# Patient Record
Sex: Female | Born: 1935 | Race: White | Hispanic: No | Smoking: Never smoker
Health system: Southern US, Community
[De-identification: ages and names within clinical notes are randomized; demographics above are authoritative.]

## PROBLEM LIST (undated history)

## (undated) DIAGNOSIS — J309 Allergic rhinitis, unspecified: Secondary | ICD-10-CM

## (undated) DIAGNOSIS — R300 Dysuria: Secondary | ICD-10-CM

## (undated) DIAGNOSIS — F329 Major depressive disorder, single episode, unspecified: Secondary | ICD-10-CM

## (undated) DIAGNOSIS — N3281 Overactive bladder: Secondary | ICD-10-CM

## (undated) DIAGNOSIS — K1379 Other lesions of oral mucosa: Secondary | ICD-10-CM

## (undated) DIAGNOSIS — F319 Bipolar disorder, unspecified: Secondary | ICD-10-CM

## (undated) DIAGNOSIS — E612 Magnesium deficiency: Secondary | ICD-10-CM

## (undated) DIAGNOSIS — L719 Rosacea, unspecified: Secondary | ICD-10-CM

## (undated) DIAGNOSIS — J189 Pneumonia, unspecified organism: Secondary | ICD-10-CM

## (undated) DIAGNOSIS — E1142 Type 2 diabetes mellitus with diabetic polyneuropathy: Secondary | ICD-10-CM

## (undated) DIAGNOSIS — E46 Unspecified protein-calorie malnutrition: Secondary | ICD-10-CM

## (undated) DIAGNOSIS — N3946 Mixed incontinence: Secondary | ICD-10-CM

## (undated) DIAGNOSIS — B379 Candidiasis, unspecified: Secondary | ICD-10-CM

## (undated) DIAGNOSIS — M1460 Charcot's joint, unspecified site: Secondary | ICD-10-CM

## (undated) DIAGNOSIS — I10 Essential (primary) hypertension: Secondary | ICD-10-CM

## (undated) DIAGNOSIS — A419 Sepsis, unspecified organism: Secondary | ICD-10-CM

## (undated) DIAGNOSIS — M6281 Muscle weakness (generalized): Secondary | ICD-10-CM

## (undated) DIAGNOSIS — K5901 Slow transit constipation: Secondary | ICD-10-CM

## (undated) DIAGNOSIS — G629 Polyneuropathy, unspecified: Secondary | ICD-10-CM

## (undated) DIAGNOSIS — E785 Hyperlipidemia, unspecified: Secondary | ICD-10-CM

## (undated) DIAGNOSIS — N390 Urinary tract infection, site not specified: Secondary | ICD-10-CM

## (undated) DIAGNOSIS — E54 Ascorbic acid deficiency: Secondary | ICD-10-CM

## (undated) DIAGNOSIS — N952 Postmenopausal atrophic vaginitis: Secondary | ICD-10-CM

## (undated) DIAGNOSIS — M792 Neuralgia and neuritis, unspecified: Secondary | ICD-10-CM

## (undated) DIAGNOSIS — K5909 Other constipation: Secondary | ICD-10-CM

## (undated) DIAGNOSIS — E569 Vitamin deficiency, unspecified: Secondary | ICD-10-CM

## (undated) DIAGNOSIS — Z794 Long term (current) use of insulin: Secondary | ICD-10-CM

## (undated) DIAGNOSIS — G20 Parkinson's disease: Secondary | ICD-10-CM

## (undated) DIAGNOSIS — G9340 Encephalopathy, unspecified: Secondary | ICD-10-CM

## (undated) DIAGNOSIS — Z9181 History of falling: Secondary | ICD-10-CM

## (undated) DIAGNOSIS — G20A1 Parkinson's disease without dyskinesia, without mention of fluctuations: Secondary | ICD-10-CM

## (undated) DIAGNOSIS — K219 Gastro-esophageal reflux disease without esophagitis: Secondary | ICD-10-CM

## (undated) DIAGNOSIS — E669 Obesity, unspecified: Secondary | ICD-10-CM

## (undated) HISTORY — DX: Charcot's joint, unspecified site: M14.60

## (undated) HISTORY — DX: Major depressive disorder, single episode, unspecified: F32.9

## (undated) HISTORY — DX: Hyperlipidemia, unspecified: E78.5

## (undated) HISTORY — DX: Morbid (severe) obesity due to excess calories: E66.01

## (undated) HISTORY — DX: Postmenopausal atrophic vaginitis: N95.2

## (undated) HISTORY — DX: Sepsis, unspecified organism: A41.9

## (undated) HISTORY — DX: Ascorbic acid deficiency: E54

## (undated) HISTORY — DX: Other constipation: K59.09

## (undated) HISTORY — DX: Gastro-esophageal reflux disease without esophagitis: K21.9

## (undated) HISTORY — DX: Neuralgia and neuritis, unspecified: M79.2

## (undated) HISTORY — DX: Muscle weakness (generalized): M62.81

## (undated) HISTORY — DX: Urinary tract infection, site not specified: N39.0

## (undated) HISTORY — DX: Type 2 diabetes mellitus with diabetic polyneuropathy: E11.42

## (undated) HISTORY — DX: Encephalopathy, unspecified: G93.40

## (undated) HISTORY — DX: Mixed incontinence: N39.46

## (undated) HISTORY — DX: Candidiasis, unspecified: B37.9

## (undated) HISTORY — DX: Essential (primary) hypertension: I10

## (undated) HISTORY — DX: History of falling: Z91.81

## (undated) HISTORY — DX: Parkinson's disease: G20

## (undated) HISTORY — DX: Pneumonia, unspecified organism: J18.9

## (undated) HISTORY — DX: Magnesium deficiency: E61.2

## (undated) HISTORY — DX: Parkinson's disease without dyskinesia, without mention of fluctuations: G20.A1

## (undated) HISTORY — DX: Polyneuropathy, unspecified: G62.9

## (undated) HISTORY — DX: Unspecified protein-calorie malnutrition: E46

## (undated) HISTORY — DX: Allergic rhinitis, unspecified: J30.9

## (undated) HISTORY — DX: Vitamin deficiency, unspecified: E56.9

## (undated) HISTORY — PX: CERVICAL CONIZATION W/BX: SHX1330

## (undated) HISTORY — DX: Obesity, unspecified: E66.9

## (undated) HISTORY — PX: CHOLECYSTECTOMY: SHX55

## (undated) HISTORY — DX: Bipolar disorder, unspecified: F31.9

## (undated) HISTORY — DX: Dysuria: R30.0

## (undated) HISTORY — PX: REVISION TOTAL KNEE ARTHROPLASTY: SHX767

## (undated) HISTORY — DX: Slow transit constipation: K59.01

## (undated) HISTORY — PX: ABDOMINAL HYSTERECTOMY: SHX81

## (undated) HISTORY — DX: Overactive bladder: N32.81

## (undated) HISTORY — DX: Other lesions of oral mucosa: K13.79

## (undated) HISTORY — DX: Long term (current) use of insulin: Z79.4

## (undated) HISTORY — DX: Rosacea, unspecified: L71.9

---

## 2005-06-10 ENCOUNTER — Ambulatory Visit: Payer: Self-pay

## 2005-09-05 ENCOUNTER — Ambulatory Visit: Payer: Self-pay | Admitting: Internal Medicine

## 2006-01-06 ENCOUNTER — Other Ambulatory Visit: Payer: Self-pay

## 2006-01-09 ENCOUNTER — Ambulatory Visit: Payer: Self-pay | Admitting: Unknown Physician Specialty

## 2006-01-31 ENCOUNTER — Inpatient Hospital Stay: Payer: Self-pay | Admitting: General Practice

## 2006-02-04 ENCOUNTER — Encounter: Payer: Self-pay | Admitting: Internal Medicine

## 2006-02-24 ENCOUNTER — Encounter: Payer: Self-pay | Admitting: Internal Medicine

## 2006-08-24 ENCOUNTER — Encounter: Payer: Self-pay | Admitting: Unknown Physician Specialty

## 2006-08-26 ENCOUNTER — Encounter: Payer: Self-pay | Admitting: Unknown Physician Specialty

## 2006-09-12 ENCOUNTER — Ambulatory Visit: Payer: Self-pay | Admitting: Internal Medicine

## 2006-09-26 ENCOUNTER — Encounter: Payer: Self-pay | Admitting: Unknown Physician Specialty

## 2006-10-27 ENCOUNTER — Encounter: Payer: Self-pay | Admitting: Unknown Physician Specialty

## 2006-11-25 ENCOUNTER — Encounter: Payer: Self-pay | Admitting: Unknown Physician Specialty

## 2007-08-27 ENCOUNTER — Ambulatory Visit: Payer: Self-pay | Admitting: Otolaryngology

## 2008-01-22 ENCOUNTER — Ambulatory Visit: Payer: Self-pay | Admitting: Internal Medicine

## 2008-06-12 ENCOUNTER — Other Ambulatory Visit: Payer: Self-pay

## 2008-06-12 ENCOUNTER — Emergency Department: Payer: Self-pay | Admitting: Emergency Medicine

## 2008-07-03 ENCOUNTER — Ambulatory Visit: Payer: Self-pay | Admitting: Internal Medicine

## 2010-03-09 ENCOUNTER — Ambulatory Visit: Payer: Self-pay | Admitting: Internal Medicine

## 2010-05-03 ENCOUNTER — Ambulatory Visit: Payer: Self-pay | Admitting: Unknown Physician Specialty

## 2010-07-12 ENCOUNTER — Ambulatory Visit: Payer: Self-pay | Admitting: Unknown Physician Specialty

## 2011-05-18 ENCOUNTER — Ambulatory Visit: Payer: Self-pay | Admitting: Internal Medicine

## 2011-07-06 ENCOUNTER — Ambulatory Visit: Payer: Self-pay | Admitting: Ophthalmology

## 2012-01-30 LAB — URINALYSIS, COMPLETE
Glucose,UR: NEGATIVE mg/dL (ref 0–75)
Ketone: NEGATIVE
Nitrite: NEGATIVE
Squamous Epithelial: 5
WBC UR: 149 /HPF (ref 0–5)

## 2012-01-30 LAB — CBC
HCT: 41.1 % (ref 35.0–47.0)
Platelet: 293 10*3/uL (ref 150–440)
RBC: 4.51 10*6/uL (ref 3.80–5.20)
WBC: 15.9 10*3/uL — ABNORMAL HIGH (ref 3.6–11.0)

## 2012-01-30 LAB — COMPREHENSIVE METABOLIC PANEL
Alkaline Phosphatase: 93 U/L (ref 50–136)
Anion Gap: 10 (ref 7–16)
BUN: 15 mg/dL (ref 7–18)
Calcium, Total: 9.2 mg/dL (ref 8.5–10.1)
Creatinine: 0.95 mg/dL (ref 0.60–1.30)
EGFR (African American): >60
EGFR (Non-African Amer.): 59 — ABNORMAL LOW
Glucose: 93 mg/dL (ref 65–99)
Osmolality: 282 (ref 275–301)
Potassium: 3.6 mmol/L (ref 3.5–5.1)
SGPT (ALT): 23 U/L
Sodium: 141 mmol/L (ref 136–145)
Total Protein: 7.6 g/dL (ref 6.4–8.2)

## 2012-01-31 ENCOUNTER — Inpatient Hospital Stay: Payer: Self-pay | Admitting: Internal Medicine

## 2012-02-01 LAB — BASIC METABOLIC PANEL
Anion Gap: 7 (ref 7–16)
Chloride: 107 mmol/L (ref 98–107)
Creatinine: 0.78 mg/dL (ref 0.60–1.30)
EGFR (African American): >60
EGFR (Non-African Amer.): >60
Osmolality: 283 (ref 275–301)

## 2012-02-01 LAB — CBC WITH DIFFERENTIAL/PLATELET
Basophil #: 0.1 10*3/uL (ref 0.0–0.1)
Basophil %: 0.4 %
Eosinophil %: 2.5 %
HGB: 12.7 g/dL (ref 12.0–16.0)
Lymphocyte #: 2.5 10*3/uL (ref 1.0–3.6)
Lymphocyte %: 19.2 %
MCH: 30.4 pg (ref 26.0–34.0)
MCHC: 33.4 g/dL (ref 32.0–36.0)
MCV: 91 fL (ref 80–100)
Monocyte #: 1.2 x10 3/mm — ABNORMAL HIGH (ref 0.2–0.9)
Neutrophil #: 9.1 10*3/uL — ABNORMAL HIGH (ref 1.4–6.5)
RBC: 4.16 10*6/uL (ref 3.80–5.20)
RDW: 13.2 % (ref 11.5–14.5)

## 2012-02-02 LAB — CBC WITH DIFFERENTIAL/PLATELET
Basophil #: 0.1 10*3/uL (ref 0.0–0.1)
Basophil %: 0.7 %
HGB: 12.4 g/dL (ref 12.0–16.0)
Lymphocyte #: 2.2 10*3/uL (ref 1.0–3.6)
Lymphocyte %: 23.6 %
MCV: 91 fL (ref 80–100)
Monocyte %: 9.7 %
Neutrophil %: 61.3 %
Platelet: 251 10*3/uL (ref 150–440)
RBC: 4.02 10*6/uL (ref 3.80–5.20)
RDW: 13.1 % (ref 11.5–14.5)
WBC: 9.2 10*3/uL (ref 3.6–11.0)

## 2012-02-05 LAB — CULTURE, BLOOD (SINGLE)

## 2012-04-11 ENCOUNTER — Ambulatory Visit: Payer: Self-pay | Admitting: Ophthalmology

## 2012-06-04 DIAGNOSIS — R6889 Other general symptoms and signs: Secondary | ICD-10-CM | POA: Diagnosis not present

## 2012-06-05 ENCOUNTER — Inpatient Hospital Stay: Payer: Self-pay | Admitting: Internal Medicine

## 2012-06-05 DIAGNOSIS — G9340 Encephalopathy, unspecified: Secondary | ICD-10-CM | POA: Diagnosis not present

## 2012-06-05 DIAGNOSIS — G219 Secondary parkinsonism, unspecified: Secondary | ICD-10-CM | POA: Diagnosis not present

## 2012-06-05 LAB — COMPREHENSIVE METABOLIC PANEL
Anion Gap: 8 (ref 7–16)
BUN: 14 mg/dL (ref 7–18)
Calcium, Total: 9 mg/dL (ref 8.5–10.1)
Chloride: 102 mmol/L (ref 98–107)
Co2: 26 mmol/L (ref 21–32)
EGFR (African American): >60
EGFR (Non-African Amer.): >60
Osmolality: 284 (ref 275–301)
Potassium: 3.9 mmol/L (ref 3.5–5.1)
SGPT (ALT): 25 U/L (ref 12–78)
Sodium: 136 mmol/L (ref 136–145)

## 2012-06-05 LAB — URINALYSIS, COMPLETE
Bilirubin,UR: NEGATIVE
Nitrite: POSITIVE
Ph: 5 (ref 4.5–8.0)
Protein: 30
RBC,UR: 11 /HPF (ref 0–5)
Specific Gravity: 1.022 (ref 1.003–1.030)
WBC UR: 222 /HPF (ref 0–5)

## 2012-06-05 LAB — CBC
HCT: 42.5 % (ref 35.0–47.0)
MCV: 90 fL (ref 80–100)
Platelet: 214 10*3/uL (ref 150–440)
RBC: 4.73 10*6/uL (ref 3.80–5.20)
RDW: 13.4 % (ref 11.5–14.5)
WBC: 11.4 10*3/uL — ABNORMAL HIGH (ref 3.6–11.0)

## 2012-06-05 LAB — CK TOTAL AND CKMB (NOT AT ARMC): CK-MB: <0.5 ng/mL — ABNORMAL LOW (ref 0.5–3.6)

## 2012-06-05 LAB — DRUG SCREEN, URINE
Benzodiazepine, Ur Scrn: NEGATIVE (ref ?–200)
Cannabinoid 50 Ng, Ur ~~LOC~~: NEGATIVE (ref ?–50)
Cocaine Metabolite,Ur ~~LOC~~: NEGATIVE (ref ?–300)
Methadone, Ur Screen: NEGATIVE (ref ?–300)
Phencyclidine (PCP) Ur S: NEGATIVE (ref ?–25)

## 2012-06-05 LAB — TROPONIN I: Troponin-I: <0.02 ng/mL

## 2012-06-05 LAB — PROTIME-INR
INR: 1
Prothrombin Time: 13.4 secs (ref 11.5–14.7)

## 2012-06-05 LAB — AMMONIA: Ammonia, Plasma: <25 mcmol/L (ref 11–32)

## 2012-06-05 LAB — SEDIMENTATION RATE: Erythrocyte Sed Rate: 17 mm/hr (ref 0–30)

## 2012-06-05 LAB — ETHANOL: Ethanol %: <0.003 % (ref 0.000–0.080)

## 2012-06-05 LAB — TSH: Thyroid Stimulating Horm: 0.732 u[IU]/mL

## 2012-06-05 LAB — FOLATE: Folic Acid: 16.8 ng/mL (ref 3.1–100.0)

## 2012-06-06 DIAGNOSIS — G9340 Encephalopathy, unspecified: Secondary | ICD-10-CM | POA: Diagnosis not present

## 2012-06-06 DIAGNOSIS — G219 Secondary parkinsonism, unspecified: Secondary | ICD-10-CM | POA: Diagnosis not present

## 2012-06-06 LAB — CBC WITH DIFFERENTIAL/PLATELET
Basophil #: 0.1 10*3/uL (ref 0.0–0.1)
Basophil %: 0.7 %
Eosinophil #: 0.2 10*3/uL (ref 0.0–0.7)
Eosinophil %: 1.9 %
HCT: 42.1 % (ref 35.0–47.0)
HGB: 14.6 g/dL (ref 12.0–16.0)
Lymphocyte #: 2.1 10*3/uL (ref 1.0–3.6)
Lymphocyte %: 20.3 %
MCH: 31 pg (ref 26.0–34.0)
Monocyte #: 1.5 x10 3/mm — ABNORMAL HIGH (ref 0.2–0.9)
Monocyte %: 14.6 %
Neutrophil %: 62.5 %
Platelet: 224 10*3/uL (ref 150–440)
RBC: 4.71 10*6/uL (ref 3.80–5.20)
RDW: 13.6 % (ref 11.5–14.5)
WBC: 10.4 10*3/uL (ref 3.6–11.0)

## 2012-06-06 LAB — BASIC METABOLIC PANEL
Anion Gap: 10 (ref 7–16)
BUN: 16 mg/dL (ref 7–18)
Calcium, Total: 8.8 mg/dL (ref 8.5–10.1)
EGFR (Non-African Amer.): >60
Glucose: 232 mg/dL — ABNORMAL HIGH (ref 65–99)
Osmolality: 284 (ref 275–301)
Potassium: 3.5 mmol/L (ref 3.5–5.1)

## 2012-06-07 DIAGNOSIS — G219 Secondary parkinsonism, unspecified: Secondary | ICD-10-CM | POA: Diagnosis not present

## 2012-06-07 DIAGNOSIS — G9340 Encephalopathy, unspecified: Secondary | ICD-10-CM | POA: Diagnosis not present

## 2012-06-07 LAB — URINE CULTURE

## 2012-06-10 LAB — CULTURE, BLOOD (SINGLE)

## 2012-08-28 ENCOUNTER — Ambulatory Visit: Payer: Self-pay | Admitting: Ophthalmology

## 2012-12-27 ENCOUNTER — Ambulatory Visit: Payer: Self-pay | Admitting: General Practice

## 2013-03-22 ENCOUNTER — Inpatient Hospital Stay: Payer: Self-pay | Admitting: Internal Medicine

## 2013-03-22 DIAGNOSIS — R6889 Other general symptoms and signs: Secondary | ICD-10-CM | POA: Diagnosis not present

## 2013-03-22 LAB — COMPREHENSIVE METABOLIC PANEL
Anion Gap: 12 (ref 7–16)
Bilirubin,Total: 0.9 mg/dL (ref 0.2–1.0)
Calcium, Total: 9.7 mg/dL (ref 8.5–10.1)
Creatinine: 0.72 mg/dL (ref 0.60–1.30)
EGFR (African American): >60
EGFR (Non-African Amer.): >60
SGOT(AST): 96 U/L — ABNORMAL HIGH (ref 15–37)
SGPT (ALT): 51 U/L (ref 12–78)
Sodium: 143 mmol/L (ref 136–145)
Total Protein: 7.2 g/dL (ref 6.4–8.2)

## 2013-03-22 LAB — TROPONIN I: Troponin-I: <0.02 ng/mL

## 2013-03-22 LAB — DRUG SCREEN, URINE
Amphetamines, Ur Screen: NEGATIVE (ref ?–1000)
Cannabinoid 50 Ng, Ur ~~LOC~~: NEGATIVE (ref ?–50)
Cocaine Metabolite,Ur ~~LOC~~: NEGATIVE (ref ?–300)
MDMA (Ecstasy)Ur Screen: NEGATIVE (ref ?–500)
Methadone, Ur Screen: NEGATIVE (ref ?–300)
Opiate, Ur Screen: NEGATIVE (ref ?–300)
Phencyclidine (PCP) Ur S: NEGATIVE (ref ?–25)

## 2013-03-22 LAB — CBC WITH DIFFERENTIAL/PLATELET
Basophil #: 0.2 10*3/uL — ABNORMAL HIGH (ref 0.0–0.1)
Eosinophil #: 0.1 10*3/uL (ref 0.0–0.7)
Eosinophil %: 0.7 %
HCT: 47.9 % — ABNORMAL HIGH (ref 35.0–47.0)
Lymphocyte #: 1.9 10*3/uL (ref 1.0–3.6)
Lymphocyte %: 11.7 %
MCH: 30.3 pg (ref 26.0–34.0)
MCHC: 33.4 g/dL (ref 32.0–36.0)
MCV: 91 fL (ref 80–100)
Monocyte %: 10.5 %
Neutrophil #: 12.7 10*3/uL — ABNORMAL HIGH (ref 1.4–6.5)
Neutrophil %: 76.2 %
Platelet: 340 10*3/uL (ref 150–440)
RBC: 5.29 10*6/uL — ABNORMAL HIGH (ref 3.80–5.20)

## 2013-03-22 LAB — URINALYSIS, COMPLETE
Ph: 5 (ref 4.5–8.0)
Protein: NEGATIVE
Squamous Epithelial: 2
WBC UR: 8 /HPF (ref 0–5)

## 2013-03-22 LAB — CK TOTAL AND CKMB (NOT AT ARMC): CK-MB: 10 ng/mL — ABNORMAL HIGH (ref 0.5–3.6)

## 2013-03-22 LAB — ETHANOL
Ethanol %: <0.003 % (ref 0.000–0.080)
Ethanol: <3 mg/dL

## 2013-03-22 LAB — APTT: Activated PTT: 28 secs (ref 23.6–35.9)

## 2013-03-22 LAB — AMMONIA: Ammonia, Plasma: 45 mcmol/L — ABNORMAL HIGH (ref 11–32)

## 2013-03-23 LAB — COMPREHENSIVE METABOLIC PANEL
Albumin: 2.8 g/dL — ABNORMAL LOW (ref 3.4–5.0)
Alkaline Phosphatase: 104 U/L (ref 50–136)
Anion Gap: 6 — ABNORMAL LOW (ref 7–16)
BUN: 18 mg/dL (ref 7–18)
Bilirubin,Total: 0.7 mg/dL (ref 0.2–1.0)
Calcium, Total: 8.6 mg/dL (ref 8.5–10.1)
Co2: 29 mmol/L (ref 21–32)
Creatinine: 0.77 mg/dL (ref 0.60–1.30)
Osmolality: 299 (ref 275–301)
Total Protein: 6.6 g/dL (ref 6.4–8.2)

## 2013-03-23 LAB — CK TOTAL AND CKMB (NOT AT ARMC)
CK, Total: 1581 U/L — ABNORMAL HIGH (ref 21–215)
CK-MB: 6 ng/mL — ABNORMAL HIGH (ref 0.5–3.6)
CK-MB: 9.3 ng/mL — ABNORMAL HIGH (ref 0.5–3.6)

## 2013-03-23 LAB — CBC WITH DIFFERENTIAL/PLATELET
Basophil #: 0.2 10*3/uL — ABNORMAL HIGH (ref 0.0–0.1)
Basophil %: 1.4 %
Eosinophil #: 0.2 10*3/uL (ref 0.0–0.7)
Eosinophil %: 1 %
HCT: 43.7 % (ref 35.0–47.0)
HGB: 14.7 g/dL (ref 12.0–16.0)
Lymphocyte #: 2.2 10*3/uL (ref 1.0–3.6)
Lymphocyte %: 13.5 %
MCH: 30.4 pg (ref 26.0–34.0)
MCHC: 33.8 g/dL (ref 32.0–36.0)
MCV: 90 fL (ref 80–100)
Monocyte %: 11.1 %
Neutrophil #: 12 10*3/uL — ABNORMAL HIGH (ref 1.4–6.5)
Neutrophil %: 73 %
Platelet: 289 10*3/uL (ref 150–440)
RBC: 4.85 10*6/uL (ref 3.80–5.20)

## 2013-03-23 LAB — TROPONIN I
Troponin-I: <0.02 ng/mL
Troponin-I: <0.02 ng/mL

## 2013-03-23 LAB — TSH: Thyroid Stimulating Horm: 0.864 u[IU]/mL

## 2013-03-23 LAB — MAGNESIUM: Magnesium: 1.8 mg/dL

## 2013-03-23 LAB — HEMOGLOBIN A1C: Hemoglobin A1C: 9 % — ABNORMAL HIGH (ref 4.2–6.3)

## 2013-03-24 LAB — COMPREHENSIVE METABOLIC PANEL
Albumin: 2.6 g/dL — ABNORMAL LOW (ref 3.4–5.0)
Anion Gap: 8 (ref 7–16)
BUN: 18 mg/dL (ref 7–18)
Bilirubin,Total: 0.5 mg/dL (ref 0.2–1.0)
Calcium, Total: 9 mg/dL (ref 8.5–10.1)
Chloride: 103 mmol/L (ref 98–107)
Creatinine: 0.83 mg/dL (ref 0.60–1.30)
EGFR (African American): >60
Glucose: 207 mg/dL — ABNORMAL HIGH (ref 65–99)
Osmolality: 287 (ref 275–301)
Potassium: 3.2 mmol/L — ABNORMAL LOW (ref 3.5–5.1)
SGOT(AST): 41 U/L — ABNORMAL HIGH (ref 15–37)
Total Protein: 6.1 g/dL — ABNORMAL LOW (ref 6.4–8.2)

## 2013-03-24 LAB — CBC WITH DIFFERENTIAL/PLATELET
Basophil #: 0.1 10*3/uL (ref 0.0–0.1)
Basophil %: 0.9 %
Eosinophil %: 4.4 %
HCT: 40.4 % (ref 35.0–47.0)
HGB: 13.7 g/dL (ref 12.0–16.0)
Lymphocyte #: 2.1 10*3/uL (ref 1.0–3.6)
MCHC: 33.9 g/dL (ref 32.0–36.0)
Monocyte #: 1.5 x10 3/mm — ABNORMAL HIGH (ref 0.2–0.9)
Neutrophil #: 9.5 10*3/uL — ABNORMAL HIGH (ref 1.4–6.5)
Platelet: 266 10*3/uL (ref 150–440)
RBC: 4.52 10*6/uL (ref 3.80–5.20)

## 2013-03-24 LAB — CK: CK, Total: 713 U/L — ABNORMAL HIGH (ref 21–215)

## 2013-03-25 LAB — BASIC METABOLIC PANEL
Anion Gap: 7 (ref 7–16)
BUN: 13 mg/dL (ref 7–18)
EGFR (African American): >60
Osmolality: 285 (ref 275–301)
Potassium: 3.3 mmol/L — ABNORMAL LOW (ref 3.5–5.1)
Sodium: 141 mmol/L (ref 136–145)

## 2013-03-25 LAB — URINE CULTURE

## 2013-03-26 LAB — CBC WITH DIFFERENTIAL/PLATELET
Basophil %: 0.3 %
HGB: 13.9 g/dL (ref 12.0–16.0)
Lymphocyte #: 2.6 10*3/uL (ref 1.0–3.6)
Lymphocyte %: 21.8 %
MCH: 31 pg (ref 26.0–34.0)
MCHC: 35 g/dL (ref 32.0–36.0)
Monocyte #: 1.1 x10 3/mm — ABNORMAL HIGH (ref 0.2–0.9)
Monocyte %: 9.7 %
Neutrophil %: 62.6 %
Platelet: 283 10*3/uL (ref 150–440)
RDW: 13 % (ref 11.5–14.5)

## 2013-03-26 LAB — URINE CULTURE

## 2013-03-27 DIAGNOSIS — R6889 Other general symptoms and signs: Secondary | ICD-10-CM | POA: Diagnosis not present

## 2013-03-27 LAB — BASIC METABOLIC PANEL
Anion Gap: 6 — ABNORMAL LOW (ref 7–16)
BUN: 17 mg/dL (ref 7–18)
Calcium, Total: 9.3 mg/dL (ref 8.5–10.1)
Co2: 29 mmol/L (ref 21–32)
Creatinine: 0.71 mg/dL (ref 0.60–1.30)
EGFR (Non-African Amer.): >60
Osmolality: 285 (ref 275–301)
Potassium: 3.8 mmol/L (ref 3.5–5.1)

## 2013-03-27 LAB — CULTURE, BLOOD (SINGLE)

## 2013-03-28 DIAGNOSIS — Z0000 Encounter for general adult medical examination without abnormal findings: Secondary | ICD-10-CM | POA: Diagnosis not present

## 2013-03-28 LAB — CULTURE, BLOOD (SINGLE)

## 2013-03-31 LAB — CULTURE, BLOOD (SINGLE)

## 2013-04-26 ENCOUNTER — Encounter (HOSPITAL_BASED_OUTPATIENT_CLINIC_OR_DEPARTMENT_OTHER): Payer: Medicare Other | Attending: General Surgery

## 2013-04-26 DIAGNOSIS — L8994 Pressure ulcer of unspecified site, stage 4: Secondary | ICD-10-CM | POA: Insufficient documentation

## 2013-04-26 DIAGNOSIS — I10 Essential (primary) hypertension: Secondary | ICD-10-CM | POA: Insufficient documentation

## 2013-04-26 DIAGNOSIS — E119 Type 2 diabetes mellitus without complications: Secondary | ICD-10-CM | POA: Insufficient documentation

## 2013-04-26 DIAGNOSIS — Z794 Long term (current) use of insulin: Secondary | ICD-10-CM | POA: Insufficient documentation

## 2013-04-26 DIAGNOSIS — L89109 Pressure ulcer of unspecified part of back, unspecified stage: Secondary | ICD-10-CM | POA: Insufficient documentation

## 2013-04-26 LAB — GLUCOSE, CAPILLARY

## 2013-04-27 NOTE — Progress Notes (Signed)
[facility]  NAME:  Maureen Taylor, Maureen Taylor               ACCOUNT NO.:  [id number]  MEDICAL RECORD NO.:  [id number]      DATE OF BIRTH:  1935-01-23  PHYSICIAN:  Ardath Sax, M.D.           VISIT DATE:                                  OFFICE VISIT   [name] is coming to the [facility] for the first time because of a unfortunate pressure ulcer on her sacrum.  This lady is 78 years of age and weighs about 300 pounds.  Apparently, she fell in the shower and hit her head and ended up laying in the shower for 4 days before she was discovered.  She was later taken to the emergency room and then admitted to the hospital for another 4-5 days.  During that time, she was worked up and apparently they did not find any neurologic problems.  However, it is obvious she has a very large stage IV pressure sore on her sacrum. I debrided it today and I felt like it needed some Santyl, so we put in some Santyl and I am ordering her to have a wound VAC as I think this is the only hope to make this situation better in terms of the size.  I can put my whole fist into this pressure ulcer and it goes right down to the presacral fascia.  She had normal vital signs; however, she is afebrile.  Her blood pressure is 137/74, pulse 84.  She says she weighs 240 pounds and is 5 foot and 4 inches.  Her blood sugar today was 148.  The Medicines that she takes are glipizide, Cymbalta, Claritin, losartan, metoprolol, vitamins, NovoLog insulin, Protonix, Sinemet, vitamins, and Ambien for sleep.  So, her diagnoses are 1. Severe pressure sore, stage IV of the sacrum. 2. Type 2 diabetes. 3. Hypertension. 4. Morbid obesity.     Ardath Sax, M.D.     PP/MEDQ  D:  04/26/2013  T:  04/27/2013  Job:  [id number]

## 2013-04-27 NOTE — Progress Notes (Signed)
[facility]  NAME:  Maureen, Taylor               ACCOUNT NO.:  [id number]  MEDICAL RECORD NO.:  [id number]      DATE OF BIRTH:  10-May-1935  PHYSICIAN:  Ardath Sax, M.D.           VISIT DATE:                                  OFFICE VISIT   This is a 78 year old lady who  Dictation ended at this point.     Ardath Sax, M.D.     PP/MEDQ  D:  04/26/2013  T:  04/27/2013  Job:  [id number]

## 2013-05-27 DIAGNOSIS — R488 Other symbolic dysfunctions: Secondary | ICD-10-CM | POA: Diagnosis not present

## 2013-05-27 DIAGNOSIS — H905 Unspecified sensorineural hearing loss: Secondary | ICD-10-CM | POA: Diagnosis not present

## 2013-05-27 DIAGNOSIS — I10 Essential (primary) hypertension: Secondary | ICD-10-CM | POA: Diagnosis not present

## 2013-05-27 DIAGNOSIS — M6282 Rhabdomyolysis: Secondary | ICD-10-CM | POA: Diagnosis not present

## 2013-05-27 DIAGNOSIS — L89109 Pressure ulcer of unspecified part of back, unspecified stage: Secondary | ICD-10-CM | POA: Diagnosis not present

## 2013-05-27 DIAGNOSIS — R1310 Dysphagia, unspecified: Secondary | ICD-10-CM | POA: Diagnosis not present

## 2013-05-27 DIAGNOSIS — L8994 Pressure ulcer of unspecified site, stage 4: Secondary | ICD-10-CM | POA: Diagnosis not present

## 2013-05-27 DIAGNOSIS — E119 Type 2 diabetes mellitus without complications: Secondary | ICD-10-CM | POA: Diagnosis not present

## 2013-05-27 DIAGNOSIS — M6281 Muscle weakness (generalized): Secondary | ICD-10-CM | POA: Diagnosis not present

## 2013-05-27 DIAGNOSIS — M259 Joint disorder, unspecified: Secondary | ICD-10-CM | POA: Diagnosis not present

## 2013-05-27 DIAGNOSIS — K219 Gastro-esophageal reflux disease without esophagitis: Secondary | ICD-10-CM | POA: Diagnosis not present

## 2013-05-27 DIAGNOSIS — R262 Difficulty in walking, not elsewhere classified: Secondary | ICD-10-CM | POA: Diagnosis not present

## 2013-05-27 DIAGNOSIS — N390 Urinary tract infection, site not specified: Secondary | ICD-10-CM | POA: Diagnosis not present

## 2013-05-27 DIAGNOSIS — H6120 Impacted cerumen, unspecified ear: Secondary | ICD-10-CM | POA: Diagnosis not present

## 2013-05-29 DIAGNOSIS — E119 Type 2 diabetes mellitus without complications: Secondary | ICD-10-CM | POA: Diagnosis not present

## 2013-05-29 DIAGNOSIS — L89109 Pressure ulcer of unspecified part of back, unspecified stage: Secondary | ICD-10-CM | POA: Diagnosis not present

## 2013-05-29 DIAGNOSIS — L8994 Pressure ulcer of unspecified site, stage 4: Secondary | ICD-10-CM | POA: Diagnosis not present

## 2013-06-05 DIAGNOSIS — E119 Type 2 diabetes mellitus without complications: Secondary | ICD-10-CM | POA: Diagnosis not present

## 2013-06-05 DIAGNOSIS — L89109 Pressure ulcer of unspecified part of back, unspecified stage: Secondary | ICD-10-CM | POA: Diagnosis not present

## 2013-06-05 DIAGNOSIS — L8994 Pressure ulcer of unspecified site, stage 4: Secondary | ICD-10-CM | POA: Diagnosis not present

## 2013-06-06 DIAGNOSIS — E119 Type 2 diabetes mellitus without complications: Secondary | ICD-10-CM | POA: Diagnosis not present

## 2013-06-06 DIAGNOSIS — K219 Gastro-esophageal reflux disease without esophagitis: Secondary | ICD-10-CM | POA: Diagnosis not present

## 2013-06-06 DIAGNOSIS — M259 Joint disorder, unspecified: Secondary | ICD-10-CM | POA: Diagnosis not present

## 2013-06-06 DIAGNOSIS — I10 Essential (primary) hypertension: Secondary | ICD-10-CM | POA: Diagnosis not present

## 2013-06-12 DIAGNOSIS — E119 Type 2 diabetes mellitus without complications: Secondary | ICD-10-CM | POA: Diagnosis not present

## 2013-06-12 DIAGNOSIS — L8994 Pressure ulcer of unspecified site, stage 4: Secondary | ICD-10-CM | POA: Diagnosis not present

## 2013-06-12 DIAGNOSIS — L89109 Pressure ulcer of unspecified part of back, unspecified stage: Secondary | ICD-10-CM | POA: Diagnosis not present

## 2013-06-24 DIAGNOSIS — E119 Type 2 diabetes mellitus without complications: Secondary | ICD-10-CM | POA: Diagnosis not present

## 2013-06-24 DIAGNOSIS — I10 Essential (primary) hypertension: Secondary | ICD-10-CM | POA: Diagnosis not present

## 2013-06-24 DIAGNOSIS — M259 Joint disorder, unspecified: Secondary | ICD-10-CM | POA: Diagnosis not present

## 2013-06-24 DIAGNOSIS — K219 Gastro-esophageal reflux disease without esophagitis: Secondary | ICD-10-CM | POA: Diagnosis not present

## 2013-06-26 DIAGNOSIS — L89109 Pressure ulcer of unspecified part of back, unspecified stage: Secondary | ICD-10-CM | POA: Diagnosis not present

## 2013-06-26 DIAGNOSIS — L8994 Pressure ulcer of unspecified site, stage 4: Secondary | ICD-10-CM | POA: Diagnosis not present

## 2013-06-26 DIAGNOSIS — E119 Type 2 diabetes mellitus without complications: Secondary | ICD-10-CM | POA: Diagnosis not present

## 2013-07-03 DIAGNOSIS — L8994 Pressure ulcer of unspecified site, stage 4: Secondary | ICD-10-CM | POA: Diagnosis not present

## 2013-07-03 DIAGNOSIS — E119 Type 2 diabetes mellitus without complications: Secondary | ICD-10-CM | POA: Diagnosis not present

## 2013-07-03 DIAGNOSIS — L89109 Pressure ulcer of unspecified part of back, unspecified stage: Secondary | ICD-10-CM | POA: Diagnosis not present

## 2013-07-15 DIAGNOSIS — E119 Type 2 diabetes mellitus without complications: Secondary | ICD-10-CM | POA: Diagnosis not present

## 2013-07-15 DIAGNOSIS — L89109 Pressure ulcer of unspecified part of back, unspecified stage: Secondary | ICD-10-CM | POA: Diagnosis not present

## 2013-07-15 DIAGNOSIS — L8994 Pressure ulcer of unspecified site, stage 4: Secondary | ICD-10-CM | POA: Diagnosis not present

## 2013-07-20 DIAGNOSIS — H905 Unspecified sensorineural hearing loss: Secondary | ICD-10-CM | POA: Diagnosis not present

## 2013-07-20 DIAGNOSIS — H6120 Impacted cerumen, unspecified ear: Secondary | ICD-10-CM | POA: Diagnosis not present

## 2013-07-22 DIAGNOSIS — L89109 Pressure ulcer of unspecified part of back, unspecified stage: Secondary | ICD-10-CM | POA: Diagnosis not present

## 2013-07-22 DIAGNOSIS — L8994 Pressure ulcer of unspecified site, stage 4: Secondary | ICD-10-CM | POA: Diagnosis not present

## 2013-07-22 DIAGNOSIS — E119 Type 2 diabetes mellitus without complications: Secondary | ICD-10-CM | POA: Diagnosis not present

## 2013-07-30 DIAGNOSIS — M259 Joint disorder, unspecified: Secondary | ICD-10-CM | POA: Diagnosis not present

## 2013-07-30 DIAGNOSIS — E119 Type 2 diabetes mellitus without complications: Secondary | ICD-10-CM | POA: Diagnosis not present

## 2013-07-30 DIAGNOSIS — K219 Gastro-esophageal reflux disease without esophagitis: Secondary | ICD-10-CM | POA: Diagnosis not present

## 2013-07-30 DIAGNOSIS — I10 Essential (primary) hypertension: Secondary | ICD-10-CM | POA: Diagnosis not present

## 2013-08-05 DIAGNOSIS — L89109 Pressure ulcer of unspecified part of back, unspecified stage: Secondary | ICD-10-CM | POA: Diagnosis not present

## 2013-08-05 DIAGNOSIS — E119 Type 2 diabetes mellitus without complications: Secondary | ICD-10-CM | POA: Diagnosis not present

## 2013-08-05 DIAGNOSIS — L8994 Pressure ulcer of unspecified site, stage 4: Secondary | ICD-10-CM | POA: Diagnosis not present

## 2013-09-10 DIAGNOSIS — J00 Acute nasopharyngitis [common cold]: Secondary | ICD-10-CM | POA: Diagnosis not present

## 2013-09-10 DIAGNOSIS — J309 Allergic rhinitis, unspecified: Secondary | ICD-10-CM | POA: Diagnosis not present

## 2013-09-10 DIAGNOSIS — J0100 Acute maxillary sinusitis, unspecified: Secondary | ICD-10-CM | POA: Diagnosis not present

## 2013-09-12 DIAGNOSIS — J019 Acute sinusitis, unspecified: Secondary | ICD-10-CM | POA: Diagnosis not present

## 2013-09-12 DIAGNOSIS — J3489 Other specified disorders of nose and nasal sinuses: Secondary | ICD-10-CM | POA: Diagnosis not present

## 2013-09-12 DIAGNOSIS — J329 Chronic sinusitis, unspecified: Secondary | ICD-10-CM | POA: Diagnosis not present

## 2013-09-13 DIAGNOSIS — N390 Urinary tract infection, site not specified: Secondary | ICD-10-CM | POA: Diagnosis not present

## 2013-09-13 DIAGNOSIS — E119 Type 2 diabetes mellitus without complications: Secondary | ICD-10-CM | POA: Diagnosis not present

## 2013-09-23 DIAGNOSIS — R32 Unspecified urinary incontinence: Secondary | ICD-10-CM | POA: Diagnosis not present

## 2013-10-01 DIAGNOSIS — F341 Dysthymic disorder: Secondary | ICD-10-CM | POA: Diagnosis not present

## 2013-10-07 DIAGNOSIS — A419 Sepsis, unspecified organism: Secondary | ICD-10-CM | POA: Diagnosis not present

## 2013-10-07 DIAGNOSIS — I10 Essential (primary) hypertension: Secondary | ICD-10-CM | POA: Diagnosis not present

## 2013-10-07 DIAGNOSIS — E119 Type 2 diabetes mellitus without complications: Secondary | ICD-10-CM | POA: Diagnosis not present

## 2013-10-07 DIAGNOSIS — E785 Hyperlipidemia, unspecified: Secondary | ICD-10-CM | POA: Diagnosis not present

## 2013-10-08 DIAGNOSIS — F341 Dysthymic disorder: Secondary | ICD-10-CM | POA: Diagnosis not present

## 2013-10-12 DIAGNOSIS — R32 Unspecified urinary incontinence: Secondary | ICD-10-CM | POA: Diagnosis not present

## 2013-10-15 DIAGNOSIS — F341 Dysthymic disorder: Secondary | ICD-10-CM | POA: Diagnosis not present

## 2013-10-22 DIAGNOSIS — F341 Dysthymic disorder: Secondary | ICD-10-CM | POA: Diagnosis not present

## 2013-10-22 DIAGNOSIS — N390 Urinary tract infection, site not specified: Secondary | ICD-10-CM | POA: Diagnosis not present

## 2013-10-29 DIAGNOSIS — F341 Dysthymic disorder: Secondary | ICD-10-CM | POA: Diagnosis not present

## 2014-01-20 DIAGNOSIS — N318 Other neuromuscular dysfunction of bladder: Secondary | ICD-10-CM | POA: Diagnosis not present

## 2014-01-20 DIAGNOSIS — N393 Stress incontinence (female) (male): Secondary | ICD-10-CM | POA: Diagnosis not present

## 2014-01-21 DIAGNOSIS — I498 Other specified cardiac arrhythmias: Secondary | ICD-10-CM | POA: Diagnosis not present

## 2014-01-21 DIAGNOSIS — I999 Unspecified disorder of circulatory system: Secondary | ICD-10-CM | POA: Diagnosis not present

## 2014-01-21 DIAGNOSIS — R7989 Other specified abnormal findings of blood chemistry: Secondary | ICD-10-CM | POA: Diagnosis not present

## 2014-01-21 DIAGNOSIS — R509 Fever, unspecified: Secondary | ICD-10-CM | POA: Diagnosis not present

## 2014-03-21 DIAGNOSIS — N318 Other neuromuscular dysfunction of bladder: Secondary | ICD-10-CM | POA: Diagnosis not present

## 2014-03-21 DIAGNOSIS — E119 Type 2 diabetes mellitus without complications: Secondary | ICD-10-CM | POA: Diagnosis not present

## 2014-03-21 DIAGNOSIS — J309 Allergic rhinitis, unspecified: Secondary | ICD-10-CM | POA: Diagnosis not present

## 2014-03-21 DIAGNOSIS — F329 Major depressive disorder, single episode, unspecified: Secondary | ICD-10-CM | POA: Diagnosis not present

## 2014-03-21 DIAGNOSIS — F3289 Other specified depressive episodes: Secondary | ICD-10-CM | POA: Diagnosis not present

## 2014-03-21 DIAGNOSIS — G20 Parkinson's disease: Secondary | ICD-10-CM | POA: Diagnosis not present

## 2014-03-21 DIAGNOSIS — K219 Gastro-esophageal reflux disease without esophagitis: Secondary | ICD-10-CM | POA: Diagnosis not present

## 2014-03-21 DIAGNOSIS — I10 Essential (primary) hypertension: Secondary | ICD-10-CM | POA: Diagnosis not present

## 2014-03-27 ENCOUNTER — Other Ambulatory Visit: Payer: Self-pay

## 2014-03-27 LAB — URINALYSIS, COMPLETE
BLOOD: NEGATIVE
Bilirubin,UR: NEGATIVE
NITRITE: NEGATIVE
Ph: 5 (ref 4.5–8.0)
RBC,UR: 14 /HPF (ref 0–5)
Specific Gravity: 1.011 (ref 1.003–1.030)
Squamous Epithelial: 2
Transitional Epi: 2

## 2014-03-30 LAB — URINE CULTURE

## 2014-04-02 DIAGNOSIS — R4182 Altered mental status, unspecified: Secondary | ICD-10-CM | POA: Diagnosis not present

## 2014-05-27 ENCOUNTER — Other Ambulatory Visit: Payer: Self-pay | Admitting: Family Medicine

## 2014-05-27 LAB — URINALYSIS, COMPLETE
BILIRUBIN, UR: NEGATIVE
Blood: NEGATIVE
Ketone: NEGATIVE
Nitrite: NEGATIVE
Ph: 6 (ref 4.5–8.0)
Protein: 30
RBC,UR: 6 /HPF (ref 0–5)
Specific Gravity: 1.012 (ref 1.003–1.030)
Squamous Epithelial: 2

## 2014-05-27 LAB — CBC WITH DIFFERENTIAL/PLATELET
BASOS ABS: 0.1 10*3/uL (ref 0.0–0.1)
Basophil %: 0.7 %
EOS PCT: 1.2 %
Eosinophil #: 0.2 10*3/uL (ref 0.0–0.7)
HCT: 47.3 % — AB (ref 35.0–47.0)
HGB: 15.1 g/dL (ref 12.0–16.0)
LYMPHS PCT: 10.1 %
Lymphocyte #: 1.5 10*3/uL (ref 1.0–3.6)
MCH: 30.2 pg (ref 26.0–34.0)
MCHC: 31.8 g/dL — AB (ref 32.0–36.0)
MCV: 95 fL (ref 80–100)
MONOS PCT: 5.9 %
Monocyte #: 0.9 x10 3/mm (ref 0.2–0.9)
NEUTROS ABS: 12.6 10*3/uL — AB (ref 1.4–6.5)
Neutrophil %: 82.1 %
Platelet: 256 10*3/uL (ref 150–440)
RBC: 4.98 10*6/uL (ref 3.80–5.20)
RDW: 13.5 % (ref 11.5–14.5)
WBC: 15.3 10*3/uL — AB (ref 3.6–11.0)

## 2014-05-27 LAB — TSH: Thyroid Stimulating Horm: 0.905 u[IU]/mL

## 2014-05-27 LAB — COMPREHENSIVE METABOLIC PANEL
ALK PHOS: 120 U/L — AB
Albumin: 2.9 g/dL — ABNORMAL LOW (ref 3.4–5.0)
Anion Gap: 9 (ref 7–16)
BUN: 8 mg/dL (ref 7–18)
Bilirubin,Total: 0.7 mg/dL (ref 0.2–1.0)
CALCIUM: 9.1 mg/dL (ref 8.5–10.1)
Chloride: 101 mmol/L (ref 98–107)
Co2: 26 mmol/L (ref 21–32)
Creatinine: 0.91 mg/dL (ref 0.60–1.30)
EGFR (African American): >60
EGFR (Non-African Amer.): >60
GLUCOSE: 276 mg/dL — AB (ref 65–99)
Osmolality: 280 (ref 275–301)
POTASSIUM: 3.9 mmol/L (ref 3.5–5.1)
SGOT(AST): 13 U/L — ABNORMAL LOW (ref 15–37)
SGPT (ALT): 8 U/L — ABNORMAL LOW
SODIUM: 136 mmol/L (ref 136–145)
Total Protein: 6.6 g/dL (ref 6.4–8.2)

## 2014-05-29 LAB — URINE CULTURE

## 2014-05-30 ENCOUNTER — Inpatient Hospital Stay: Payer: Self-pay | Admitting: Internal Medicine

## 2014-05-30 DIAGNOSIS — R5381 Other malaise: Secondary | ICD-10-CM | POA: Diagnosis not present

## 2014-05-30 DIAGNOSIS — F919 Conduct disorder, unspecified: Secondary | ICD-10-CM | POA: Diagnosis not present

## 2014-05-30 LAB — COMPREHENSIVE METABOLIC PANEL
ALK PHOS: 119 U/L — AB
Albumin: 2.7 g/dL — ABNORMAL LOW (ref 3.4–5.0)
Anion Gap: 8 (ref 7–16)
BILIRUBIN TOTAL: 0.7 mg/dL (ref 0.2–1.0)
BUN: 11 mg/dL (ref 7–18)
CALCIUM: 9.1 mg/dL (ref 8.5–10.1)
CHLORIDE: 102 mmol/L (ref 98–107)
Co2: 25 mmol/L (ref 21–32)
Creatinine: 1.02 mg/dL (ref 0.60–1.30)
EGFR (African American): >60
GFR CALC NON AF AMER: 53 — AB
Glucose: 309 mg/dL — ABNORMAL HIGH (ref 65–99)
Osmolality: 281 (ref 275–301)
Potassium: 4.8 mmol/L (ref 3.5–5.1)
SGOT(AST): 36 U/L (ref 15–37)
SGPT (ALT): 16 U/L
Sodium: 135 mmol/L — ABNORMAL LOW (ref 136–145)
TOTAL PROTEIN: 7.4 g/dL (ref 6.4–8.2)

## 2014-05-30 LAB — CBC WITH DIFFERENTIAL/PLATELET
BASOS ABS: 0.2 10*3/uL — AB (ref 0.0–0.1)
Basophil %: 0.7 %
EOS ABS: 0 10*3/uL (ref 0.0–0.7)
EOS PCT: 0.1 %
HCT: 42.5 % (ref 35.0–47.0)
HGB: 14 g/dL (ref 12.0–16.0)
Lymphocyte #: 1.9 10*3/uL (ref 1.0–3.6)
Lymphocyte %: 7.5 %
MCH: 30.1 pg (ref 26.0–34.0)
MCHC: 33.1 g/dL (ref 32.0–36.0)
MCV: 91 fL (ref 80–100)
MONOS PCT: 6.8 %
Monocyte #: 1.7 x10 3/mm — ABNORMAL HIGH (ref 0.2–0.9)
NEUTROS PCT: 84.9 %
Neutrophil #: 21.7 10*3/uL — ABNORMAL HIGH (ref 1.4–6.5)
Platelet: 268 10*3/uL (ref 150–440)
RBC: 4.66 10*6/uL (ref 3.80–5.20)
RDW: 13.7 % (ref 11.5–14.5)
WBC: 25.6 10*3/uL — ABNORMAL HIGH (ref 3.6–11.0)

## 2014-05-30 LAB — URINALYSIS, COMPLETE
BILIRUBIN, UR: NEGATIVE
Glucose,UR: >=500 mg/dL (ref 0–75)
Ketone: NEGATIVE
NITRITE: POSITIVE
Ph: 5 (ref 4.5–8.0)
Protein: 30
SPECIFIC GRAVITY: 1.014 (ref 1.003–1.030)
Squamous Epithelial: 3

## 2014-05-30 LAB — CK TOTAL AND CKMB (NOT AT ARMC)
CK, Total: 193 U/L — ABNORMAL HIGH
CK-MB: 0.6 ng/mL (ref 0.5–3.6)

## 2014-05-30 LAB — TROPONIN I: Troponin-I: <0.02 ng/mL

## 2014-05-31 LAB — BASIC METABOLIC PANEL
Anion Gap: 8 (ref 7–16)
BUN: 12 mg/dL (ref 7–18)
CO2: 27 mmol/L (ref 21–32)
CREATININE: 1.09 mg/dL (ref 0.60–1.30)
Calcium, Total: 8.4 mg/dL — ABNORMAL LOW (ref 8.5–10.1)
Chloride: 107 mmol/L (ref 98–107)
EGFR (African American): 57 — ABNORMAL LOW
EGFR (Non-African Amer.): 49 — ABNORMAL LOW
Glucose: 234 mg/dL — ABNORMAL HIGH (ref 65–99)
OSMOLALITY: 290 (ref 275–301)
Potassium: 3.7 mmol/L (ref 3.5–5.1)
Sodium: 142 mmol/L (ref 136–145)

## 2014-05-31 LAB — CBC WITH DIFFERENTIAL/PLATELET
BASOS PCT: 0.7 %
Basophil #: 0.1 10*3/uL (ref 0.0–0.1)
EOS ABS: 0.3 10*3/uL (ref 0.0–0.7)
EOS PCT: 1.6 %
HCT: 39.1 % (ref 35.0–47.0)
HGB: 13 g/dL (ref 12.0–16.0)
LYMPHS PCT: 13.6 %
Lymphocyte #: 2.1 10*3/uL (ref 1.0–3.6)
MCH: 30.6 pg (ref 26.0–34.0)
MCHC: 33.4 g/dL (ref 32.0–36.0)
MCV: 92 fL (ref 80–100)
Monocyte #: 1.5 x10 3/mm — ABNORMAL HIGH (ref 0.2–0.9)
Monocyte %: 9.9 %
NEUTROS PCT: 74.2 %
Neutrophil #: 11.4 10*3/uL — ABNORMAL HIGH (ref 1.4–6.5)
Platelet: 228 10*3/uL (ref 150–440)
RBC: 4.26 10*6/uL (ref 3.80–5.20)
RDW: 13.5 % (ref 11.5–14.5)
WBC: 15.4 10*3/uL — AB (ref 3.6–11.0)

## 2014-06-01 DIAGNOSIS — R5381 Other malaise: Secondary | ICD-10-CM | POA: Diagnosis not present

## 2014-06-01 DIAGNOSIS — N390 Urinary tract infection, site not specified: Secondary | ICD-10-CM | POA: Diagnosis not present

## 2014-06-01 DIAGNOSIS — R5383 Other fatigue: Secondary | ICD-10-CM | POA: Diagnosis not present

## 2014-06-01 LAB — CBC WITH DIFFERENTIAL/PLATELET
BASOS PCT: 0.8 %
Basophil #: 0.1 10*3/uL (ref 0.0–0.1)
EOS ABS: 0.6 10*3/uL (ref 0.0–0.7)
EOS PCT: 4.6 %
HCT: 38.8 % (ref 35.0–47.0)
HGB: 12.8 g/dL (ref 12.0–16.0)
LYMPHS ABS: 2.2 10*3/uL (ref 1.0–3.6)
Lymphocyte %: 16.5 %
MCH: 30.4 pg (ref 26.0–34.0)
MCHC: 33 g/dL (ref 32.0–36.0)
MCV: 92 fL (ref 80–100)
MONOS PCT: 10 %
Monocyte #: 1.3 x10 3/mm — ABNORMAL HIGH (ref 0.2–0.9)
NEUTROS PCT: 68.1 %
Neutrophil #: 9.2 10*3/uL — ABNORMAL HIGH (ref 1.4–6.5)
PLATELETS: 235 10*3/uL (ref 150–440)
RBC: 4.22 10*6/uL (ref 3.80–5.20)
RDW: 13.7 % (ref 11.5–14.5)
WBC: 13.5 10*3/uL — AB (ref 3.6–11.0)

## 2014-06-02 LAB — URINE CULTURE

## 2014-06-04 LAB — CULTURE, BLOOD (SINGLE)

## 2014-09-23 ENCOUNTER — Emergency Department: Payer: Self-pay | Admitting: Emergency Medicine

## 2014-10-01 ENCOUNTER — Ambulatory Visit: Payer: Self-pay

## 2014-10-01 LAB — URINALYSIS, COMPLETE
BLOOD: NEGATIVE
Bilirubin,UR: NEGATIVE
Glucose,UR: NEGATIVE mg/dL (ref 0–75)
Ketone: NEGATIVE
NITRITE: NEGATIVE
Ph: 5 (ref 4.5–8.0)
Protein: 100
RBC,UR: 21 /HPF (ref 0–5)
Specific Gravity: 1.019 (ref 1.003–1.030)

## 2014-10-03 LAB — URINE CULTURE

## 2014-10-29 ENCOUNTER — Ambulatory Visit: Payer: Self-pay

## 2014-10-30 ENCOUNTER — Ambulatory Visit: Payer: Self-pay

## 2014-10-30 LAB — URINALYSIS, COMPLETE
BILIRUBIN, UR: NEGATIVE
Blood: NEGATIVE
Glucose,UR: NEGATIVE mg/dL (ref 0–75)
Ketone: NEGATIVE
Nitrite: POSITIVE
PH: 5 (ref 4.5–8.0)
Protein: NEGATIVE
Specific Gravity: 1.017 (ref 1.003–1.030)

## 2014-11-03 LAB — URINE CULTURE

## 2015-01-02 DIAGNOSIS — S30810A Abrasion of lower back and pelvis, initial encounter: Secondary | ICD-10-CM | POA: Diagnosis not present

## 2015-01-02 DIAGNOSIS — W19XXXA Unspecified fall, initial encounter: Secondary | ICD-10-CM | POA: Diagnosis not present

## 2015-01-05 ENCOUNTER — Encounter: Payer: Self-pay | Admitting: Adult Health

## 2015-01-05 ENCOUNTER — Non-Acute Institutional Stay (SKILLED_NURSING_FACILITY): Payer: Medicare Other | Admitting: Adult Health

## 2015-01-05 DIAGNOSIS — K219 Gastro-esophageal reflux disease without esophagitis: Secondary | ICD-10-CM | POA: Diagnosis not present

## 2015-01-05 DIAGNOSIS — G20 Parkinson's disease: Secondary | ICD-10-CM | POA: Diagnosis not present

## 2015-01-05 DIAGNOSIS — J309 Allergic rhinitis, unspecified: Secondary | ICD-10-CM

## 2015-01-05 DIAGNOSIS — F3130 Bipolar disorder, current episode depressed, mild or moderate severity, unspecified: Secondary | ICD-10-CM | POA: Diagnosis not present

## 2015-01-05 DIAGNOSIS — B370 Candidal stomatitis: Secondary | ICD-10-CM | POA: Diagnosis not present

## 2015-01-05 DIAGNOSIS — I10 Essential (primary) hypertension: Secondary | ICD-10-CM | POA: Diagnosis not present

## 2015-01-05 DIAGNOSIS — G20A1 Parkinson's disease without dyskinesia, without mention of fluctuations: Secondary | ICD-10-CM

## 2015-01-05 DIAGNOSIS — E1140 Type 2 diabetes mellitus with diabetic neuropathy, unspecified: Secondary | ICD-10-CM | POA: Diagnosis not present

## 2015-01-05 DIAGNOSIS — G629 Polyneuropathy, unspecified: Secondary | ICD-10-CM | POA: Diagnosis not present

## 2015-01-05 DIAGNOSIS — B379 Candidiasis, unspecified: Secondary | ICD-10-CM | POA: Insufficient documentation

## 2015-01-05 DIAGNOSIS — F319 Bipolar disorder, unspecified: Secondary | ICD-10-CM | POA: Insufficient documentation

## 2015-01-05 NOTE — Progress Notes (Signed)
Patient ID: Maureen Taylor, female   DOB: [birth year], 80 y.o.   MRN: [id number]   01/05/2015  Facility:  Nursing Home Location:  [facility] Room Number: 901-1 LEVEL OF CARE:  SNF (31)   Chief Complaint  Patient presents with  . Hospitalization Follow-up    Bipolar depression, GERD, allergic rhinitis, hypertension, oral thrush, Parkinson's disease, neuropathy and diabetes mellitus    HISTORY OF PRESENT ILLNESS:  This is a 80 year old female who has been [facility]. She has PMH of hypertension, depression, GERD, allergic rhinitis, diabetes mellitus, Parkinson's disease and neuropathy.  She has been admitted for long-term care.  PAST MEDICAL HISTORY:   hypertension, depression, GERD, allergic rhinitis, diabetes mellitus, Parkinson's disease and neuropathy  CURRENT MEDICATIONS: Reviewed per MAR/see medication list  Allergies  Allergen Reactions  . Codeine   . Indocin [Indomethacin]   . Morphine And Related     REVIEW OF SYSTEMS:  GENERAL: no change in appetite, no fatigue, no weight changes, no fever, chills or weakness RESPIRATORY: no cough, SOB, DOE, wheezing, hemoptysis CARDIAC: no chest pain, edema or palpitations GI: no abdominal pain, diarrhea, constipation, heart burn, nausea or vomiting  PHYSICAL EXAMINATION  GENERAL: no acute distress, obese EYES: conjunctivae normal, sclerae normal, normal eye lids NECK: supple, trachea midline, no neck masses, no thyroid tenderness, no thyromegaly LYMPHATICS: no LAN in the neck, no supraclavicular LAN RESPIRATORY: breathing is even & unlabored, BS CTAB CARDIAC: RRR, no murmur,no extra heart sounds, no edema GI: abdomen soft, normal BS, no masses, no tenderness, no hepatomegaly, no splenomegaly, + abdominal hernia EXTREMITIES:  Able to move 4 extremities PSYCHIATRIC: the patient is alert & oriented to person, affect & behavior  appropriate  LABS/RADIOLOGY: 10/29/14  WBC 9.9 hemoglobin 13.8 hematocrit 42.0 MCV 92.9 10/28/14  sodium 138 potassium 4.2 glucose 259 BUN 18 creatinine 0.9 calcium 9.3 total protein 6.3 albumin 3.6 total bilirubin 0.4 ALP 101 AST 14 ALT 14 GFR >60 WBC 10.9 hemoglobin 13.6 hematocrit 41.4 MCV 93.9 10/24/14  magnesium 2.0  ASSESSMENT/PLAN:  Bipolar depression - mood is stable; continue Lamictal 100 mg by mouth twice a day GERD - continue Protonix 40 mg by mouth daily and fluticasone 50 g/ACT 2 sprays in each nostril by mouth daily Allergic rhinitis - continue Claritin 10 mg by mouth daily Hypertension - well-controlled; continue losartan 50 mg by mouth daily and metoprolol 25 mg by mouth twice a day Oral thrush - continue Magic mouthwash 5 mL before meals and at bedtime 2 weeks Parkinson's disease - continue Sinemet 25/100 mg by mouth twice a day Neuropathy - continue gabapentin. 200 mg by mouth twice a day and Cymbalta 60 mg by mouth daily Diabetes mellitus, type II - continue Lantus 26 units subcutaneous daily at bedtime and Glimepiride 4 mg by mouth daily    Goals of care:  long-term care   Labs/test ordered:   CBC, CMP and hgA1c  Spent 50 minutes in patient care.   [facility] JerseyMEDINA-VARGAS,Ronon Ferger, NP [facility] [phone]

## 2015-01-06 ENCOUNTER — Non-Acute Institutional Stay (SKILLED_NURSING_FACILITY): Payer: Medicare Other | Admitting: Internal Medicine

## 2015-01-06 DIAGNOSIS — G629 Polyneuropathy, unspecified: Secondary | ICD-10-CM

## 2015-01-06 DIAGNOSIS — E1140 Type 2 diabetes mellitus with diabetic neuropathy, unspecified: Secondary | ICD-10-CM | POA: Diagnosis not present

## 2015-01-06 DIAGNOSIS — B370 Candidal stomatitis: Secondary | ICD-10-CM | POA: Diagnosis not present

## 2015-01-06 DIAGNOSIS — F3130 Bipolar disorder, current episode depressed, mild or moderate severity, unspecified: Secondary | ICD-10-CM

## 2015-01-06 DIAGNOSIS — M1460 Charcot's joint, unspecified site: Secondary | ICD-10-CM | POA: Diagnosis not present

## 2015-01-06 DIAGNOSIS — M25551 Pain in right hip: Secondary | ICD-10-CM

## 2015-01-06 DIAGNOSIS — N3281 Overactive bladder: Secondary | ICD-10-CM | POA: Diagnosis not present

## 2015-01-06 DIAGNOSIS — I10 Essential (primary) hypertension: Secondary | ICD-10-CM

## 2015-01-06 DIAGNOSIS — J309 Allergic rhinitis, unspecified: Secondary | ICD-10-CM | POA: Diagnosis not present

## 2015-01-06 DIAGNOSIS — G20 Parkinson's disease: Secondary | ICD-10-CM | POA: Diagnosis not present

## 2015-01-06 DIAGNOSIS — E119 Type 2 diabetes mellitus without complications: Secondary | ICD-10-CM | POA: Diagnosis not present

## 2015-01-06 DIAGNOSIS — L719 Rosacea, unspecified: Secondary | ICD-10-CM | POA: Diagnosis not present

## 2015-01-06 DIAGNOSIS — K219 Gastro-esophageal reflux disease without esophagitis: Secondary | ICD-10-CM

## 2015-01-06 DIAGNOSIS — F329 Major depressive disorder, single episode, unspecified: Secondary | ICD-10-CM | POA: Diagnosis not present

## 2015-01-06 DIAGNOSIS — G20A1 Parkinson's disease without dyskinesia, without mention of fluctuations: Secondary | ICD-10-CM

## 2015-01-06 DIAGNOSIS — F319 Bipolar disorder, unspecified: Secondary | ICD-10-CM

## 2015-01-06 DIAGNOSIS — Z794 Long term (current) use of insulin: Secondary | ICD-10-CM | POA: Diagnosis not present

## 2015-01-06 NOTE — Progress Notes (Signed)
Patient ID: BARBERA PERRITT, female   DOB: 03/17/1935, 80 y.o.   MRN: [id number]     [facility]   PCP: No PCP Per Patient  Code Status: full code  Allergies  Allergen Reactions  . Codeine   . Indocin [Indomethacin]   . Morphine And Related     Chief Complaint  Patient presents with  . New Admit To SNF     HPI:  80 year old patient is here for long term care from [facility] nursing facility. She has PMH of hypertension, depression, GERD, allergic rhinitis, diabetes mellitus, Parkinson's disease and neuropathy. She is seen in her room. She is in no distress. Complaints of ongoing neuropathic pain in her feet and right hip pain post fall in august on 2015. Her last bowel movement was 2 days back. She needs assistance with bathing. She mobilizes with a rollator walker and wheelchair. She is continent with her bowel and bladder.   Review of Systems:  Constitutional: Negative for fever, chills, malaise/fatigue and diaphoresis.  HENT: Negative for headache, congestion, nasal discharge Eyes: Negative for eye pain, blurred vision, double vision and discharge.  Respiratory: Negative for cough, shortness of breath and wheezing.   Cardiovascular: Negative for chest pain, palpitations, leg swelling.  Gastrointestinal: Negative for heartburn, nausea, vomiting, abdominal pain. Has bowel movement every other day Genitourinary: Negative for dysuria Musculoskeletal: Negative for back pain, recent falls Skin: Negative for itching, rash. gets frequent abdominal fold yeast infection Neurological: Negative for dizziness, focal weakness Psychiatric/Behavioral: Negative for depression  Past medical history reviewed. See hpi  Family history non contributory  Social history residing in another SNF prior to this  Medications: Patient's Medications  New Prescriptions   No medications on file  Previous Medications   ACIDOPHILUS (RISAQUAD) CAPS CAPSULE    Take 1  capsule by mouth daily.   ALUM & MAG HYDROXIDE-SIMETH (MAGIC MOUTHWASH) SOLN    Take 5 mLs by mouth 4 (four) times daily -  before meals and at bedtime.   CARBIDOPA-LEVODOPA (SINEMET IR) 25-100 MG PER TABLET    Take 1 tablet by mouth 2 (two) times daily.   CRANBERRY 200 MG CAPS    Take 2 capsules by mouth daily.   DULOXETINE (CYMBALTA) 60 MG CAPSULE    Take 60 mg by mouth daily.   FLUTICASONE (FLONASE) 50 MCG/ACT NASAL SPRAY    Place 2 sprays into both nostrils daily.   GABAPENTIN (NEURONTIN) 100 MG CAPSULE    Take 100 mg by mouth 2 (two) times daily. Take 2 capsules   GLIMEPIRIDE (AMARYL) 4 MG TABLET    Take 4 mg by mouth daily with breakfast.   INSULIN GLARGINE (LANTUS) 100 UNIT/ML INJECTION    Inject 26 Units into the skin daily.   INSULIN GLARGINE (LANTUS) 100 UNIT/ML INJECTION    Inject 26 Units into the skin at bedtime.   LAMOTRIGINE (LAMICTAL) 100 MG TABLET    Take 100 mg by mouth.   LORATADINE (CLARITIN) 10 MG TABLET    Take 10 mg by mouth daily.   LOSARTAN (COZAAR) 50 MG TABLET    Take 50 mg by mouth daily.   MAGNESIUM OXIDE (MAG-OX) 400 MG TABLET    Take 400 mg by mouth daily.   METOPROLOL TARTRATE (LOPRESSOR) 25 MG TABLET    Take 25 mg by mouth.   MULTIPLE VITAMINS-MINERALS (THEREMS M PO)    Take 1 tablet by mouth daily.   PANTOPRAZOLE (PROTONIX) 40 MG TABLET  Take 40 mg by mouth daily.  Modified Medications   No medications on file  Discontinued Medications   No medications on file     Physical Exam: Filed Vitals:   01/06/15 1703  BP: 150/84  Pulse: 86  Temp: 98 F (36.7 C)  Resp: 18  SpO2: 96%    General- elderly female, obese,  in no acute distress Head- normocephalic, atraumatic Throat- moist mucus membrane Eyes- PERRLA, EOMI, no pallor, no icterus, no discharge, normal conjunctiva, normal sclera Neck- no cervical lymphadenopathy, no supraclavicular lymphadenopathy, no thyromegaly, no jugular vein distension Cardiovascular- normal s1,s2, no murmurs, palpable  dorsalis pedis, trace leg edema Respiratory- bilateral clear to auscultation, no wheeze, no rhonchi, no crackles, no use of accessory muscles Abdomen- bowel sounds present, soft, non tender Musculoskeletal- able to move all 4 extremities, limited normal range of motion  Neurological- no focal deficit Skin- warm and dry Psychiatry- alert and oriented, normal mood and affect    Labs reviewed: 10/29/14  WBC 9.9 hemoglobin 13.8 hematocrit 42.0 MCV 92.9 10/28/14  sodium 138 potassium 4.2 glucose 259 BUN 18 creatinine 0.9 calcium 9.3 total protein 6.3 albumin 3.6 total bilirubin 0.4 ALP 101 AST 14 ALT 14 GFR >60 WBC 10.9 hemoglobin 13.6 hematocrit 41.4 MCV 93.9 10/24/14  magnesium 2.0   Assessment/Plan  Dm type 2 Monitor cbg, continue Lantus 26 units daily at bedtime and Glimepiride 4 mg daily. Unclear if pt was getting lantus bid or daily at the other facility. Check a1c and adjust dosing further if needed. Continue aspirin 81 mg daily. Not on any statin. Check lipid panel and tsh next lab  Neuropathy Continue gabapentin 200 mg bid and cymbalta 60 mg daily  Bipolar disorder continue Lamictal 100 mg bid, duloxetine 60 mg daily  Parkinson's disease continue Sinemet 25/100 mg bid  GERD continue Protonix 40 mg daily   Allergic rhinitis Continue fluticasone and claritin 10 mg daily  Hypertension Mildly elevated bp reading today, continue losartan 50 mg daily and metoprolol 25 mg bid, monitor bp q shift for 1 week and adjust further if needed  Oral thrush continue Magic mouthwash 5 mL before meals and at bedtime   Right hip pain Reviewed xray showing right prosthetic hip without any osseous pathology.  start tylenol 650 mg q8h prn for pain and reassess  Goals of care: long term care   Labs/tests ordered: cbc, cmp, a1c, tsh, lipid panel  Family/ staff Communication: reviewed care plan with patient and nursing supervisor    Oneal GroutMAHIMA Aljean Horiuchi, MD  [facility] [phone] (Monday-Friday 8 am - 5 pm) [phone] (afterhours)

## 2015-01-07 DIAGNOSIS — E119 Type 2 diabetes mellitus without complications: Secondary | ICD-10-CM | POA: Diagnosis not present

## 2015-01-08 DIAGNOSIS — G20 Parkinson's disease: Secondary | ICD-10-CM | POA: Diagnosis not present

## 2015-01-08 DIAGNOSIS — I10 Essential (primary) hypertension: Secondary | ICD-10-CM | POA: Diagnosis not present

## 2015-01-08 DIAGNOSIS — R278 Other lack of coordination: Secondary | ICD-10-CM | POA: Diagnosis not present

## 2015-01-08 DIAGNOSIS — R1312 Dysphagia, oropharyngeal phase: Secondary | ICD-10-CM | POA: Diagnosis not present

## 2015-01-08 DIAGNOSIS — F329 Major depressive disorder, single episode, unspecified: Secondary | ICD-10-CM | POA: Diagnosis not present

## 2015-01-08 DIAGNOSIS — R41841 Cognitive communication deficit: Secondary | ICD-10-CM | POA: Diagnosis not present

## 2015-01-08 DIAGNOSIS — M1460 Charcot's joint, unspecified site: Secondary | ICD-10-CM | POA: Diagnosis not present

## 2015-01-08 DIAGNOSIS — K219 Gastro-esophageal reflux disease without esophagitis: Secondary | ICD-10-CM | POA: Diagnosis not present

## 2015-01-08 DIAGNOSIS — N3281 Overactive bladder: Secondary | ICD-10-CM | POA: Diagnosis not present

## 2015-01-08 DIAGNOSIS — L719 Rosacea, unspecified: Secondary | ICD-10-CM | POA: Diagnosis not present

## 2015-01-08 DIAGNOSIS — E119 Type 2 diabetes mellitus without complications: Secondary | ICD-10-CM | POA: Diagnosis not present

## 2015-01-08 DIAGNOSIS — D72829 Elevated white blood cell count, unspecified: Secondary | ICD-10-CM | POA: Diagnosis not present

## 2015-01-08 DIAGNOSIS — N390 Urinary tract infection, site not specified: Secondary | ICD-10-CM | POA: Diagnosis not present

## 2015-01-08 DIAGNOSIS — R2681 Unsteadiness on feet: Secondary | ICD-10-CM | POA: Diagnosis not present

## 2015-01-08 DIAGNOSIS — Z794 Long term (current) use of insulin: Secondary | ICD-10-CM | POA: Diagnosis not present

## 2015-01-08 DIAGNOSIS — G629 Polyneuropathy, unspecified: Secondary | ICD-10-CM | POA: Diagnosis not present

## 2015-01-08 DIAGNOSIS — M6281 Muscle weakness (generalized): Secondary | ICD-10-CM | POA: Diagnosis not present

## 2015-01-08 DIAGNOSIS — Z9181 History of falling: Secondary | ICD-10-CM | POA: Diagnosis not present

## 2015-01-09 DIAGNOSIS — G20 Parkinson's disease: Secondary | ICD-10-CM | POA: Diagnosis not present

## 2015-01-09 DIAGNOSIS — M6281 Muscle weakness (generalized): Secondary | ICD-10-CM | POA: Diagnosis not present

## 2015-01-09 DIAGNOSIS — R278 Other lack of coordination: Secondary | ICD-10-CM | POA: Diagnosis not present

## 2015-01-09 DIAGNOSIS — E119 Type 2 diabetes mellitus without complications: Secondary | ICD-10-CM | POA: Diagnosis not present

## 2015-01-09 DIAGNOSIS — R41841 Cognitive communication deficit: Secondary | ICD-10-CM | POA: Diagnosis not present

## 2015-01-09 DIAGNOSIS — R2681 Unsteadiness on feet: Secondary | ICD-10-CM | POA: Diagnosis not present

## 2015-01-12 DIAGNOSIS — R41841 Cognitive communication deficit: Secondary | ICD-10-CM | POA: Diagnosis not present

## 2015-01-12 DIAGNOSIS — G20 Parkinson's disease: Secondary | ICD-10-CM | POA: Diagnosis not present

## 2015-01-12 DIAGNOSIS — R2681 Unsteadiness on feet: Secondary | ICD-10-CM | POA: Diagnosis not present

## 2015-01-12 DIAGNOSIS — M6281 Muscle weakness (generalized): Secondary | ICD-10-CM | POA: Diagnosis not present

## 2015-01-12 DIAGNOSIS — R278 Other lack of coordination: Secondary | ICD-10-CM | POA: Diagnosis not present

## 2015-01-12 DIAGNOSIS — E119 Type 2 diabetes mellitus without complications: Secondary | ICD-10-CM | POA: Diagnosis not present

## 2015-01-13 DIAGNOSIS — R2681 Unsteadiness on feet: Secondary | ICD-10-CM | POA: Diagnosis not present

## 2015-01-13 DIAGNOSIS — G20 Parkinson's disease: Secondary | ICD-10-CM | POA: Diagnosis not present

## 2015-01-13 DIAGNOSIS — M6281 Muscle weakness (generalized): Secondary | ICD-10-CM | POA: Diagnosis not present

## 2015-01-13 DIAGNOSIS — R41841 Cognitive communication deficit: Secondary | ICD-10-CM | POA: Diagnosis not present

## 2015-01-13 DIAGNOSIS — R278 Other lack of coordination: Secondary | ICD-10-CM | POA: Diagnosis not present

## 2015-01-13 DIAGNOSIS — E119 Type 2 diabetes mellitus without complications: Secondary | ICD-10-CM | POA: Diagnosis not present

## 2015-01-13 NOTE — Consult Note (Signed)
Psychological Assessment  Maureen CaiJessie Hester76of Evaluation: 9-12-13Administered:   Geraldo PitterBender Gestalt   [employer] for Referral: Maureen Taylor was referred for a psychological assessment by her psychiatrist, Caryn SectionAarti Kapur, MD.  She was assessed on the medical floor where she was admitted for the treatment of confusion and lethargy. Psychiatry was consulted for issues of depression and anxiety. Please see the history and physical and psychiatric consultation for further background information. A neuro-psychological screening was requested to aid in a differential diagnosis of vascular dementia, pseudodementia or depression.  Maureen Taylor was pleasant and cooperative with the testing process. She was assessed in her hospital room, sitting upright in a chair. She attempted all tasks requested of her. She appeared to try her best. The present evaluation is considered a valid indication of current functioning. of TestingMaking Test: Maureen Taylor completed Part A in 69 seconds. Expected time to completion is 27-39 seconds. Her performance reflects moderate to severe impairment. She was unable to complete Part B. After 187 seconds she reported she was confused and did not know how to proceed. Expected time to completion is 66-85 seconds. Gestalt: Maureen Taylor completed the drawings in 15 minutes. She obtained 6 errors: perseveration, collision, impotence, closure difficulty, angulation difficulty and cohesion.  An error rate of 3 is expected of an adult aged 78 to 6374 or an older adult not suspected of dementia. Maureen Taylor?s 6 errors suggest some evidence of brain impairment. Impression:to moderate evidence of brain impairment   Electronic Signatures: Maureen Taylor, Maureen Taylor (PsyD, HSP-P)  (Signed on 12-Sep-13 11:29)  Authored  Last Updated: 12-Sep-13 11:29 by Maureen Taylor, Maureen Taylor (PsyD, HSP-P)

## 2015-01-13 NOTE — Discharge Summary (Signed)
PATIENT NAME:  Maureen Taylor, Maureen Taylor MR#:  [id number] DATE OF BIRTH:  1935-01-15  DATE OF ADMISSION:  06/05/2012 DATE OF DISCHARGE:  06/08/2012  PRIMARY DIAGNOSES: 1. Urinary tract infection.  2. Metabolic encephalopathy secondary to #1.  3. New onset Parkinson's disease.  4. 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  5. Major depression.  6. Diabetes mellitus, uncontrolled.  7. Hypertension.  8. Gastroesophageal reflux disease.  9. Obstructive sleep apnea, intolerant of CPAP.  10. Hyperlipidemia.  11. Obesity.  12. Osteoarthritis.  13. Status post cholecystectomy.  14. Status post hysterectomy.  15. Status post  tonsillectomy.  16. Status post cervical fusion for degenerative disk disease.  17. Status post bilateral carpal tunnel release.  18. Status post left rotator cuff repair.  19. Status post bilateral knee replacements.   HISTORY AND PHYSICAL: Please see dictated admission History and Physical.   HOSPITAL COURSE: The patient was admitted with confusion and found to have evidence of urinary tract infection. Urine cultures revealed Escherichia coli sensitive to cephalosporins so she was changed over to Ceftin to complete a course. Her mental status initially improved; however, the amount of confusion appeared to be out of proportion to what we would expect with urinary tract infection. She was seen by neurology and was noted to have had increased falls recently, difficulty with ambulation, and some tremor in her right hand. It was felt that she may have early onset of Parkinson's disease. She was placed on Sinemet initially t.i.d. However, she developed some hypotension with this and  the dose was reduced. With this her mobility improved significantly and her tremor also improved.   The patient has an extensive history of depression, and was seen by Behavioral Medicine who thought this was playing a role as well. She had neuropsychiatric testing which  revealed some evidence of mild to moderate dementia. However, again this is interspersed in the patient's major depression, meaning that pseudodementia may also be playing a role. The recommendation was for her to follow up with psychiatry. She has an appointment set up with Dr. Ulis Rias as an outpatient.   She complained of significant pain in her back after her falls. Films revealed no evidence of fracture though she does have some arthritis. She underwent MRI of the brain, which revealed small vessel disease without evidence of stroke. Pain medications were adjusted. She seemed to tolerate these well, again with improvement in ambulation with this as well as with Parkinson's medications.   Due to her mental status changes, her back pain, and her falls, it was felt that she would benefit from being in a more controlled environment. She was willing to pursue this and so she will be sent to a skilled nursing facility for short-term rehabilitation with her physical activity to be up with a walker with assistance as tolerated. Her condition at discharge is stable. Her diet should be  no added  salt, no concentrated sweets. She needs sugars checked three  times a day before meals. Physical therapy and occupational therapy should evaluate and treat the patient. She will need a MET-B and urinalysis in one week with results to the nursing home physician.   She was placed on Lantus as she had been on Victoza as an outpatient but elected to stop this on her own when she had read about side effects. Her sugars are improved, but will require continued adjustment of the medication for optimal control, and this is deferred to the nursing home physician as well.  DISCHARGE MEDICATIONS:  1. Claritin 10 mg p.o. daily.  2. Tramadol/acetaminophen 1 p.o. every eight hours p.r.n. severe pain.  3. Metformin 1000 mg p.o. b.i.d.  4. Neurontin 400 mg p.o. t.i.d. for peripheral neuropathy.  5. Sliding scale insulin  subcutaneous.  6. Sertraline 50 mg p.o. daily.  7. Simvastatin 40 mg p.o. at bedtime. 8. Omeprazole 20 mg p.o. daily for gastroesophageal reflux disease.  9. Glimepiride 4 mg p.o. b.i.d. with meals.  10. Lopressor 25 mg p.o. b.i.d.  11. Oxybutynin ER 5 mg p.o. daily for bladder spasm.  12. Ceftin 500 mg p.o. b.i.d. times five days.  13. Cymbalta 60 mg p.o. daily.  14. Sinemet 25-100 mg, one p.o. b.i.d. with meals.  15. Ambien 5 mg p.o. at bedtime p.r.n. sleep.  16. Lantus 16 units subcutaneous at bedtime.   The patient was taken off triamterene HCTZ and hydroxyzine. She will be taken off lisinopril secondary to hypotension at this time, but consideration may be made for adding this back in if her blood pressure climbs. The use of Cymbalta and sertraline together was discussed with psychiatry and they feel that this is necessary. Ultimately we would rather have her on Abilify rather than the sertraline. However, there was some concern that that this may make any parkinsonian symptoms worse. This was discussed with the patient to the best of our ability.   CODE STATUS: Patient is FULL CODE.   TIME SPENT ON DISCHARGE:  50 minutes.     ____________________________ Adin Hector, MD [id number] D: 06/08/2012 12:44:13 ET T: 06/08/2012 13:00:31 ET JOB#: [id number]  cc: Tama High III, MD, <Dictator> Ramonita Lab MD ELECTRONICALLY SIGNED 06/19/2012 12:35

## 2015-01-13 NOTE — Consult Note (Signed)
PATIENT NAME:  Maureen Taylor, Maureen Taylor MR#:  [id number] DATE OF BIRTH:  April 02, 1935  DATE OF CONSULTATION:  06/06/2012  REFERRING PHYSICIAN:  Lynnea FerrierBert J. Klein, III, MD  CONSULTING PHYSICIAN:  Doralee AlbinoAarti K. Maryruth BunKapur, MD  REASON FOR CONSULTATION: Depression and anxiety.   IDENTIFYING INFORMATION: Maureen Taylor is a 80 year old widowed Caucasian female currently living in the [place] area alone. She had three daughters, but her older daughter died. One daughter lives in [place], and one daughter is in [place]. Her husband died of colon cancer in 2003. 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   HISTORY OF PRESENT ILLNESS: Maureen Taylor is a 80 year old widowed Caucasian female with chronic multiple medical problems including hypertension, hyperlipidemia, osteoarthritis, obstructive sleep apnea and diabetes who was admitted to the Medicine Service with confusion and lethargy. She was found to have a urinary tract infection in the Emergency Room.  CT of the head was negative. The patient had apparently had a fall at home and was brought into the hospital by her daughter. When she was initially presented to the Emergency Room, mental status was altered; today she is alert and oriented to time, place, and situation and able to give a fairly reliable history. Psychiatry was consulted due to concern for depression. The patient does admit that she has been struggling with depression for over 20 years and had one prior inpatient psychiatric hospitalization at the old [facility]. She does have a history of suicidal thoughts in the past but denies any attempts. She does endorse problems with worsening depressive symptoms since her husband died in 2003 of colon cancer. They had been married for over 45 years. In addition, during the same time period two of her sisters died, and she had a daughter who died of complications from diabetes in January 2004. More recently, the patient has had some issues with her grandson's divorce and her  great-grandson not being able to accept it. She does endorse feelings of hopelessness and helplessness, frequent crying spells, difficulty with focus and concentration, and insomnia. She says that she only sleeps 2 to 3 hours per night. She denies any weight loss but does report increased appetite. She denies any current suicidal thoughts at the present time but does admit to depressive mood. There is some psychomotor slowing, and thought processes are slowed at times. She, however,  did well in terms of memory and recall, recalls three out of three initially and three out of three after five minutes. She can name the presidents backwards to [place] and spell world backwards correctly. In addition, she was able to do serial sevens back to 81. When asked about simple proverbs, however, such as what the does the statement mean, "Don't cry over spilled milk," the patient said, "I don't know." The patient says that she has been having problems with short-term memory for the past one year and does not understand why as she said she always had a really good memory in the past. She does report some anxiety but denies any panic attacks. She denies any psychotic symptoms including auditory or visual hallucinations. She denies any paranoid thoughts or delusions. No history of any polysubstance abuse or dependence. The patient [facility] at the time that her husband passed but has not been seeing an outpatient psychiatrist or a therapist since. She has had what she says is about 1/2 dozen falls in the past one year. She was seen by Neurology and noted to  have a tremor in her hand that may be idiopathic versus Parkinsonism. The patient was started on Sinemet per Neurology.   PAST PSYCHIATRIC HISTORY: The patient has had one prior inpatient psychiatric hospitalization at the old [facility]. She does have a history of suicidal thoughts but no attempt. She used to see Dr. Doretha Imus  in the  past and also was followed [facility]. She is not currently seeing a psychiatrist or a therapist.   PAST PSYCHOTROPIC MEDICATIONS: Prozac, Paxil, Zoloft, Effexor, Wellbutrin and Cymbalta. She was started on Cymbalta in January of this year by Dr. Yves Dill  at [facility].   SUBSTANCE ABUSE HISTORY: She denies any history of any heavy alcohol use or illicit drug use. She denies any tobacco use.   FAMILY PSYCHIATRIC HISTORY: The patient denies any history of any mental illness or substance use in the family.   PAST MEDICAL HISTORY:  1. Hypertension.  2. Diabetes. 3. Obstructive sleep apnea.  4. Gastroesophageal reflux disease.  5. Hyperlipidemia.  6. Obesity.  7. History of osteoarthritis.  8. History of pulmonary nodule. 9. History of acute diverticulitis. 10. History of cholecystectomy.  11. History of hysterectomy. 12. Tubal ligation.  13. Tonsillectomy. 14. Breast reduction. 15. Cervical fusion. 16. Bilateral carpal tunnel release.  17. Left rotator cuff repair.  18. Left total knee arthroplasty.  19. Right total knee arthroplasty.  20. She denies any history of any prior TBI or seizures.   OUTPATIENT MEDICATIONS:  1. Zocor 40 mg p.o. daily. 2. Prilosec 20 mg p.o. daily. 3. Gabapentin 400 mg p.o. t.i.d.  4. Claritin 10 mg p.o. daily.  5. Atarax 25 mg p.r.n. for itching.  6. Tramadol 1 tablet every 8 hours p.r.n. for pain. 7. Hydrochlorothiazide with triamterene 25/37.5 mg once a day.  8. Lasix 40 mg once a day p.r.n.  9. Glimepiride 4 mg p.o. Taylor.i.d.   MEDICATIONS IN THE HOSPITAL:  1. Sinemet 25/100 mg, 1 tab p.o. t.i.d.  2. Ceftin 500 mg p.o. every 12 hours. 3. Neurontin 400 mg p.o. t.i.d.  4. Zestril 10 mg p.o. daily. 5. Omeprazole 20 mg p.o. daily. 6. Metoprolol 25 mg p.o. Taylor.i.d.  7. Ditropan 5 mg p.o. daily. 8. Simvastatin 40 mg p.o. at bedtime. 9. Lovenox 40 mg Taylor.i.d.  10. Insulin Lantus 8 units at bedtime.  11. Sliding scale insulin.   12. Metformin 1000 mg p.o. Taylor.i.d.   ALLERGIES: Codeine, Indocin, morphine.   SOCIAL HISTORY: The patient was born and raised in [place], [place] by both her biological parents. She says her parents never divorced. She denies any history of any physical or sexual abuse. 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 She has since retired. She was married to her husband for 45 years, but he died in 02/01/02 due to colon cancer. She is currently widowed and never remarried. She had three daughters but her older daughter died of complications from diabetes. She has one daughter in [place] and one daughter in [place].   LEGAL HISTORY: She denies any history of any arrests or incarcerations.   MENTAL STATUS EXAM: Maureen Taylor is a 80 year old obese Caucasian female who is lying in her hospital bed in a hospital gown. She was fully alert and oriented to time, place, and situation. Thought processes, however, were somewhat slowed. Speech was slowed at times, and there was some psychomotor retardation. Mood was depressed and affect was flat. She  denied any current suicidal or homicidal thoughts. She denied any current auditory or visual hallucinations. She denied any paranoid thoughts or delusions. Attention and concentration were fair to good. Recall was three out of three initially and three out of three after five minutes. She could name the presidents backwards to [place], spell world backwards correctly. She could do serial sevens back to 81. Unfortunately, she could not understand some simple proverbs and said, "I don't know" to questions asked.   SUICIDE RISK ASSESSMENT: At this time the patient is at a low risk of harm to self and others. She does appear to have a number of losses in her family and is currently struggling with her grandson's divorce. In addition, she is widowed and living alone.   REVIEW OF SYSTEMS: CONSTITUTIONAL: She does complain of some  weakness and fatigue but denies any weight changes. She denies any fever, chills, or night sweats. HEAD: She denies any headaches or dizziness. EYES: He denies any diplopia or blurred vision. ENT: She denies any hearing loss, neck pain or throat pain. RESPIRATORY: She denies shortness of breath or cough. CARDIOVASCULAR: She denies any chest pain, orthopnea. No syncopal episodes. GI: She does complain of some nausea yesterday and today, no vomiting. She denies any abdominal pain or change in bowel movements. GENITOURINARY: She does complain of urinary tract infection symptoms and some dysuria prior to admission. ENDOCRINE: She denies any heat or cold intolerance. LYMPHATICS: She denies any anemia or easy bruising. MUSCULOSKELETAL: She denies any muscle or joint pain. NEUROLOGIC: The patient does have an unsteady gait, and she normally walks on her own at home; but when she goes out shopping she takes a walker.  She was noted to have a tremor in her right hand. PSYCHIATRIC: Please see history of present illness.   PHYSICAL EXAMINATION: VITAL SIGNS: Blood pressure is 136/81, heart rate 86, respirations 18, temperature 98.2. Please see initial physical exam as completed by Dr. Marlaine Hind.   LABORATORY, DIAGNOSTIC AND RADIOLOGICAL DATA: BMP within normal limits. Glucose 232.  CBC within normal limits. B12 548. CRP was high at 91. RPR was negative. Urinalysis was nitrite positive, 3+ leukocyte esterase, and 222 WBCs, 3+ bacteria, one epithelial cell. Urine culture showed greater than 100,000 gram-negative rods. Urine tox screen was negative for all substances. Ethanol level was less than 3.0. Ammonia level was less than 25.0. MRI of the head was negative for any acute process but did show mild chronic microangiopathy.   DIAGNOSES:  AXIS I:  1. Major depressive disorder, recurrent, moderate without psychotic features.  2. Rule out pseudodementia. 3. Rule out dementia, vascular type.   AXIS II: Deferred.    AXIS III:  1. Hypertension. 2. Diabetes. 3. Hyperlipidemia. 4. Obesity.  5. Gastroesophageal reflux disease.  6. Osteoarthritis.   AXIS IV: Severe. Lack of primary support, social isolation, noncompliant with outpatient psychiatric treatment.   AXIS V: Global Assessment of Functioning score at present equals 40.   ASSESSMENT AND TREATMENT RECOMMENDATIONS: Maureen Taylor is a 80 year old widowed Caucasian female with a history of recurrent depression who was admitted to the hospital with altered mental status and a urinary tract infection. There has been some concern about cognitive decline within the past one year. The patient does admit to recurrent depressive symptoms and has not been seeing a psychiatrist or therapist. She has been taking Cymbalta 60 mg p.o. daily since January and does believe that it has helped some with her mood, but she is still struggling with feelings  of hopelessness and helplessness, frequent crying spells and insomnia. No current suicidal thoughts or psychotic symptoms.     RECOMMENDATIONS:  1. Major depression, rule out pseudodementia, rule out vascular dementia: We will plan to get neuropsychiatric testing while the patient is here in the hospital to rule out dementia. We will go ahead an continue Cymbalta 60 mg p.o. daily and add low-dose Abilify 2 mg p.o. daily for depression and trazodone 100 mg p.o. nightly for insomnia. B12 and folic acid were within normal limits.  2. Disposition: The patient should have an outpatient psychiatrist as well as an individual therapist as she has had a number of losses in her life and is currently struggling with her grandson's divorce. At this time she is not an imminent danger to herself or others necessitating inpatient psychiatric hospitalization. We will continue to follow the patient on a daily basis.   The case was discussed with Dr. Graciela Husbands.  ____________________________ Doralee Albino. Maryruth Bun, MD akk:cbb D: 06/06/2012 18:16:40  ET T: 06/06/2012 18:47:31 ET JOB#: [id number]  cc: Emylia Latella K. Maryruth Bun, MD, <Dictator> Darliss Ridgel MD ELECTRONICALLY SIGNED 06/07/2012 8:55

## 2015-01-13 NOTE — Consult Note (Signed)
Referring Physician:  Ellin Saba   Primary Care Physician:  Dannial Monarch, [address], [place], [place] [address], [phone]  Reason for Consult:  Admit Date: 05-Jun-2012   Chief Complaint: AMS   Reason for Consult: altered mental status   History of Present Illness:  History of Present Illness:   80 yo RHD F presents from home after her daughter noted that she is not acting right.  The length of this confusion is unknown and per the chart pt had a fall the other day.  Per pt, she takes care of all of her own ADL's by herself but notes that she has been falling over the past year.  She states that she feels dizzy before some episodes but has no recollection of others.  She has multiple bruises to support this but denies urinary incontinence.  Pt overall is not the best historian.  Most history of detail obtained from chart.    ROS:   General weakness  fatigue    HEENT no complaints    Lungs no complaints    Cardiac no complaints    GI no complaints    GU no complaints    Musculoskeletal no complaints    Extremities no complaints    Skin no complaints    Neuro no complaints    Psych no complaints    Review of Systems   difficult to obtain secondary to confusion  Past Medical/Surgical Hx:  diverticulitis:   arthritis:   indigestion:   DM:   HTN:   sleep apnea:   asthma:   total knee replacement both knees '03  and '04:   torn rotator cuff repair:   gall bladder removal:   cervacal spine fusion:   carpal tunnel:   hysterectomy:   vein ligation:   tubla ligation:   tonsillectomy:   right total hip replacement:   Past Medical/ Surgical Hx:   Past Medical History 1. Last admission here was in May of 2013 with acute diverticulitis.   2. History of diabetes mellitus, on Victoza.  3. Systemic hypertension. 4. Gastroesophageal reflux disease. 5. Obstructive sleep apnea syndrome but she is intolerant to CPAP.   6. Hyperlipidemia.  7. Obesity.  8. Anxiety and depression. 9. History of osteoarthritis. 10. History of pulmonary nodule.    Past Surgical History PAST SURGICAL HISTORY:  1. Cholecystectomy.  2. Hysterectomy.  3. Tubal ligation.  4. Tonsillectomy. 5. Breast reduction. 6. Cervical fusion. 7. Bilateral carpal tunnel release. 8. Left rotator cuff repair. 9. Left total knee arthroplasty. 10. Right total knee arthroplasty.   Home Medications: Medication Instructions Last Modified Date/Time  Claritin 10 mg oral tablet 1 tab(s) orally once a day 10-Sep-13 07:42  acetaminophen-tramadol 325 mg-37.5 mg oral tablet 1  orally every 8 hours as needed for pain 10-Sep-13 07:42  Prilosec OTC 20 mg oral enteric coated tablet  10-Sep-13 07:42  Atarax 25 mg oral tablet as needed for itching 10-Sep-13 07:42  Zocor 40 mg oral tablet  10-Sep-13 07:42  metformin 1000 mg oral tablet 1 tab(s) orally 2 times a day (with meals) 10-Sep-13 07:42  hydrochlorothiazide-triamterene 25 mg-37.5 mg oral tablet  10-Sep-13 07:42  Lasix 40 mg oral tablet as needed for swelling or weight gain >2lbs in a day or 5lbs in a week 10-Sep-13 07:42  gabapentin 400 mg oral capsule cap(s) orally 3 times a day 10-Sep-13 07:42   Allergies:  Codeine: Other  Morphine: Other  Indocin: Unknown  Social/Family History:  Employment Status: retired   Lives With: alone   Living Arrangements: house   Social History: no tob, no EtOH, no illicits per chart   Family History: n/c   Vital Signs: **Vital Signs.:   10-Sep-13 06:56   Vital Signs Type Admission   Temperature Temperature (F) 98.2   Celsius 36.7   Temperature Source oral   Pulse Pulse 112   Respirations Respirations 20   Systolic BP Systolic BP 259   Mean BP 122   Pulse Ox % Pulse Ox % 95   Pulse Ox Activity Level  At rest   Oxygen Delivery Room Air/ 21 %    07:43   Nurse Fingerstick (mg/dL) FSBS (fasting range 65-99 mg/dL) 303   Physical Exam:  General:  obese, no acute distress   HEENT: normocephalic, sclera nonicteric, oropharynx clear   Neck: supple, no JVD, no bruits   Chest: CTA B, no wheezes, no rhonchi   Cardiac: RRR, no murmurs, no edema, 2+ pulses   Extremities: no C/C;  FROM   Neurologic Exam:  Mental Status: confused but can answer place and person, not time;  follows simple commands but extremely cognitively slow, no aphasia noted, mild dysarthria;  abulic   Cranial Nerves: PERRLA, EOMI, nl VF, face symmetric with mild hypomimia, tongue midline, shoulder shrug equal   Motor Exam: 5/5 B normal, tone, no tremor, mild bradykinesia   Deep Tendon Reflexes: 2+/4 B, plantars downgoing B, no Hoffman   Sensory Exam: decreased temp on L hemibody   Coordination: FTN and HTS WNL, nl RAM   Lab Results: Thyroid:  10-Sep-13 09:35    Thyroid Stimulating Hormone 0.732 (0.45-4.50 (International Unit)  ----------------------- Pregnant patients have  different reference  ranges for TSH:  - - - - - - - - - -  Pregnant, first trimetser:  0.36 - 2.50 uIU/mL)  Hepatic:  10-Sep-13 01:54    Bilirubin, Total 0.7   Alkaline Phosphatase 121   SGPT (ALT) 25   SGOT (AST) 26   Total Protein, Serum 7.2   Albumin, Serum  3.3  Routine Chem:  10-Sep-13 01:54    Glucose, Serum  311   BUN 14   Creatinine (comp) 0.87   Sodium, Serum 136   Potassium, Serum 3.9   Chloride, Serum 102   CO2, Serum 26   Calcium (Total), Serum 9.0   Osmolality (calc) 284   eGFR (African American) >60   eGFR (Non-African American) >60 (eGFR values <41m/min/1.73 m2 may be an indication of chronic kidney disease (CKD). Calculated eGFR is useful in patients with stable renal function. The eGFR calculation will not be reliable in acutely ill patients when serum creatinine is changing rapidly. It is not useful in  patients on dialysis. The eGFR calculation may not be applicable to patients at the low and high extremes of body sizes, pregnant women, and vegetarians.)    Anion Gap 8   Ethanol, S. < 3   Ethanol % (comp) < 0.003 (Result(s) reported on 05 Jun 2012 at 02:16AM.)    09:35    Ammonia, Plasma < 25 (Result(s) reported on 05 Jun 2012 at 10:21AM.)  Urine Drugs:  [id number]   Tricyclic Antidepressant, Ur Qual (comp) NEGATIVE (Result(s) reported on 05 Jun 2012 at 05:33AM.)   Amphetamines, Urine Qual. NEGATIVE   MDMA, Urine Qual. NEGATIVE   Cocaine Metabolite, Urine Qual. NEGATIVE   Opiate, Urine qual NEGATIVE   Phencyclidine, Urine Qual. NEGATIVE   Cannabinoid, Urine Qual.  NEGATIVE   Barbiturates, Urine Qual. NEGATIVE   Benzodiazepine, Urine Qual. NEGATIVE (----------------- The URINE DRUG SCREEN provides only a preliminary, unconfirmed analytical test result and should not be used for non-medical  purposes.  Clinical consideration and professional judgment should be  applied to any positive drug screen result due to possible interfering substances.  A more specific alternate chemical method must be used in order to obtain a confirmed analytical result.  Gas chromatography/mass spectrometry (GC/MS) is the preferred confirmatory method.)   Methadone, Urine Qual. NEGATIVE  Cardiac:  10-Sep-13 01:54    CK, Total 60   CPK-MB, Serum  < 0.5 (Result(s) reported on 05 Jun 2012 at 02:16AM.)   Troponin I < 0.02 (0.00-0.05 0.05 ng/mL or less: NEGATIVE  Repeat testing in 3-6 hrs  if clinically indicated. >0.05 ng/mL: POTENTIAL  MYOCARDIAL INJURY. Repeat  testing in 3-6 hrs if  clinically indicated. NOTE: An increase or decrease  of 30% or more on serial  testing suggests a  clinically important change)  Routine UA:  10-Sep-13 02:41    Color (UA) Yellow   Clarity (UA) Cloudy   Glucose (UA) >=500   Bilirubin (UA) Negative   Ketones (UA) 1+   Specific Gravity (UA) 1.022   Blood (UA) 1+   pH (UA) 5.0   Protein (UA) 30 mg/dL   Nitrite (UA) Positive   Leukocyte Esterase (UA) 3+ (Result(s) reported on 05 Jun 2012 at 02:59AM.)   RBC  (UA) 11 /HPF   WBC (UA) 222 /HPF   Bacteria (UA) 3+   Epithelial Cells (UA) 1 /HPF   WBC Clump (UA) PRESENT   Mucous (UA) PRESENT (Result(s) reported on 05 Jun 2012 at 02:59AM.)  Routine Coag:  10-Sep-13 01:54    Prothrombin 13.4   INR 1.0 (INR reference interval applies to patients on anticoagulant therapy. A single INR therapeutic range for coumarins is not optimal for all indications; however, the suggested range for most indications is 2.0 - 3.0. Exceptions to the INR Reference Range may include: Prosthetic heart valves, acute myocardial infarction, prevention of myocardial infarction, and combinations of aspirin and anticoagulant. The need for a higher or lower target INR must be assessed individually. Reference: The Pharmacology and Management of the Vitamin K  antagonists: the seventh ACCP Conference on Antithrombotic and Thrombolytic Therapy. XFGHW.2993 Sept:126 (3suppl): [id number]. A HCT value >55% may artifactually increase the PT.  In one study,  the increase was an average of 25%. Reference:  "Effect on Routine and Special Coagulation Testing Values of Citrate Anticoagulant Adjustment in Patients with High HCT Values." American Journal of Clinical Pathology 2006;126:400-405.)  Routine Hem:  10-Sep-13 01:54    WBC (CBC)  11.4   RBC (CBC) 4.73   Hemoglobin (CBC) 14.2   Hematocrit (CBC) 42.5   Platelet Count (CBC) 214 (Result(s) reported on 05 Jun 2012 at 02:11AM.)   MCV 90   MCH 30.1   MCHC 33.5   RDW 13.4    09:35    Erythrocyte Sed Rate 17 (Result(s) reported on 05 Jun 2012 at 10:57AM.)   Radiology Results: CT:    10-Sep-13 01:43, CT Head Without Contrast   CT Head Without Contrast    REASON FOR EXAM:    aloc, trauma  COMMENTS:   May transport without cardiac monitor    PROCEDURE: CT  - CT HEAD WITHOUT CONTRAST  - Jun 05 2012  1:43AM     RESULT: Comparison:  None    Technique: Multiple axial images from the foramen  magnum to the vertex   were obtained  without IV contrast.    Findings:      There is no evidence of mass effect, midline shift, or extra-axial fluid   collections.  There is no evidence of a space-occupying lesion or   intracranial hemorrhage. There is no evidence of a cortical-based area of     acute infarction. There is generalized cerebral atrophy. There is   periventricular white matter low attenuation likely secondary to   microangiopathy.    The ventricles and sulci are appropriate for the patient's age. The basal   cisterns are patent.    Visualized portions of the orbits are unremarkable. There is a small left   maxillary sinus air-fluid level.     The osseous structures are unremarkable.    IMPRESSION:      No acute intracranial process.  Dictation Site: 1          Verified By: Jennette Banker, M.D., MD   Radiology Impression:  Radiology Impression: CT personally reviewed by me and shows a old R frontal hypodensity, mild white matter changes   Impression/Recommendations:  Recommendations:   case d/w referring physician reveiwed and essentially normal except for hyperglycemia and UTI   Altered mental status-  agree that this is out of proportion to UTI;  etiology is either R hemispheric infarct vs. seizure due to pt having focal sensory findings that coorelate with abnormalities seen on CT.  Generalized encephalopathy from other medical problems can cause this same focality as well in pts with an old insult. White matter disease-  appears stable Diabetes-  needs better control UTI-  being treated  EEG today agree with MRI of brain check B12, TSH, folate may give trial of anti-epileptics today will follow with you  Electronic Signatures: Jamison Neighbor (MD)  (Signed 10-Sep-13 12:54)  Authored: REFERRING PHYSICIAN, Primary Care Physician, Consult, History of Present Illness, Review of Systems, PAST MEDICAL/SURGICAL HISTORY, HOME MEDICATIONS, ALLERGIES, Social/Family History, NURSING VITAL SIGNS,  Physical Exam-, LAB RESULTS, RADIOLOGY RESULTS, Recommendations   Last Updated: 10-Sep-13 12:54 by Jamison Neighbor (MD)

## 2015-01-13 NOTE — H&P (Signed)
PATIENT NAME:  Maureen Taylor, Maureen Taylor MR#:  [id number] DATE OF BIRTH:  Apr 21, 1935  DATE OF ADMISSION:  06/05/2012  PRIMARY CARE PHYSICIAN: Daniel NonesBert Klein, MD   CHIEF COMPLAINT: Altered mental status.   HISTORY OF PRESENT ILLNESS: Ms. Maureen Taylor is a 80 year old Caucasian female who lives at home alone but active and takes good care of herself independently. She was noticed today by her daughter that she is not herself. She is confused and lethargic The daughter was concerned but because a few days ago she tripped and fell she wants to make sure it is not related. Evaluation here with CAT scan of the head and CT scan of the cervical spine were unremarkable. However, she was found to have significant urinary tract infection which could be the culprit of her altered mental status. Because of her excessive lethargy and altered mental status, the patient was admitted for further evaluation and treatment.   REVIEW OF SYSTEMS: The patient is confused, sleepy and, therefore, 10 point system review was difficult to ascertain and I found that her answers are not reliable.   PAST MEDICAL HISTORY:  1. Last admission here was in May of 2013 with acute diverticulitis.   2. History of diabetes mellitus, on Victoza. However, she stopped this medicine a few weeks ago because she was concerned about what she read regarding side effects.  3. Systemic hypertension. 4. Gastroesophageal reflux disease. 5. Obstructive sleep apnea syndrome but she is intolerant to CPAP.  6. Hyperlipidemia.  7. Obesity.  8. Anxiety and depression. 9. History of osteoarthritis. 10. History of pulmonary nodule.   PAST SURGICAL HISTORY:  1. Cholecystectomy.  2. Hysterectomy.  3. Tubal ligation.  4. Tonsillectomy. 5. Breast reduction. 6. Cervical fusion. 7. Bilateral carpal tunnel release. 8. Left rotator cuff repair. 9. Left total knee arthroplasty. 10. Right total knee arthroplasty.   SOCIAL HISTORY: She is widowed. Lives at home  independently. She retired from working at [employer].   SOCIAL HABITS: Nonsmoker. No history of alcohol or drug abuse.   FAMILY HISTORY: Positive for hypertension, diabetes, lung cancer, colon cancer, and breast cancer.   ADMISSION MEDICATIONS:  1. Zocor 40 mg a day.  2. Prilosec 20 mg a day.  3. Gabapentin 400 mg 3 times a day. 4. Claritin 10 mg a day. 5. Atarax 25 mg p.r.n. for itching.  6. Tylenol with Tramadol 1 tablet q.8 hours p.r.n. for pain.  7. Hydrochlorothiazide with triamterene 25/37.5 mg once a day.  8. Lasix 40 mg once a day p.r.n. if there is leg swelling.  9. Again, she was on Victoza. She has stopped this medication.  10. She is supposed to be on glimepiride 4 mg twice a day. It is unclear if she is taking it or not.   ALLERGIES: Reported allergic to codeine, morphine, and indomethacin.   PHYSICAL EXAMINATION:   VITAL SIGNS: Blood pressure 179/82, respiratory rate 20, pulse 115, temperature 100, oxygen saturation 95%.   GENERAL APPEARANCE: Elderly female laying in bed in no acute distress.   HEAD: No pallor. No icterus. No cyanosis.   EARS, NOSE, AND THROAT: Hearing was normal. Nasal mucosa, lips, tongue were normal.   EYES: Normal eyelids and conjunctivae. Pupils about 6 mm, equal and reactive to light.   NECK: Supple. Trachea at midline. No thyromegaly. No cervical masses.   HEART: Normal S1, S2. No S3, S4. No murmur. No gallop. No carotid bruits.   RESPIRATORY: Normal breathing pattern without use of accessory muscles. No rales. No  wheezing.   ABDOMEN: Soft without tenderness. No hepatosplenomegaly. No masses. No hernias.   SKIN: No ulcers. No subcutaneous nodules.   MUSCULOSKELETAL: No joint swelling. No clubbing.   NEUROLOGIC: Cranial nerves II through XII are intact. No focal motor deficit.   PSYCHIATRIC: The patient is alert, easily arousable but she appears confused and disoriented to the place and time. She does not provide much of history and  she does not know exactly why she is here stating "maybe because of my blood sugar".   LABORATORY, DIAGNOSTIC, AND RADIOLOGICAL DATA: CAT scan of the head showed no acute intracranial abnormality.   CT scan of the cervical spine showed no acute findings.   EKG showed sinus tachycardia, rate of 106 per minute, incomplete right bundle branch block, otherwise unremarkable EKG.  Blood sugar 311, follow-up was 314, BUN 14, creatinine 0.8, sodium 136, potassium 3.9. Alcohol level less than 3. Normal liver function tests except for a slightly low albumin at 3.3. Troponin less than 0.02. CBC showed white count 11,400, hemoglobin 14, hematocrit 42, platelet count 214. Prothrombin time 13. INR 1. Urinalysis showed cloudy urine, more than 500 of glucose, positive nitrite, +3 leukocyte esterase, 222 white blood cells 11, RBCs, +3 bacteria.   ASSESSMENT:  1. Altered mental status.  2. Significant urinary tract infection.  3. Diabetes mellitus with uncontrolled blood sugar.  4. Systemic hypertension, uncontrolled.  5. History of obstructive sleep apnea, intolerant to CPAP. 6. Dyslipidemia. 7. Gastroesophageal reflux disease.   8. Depression. 9. History of osteoarthritis.   PLAN:  1. Will admit the patient to the medical floor with frequent neurologic examination. 2. Blood cultures x2 and urine culture.  3. Intravenous Rocephin pending results of urine culture.  4. Accu-Cheks and insulin sliding scale to control her blood sugar.  5. Gentle IV hydration. Will see her response.  6. Hold Lasix.   I spoke with the daughter and I answered her questions. She tells me that her mother does not have a LIVING WILL. Her CODE STATUS is FULL CODE.   TIME SPENT EVALUATING THIS PATIENT: More than 55 minutes.   ____________________________ Carney Corners. Rudene Re, MD amd:drc D: 06/05/2012 04:15:10 ET T: 06/05/2012 08:47:03 ET JOB#: [id number]  cc: Carney Corners. Rudene Re, MD, <Dictator> Lynnea Ferrier, MD Karolee Ohs Dala Dock  MD ELECTRONICALLY SIGNED 06/05/2012 22:19

## 2015-01-14 DIAGNOSIS — M6281 Muscle weakness (generalized): Secondary | ICD-10-CM | POA: Diagnosis not present

## 2015-01-14 DIAGNOSIS — E119 Type 2 diabetes mellitus without complications: Secondary | ICD-10-CM | POA: Diagnosis not present

## 2015-01-14 DIAGNOSIS — R41841 Cognitive communication deficit: Secondary | ICD-10-CM | POA: Diagnosis not present

## 2015-01-14 DIAGNOSIS — R2681 Unsteadiness on feet: Secondary | ICD-10-CM | POA: Diagnosis not present

## 2015-01-14 DIAGNOSIS — G20 Parkinson's disease: Secondary | ICD-10-CM | POA: Diagnosis not present

## 2015-01-14 DIAGNOSIS — R278 Other lack of coordination: Secondary | ICD-10-CM | POA: Diagnosis not present

## 2015-01-15 DIAGNOSIS — R41841 Cognitive communication deficit: Secondary | ICD-10-CM | POA: Diagnosis not present

## 2015-01-15 DIAGNOSIS — R278 Other lack of coordination: Secondary | ICD-10-CM | POA: Diagnosis not present

## 2015-01-15 DIAGNOSIS — E119 Type 2 diabetes mellitus without complications: Secondary | ICD-10-CM | POA: Diagnosis not present

## 2015-01-15 DIAGNOSIS — R2681 Unsteadiness on feet: Secondary | ICD-10-CM | POA: Diagnosis not present

## 2015-01-15 DIAGNOSIS — M6281 Muscle weakness (generalized): Secondary | ICD-10-CM | POA: Diagnosis not present

## 2015-01-15 DIAGNOSIS — G20 Parkinson's disease: Secondary | ICD-10-CM | POA: Diagnosis not present

## 2015-01-16 DIAGNOSIS — N952 Postmenopausal atrophic vaginitis: Secondary | ICD-10-CM | POA: Diagnosis not present

## 2015-01-16 DIAGNOSIS — G20 Parkinson's disease: Secondary | ICD-10-CM | POA: Diagnosis not present

## 2015-01-16 DIAGNOSIS — E119 Type 2 diabetes mellitus without complications: Secondary | ICD-10-CM | POA: Diagnosis not present

## 2015-01-16 DIAGNOSIS — R300 Dysuria: Secondary | ICD-10-CM | POA: Diagnosis not present

## 2015-01-16 DIAGNOSIS — M6281 Muscle weakness (generalized): Secondary | ICD-10-CM | POA: Diagnosis not present

## 2015-01-16 DIAGNOSIS — R2681 Unsteadiness on feet: Secondary | ICD-10-CM | POA: Diagnosis not present

## 2015-01-16 DIAGNOSIS — R41841 Cognitive communication deficit: Secondary | ICD-10-CM | POA: Diagnosis not present

## 2015-01-16 DIAGNOSIS — R278 Other lack of coordination: Secondary | ICD-10-CM | POA: Diagnosis not present

## 2015-01-16 NOTE — Consult Note (Signed)
Brief Consult Note: Diagnosis: possible left proximal femur fracture.   Patient was seen by consultant.   Recommend further assessment or treatment.   Orders entered.   Comments: compared current xray to one from last year and there is difference CT scan ordered to assess for possible fracture post proximal femur.  Electronic Signatures: Leitha SchullerMenz, Tytus Strahle J (MD)  (Signed 28-Jun-14 13:19)  Authored: Brief Consult Note   Last Updated: 28-Jun-14 13:19 by Leitha SchullerMenz, Lashan Gluth J (MD)

## 2015-01-16 NOTE — Consult Note (Signed)
PATIENT NAME:  Maureen Taylor, Maureen Taylor MR#:  [id number] DATE OF BIRTH:  27-Aug-1935  DATE OF CONSULTATION:  03/24/2013  REFERRING PHYSICIAN:   CONSULTING PHYSICIAN:  Leitha SchullerMichael J. Aaric Dolph, MD  REASON FOR CONSULT:  Possible left proximal femur fracture.   HISTORY OF PRESENT ILLNESS: The patient is a 80 year old who suffered a fall at home, and apparently had been there for over a day. She was confused and was brought to the Emergency Room where she was admitted with a UTI, confusion, rhabdomyolysis and possible left femur fracture. She complained of pain there.  She does have a history of prior knee replacements and right total hip by Dr. Ernest PineHooten, and she normally has been ambulatory. She does suffer from peripheral neuropathy and by her office chart history, it appears she has been having more trouble getting around. On exam, she has some tenderness about the left proximal thigh. Minimal pain with logrolling. Her x-rays show abnormal appearance to the posterior aspect of the proximal femur, possible fracture, which was not present over a year ago on x-rays in the office. Distally, she has diminished sensation secondary to neuropathy. She is able to flex and extend the toes.   CLINICAL IMPRESSION: Possible left proximal femur fracture.   PLAN: A CT scan was obtained. It does not show fracture. There is some heterotopic ossification, probably benign calcification. It is also present on the other hip posteriorly in the same area, along the linea aspera, and at this time, I think we just need to do a followup x-ray in 3 months. Would not recommend MRI at this time. A physical therapy order has been entered for weight-bearing as tolerated, and we will continue to follow her during her stay to make sure she is not having any other acute process in the left hip.   ____________________________ Leitha SchullerMichael J. Raenah Murley, MD mjm:dmm D: 03/24/2013 10:46:51 ET T: 03/24/2013 13:24:38 ET JOB#: [id number]  cc: Leitha SchullerMichael J. Iori Gigante, MD,  <Dictator> Leitha SchullerMICHAEL J Andalyn Heckstall MD ELECTRONICALLY SIGNED 03/24/2013 20:48

## 2015-01-16 NOTE — H&P (Signed)
PATIENT NAME:  Maureen Taylor, Maureen Taylor MR#:  [id number] DATE OF BIRTH:  05/01/1935  DATE OF ADMISSION:  03/22/2013  PRIMARY CARE PHYSICIAN:  Daniel Nones with [facility].   CHIEF COMPLAINT: Fall, confusion.   HISTORY OF PRESENTING ILLNESS: A 80 year old female patient with history of diabetes, chronic back pain, hypertension and sleep apnea, presented to the Emergency Room brought in by EMS after the patient was found fallen down and confused in her shower. The patient last spoke with her daughter 4 days back. Since then, they have been trying to call her but did not pick up the phone. Today, the cleaning lady went into the home and the patient was found fallen in the shower, confused. The patient normally is mobile but on exam has stage 1 decubitus pressure sore and the patient could have been there for a day or longer. Her CK is elevated at 1900. The patient does not contribute to history but was last in the hospital in September 2013 with confusion and E. coli UTI.    REVIEW OF SYSTEMS: Unobtainable secondary to the patient's confusion.   PAST MEDICAL HISTORY 1.  History of diverticulosis.  2.  Diabetes mellitus.  3.  Hypertension.  4.  GERD.  5.  Obstructive sleep apnea, on CPAP.  6.  Hyperlipidemia.  7.  Obesity.  8.  Anxiety, depression.  9.  Osteoarthritis.  10.  Pulmonary nodule.   PAST SURGICAL HISTORY: Cholecystectomy, hysterectomy, tubal ligation, tonsillectomy, breast reduction, cervical fusion, bilateral carpal tunnel release, left rotator cuff repair, right total knee arthroplasty.  SOCIAL HISTORY: The patient is widowed, lives at home independently. Ambulates with a walker. Retired after working at [employer].   FAMILY HISTORY: Positive for hypertension, diabetes, lung cancer, colon cancer, breast cancer.   ALLERGIES: CODEINE, MORPHINE, INDOMETHACIN.  HOME MEDICATIONS:  Include: 1.  Acetaminophen/tramadol 325/37.5, 1 tablet every 8 hours as needed for pain.  2.  Atarax 25 mg  as needed for itching. 3.  Claritin 10 mg oral once a day.   4.  Gabapentin 400 mg oral 3 times a day.  5.  Hydrochlorothiazide/triamterene 25/37.5 mg oral daily.  6.  Lasix 40 mg oral daily.  7.  Metformin 1000 mg oral 2 times a day.  8.  Prilosec OTC 20 mg oral daily.  9.  Zocor 40 mg oral daily.   PHYSICAL EXAMINATION VITAL SIGNS: Temperature 99.4, pulse 126, blood pressure 197/104, saturating 94% on room air.  GENERAL: Morbidly obese Caucasian female patient lying in bed, confused, drowsy. .  PSYCHIATRIC: Drowsy. Mood and affect and judgment cannot be assessed.  HEENT: Atraumatic, normocephalic. Oral mucosa dry and pink. No oral ulcers or thrush. External ears and nose normal. No pallor. No icterus. Pupils bilaterally equal and react to light. NECK: Supple. No thyromegaly. No palpable lymph nodes. Trachea midline. No carotid bruits or  JVD.  CARDIOVASCULAR: S1, S2, without any murmurs. Peripheral pulses 2+, 1+ edema in the lower extremities.  RESPIRATORY: Normal work of breathing. Clear to auscultation on both sides.  GASTROINTESTINAL: Soft abdomen, nontender. Bowel sounds present. No hepatosplenomegaly palpable.  GENITOURINARY: No CVA tenderness, has suprapubic tenderness. No bladder distention. Has a Foley catheter in place with cloudy urine  SKIN: Has pressure sores on the back stage 1 to 2. No other open ulcers found.  MUSCULOSKELETAL: No joint swelling, redness, effusion of the large joints. Normal muscle tone. Seems to have some pain in the left hip.   NEUROLOGICAL: Could not be assessed but seems to  move all 4 extremities symmetrically. Withdraws to pain. Babinski's downgoing. Extraocular muscles intact.  LYMPHATIC: No cervical lymphadenopathy.   LABORATORY  AND IMAGING STUDIES   1.  Glucose of 252, BUN 23, creatinine 0.72, sodium 143, potassium 4.2, chloride 108. Ammonia 45. Alcohol less than 3. AST, ALT, alkaline phosphatase, bilirubin normal.  2.  CK of 1918, troponin less  than 0.02.  3.  Urine drug screen shows tricyclic antidepressant.  4.  WBC 16.7, hemoglobin 16, platelets of 340.  INR 1.1. Urinalysis shows 1+ bacteria and 8 WBC.  5.  ABG shows pH of 7.38, pCO2 41, pO2 108.  6.  EKG shows sinus tachycardia. No acute ST-T wave changes.  7.  CT scan of the head without contrast shows no evidence of acute intracranial hemorrhage. Mild soft tissue swelling over the right parieto-occipital region without evidence of fracture.  8.  CT of the cervical spine shows no acute fracture or dislocation.  9.  CT of chest, abdomen and pelvis shows mildly increased interstitial markings in bilateral lungs, no focal pneumonia. There was possibility for a left subtrochanteric region fracture although could not be seen well.   ASSESSMENT AND PLAN 1.  Acute encephalopathy secondary to urinary tract infection. We will start the patient on ceftriaxone. The last time the patient was in the hospital she had Escherichia coli in urine. The patient does have elevated white count, tachycardia and sepsis on admission.  2.  Falls, syncope, likely secondary from urinary tract infection and could be hypotension. We will check 2 more sets of cardiac enzymes. EKG does not show any acute ST-T wave changes.  3.  Rhabdomyolysis. CK is elevated at 1918. Will start her on IV fluids. This is due to patient lying on the floor for a prolonged period.  4.  Diabetes mellitus. Continue home medications and sliding scale insulin with diabetic diet.  5.  Possible left hip fracture. We will get complete left hip x-rays. If there is any fracture, orthopedics will be consulted.  6.  Hypertension. Continue home medications.  7.  Deep vein thrombosis prophylaxis with Lovenox.  8.  CODE STATUS: Full code.   I have discussed the plan of care with her daughter at bedside.   TIME SPENT TODAY ON THIS CASE: Was 45 minutes.   ____________________________ Molinda BailiffSrikar R. Jennifr Gaeta, MD srs:cs D: 03/22/2013 19:12:00  ET T: 03/22/2013 20:11:52 ET JOB#: [id number]  cc: Wardell HeathSrikar R. Elpidio AnisSudini, MD, <Dictator> Curtis SitesBert J. Klein III, MD Orie FishermanSRIKAR R Preslea Rhodus MD ELECTRONICALLY SIGNED 03/23/2013 2:46

## 2015-01-16 NOTE — Discharge Summary (Signed)
PATIENT NAME:  Maureen Taylor, Maureen Taylor MR#:  [id number] DATE OF BIRTH:  1935/01/08  DATE OF ADMISSION:  03/22/2013 DATE OF DISCHARGE:    FINAL DIAGNOSES:  1.  Rhabdomyolysis.  2.  Urinary tract infection with sepsis.  3.  Dehydration secondary to #2.  4.  Adult onset diabetes mellitus, uncontrolled.  5.  Hypertension.  6.  Gastroesophageal reflux disease.  7.  Hyperlipidemia.  8.  Morbid obesity.  9.  Diabetic peripheral neuropathy.  10. Anxiety and depression.  11. Osteoarthritis.   HISTORY AND PHYSICAL: Please see dictated admission history and physical.   HOSPITAL COURSE: The patient was admitted after having been down in the shower for unknown length of time. She was noted to have a decubitus on her buttocks from this with an abrasion on the back of her head. She also complained of left hip pain. She underwent films in the Emergency Room, which revealed question of left hip fracture with hydrotropic bone. She subsequently underwent CT scan, which did not reveal obvious fracture. She was seen by orthopedics, who did not feel this was a fracture, but recommend repeating her films in approximately 3 months. She began working with physical therapy and made slow progress, complaining of some pain in her hip, although there was also a portion of her depression, which affected her mobility. Fortunately, this improved throughout her hospitalization. She was found to have evidence of urinary tract infection, E. coli. She was placed on antibiotics and was switched over to amoxicillin based on the sensitivity patterns. One of two blood cultures was positive for two different species of staph, coag negative, and this is felt to be a skin contaminant. She was found to have evidence of sepsis by elevation of white blood cell count, hypotension on arrival and this slowly improved, and her white blood cell count remains mildly elevated at time of discharge.   She was placed back on Cymbalta and sertraline, which  psychiatry placed her on previously and she appeared to put up with this. It was recommended that psychiatry continue to work with the patient. 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 At this point, she will not be placed on aspirin or simvastatin until her hydration status improves, it is recommended that these medicines be restarted in approximately 1 week. Of note, she had been on a dose of 40 mg of simvastatin daily.   It was clear that she would not be able to return to her previous level of living, in fact, probably does not need to be on her own at any point in the future. Family members are anticipating taking her up to [place] where they have more family support; however, it is recommended that she go to rehabilitation first and they were uncomfortable with this plan. At this time, a bed search began, and the patient will be discharged to a nursing facility in stable condition with physical activity to be up with assistance as tolerated. She should follow a no added salt, no concentrated sweets diet. Blood sugar should be checked 4 times a day, with sliding scale as per her orders. She will continue to have dry dressings over the wound on the buttock to be changed as necessary. Will anticipate a followup with orthopedics and psychiatry in the next couple of weeks. Physical therapy and occupational therapy should evaluate and treat the patient. She will need a MET-B, urinalysis, and CBC in 1 week with results to the nursing  home physician.   DISCHARGE MEDICATIONS:  1.  Glyburide 4 mg p.o. b.i.d.  2.  Cymbalta 60 mg p.o. daily. 3.  Oxybutynin ER 5 mg p.o. daily.  4.  Sinemet 25/100, 1 p.o. b.i.d. for Parkinson's disease.  5.  Sertraline 50 mg p.o. at bedtime.  6.  Claritin 10 mg p.o. daily for allergies.  7.  Losartan 50 mg p.o. daily.  8.  Sliding scale insulin.  9.  Ambien 5 mg p.o. at bedtime as needed for sleep.  10. Lopressor 25 mg p.o.  b.i.d.  11. Amoxicillin 875 mg p.o. b.i.d. x 5 days.  12.  Mupirocin 2% cream to be applied topically to scalp lesion every 8 hours as needed.  13. Pantoprazole 40 mg p.o. daily.  14. Neurontin 300 mg p.o. at bedtime for peripheral neuropathy.   CODE STATUS: The patient is a full code.  ____________________________ Adin Hector, MD bjk:aw D: 03/27/2013 07:46:30 ET T: 03/27/2013 08:17:34 ET JOB#: [id number]  cc: Adin Hector, MD, <Dictator> Ramonita Lab MD ELECTRONICALLY SIGNED 03/30/2013 7:46

## 2015-01-17 NOTE — H&P (Signed)
PATIENT NAME:  Maureen Taylor, Maureen Taylor MR#:  [id number] DATE OF BIRTH:  04/12/1935  DATE OF ADMISSION:  05/30/2014  CHIEF COMPLAINT: Dysuria, confusion.  REFERRING PHYSICIAN: Dr. .   HISTORY OF PRESENT ILLNESS: Maureen Taylor is a 80 year old female with a history of hypertension, diabetes mellitus, obstructive sleep apnea, is brought to the Emergency Department for confusion, dysuria, started for the last 2 days. The patient was noted to have hyperglycemia. Concerning this, urinalysis was checked which was normal 2 days back; however, the patient started to experience dysuria since last night, started to notice fever. This afternoon the patient was found to be lethargic. Concerning this, the patient was brought to the Emergency Department.  WORKUP IN THE EMERGENCY DEPARTMENT: UA is strongly positive for urinary tract infection. Lactic acid is 2.4, has elevated white blood cell count of 25,000. The patient came to the Emergency Department on 05/27/2014. Urine cultures were obtained, now growing more than 100,000 colonies of Klebsiella pneumoniae sensitive to quinolones and Bactrim. Unable to obtain any history from the patient as the patient seems to be lethargic. States has mild cough with no productive sputum.   PAST MEDICAL HISTORY:  1. Diabetes mellitus, insulin-dependent.  2. Hypertension.  3. Obesity.  4. Gastroesophageal reflux disease.  5. Obstructive sleep apnea noncompliant with CPAP.  6. Hyperlipidemia.  7. Obesity.  8. Anxiety, depression.  9. Osteoarthritis.  10. Pulmonary nodule.   PAST SURGICAL HISTORY:  1. Cholecystectomy.  2. Hysterectomy.  3. Tubal ligation.  4. Tonsillectomy.  5. Breast reduction.  6. Cervical fusion.  7. Bilateral carpal tunnel release. 8. Left rotator cuff repair. 9. Right total knee arthroplasty.   ALLERGIES:  1. Codeine.  2. Morphine.  3. Indomethacin.   HOME MEDICATIONS:  1. Protonix 40 mg once a day.  2. Oxybutynin 1 tablet once a day.  3. .   4. Mucinex 600 mg 2 times a day.  5. Mintox.  6. Milk of magnesia 30 mL once a day.  7. Metoprolol 25 mg 2 times a day.  8. Losartan 50 mg once a day.  9. Loratadine 10 mg once a day.  10. Lantus 26 units at bedtime.  11. Lamotrigine 1 tablet once a day.  12. Humalog on sliding scale insulin.  13. Glimepiride 4 mg 1 tablet once a day.  14. Gabapentin 300 mg at bedtime.  15. Duloxetine 60 mg once a day.  16. Bisacodyl 10 mg rectal once.  17. Tylenol 650 mg every 4 hours as needed.   SOCIAL HISTORY: Widowed, lives at [facility], ambulated with the help of a walker in the past, retired after working at [employer].   FAMILY HISTORY: Positive for hypertension, diabetes mellitus, lung cancer, colon cancer, and breast cancer.   REVIEW OF SYSTEMS: Could not be obtained as the patient has periods of confusion.   PHYSICAL EXAMINATION: GENERAL: This is a well-built, well-nourished, obese, female lying down in the bed, lethargic.  VITAL SIGNS: Temperature was 97.8, pulse 110, blood pressure 121/55, respiratory rate of 20, oxygen saturation was 94% on room air.  HEENT: Head normocephalic, atraumatic. No scleral icterus. Conjunctivae normal. Pupils equal and react to light. Mucous membranes: Mild dryness. Could not examine the oropharynx. NECK: Thick neck. Supple. No lymphadenopathy. No JVD. No carotid bruits. CHEST: There is no focal tenderness.  LUNGS: Decreased breath sounds in the lower lobes.  CARDIOVASCULAR: S1, S2, regular. No murmurs are heard. Tachycardia.  ABDOMEN: Obese. Bowel sounds are present, soft, nontender, nondistended.  EXTREMITIES: No pedal edema. Pulses 2+.  NEUROLOGIC: The patient is somnolent, lethargic. Oriented to self, place, person, but not to the time. No apparent cranial nerve abnormalities. Could not examine the motor and sensory.   ASSESSMENT AND PLAN: Maureen Taylor is a 80 year old female who comes with sepsis with urinary tract infection. Sepsis secondary to  Klebsiella pneumonia, grew from urine cultures from 05/27/2014. Keep the patient on ciprofloxacin IV. Continue to follow up with CBC, also blood culture.  1. Urinary tract infection. The patient has recurrent urinary tract infections. Current organism is Klebsiella pneumonia. Continue with ciprofloxacin. Once the patient's WBCs start trending down, can be changed to oral regimen. Treat for a total of 2 weeks, treating as a complicated pneumonia considering the patient's frequent urinary tract infections and diabetes mellitus.  2. Severe debility. Continue with physical therapy, occupational therapy.  3. Diabetes mellitus. We will decrease the dose of the Lantus to 15 units considering the patient decreased p.o. intake.  4. Hypertension: Currently well controlled. Continue with the home medications.  5. Obstructive sleep apnea. The patient's daughter states the patient is noncompliant with the CPAP. Will need to be emphasized.  6. Keep the patient on deep vein thrombosis prophylaxis with Lovenox.   TIME SPENT: 50 minutes.     ____________________________ Susa GriffinsPadmaja Torii Royse, MD pv:lt D: 05/30/2014 21:49:14 ET T: 05/30/2014 22:42:04 ET JOB#: [id number]  cc: Susa GriffinsPadmaja Romar Woodrick, MD, <Dictator> Clerance LavPADMAJA Glendale Youngblood MD ELECTRONICALLY SIGNED 05/31/2014 21:00

## 2015-01-17 NOTE — Discharge Summary (Signed)
PATIENT NAME:  Maureen Taylor, Maureen Taylor MR#:  [id number] DATE OF BIRTH:  April 21, 1935  DATE OF ADMISSION:  05/30/2014 DATE OF DISCHARGE:    For a detailed note, please look at the history and physical done initially by Dr. Lunette Stands.   DIAGNOSES AT DISCHARGE AS FOLLOWS: 1. Sepsis secondary to urinary tract infection. 2. Urinary tract infection. 3. Parkinson disease.  4. Diabetes.  5. Diabetic neuropathy.  6. Hypertension.  7. Gastroesophageal reflux disease.   DIET: The patient is being discharged on a low-sodium, low-fat, American Diabetic Association diet.   ACTIVITY: As tolerated.   FOLLOW-UP: With the primary care physician at the skilled nursing facility.   DISCHARGE MEDICATIONS: Losartan 50 mg daily, metoprolol tartrate 25 mg b.i.d., Protonix 40 mg daily, Lantus 26 units at bedtime, oxybutynin 10 mg daily, milk of magnesia daily as needed, Humalog subcutaneous 4 times daily per sliding scale, carbidopa/levodopa 25/100 one tablet b.i.d., Dulcolax suppository 10 mg as needed, Flonase 2 sprays to each nostril daily, Tylenol 650 q.4 h. as needed, saline mist spray 4 times daily. Multivitamin daily. Mucinex 600 mg b.i.d., loratadine 10 mg daily,  Lamictal 50 mg at bedtime, glimiperide 4 mg daily, gabapentin 300 mg at bedtime, Cymbalta 60 mg daily, Ceftin 250 mg b.i.d. x 5 days, and nystatin to be applied to the abdominal folds b.i.d.   PERTINENT STUDIES DONE DURING THE HOSPITAL COURSE ARE AS FOLLOWS: A chest x-ray done on admission showing no evidence of acute cardiopulmonary process.   Blood cultures noted to be negative.   Urine cultures on September 5th to be positive for Klebsiella pneumonia. Urine cultures done on September 5 showing 100,000 colonies of gram-negative rod, but it has not been identified yet.   HOSPITAL COURSE: This is a 80 year old female, who presented to the hospital with fever, tachycardia and leukocytosis, and noted to have a urinary tract infection.   1. Sepsis. The  patient met sepsis criteria as she presented with fever, leukocytosis and tachycardia. The likely source was a urinary tract infection. The patient's urinalysis was positive. Her urine cultures a few days prior to admission did show 100,000 colonies of Klebsiella pneumonia. The patient was treated with IV ceftriaxone after being on IV antibiotics. Her clinical symptoms have improved. She has had no further fevers. Her white cell count has come down. She is afebrile and hemodynamically stable, and, therefore, she is being discharged on oral Ceftin. Her blood cultures have remained negative. Her repeat urine culture is growing 100,000 colonies of gram-negative rod, which has not been identified yet.  2. Urinary tract infection. This was likely the cause of the patient's sepsis. The patient has been treated adequately with IV ceftriaxone and is currently being discharged on oral Ceftin presently. Her urine cultures, as mentioned, were positive for Klebsiella and has been sensitive to ceftriaxone. 3. Leukocytosis. This was secondary to the urinary tract infection. It has improved and normalized now with IV antibiotic therapy.  4. History of Parkinson's disease. The patient was maintained on Sinemet. She will resume that. 5. Diabetes. The patient had no hypoglycemic episode. She will continue her Levemir, glimepiride and sliding scale insulin coverage as stated. 6. GERD. The patient was maintained on Protonix. She will resume that.  7. Hypertension. The patient remained hemodynamically stable. She will continue her metoprolol and losartan.  8. Depression. The patient was maintained on Cymbalta. She will resume that.  9. Urinary incontinence. The patient was maintained on oxybutynin, and she will also resume that upon discharge.   The  patient is being discharged back to her skilled nursing facility at [facility].   TIME SPENT ON DISCHARGE: 40 minutes.    ____________________________ Belia Heman. Verdell Carmine,  MD vjs:JT D: 06/01/2014 12:39:17 ET T: 06/01/2014 13:12:28 ET JOB#: [id number]  cc: Belia Heman. Verdell Carmine, MD, <Dictator>  Henreitta Leber MD ELECTRONICALLY SIGNED 06/04/2014 15:59

## 2015-01-18 NOTE — H&P (Signed)
PATIENT NAME:  Maureen Taylor, HEINEY MR#:  [id number] DATE OF BIRTH:  Nov 15, 1934  DATE OF ADMISSION:  01/31/2012  PRIMARY CARE PHYSICIAN: Daniel Nones, MD, [facility]   CHIEF COMPLAINT: Abdominal pain.  SUBJECTIVE: This is a morbidly obese 80 year old female who presented to the ER with a chief complaint of left lower quadrant pain. The pain started on Friday associated with some diarrhea. On Friday the diarrhea was profuse and the patient states that she had several bowel movements which were described as watery, non-foul smelling without any evidence of melena or hematochezia. The patient denied any nausea or vomiting associated with it. The patient also described her abdominal pain as severe abdominal cramping similar to delivering a baby. The patient's pain is somewhat improved now after receiving pain medicines in the ER. A CT scan confirmed acute sigmoid diverticulitis without any abscess or perforation. The patient was also found to have a urinary tract infection.   The patient states that she was treated for a urinary tract infection by her primary care provider about a month ago, however, she has continued to have some urinary symptoms of urgency, frequency, and dysuria which have been persistent. She denies any chest pain or any shortness of breath.   PAST MEDICAL HISTORY: 1. History of asthma. 2. Osteoarthritis. 3. Depression. 4. Diabetes. 5. Hypertension. 6. Hernia. 7. Sleep apnea, intolerant to CPAP. 8. Pulmonary nodule. 9. Dyslipidemia. 10. Gastroesophageal reflux disease.  PAST SURGICAL HISTORY: 1. Tonsillectomy. 2. Tubal ligation. 3. Breast reduction. 4. Right lower extremity nerve release. 5. Cholecystectomy. 6. Cervical fusion. 7. Bilateral carpal tunnel release. 8. Left rotator cuff repair. 9. Hysterectomy. 10. Left total knee arthroplasty. 11. Right total knee arthroplasty.  SOCIAL HISTORY: She is widowed. She is retired from [employer]. No alcohol. No tobacco use.    FAMILY HISTORY: Positive for hypertension, diabetes, colon cancer, lung cancer, stomach cancer, and breast cancer.   ALLERGIES: Indocin, codeine, and morphine.  HOME MEDICATIONS: 1. Ultracet 1 to 2 tablets as needed. 2. Aspirin 325 p.o. daily. 3. Atarax 25 mg q.12 hours as needed. 4. Dulcolax 10 mg suppository as needed. 5. Caltrate with Vitamin D. 6. Claritin 10 mg p.o. daily. 7. Lasix 40 mg p.o. every now and then as needed. 8. HCTZ/triamterene 25/37.5 one tablet p.o. daily. 9. Metformin 1000 mg p.o. twice a day. 10. Multivitamin one tablet p.o. daily.  11. Sliding scale insulin. 12. Zocor 40 mg p.o. daily.  REVIEW OF SYSTEMS: CONSTITUTIONAL: No fever. No fatigue. Denies any weight loss or weight gain. EYES: Denies any blurry vision, double vision, inflammation, or glaucoma. ENT: Denies any tinnitus, ear pain, hearing loss, or difficulty swallowing. RESPIRATORY: Denies any cough, wheezing, hemoptysis, or dyspnea. CARDIOVASCULAR: Denies any chest pain, orthopnea, edema, or arrhythmia. GI: Denies any nausea or vomiting. Does have diarrhea as described above and left lower quadrant abdominal pain. GU: Has some dysuria. No hematuria. Increased urinary frequency is present. ENDOCRINE: Denies any polyuria, nocturia, thyroid problems, or increased sweating. HEME: Denies any easy bruising or bleeding. NEUROLOGIC: Denies any numbness, weakness, dysarthria, or epilepsy. PSYCHIATRIC: Denies any anxiety, insomnia, bipolar disorder, or nervousness.   PHYSICAL EXAMINATION:  VITAL SIGNS: Blood pressure 133/78, pulse 90, temperature 97.3.   GENERAL: Currently comfortable in no acute cardiopulmonary distress.   HEENT: Pupils equal and reactive. Extraocular movements intact.   NECK: Supple. No JVD.   LUNGS: Clear to auscultation bilaterally. No wheezes, no crackles, no rhonchi.  CARDIOVASCULAR: Regular rate and rhythm. No murmurs, rubs, or gallops.  ABDOMEN: Soft but tender to palpation in the  left lower quadrant without any rebound or guarding.   NEUROLOGIC: Cranial nerves II through XII grossly intact. Motor strength intact in bilateral upper and lower extremities. Deep tendon reflexes 2+ bilaterally.  PSYCHIATRIC: Appropriate mood and affect.   SKIN: Without any skin rashes.  EXTREMITIES: Without cyanosis, clubbing, or edema.  LABORATORY, DIAGNOSTIC, AND RADIOLOGICAL DATA: CT scan of the abdomen shows acute sigmoid diverticulitis without any perforation or abscess.   Urinalysis shows 3+ leukocyte esterase, 149 WBCs per high-power field. WBC 15.9, hemoglobin 13.6, hematocrit 41.1, platelet count 293, serum glucose 93, BUN 15, creatinine 0.95, sodium 141, potassium 3.6, chloride 104, bicarb 27, total bilirubin 0.7, AST 22, ALT 23, total protein 7.6, anion gap 10.    ASSESSMENT AND PLAN:  1. Acute sigmoid diverticulitis. The patient will be kept on a clear liquid diet. Given the fact that she has diarrhea, we will also send off some stool studies. She will be started on ciprofloxacin and Flagyl IV. She had a colonoscopy last year that showed colon polyps and sigmoid diverticulosis. No obvious masses. This is her first episode and I doubt urgent repeat colonoscopy is indicated in the outpatient setting at this time.  2. Diabetes. We will hold metformin and start the patient on sliding scale insulin along with Accu-Cheks q.a.c. and at bedtime.  3. Hypertension. The patient takes Lasix as needed and HCTZ/triamterene at home. She is normotensive and appears to be clinically dehydrated and, therefore, these will be held and the patient will be hydrated with IV fluids.  4. Urinary tract infection. Urine culture will be sent and the patient will be started on IV ciprofloxacin.  5. Lovenox for DVT prophylaxis.  CODE STATUS: She is a FULL CODE.  ____________________________ Richarda Overlie Crystalmarie Yasin, MD na:drc D: 01/31/2012 00:46:52 ET T: 01/31/2012 06:24:28 ET JOB#: [id number]  cc: Richarda Overlie  Lenig, MD,  <Dictator> Lynnea FerrierBert J. Klein III, MD Richarda OverlieNAYANA Jaylene Schrom MD ELECTRONICALLY SIGNED 03/07/2012 0:12

## 2015-01-18 NOTE — Discharge Summary (Signed)
PATIENT NAME:  Maureen Taylor, Maureen Taylor MR#:  [id number] DATE OF BIRTH:  [birth year]  DATE OF ADMISSION:  01/31/2012 DATE OF DISCHARGE:  02/02/2012  FINAL DIAGNOSES:  1. Diverticulitis.  2. Diabetes mellitus, uncontrolled.  3. Depression with anxiety.  4. Hypertension.  5. Gastroesophageal reflux disease.  6. Sleep apnea, intolerant of continuous positive airway pressure.  7. Hyperlipidemia.   HISTORY AND PHYSICAL: Please see the dictated admission history and physical.   HOSPITAL COURSE: Patient was admitted with abdominal pain, was unable to eat because of it. CT was performed, which revealed evidence of diverticulitis. She was placed on Cipro and Flagyl, with gradual improvement. She had leukocytosis on admission, and this resolved over the next several days. She was switched over to oral antibiotics and tolerated these well. Her diet was advanced to low residue, and she was able to tolerate this as well. At this time, she will be discharged to home in stable condition with her physical activity up as tolerated. She should check her sugars daily and record this. Her diet should be 1800 calorie ADA, with low residue diet for one week. She will follow up in our office within the next 1 to 2 weeks.   DISCHARGE MEDICATIONS:  1. Hydroxyzine 25 mg p.o. Taylor.i.d. p.r.n. itching.  2. Simvastatin 40 mg p.o. at bedtime.  3. Omeprazole 20 mg p.o. daily.  4. Cipro 500 mg p.o. Taylor.i.d. x7 days.  5. Flagyl 500 mg p.o. t.i.d. x7 days.  6. Ultracet 1 p.o. q.8 hours p.r.n. severe pain.  7. Loratadine 10 mg p.o. daily. 8. Maxzide 25/37.5 mg 1 p.o. daily.  9. Metformin 1000 mg p.o. Taylor.i.d.  10. Glimepiride 4 mg p.o. Taylor.i.d.  11. Victoza 1.8 mcg injected daily.  12. Gabapentin 400 mg p.o. t.i.d.   ____________________________ Lynnea FerrierBert J. Klein III, MD bjk:cms D: 02/02/2012 08:06:34 ET T: 02/02/2012 15:05:24 ET JOB#: [id number]  cc: Lynnea FerrierBert J. Klein III, MD, <Dictator> Daniel NonesBERT KLEIN MD ELECTRONICALLY SIGNED 02/08/2012 7:27

## 2015-01-19 DIAGNOSIS — R278 Other lack of coordination: Secondary | ICD-10-CM | POA: Diagnosis not present

## 2015-01-19 DIAGNOSIS — R2681 Unsteadiness on feet: Secondary | ICD-10-CM | POA: Diagnosis not present

## 2015-01-19 DIAGNOSIS — E119 Type 2 diabetes mellitus without complications: Secondary | ICD-10-CM | POA: Diagnosis not present

## 2015-01-19 DIAGNOSIS — R41841 Cognitive communication deficit: Secondary | ICD-10-CM | POA: Diagnosis not present

## 2015-01-19 DIAGNOSIS — M6281 Muscle weakness (generalized): Secondary | ICD-10-CM | POA: Diagnosis not present

## 2015-01-19 DIAGNOSIS — G20 Parkinson's disease: Secondary | ICD-10-CM | POA: Diagnosis not present

## 2015-01-20 DIAGNOSIS — M25569 Pain in unspecified knee: Secondary | ICD-10-CM | POA: Diagnosis not present

## 2015-01-21 DIAGNOSIS — R2681 Unsteadiness on feet: Secondary | ICD-10-CM | POA: Diagnosis not present

## 2015-01-21 DIAGNOSIS — M6281 Muscle weakness (generalized): Secondary | ICD-10-CM | POA: Diagnosis not present

## 2015-01-21 DIAGNOSIS — R278 Other lack of coordination: Secondary | ICD-10-CM | POA: Diagnosis not present

## 2015-01-21 DIAGNOSIS — R41841 Cognitive communication deficit: Secondary | ICD-10-CM | POA: Diagnosis not present

## 2015-01-21 DIAGNOSIS — G20 Parkinson's disease: Secondary | ICD-10-CM | POA: Diagnosis not present

## 2015-01-21 DIAGNOSIS — E119 Type 2 diabetes mellitus without complications: Secondary | ICD-10-CM | POA: Diagnosis not present

## 2015-01-22 DIAGNOSIS — E119 Type 2 diabetes mellitus without complications: Secondary | ICD-10-CM | POA: Diagnosis not present

## 2015-01-22 DIAGNOSIS — R278 Other lack of coordination: Secondary | ICD-10-CM | POA: Diagnosis not present

## 2015-01-22 DIAGNOSIS — R41841 Cognitive communication deficit: Secondary | ICD-10-CM | POA: Diagnosis not present

## 2015-01-22 DIAGNOSIS — G20 Parkinson's disease: Secondary | ICD-10-CM | POA: Diagnosis not present

## 2015-01-22 DIAGNOSIS — M6281 Muscle weakness (generalized): Secondary | ICD-10-CM | POA: Diagnosis not present

## 2015-01-22 DIAGNOSIS — R2681 Unsteadiness on feet: Secondary | ICD-10-CM | POA: Diagnosis not present

## 2015-01-23 DIAGNOSIS — R278 Other lack of coordination: Secondary | ICD-10-CM | POA: Diagnosis not present

## 2015-01-23 DIAGNOSIS — G20 Parkinson's disease: Secondary | ICD-10-CM | POA: Diagnosis not present

## 2015-01-23 DIAGNOSIS — R2681 Unsteadiness on feet: Secondary | ICD-10-CM | POA: Diagnosis not present

## 2015-01-23 DIAGNOSIS — R41841 Cognitive communication deficit: Secondary | ICD-10-CM | POA: Diagnosis not present

## 2015-01-23 DIAGNOSIS — M6281 Muscle weakness (generalized): Secondary | ICD-10-CM | POA: Diagnosis not present

## 2015-01-23 DIAGNOSIS — E119 Type 2 diabetes mellitus without complications: Secondary | ICD-10-CM | POA: Diagnosis not present

## 2015-01-26 DIAGNOSIS — K219 Gastro-esophageal reflux disease without esophagitis: Secondary | ICD-10-CM | POA: Diagnosis not present

## 2015-01-26 DIAGNOSIS — I10 Essential (primary) hypertension: Secondary | ICD-10-CM | POA: Diagnosis not present

## 2015-01-26 DIAGNOSIS — R278 Other lack of coordination: Secondary | ICD-10-CM | POA: Diagnosis not present

## 2015-01-26 DIAGNOSIS — G20 Parkinson's disease: Secondary | ICD-10-CM | POA: Diagnosis not present

## 2015-01-26 DIAGNOSIS — R1312 Dysphagia, oropharyngeal phase: Secondary | ICD-10-CM | POA: Diagnosis not present

## 2015-01-26 DIAGNOSIS — R2681 Unsteadiness on feet: Secondary | ICD-10-CM | POA: Diagnosis not present

## 2015-01-26 DIAGNOSIS — R41841 Cognitive communication deficit: Secondary | ICD-10-CM | POA: Diagnosis not present

## 2015-01-27 DIAGNOSIS — G20 Parkinson's disease: Secondary | ICD-10-CM | POA: Diagnosis not present

## 2015-01-27 DIAGNOSIS — R2681 Unsteadiness on feet: Secondary | ICD-10-CM | POA: Diagnosis not present

## 2015-01-27 DIAGNOSIS — K219 Gastro-esophageal reflux disease without esophagitis: Secondary | ICD-10-CM | POA: Diagnosis not present

## 2015-01-27 DIAGNOSIS — I10 Essential (primary) hypertension: Secondary | ICD-10-CM | POA: Diagnosis not present

## 2015-01-27 DIAGNOSIS — R278 Other lack of coordination: Secondary | ICD-10-CM | POA: Diagnosis not present

## 2015-01-27 DIAGNOSIS — R1312 Dysphagia, oropharyngeal phase: Secondary | ICD-10-CM | POA: Diagnosis not present

## 2015-01-28 DIAGNOSIS — R1312 Dysphagia, oropharyngeal phase: Secondary | ICD-10-CM | POA: Diagnosis not present

## 2015-01-28 DIAGNOSIS — R278 Other lack of coordination: Secondary | ICD-10-CM | POA: Diagnosis not present

## 2015-01-28 DIAGNOSIS — K219 Gastro-esophageal reflux disease without esophagitis: Secondary | ICD-10-CM | POA: Diagnosis not present

## 2015-01-28 DIAGNOSIS — I10 Essential (primary) hypertension: Secondary | ICD-10-CM | POA: Diagnosis not present

## 2015-01-28 DIAGNOSIS — G20 Parkinson's disease: Secondary | ICD-10-CM | POA: Diagnosis not present

## 2015-01-28 DIAGNOSIS — R2681 Unsteadiness on feet: Secondary | ICD-10-CM | POA: Diagnosis not present

## 2015-01-29 DIAGNOSIS — I10 Essential (primary) hypertension: Secondary | ICD-10-CM | POA: Diagnosis not present

## 2015-01-29 DIAGNOSIS — K219 Gastro-esophageal reflux disease without esophagitis: Secondary | ICD-10-CM | POA: Diagnosis not present

## 2015-01-29 DIAGNOSIS — R1312 Dysphagia, oropharyngeal phase: Secondary | ICD-10-CM | POA: Diagnosis not present

## 2015-01-29 DIAGNOSIS — R278 Other lack of coordination: Secondary | ICD-10-CM | POA: Diagnosis not present

## 2015-01-29 DIAGNOSIS — R2681 Unsteadiness on feet: Secondary | ICD-10-CM | POA: Diagnosis not present

## 2015-01-29 DIAGNOSIS — G20 Parkinson's disease: Secondary | ICD-10-CM | POA: Diagnosis not present

## 2015-01-30 DIAGNOSIS — I10 Essential (primary) hypertension: Secondary | ICD-10-CM | POA: Diagnosis not present

## 2015-01-30 DIAGNOSIS — R2681 Unsteadiness on feet: Secondary | ICD-10-CM | POA: Diagnosis not present

## 2015-01-30 DIAGNOSIS — R278 Other lack of coordination: Secondary | ICD-10-CM | POA: Diagnosis not present

## 2015-01-30 DIAGNOSIS — K219 Gastro-esophageal reflux disease without esophagitis: Secondary | ICD-10-CM | POA: Diagnosis not present

## 2015-01-30 DIAGNOSIS — R1312 Dysphagia, oropharyngeal phase: Secondary | ICD-10-CM | POA: Diagnosis not present

## 2015-01-30 DIAGNOSIS — G20 Parkinson's disease: Secondary | ICD-10-CM | POA: Diagnosis not present

## 2015-02-02 DIAGNOSIS — R278 Other lack of coordination: Secondary | ICD-10-CM | POA: Diagnosis not present

## 2015-02-02 DIAGNOSIS — G20 Parkinson's disease: Secondary | ICD-10-CM | POA: Diagnosis not present

## 2015-02-02 DIAGNOSIS — K219 Gastro-esophageal reflux disease without esophagitis: Secondary | ICD-10-CM | POA: Diagnosis not present

## 2015-02-02 DIAGNOSIS — R2681 Unsteadiness on feet: Secondary | ICD-10-CM | POA: Diagnosis not present

## 2015-02-02 DIAGNOSIS — I10 Essential (primary) hypertension: Secondary | ICD-10-CM | POA: Diagnosis not present

## 2015-02-02 DIAGNOSIS — R1312 Dysphagia, oropharyngeal phase: Secondary | ICD-10-CM | POA: Diagnosis not present

## 2015-02-03 DIAGNOSIS — G20 Parkinson's disease: Secondary | ICD-10-CM | POA: Diagnosis not present

## 2015-02-03 DIAGNOSIS — I10 Essential (primary) hypertension: Secondary | ICD-10-CM | POA: Diagnosis not present

## 2015-02-03 DIAGNOSIS — R2681 Unsteadiness on feet: Secondary | ICD-10-CM | POA: Diagnosis not present

## 2015-02-03 DIAGNOSIS — R278 Other lack of coordination: Secondary | ICD-10-CM | POA: Diagnosis not present

## 2015-02-03 DIAGNOSIS — K219 Gastro-esophageal reflux disease without esophagitis: Secondary | ICD-10-CM | POA: Diagnosis not present

## 2015-02-03 DIAGNOSIS — R1312 Dysphagia, oropharyngeal phase: Secondary | ICD-10-CM | POA: Diagnosis not present

## 2015-02-04 DIAGNOSIS — R2681 Unsteadiness on feet: Secondary | ICD-10-CM | POA: Diagnosis not present

## 2015-02-04 DIAGNOSIS — K219 Gastro-esophageal reflux disease without esophagitis: Secondary | ICD-10-CM | POA: Diagnosis not present

## 2015-02-04 DIAGNOSIS — R1312 Dysphagia, oropharyngeal phase: Secondary | ICD-10-CM | POA: Diagnosis not present

## 2015-02-04 DIAGNOSIS — G20 Parkinson's disease: Secondary | ICD-10-CM | POA: Diagnosis not present

## 2015-02-04 DIAGNOSIS — R278 Other lack of coordination: Secondary | ICD-10-CM | POA: Diagnosis not present

## 2015-02-04 DIAGNOSIS — I10 Essential (primary) hypertension: Secondary | ICD-10-CM | POA: Diagnosis not present

## 2015-02-05 DIAGNOSIS — R1312 Dysphagia, oropharyngeal phase: Secondary | ICD-10-CM | POA: Diagnosis not present

## 2015-02-05 DIAGNOSIS — R2681 Unsteadiness on feet: Secondary | ICD-10-CM | POA: Diagnosis not present

## 2015-02-05 DIAGNOSIS — I10 Essential (primary) hypertension: Secondary | ICD-10-CM | POA: Diagnosis not present

## 2015-02-05 DIAGNOSIS — R278 Other lack of coordination: Secondary | ICD-10-CM | POA: Diagnosis not present

## 2015-02-05 DIAGNOSIS — G20 Parkinson's disease: Secondary | ICD-10-CM | POA: Diagnosis not present

## 2015-02-05 DIAGNOSIS — K219 Gastro-esophageal reflux disease without esophagitis: Secondary | ICD-10-CM | POA: Diagnosis not present

## 2015-02-06 DIAGNOSIS — K219 Gastro-esophageal reflux disease without esophagitis: Secondary | ICD-10-CM | POA: Diagnosis not present

## 2015-02-06 DIAGNOSIS — G20 Parkinson's disease: Secondary | ICD-10-CM | POA: Diagnosis not present

## 2015-02-06 DIAGNOSIS — R278 Other lack of coordination: Secondary | ICD-10-CM | POA: Diagnosis not present

## 2015-02-06 DIAGNOSIS — R2681 Unsteadiness on feet: Secondary | ICD-10-CM | POA: Diagnosis not present

## 2015-02-06 DIAGNOSIS — I10 Essential (primary) hypertension: Secondary | ICD-10-CM | POA: Diagnosis not present

## 2015-02-06 DIAGNOSIS — R1312 Dysphagia, oropharyngeal phase: Secondary | ICD-10-CM | POA: Diagnosis not present

## 2015-02-09 DIAGNOSIS — G20 Parkinson's disease: Secondary | ICD-10-CM | POA: Diagnosis not present

## 2015-02-09 DIAGNOSIS — R2681 Unsteadiness on feet: Secondary | ICD-10-CM | POA: Diagnosis not present

## 2015-02-09 DIAGNOSIS — R1312 Dysphagia, oropharyngeal phase: Secondary | ICD-10-CM | POA: Diagnosis not present

## 2015-02-09 DIAGNOSIS — I10 Essential (primary) hypertension: Secondary | ICD-10-CM | POA: Diagnosis not present

## 2015-02-09 DIAGNOSIS — K219 Gastro-esophageal reflux disease without esophagitis: Secondary | ICD-10-CM | POA: Diagnosis not present

## 2015-02-09 DIAGNOSIS — R278 Other lack of coordination: Secondary | ICD-10-CM | POA: Diagnosis not present

## 2015-02-10 DIAGNOSIS — R278 Other lack of coordination: Secondary | ICD-10-CM | POA: Diagnosis not present

## 2015-02-10 DIAGNOSIS — K219 Gastro-esophageal reflux disease without esophagitis: Secondary | ICD-10-CM | POA: Diagnosis not present

## 2015-02-10 DIAGNOSIS — R1312 Dysphagia, oropharyngeal phase: Secondary | ICD-10-CM | POA: Diagnosis not present

## 2015-02-10 DIAGNOSIS — R2681 Unsteadiness on feet: Secondary | ICD-10-CM | POA: Diagnosis not present

## 2015-02-10 DIAGNOSIS — I10 Essential (primary) hypertension: Secondary | ICD-10-CM | POA: Diagnosis not present

## 2015-02-10 DIAGNOSIS — G20 Parkinson's disease: Secondary | ICD-10-CM | POA: Diagnosis not present

## 2015-02-11 DIAGNOSIS — R2681 Unsteadiness on feet: Secondary | ICD-10-CM | POA: Diagnosis not present

## 2015-02-11 DIAGNOSIS — G20 Parkinson's disease: Secondary | ICD-10-CM | POA: Diagnosis not present

## 2015-02-11 DIAGNOSIS — I10 Essential (primary) hypertension: Secondary | ICD-10-CM | POA: Diagnosis not present

## 2015-02-11 DIAGNOSIS — R278 Other lack of coordination: Secondary | ICD-10-CM | POA: Diagnosis not present

## 2015-02-11 DIAGNOSIS — R1312 Dysphagia, oropharyngeal phase: Secondary | ICD-10-CM | POA: Diagnosis not present

## 2015-02-11 DIAGNOSIS — K219 Gastro-esophageal reflux disease without esophagitis: Secondary | ICD-10-CM | POA: Diagnosis not present

## 2015-02-12 DIAGNOSIS — K219 Gastro-esophageal reflux disease without esophagitis: Secondary | ICD-10-CM | POA: Diagnosis not present

## 2015-02-12 DIAGNOSIS — R1312 Dysphagia, oropharyngeal phase: Secondary | ICD-10-CM | POA: Diagnosis not present

## 2015-02-12 DIAGNOSIS — R278 Other lack of coordination: Secondary | ICD-10-CM | POA: Diagnosis not present

## 2015-02-12 DIAGNOSIS — I10 Essential (primary) hypertension: Secondary | ICD-10-CM | POA: Diagnosis not present

## 2015-02-12 DIAGNOSIS — R2681 Unsteadiness on feet: Secondary | ICD-10-CM | POA: Diagnosis not present

## 2015-02-12 DIAGNOSIS — G20 Parkinson's disease: Secondary | ICD-10-CM | POA: Diagnosis not present

## 2015-02-13 DIAGNOSIS — R278 Other lack of coordination: Secondary | ICD-10-CM | POA: Diagnosis not present

## 2015-02-13 DIAGNOSIS — R2681 Unsteadiness on feet: Secondary | ICD-10-CM | POA: Diagnosis not present

## 2015-02-13 DIAGNOSIS — I10 Essential (primary) hypertension: Secondary | ICD-10-CM | POA: Diagnosis not present

## 2015-02-13 DIAGNOSIS — R1312 Dysphagia, oropharyngeal phase: Secondary | ICD-10-CM | POA: Diagnosis not present

## 2015-02-13 DIAGNOSIS — K219 Gastro-esophageal reflux disease without esophagitis: Secondary | ICD-10-CM | POA: Diagnosis not present

## 2015-02-13 DIAGNOSIS — G20 Parkinson's disease: Secondary | ICD-10-CM | POA: Diagnosis not present

## 2015-02-16 DIAGNOSIS — G20 Parkinson's disease: Secondary | ICD-10-CM | POA: Diagnosis not present

## 2015-02-16 DIAGNOSIS — R1312 Dysphagia, oropharyngeal phase: Secondary | ICD-10-CM | POA: Diagnosis not present

## 2015-02-16 DIAGNOSIS — R2681 Unsteadiness on feet: Secondary | ICD-10-CM | POA: Diagnosis not present

## 2015-02-16 DIAGNOSIS — K219 Gastro-esophageal reflux disease without esophagitis: Secondary | ICD-10-CM | POA: Diagnosis not present

## 2015-02-16 DIAGNOSIS — R278 Other lack of coordination: Secondary | ICD-10-CM | POA: Diagnosis not present

## 2015-02-16 DIAGNOSIS — I10 Essential (primary) hypertension: Secondary | ICD-10-CM | POA: Diagnosis not present

## 2015-02-17 DIAGNOSIS — R2681 Unsteadiness on feet: Secondary | ICD-10-CM | POA: Diagnosis not present

## 2015-02-17 DIAGNOSIS — R278 Other lack of coordination: Secondary | ICD-10-CM | POA: Diagnosis not present

## 2015-02-17 DIAGNOSIS — R1312 Dysphagia, oropharyngeal phase: Secondary | ICD-10-CM | POA: Diagnosis not present

## 2015-02-17 DIAGNOSIS — I10 Essential (primary) hypertension: Secondary | ICD-10-CM | POA: Diagnosis not present

## 2015-02-17 DIAGNOSIS — G20 Parkinson's disease: Secondary | ICD-10-CM | POA: Diagnosis not present

## 2015-02-17 DIAGNOSIS — K219 Gastro-esophageal reflux disease without esophagitis: Secondary | ICD-10-CM | POA: Diagnosis not present

## 2015-02-19 DIAGNOSIS — N952 Postmenopausal atrophic vaginitis: Secondary | ICD-10-CM | POA: Diagnosis not present

## 2015-02-19 DIAGNOSIS — N3281 Overactive bladder: Secondary | ICD-10-CM | POA: Diagnosis not present

## 2015-02-19 DIAGNOSIS — R300 Dysuria: Secondary | ICD-10-CM | POA: Diagnosis not present

## 2015-02-19 DIAGNOSIS — E669 Obesity, unspecified: Secondary | ICD-10-CM | POA: Diagnosis not present

## 2015-03-03 ENCOUNTER — Other Ambulatory Visit: Payer: Self-pay

## 2015-03-03 DIAGNOSIS — N3281 Overactive bladder: Secondary | ICD-10-CM | POA: Insufficient documentation

## 2015-03-03 DIAGNOSIS — N200 Calculus of kidney: Secondary | ICD-10-CM

## 2015-03-24 ENCOUNTER — Other Ambulatory Visit: Payer: Self-pay | Admitting: Urology

## 2015-03-24 DIAGNOSIS — N200 Calculus of kidney: Secondary | ICD-10-CM

## 2015-03-25 ENCOUNTER — Ambulatory Visit
Admission: RE | Admit: 2015-03-25 | Discharge: 2015-03-25 | Disposition: A | Discharge status: Home / Self Care | Payer: Medicare Other | Source: Ambulatory Visit | Attending: Urology | Admitting: Urology

## 2015-03-25 DIAGNOSIS — Z87442 Personal history of urinary calculi: Secondary | ICD-10-CM | POA: Insufficient documentation

## 2015-03-25 DIAGNOSIS — R319 Hematuria, unspecified: Secondary | ICD-10-CM | POA: Diagnosis not present

## 2015-03-25 DIAGNOSIS — N200 Calculus of kidney: Secondary | ICD-10-CM

## 2015-03-25 DIAGNOSIS — Z9049 Acquired absence of other specified parts of digestive tract: Secondary | ICD-10-CM | POA: Diagnosis not present

## 2015-03-25 DIAGNOSIS — N390 Urinary tract infection, site not specified: Secondary | ICD-10-CM | POA: Diagnosis not present

## 2015-03-27 ENCOUNTER — Ambulatory Visit: Payer: Self-pay | Admitting: Urology

## 2015-03-27 DIAGNOSIS — R079 Chest pain, unspecified: Secondary | ICD-10-CM | POA: Diagnosis not present

## 2015-04-06 ENCOUNTER — Non-Acute Institutional Stay (SKILLED_NURSING_FACILITY): Payer: Medicare Other | Admitting: Adult Health

## 2015-04-06 ENCOUNTER — Encounter: Payer: Self-pay | Admitting: Adult Health

## 2015-04-06 DIAGNOSIS — K219 Gastro-esophageal reflux disease without esophagitis: Secondary | ICD-10-CM

## 2015-04-06 DIAGNOSIS — J309 Allergic rhinitis, unspecified: Secondary | ICD-10-CM

## 2015-04-06 DIAGNOSIS — G629 Polyneuropathy, unspecified: Secondary | ICD-10-CM | POA: Diagnosis not present

## 2015-04-06 DIAGNOSIS — K5900 Constipation, unspecified: Secondary | ICD-10-CM

## 2015-04-06 DIAGNOSIS — F3130 Bipolar disorder, current episode depressed, mild or moderate severity, unspecified: Secondary | ICD-10-CM

## 2015-04-06 DIAGNOSIS — N3281 Overactive bladder: Secondary | ICD-10-CM

## 2015-04-06 DIAGNOSIS — I10 Essential (primary) hypertension: Secondary | ICD-10-CM

## 2015-04-06 DIAGNOSIS — G20A1 Parkinson's disease without dyskinesia, without mention of fluctuations: Secondary | ICD-10-CM

## 2015-04-06 DIAGNOSIS — E1140 Type 2 diabetes mellitus with diabetic neuropathy, unspecified: Secondary | ICD-10-CM

## 2015-04-06 DIAGNOSIS — G20 Parkinson's disease: Secondary | ICD-10-CM

## 2015-04-06 DIAGNOSIS — Z79890 Hormone replacement therapy (postmenopausal): Secondary | ICD-10-CM

## 2015-04-06 DIAGNOSIS — F319 Bipolar disorder, unspecified: Secondary | ICD-10-CM

## 2015-04-16 ENCOUNTER — Encounter: Payer: Self-pay | Admitting: *Deleted

## 2015-04-17 ENCOUNTER — Encounter: Payer: Self-pay | Admitting: Urology

## 2015-04-17 ENCOUNTER — Ambulatory Visit (INDEPENDENT_AMBULATORY_CARE_PROVIDER_SITE_OTHER): Payer: Medicare Other | Admitting: Urology

## 2015-04-17 VITALS — BP 144/73 | HR 71 | Resp 18 | Ht 64.0 in

## 2015-04-17 DIAGNOSIS — R32 Unspecified urinary incontinence: Secondary | ICD-10-CM

## 2015-04-17 DIAGNOSIS — N390 Urinary tract infection, site not specified: Secondary | ICD-10-CM | POA: Insufficient documentation

## 2015-04-17 DIAGNOSIS — N952 Postmenopausal atrophic vaginitis: Secondary | ICD-10-CM | POA: Diagnosis not present

## 2015-04-17 NOTE — Progress Notes (Signed)
04/17/2015 11:11 AM   Maureen Taylor 1935/08/23 [id number]  Referring provider: No referring provider defined for this encounter.  Chief Complaint  Patient presents with  . Follow-up    KUB, RUS results  . Over Active Bladder    HPI: Maureen Taylor is a 80 year old white female who presents today for her RUS and KUB reports.   Patient is still experiencing significant leakage.  She is wearing "paper underwent" day and night.  She states she wets herself in the morning and wets her bed every night.  She is not experiencing any dysuria, hematuria or suprapubic pain.  I have reviewed her MAR and she is taking oxybutynin 5 mg once daily.  This is a sup therapeutic dose.    The imaging studies were performed to r/o stones due to her history of recurrent UTI's.  RUS did not demonstrate any GU pathology and KUB did not identify any stones.      I have reviewed the films with the patient.    She is not experiencing any fevers, chills, nausea or vomiting.    PMH: Past Medical History  Diagnosis Date  . Vitamin deficiency   . Rosacea   . Candidiasis   . Diabetes mellitus, type 2   . Magnesium deficiency   . History of falling   . Mixed incontinence   . Ascorbic acid deficiency   . Slow transit constipation   . Long term current use of insulin   . Pneumonia   . GERD (gastroesophageal reflux disease)   . Major depressive disorder   . Allergic rhinitis   . Charcot's joint   . Muscle weakness   . Obesity   . OAB (overactive bladder)   . Vaginal atrophy     Surgical History: Past Surgical History  Procedure Laterality Date  . Cholecystectomy    . Abdominal hysterectomy    . Revision total knee arthroplasty Right   . Cervical conization w/bx      Home Medications:    Medication List       This list is accurate as of: 04/17/15 11:11 AM.  Always use your most recent med list.               acidophilus Caps capsule  Take 1 capsule by mouth daily.     aspirin 81  MG EC tablet  Take 81 mg by mouth daily. Swallow whole.     carbidopa-levodopa 25-100 MG per tablet  Commonly known as:  SINEMET IR  Take 1 tablet by mouth 2 (two) times daily.     Cranberry 200 MG Caps  Take 2 capsules by mouth daily.     DULoxetine 60 MG capsule  Commonly known as:  CYMBALTA  Take 60 mg by mouth daily.     estrogens (conjugated) 0.625 MG tablet  Commonly known as:  PREMARIN  Take 0.625 mg by mouth daily. Take daily for 21 days then do not take for 7 days.     fluticasone 50 MCG/ACT nasal spray  Commonly known as:  FLONASE  Place 2 sprays into both nostrils daily.     gabapentin 100 MG capsule  Commonly known as:  NEURONTIN  Take by mouth 2 (two) times daily. Take 2 capsules twice a day     glimepiride 4 MG tablet  Commonly known as:  AMARYL  Take 4 mg by mouth daily with breakfast.     insulin glargine 100 UNIT/ML injection  Commonly known as:  LANTUS  Inject 26 Units into the skin at bedtime.     lamoTRIgine 100 MG tablet  Commonly known as:  LAMICTAL  Take 100 mg by mouth 2 (two) times daily.     loratadine 10 MG tablet  Commonly known as:  CLARITIN  Take 10 mg by mouth daily.     losartan 50 MG tablet  Commonly known as:  COZAAR  Take 50 mg by mouth daily.     magnesium oxide 400 MG tablet  Commonly known as:  MAG-OX  Take 400 mg by mouth daily.     metoprolol tartrate 25 MG tablet  Commonly known as:  LOPRESSOR  Take 25 mg by mouth 2 (two) times daily. Take 25 mg by mouth twice a day for HTN     oxybutynin 5 MG 24 hr tablet  Commonly known as:  DITROPAN-XL  Take 5 mg by mouth at bedtime.     pantoprazole 40 MG tablet  Commonly known as:  PROTONIX  Take 40 mg by mouth daily.     PROBIOTIC ACIDOPHILUS Tabs  Take by mouth. 1 capsule by mouth QD     THEREMS M PO  Take 1 tablet by mouth daily.        Allergies:  Allergies  Allergen Reactions  . Codeine   . Indocin [Indomethacin]   . Morphine And Related     Family  History: Family History  Problem Relation Age of Onset  . Kidney disease Neg Hx   . Bladder Cancer Neg Hx   . Prostate cancer Neg Hx     Social History:  reports that she has never smoked. She does not have any smokeless tobacco history on file. She reports that she does not drink alcohol. Her drug history is not on file.  ROS: UROLOGY Frequent Urination?: Yes Hard to postpone urination?: No Burning/pain with urination?: Yes Get up at night to urinate?: No Leakage of urine?: Yes Urine stream starts and stops?: No Trouble starting stream?: No Do you have to strain to urinate?: No Blood in urine?: No Urinary tract infection?: Yes Sexually transmitted disease?: No Injury to kidneys or bladder?: No Painful intercourse?: No Weak stream?: No Currently pregnant?: No Vaginal bleeding?: No Last menstrual period?: n  Gastrointestinal Nausea?: No Vomiting?: No Indigestion/heartburn?: No Diarrhea?: No Constipation?: No  Constitutional Fever: No Night sweats?: No Weight loss?: No Fatigue?: No  Skin Skin rash/lesions?: No Itching?: No  Eyes Blurred vision?: No Double vision?: No  Ears/Nose/Throat Sore throat?: No Sinus problems?: No  Hematologic/Lymphatic Swollen glands?: No Easy bruising?: No  Cardiovascular Leg swelling?: No Chest pain?: No  Respiratory Cough?: No Shortness of breath?: No  Endocrine Excessive thirst?: No  Musculoskeletal Back pain?: No Joint pain?: No  Neurological Headaches?: No Dizziness?: No  Psychologic Depression?: No Anxiety?: No  Physical Exam: BP 144/73 mmHg  Pulse 71  Resp 18  Ht  (1.626 m)   Laboratory Data: Lab Results  Component Value Date   WBC 13.5* 06/01/2014   HGB 12.8 06/01/2014   HCT 38.8 06/01/2014   MCV 92 06/01/2014   PLT 235 06/01/2014    Lab Results  Component Value Date   CREATININE 1.09 05/31/2014    No results found for: PSA  No results found for: TESTOSTERONE  No results  found for: HGBA1C  Urinalysis    Component Value Date/Time   COLORURINE Yellow 10/30/2014 0830   APPEARANCEUR Cloudy 10/30/2014 0830   LABSPEC 1.017 10/30/2014 0830   PHURINE 5.0  10/30/2014 0830   GLUCOSEU Negative 10/30/2014 0830   HGBUR Negative 10/30/2014 0830   BILIRUBINUR Negative 10/30/2014 0830   KETONESUR Negative 10/30/2014 0830   PROTEINUR Negative 10/30/2014 0830   NITRITE Positive 10/30/2014 0830   LEUKOCYTESUR 2+ 10/30/2014 0830    Pertinent Imaging: CLINICAL DATA: Kidney stones, UTI's  EXAM: RENAL / URINARY TRACT ULTRASOUND COMPLETE  COMPARISON: CT abdomen and pelvis 01/30/2012  FINDINGS: Right Kidney:  Length: 11.1 cm. Normal cortical thickness and echogenicity. No mass, hydronephrosis or shadowing calcification.  Left Kidney:  Length: 12.1 cm. Normal cortical thickness and echogenicity. No mass, hydronephrosis or shadowing calcification.  Bladder:  Normal appearance  Question mildly increased echogenicity of the liver which could suggest fatty infiltration.  IMPRESSION: No renal sonographic abnormalities identified.  Question fatty infiltration of liver.   Electronically Signed  By: Ulyses Southward M.D.  On: 03/25/2015 15:28  CLINICAL DATA: History kidney stones. Hematuria. Prior cholecystectomy.  EXAM: ABDOMEN - 1 VIEW  COMPARISON: 03/22/2013 CT  FINDINGS: 2 supine views of the abdomen and pelvis. Cholecystectomy clips. Large colonic stool burden. Right hip arthroplasty. No bowel distension. Distal gas and stool. No abnormal abdominal calcifications. No appendicolith. Probable phleboliths in the pelvis.  IMPRESSION: Possible constipation. No acute findings.   Electronically Signed  By: Jeronimo Greaves M.D.  On: 03/25/2015 16:22  Assessment & Plan:    1. History of recurrent UTI's:   Patient's imaging studies are negative for stones.  She does not have any symptoms of UTI at this time.  We will  continue to monitor.    2. Vaginal atrophy:   Patient is receiving the Premarin cream.  We will reexamine her mucosa when she returns in one month.    3. Incontinence:   We will increase the oxybutynin to 5mg  tid and have her return in one month for PVR and symptom recheck.    There are no diagnoses linked to this encounter.  No Follow-up on file.  Michiel Cowboy, PA-C  [facility] [address] [place], [place] [address] [phone]

## 2015-04-26 NOTE — Progress Notes (Signed)
Patient ID: Maureen Taylor, female   DOB: [birth year], 80 y.o.   MRN: [id number]   04/06/15  Facility:  Nursing Home Location:  [facility] Room Number: 905-2 LEVEL OF CARE:  SNF (31)   Chief Complaint  Patient presents with  . Medical Management of Chronic Issues    Bipolar depression, GERD, allergic rhinitis, hypertension, oral thrush, Parkinson's disease, neuropathy, OAB, HRT and diabetes mellitus    HISTORY OF PRESENT ILLNESS:  This is a 80 year old female who is being seen for a routine visit.  Her Lantus was recently decreased to 20 units SQ Q AM and HS. Myrbetriq was changed to Oxybutynin. She was started on Premarin for HRT  She has PMH of hypertension, depression, GERD, allergic rhinitis, diabetes mellitus, Parkinson's disease and neuropathy.  PAST MEDICAL HISTORY:   hypertension, depression, GERD, allergic rhinitis, diabetes mellitus, Parkinson's disease and neuropathy  CURRENT MEDICATIONS: Reviewed per MAR/see medication list  Allergies  Allergen Reactions  . Codeine   . Indocin [Indomethacin]   . Morphine And Related     REVIEW OF SYSTEMS:  GENERAL: no change in appetite, no fatigue, no weight changes, no fever, chills or weakness RESPIRATORY: no cough, SOB, DOE, wheezing, hemoptysis CARDIAC: no chest pain, edema or palpitations GI: no abdominal pain, diarrhea, constipation, heart burn, nausea or vomiting  PHYSICAL EXAMINATION  GENERAL: no acute distress, obese EYES: conjunctivae normal, sclerae normal, normal eye lids NECK: supple, trachea midline, no neck masses, no thyroid tenderness, no thyromegaly LYMPHATICS: no LAN in the neck, no supraclavicular LAN RESPIRATORY: breathing is even & unlabored, BS CTAB CARDIAC: RRR, no murmur,no extra heart sounds, no edema GI: abdomen soft, normal BS, no masses, no tenderness, no hepatomegaly, no splenomegaly, + abdominal hernia EXTREMITIES:  Able to move 4 extremities PSYCHIATRIC: the  patient is alert & oriented to person, affect & behavior appropriate  LABS/RADIOLOGY: Labs reviewed: 03/27/15  chest x-ray shows cardiomegaly and no definite acute pulmonary pathology 03/06/15  WBC 7.0 hemoglobin 13.1 hematocrit 38.5 MCV 91.9 platelet count 267 sodium 144 potassium 3.7 glucose 129 BUN 19 creatinine 0.65 total bilirubin 0.3 alkaline phosphatase 61 SGOT 16 SGPT 14 albumin 3.0 total protein 5.8 cholesterol 155 triglycerides 316 HDL 27 LDL 65 01/20/15  right knee x-ray shows no acute osseous abnormality 01/08/15  chest x-ray shows borderline cardiomegaly, no definite focal pneumonia 01/07/15  cholesterol 177 triglycerides 128 HDL 29 LDL 130 TSH 1.628 01/06/15  WBC 11.1 hemoglobin 14.8 hematocrit 43.0 MCV 87.9 sodium 138 potassium 4.8 glucose 181 BUN 17 creatinine 0.85 total bilirubin 0.6 alkaline phosphatase 108 SGOT 13 SGPT <8 total protein 6.7 albumin 3.5 calcium 9.1 hemoglobin A1c 9.7 01/03/15  right hip x-ray shows nicely aligned right hip bipolar prosthesis without evidence of acute osseous pathology 10/29/14  WBC 9.9 hemoglobin 13.8 hematocrit 42.0 MCV 92.9 10/28/14  sodium 138 potassium 4.2 glucose 259 BUN 18 creatinine 0.9 calcium 9.3 total protein 6.3 albumin 3.6 total bilirubin 0.4 ALP 101 AST 14 ALT 14 GFR >60 WBC 10.9 hemoglobin 13.6 hematocrit 41.4 MCV 93.9 10/24/14  magnesium 2.0  ASSESSMENT/PLAN:  Bipolar depression - mood is stable; continue Lamictal 100 mg by mouth twice a day GERD - continue Protonix 40 mg by mouth daily  Allergic rhinitis - continue Claritin 10 mg by mouth daily Hypertension - well-controlled; continue losartan 50 mg by mouth daily and metoprolol 25 mg by mouth twice a day Parkinson's disease - continue Sinemet 25/100 mg by mouth twice a day Neuropathy - continue  gabapentin. 200 mg by mouth twice a day and Cymbalta 60 mg by mouth daily Diabetes mellitus, type II - continue Lantus 20 units subcutaneous Q AM and at bedtime and Glimepiride 4 mg by mouth  daily OAB - continue oxybutynin 5 mg by mouth daily Constipation - continue senna S2 tabs by mouth daily at bedtime HRT - continue Premarin vaginal cream 0.625 mg insert half grams vaginally 2 times/week at bedtime    Goals of care:  long-term care   [facility], NP [facility] [phone]

## 2015-05-04 DIAGNOSIS — F329 Major depressive disorder, single episode, unspecified: Secondary | ICD-10-CM | POA: Diagnosis not present

## 2015-05-04 DIAGNOSIS — G4700 Insomnia, unspecified: Secondary | ICD-10-CM | POA: Diagnosis not present

## 2015-05-04 DIAGNOSIS — F39 Unspecified mood [affective] disorder: Secondary | ICD-10-CM | POA: Diagnosis not present

## 2015-05-05 DIAGNOSIS — N390 Urinary tract infection, site not specified: Secondary | ICD-10-CM | POA: Diagnosis not present

## 2015-05-05 DIAGNOSIS — F329 Major depressive disorder, single episode, unspecified: Secondary | ICD-10-CM | POA: Diagnosis not present

## 2015-05-11 ENCOUNTER — Encounter: Payer: Self-pay | Admitting: Adult Health

## 2015-05-11 ENCOUNTER — Non-Acute Institutional Stay (SKILLED_NURSING_FACILITY): Payer: Medicare Other | Admitting: Adult Health

## 2015-05-11 DIAGNOSIS — F319 Bipolar disorder, unspecified: Secondary | ICD-10-CM

## 2015-05-11 DIAGNOSIS — K5900 Constipation, unspecified: Secondary | ICD-10-CM | POA: Diagnosis not present

## 2015-05-11 DIAGNOSIS — Z79890 Hormone replacement therapy (postmenopausal): Secondary | ICD-10-CM | POA: Diagnosis not present

## 2015-05-11 DIAGNOSIS — K219 Gastro-esophageal reflux disease without esophagitis: Secondary | ICD-10-CM

## 2015-05-11 DIAGNOSIS — J309 Allergic rhinitis, unspecified: Secondary | ICD-10-CM | POA: Diagnosis not present

## 2015-05-11 DIAGNOSIS — N3281 Overactive bladder: Secondary | ICD-10-CM

## 2015-05-11 DIAGNOSIS — I10 Essential (primary) hypertension: Secondary | ICD-10-CM

## 2015-05-11 DIAGNOSIS — G629 Polyneuropathy, unspecified: Secondary | ICD-10-CM | POA: Diagnosis not present

## 2015-05-11 DIAGNOSIS — N390 Urinary tract infection, site not specified: Secondary | ICD-10-CM

## 2015-05-11 DIAGNOSIS — G20 Parkinson's disease: Secondary | ICD-10-CM

## 2015-05-11 DIAGNOSIS — E1140 Type 2 diabetes mellitus with diabetic neuropathy, unspecified: Secondary | ICD-10-CM

## 2015-05-11 DIAGNOSIS — F3130 Bipolar disorder, current episode depressed, mild or moderate severity, unspecified: Secondary | ICD-10-CM | POA: Diagnosis not present

## 2015-05-11 DIAGNOSIS — R682 Dry mouth, unspecified: Secondary | ICD-10-CM | POA: Diagnosis not present

## 2015-05-11 DIAGNOSIS — G20A1 Parkinson's disease without dyskinesia, without mention of fluctuations: Secondary | ICD-10-CM

## 2015-05-11 DIAGNOSIS — E559 Vitamin D deficiency, unspecified: Secondary | ICD-10-CM

## 2015-05-11 DIAGNOSIS — E46 Unspecified protein-calorie malnutrition: Secondary | ICD-10-CM

## 2015-05-11 NOTE — Progress Notes (Signed)
Patient ID: Maureen Taylor, female   DOB: August 13, 1935, 80 y.o.   MRN: [id number]   05/11/15  Facility:  Nursing Home Location:  [place] Room Number: 989-2 LEVEL OF CARE:  SNF (31)   Chief Complaint  Patient presents with  . Medical Management of Chronic Issues    UTI, Vitamin D deficiency, dry mouth, protein-calorie malnutrition, bipolar depression, GERD, allergic rhinitis, hypertension, constipation, Parkinson's disease, neuropathy, OAB, HRT and diabetes mellitus    HISTORY OF PRESENT ILLNESS:  This is a 80 year old female who is being seen for a routine visit. She was recently started on Macrobid for UTI. Urine culture was positive for nitrites. She was recently started on Vitamin D supplementation due to deficiency and started on biotene for dry mouth. She has PMH of hypertension, depression, GERD, allergic rhinitis, diabetes mellitus, Parkinson's disease and neuropathy.  PAST MEDICAL HISTORY:   hypertension, depression, GERD, allergic rhinitis, diabetes mellitus, Parkinson's disease and neuropathy  CURRENT MEDICATIONS: Reviewed per MAR/see medication list    Medication List       This list is accurate as of: 05/11/15  9:10 PM.  Always use your most recent med list.               acidophilus Caps capsule  Take 1 capsule by mouth daily.     antiseptic oral rinse Liqd  10 mLs by Mouth Rinse route daily.     aspirin 81 MG EC tablet  Take 81 mg by mouth daily. Swallow whole.     carbidopa-levodopa 25-100 MG per tablet  Commonly known as:  SINEMET IR  Take 1 tablet by mouth 2 (two) times daily.     Cranberry 200 MG Caps  Take 2 capsules by mouth daily.     DULoxetine 60 MG capsule  Commonly known as:  CYMBALTA  Take 60 mg by mouth daily.     estrogens (conjugated) 0.625 MG tablet  Commonly known as:  PREMARIN  Take 0.625 mg by mouth daily. Take daily for 21 days then do not take for 7 days.     fluticasone 50 MCG/ACT nasal spray    Commonly known as:  FLONASE  Place 2 sprays into both nostrils daily.     gabapentin 100 MG capsule  Commonly known as:  NEURONTIN  Take by mouth 2 (two) times daily. Take 2 capsules twice a day     glimepiride 4 MG tablet  Commonly known as:  AMARYL  Take 4 mg by mouth daily with breakfast.     insulin glargine 100 UNIT/ML injection  Commonly known as:  LANTUS  Inject 26 Units into the skin at bedtime.     lamoTRIgine 100 MG tablet  Commonly known as:  LAMICTAL  Take 100 mg by mouth 2 (two) times daily.     loratadine 10 MG tablet  Commonly known as:  CLARITIN  Take 10 mg by mouth daily.     losartan 50 MG tablet  Commonly known as:  COZAAR  Take 50 mg by mouth daily.     magnesium oxide 400 MG tablet  Commonly known as:  MAG-OX  Take 400 mg by mouth daily.     metoprolol tartrate 25 MG tablet  Commonly known as:  LOPRESSOR  Take 25 mg by mouth 2 (two) times daily. Take 25 mg by mouth twice a day for HTN     oxybutynin 5 MG tablet  Commonly known as:  DITROPAN  Take 5 mg  by mouth 3 (three) times daily.     pantoprazole 40 MG tablet  Commonly known as:  PROTONIX  Take 40 mg by mouth daily.     PROBIOTIC ACIDOPHILUS Tabs  Take by mouth. 1 capsule by mouth QD     THEREMS M PO  Take 1 tablet by mouth daily.          Allergies  Allergen Reactions  . Codeine   . Indocin [Indomethacin]   . Morphine And Related     REVIEW OF SYSTEMS:  GENERAL: no change in appetite, no fatigue, no weight changes, no fever, chills or weakness RESPIRATORY: no cough, SOB, DOE, wheezing, hemoptysis CARDIAC: no chest pain, edema or palpitations GI: no abdominal pain, diarrhea, constipation, heart burn, nausea or vomiting  PHYSICAL EXAMINATION  GENERAL: no acute distress, obese EYES: conjunctivae normal, sclerae normal, normal eye lids NECK: supple, trachea midline, no neck masses, no thyroid tenderness, no thyromegaly LYMPHATICS: no LAN in the neck, no supraclavicular  LAN RESPIRATORY: breathing is even & unlabored, BS CTAB CARDIAC: RRR, no murmur,no extra heart sounds, no edema GI: abdomen soft, normal BS, no masses, no tenderness, no hepatomegaly, no splenomegaly, + abdominal hernia EXTREMITIES:  Able to move 4 extremities PSYCHIATRIC: the patient is alert & oriented to person, affect & behavior appropriate  LABS/RADIOLOGY: Labs reviewed: 05/05/15  Urinalysis + for nitrites and many bacteria, wbc 11.3  hgb 13.6  hct 39.9  Platelet 310  NA 142  K 4.0  Glucose 179  BUN 18  Creatinine 0.72  Total bil 0.7  Alk phos 95  SGOT 16  SGPT 16  Total protein 6.2  Albumin 3.2  CA 8.9 vitamin D 25 03/27/15  chest x-ray shows cardiomegaly and no definite acute pulmonary pathology 03/06/15  WBC 7.0 hemoglobin 13.1 hematocrit 38.5 MCV 91.9 platelet count 267 sodium 144 potassium 3.7 glucose 129 BUN 19 creatinine 0.65 total bilirubin 0.3 alkaline phosphatase 61 SGOT 16 SGPT 14 albumin 3.0 total protein 5.8 cholesterol 155 triglycerides 316 HDL 27 LDL 65 01/20/15  right knee x-ray shows no acute osseous abnormality 01/08/15  chest x-ray shows borderline cardiomegaly, no definite focal pneumonia 01/07/15  cholesterol 177 triglycerides 128 HDL 29 LDL 130 TSH 1.628 01/06/15  WBC 11.1 hemoglobin 14.8 hematocrit 43.0 MCV 87.9 sodium 138 potassium 4.8 glucose 181 BUN 17 creatinine 0.85 total bilirubin 0.6 alkaline phosphatase 108 SGOT 13 SGPT <8 total protein 6.7 albumin 3.5 calcium 9.1 hemoglobin A1c 9.7 01/03/15  right hip x-ray shows nicely aligned right hip bipolar prosthesis without evidence of acute osseous pathology 10/29/14  WBC 9.9 hemoglobin 13.8 hematocrit 42.0 MCV 92.9 10/28/14  sodium 138 potassium 4.2 glucose 259 BUN 18 creatinine 0.9 calcium 9.3 total protein 6.3 albumin 3.6 total bilirubin 0.4 ALP 101 AST 14 ALT 14 GFR >60 WBC 10.9 hemoglobin 13.6 hematocrit 41.4 MCV 93.9 10/24/14  magnesium 2.0  ASSESSMENT/PLAN:  UTI - continue Macrobid 100 mg BID X 4 more days Vitamin D  deficiency - continue Vitamin D 1000 units daily Dry mouth - continue Biotene liquid 10 ml swish and spit daily Protein-calorie malnutrition - albumin 3.2; continue supplementation Bipolar depression - mood is stable; continue Lamictal 100 mg by mouth twice a day GERD - continue Protonix 40 mg by mouth daily  Allergic rhinitis - continue Claritin 10 mg by mouth daily Hypertension - well-controlled; continue losartan 50 mg by mouth daily and metoprolol 25 mg by mouth twice a day Parkinson's disease - continue Sinemet 25/100 mg by mouth twice a  day Neuropathy - continue gabapentin. 200 mg by mouth twice a day and Cymbalta 60 mg by mouth daily Diabetes mellitus, type II - continue Lantus 20 units subcutaneous Q AM and at bedtime and Glimepiride 4 mg by mouth daily OAB - recently increased  oxybutynin 5 mg by mouth TID Constipation - continue senna S2 tabs by mouth daily at bedtime HRT - continue Premarin vaginal cream 0.625 mg insert half grams vaginally 2 times/week at bedtime    Goals of care:  long-term care   [facility], [facility] [phone]

## 2015-05-12 DIAGNOSIS — E1165 Type 2 diabetes mellitus with hyperglycemia: Secondary | ICD-10-CM | POA: Diagnosis not present

## 2015-05-19 ENCOUNTER — Encounter: Payer: Self-pay | Admitting: Urology

## 2015-05-19 ENCOUNTER — Ambulatory Visit (INDEPENDENT_AMBULATORY_CARE_PROVIDER_SITE_OTHER): Payer: Medicare Other | Admitting: Urology

## 2015-05-19 VITALS — BP 134/69 | HR 89 | Ht 64.0 in | Wt 242.5 lb

## 2015-05-19 DIAGNOSIS — R32 Unspecified urinary incontinence: Secondary | ICD-10-CM | POA: Diagnosis not present

## 2015-05-19 DIAGNOSIS — N390 Urinary tract infection, site not specified: Secondary | ICD-10-CM

## 2015-05-19 LAB — BLADDER SCAN AMB NON-IMAGING

## 2015-05-19 NOTE — Progress Notes (Signed)
11:43 AM   Maureen Taylor 1935-06-25 [id number]  Referring provider: No referring provider defined for this encounter.  Chief Complaint  Patient presents with  . Recurrent UTI    36month recheck    HPI: Maureen Taylor is a 80 year old white female who has a history of recurrent UTI's, atrophic vaginitis and incontinence who presents today for follow up.    Recurrent UTI  Patient has had a positive urine for Klebsiella 05/27/2014, a positive urine culture for Escherichia coli and strep viridians on 10/30/2014, a positive urine culture for Citrobacter freudii on 02/19/2015 and most recently Escherichia coli on 05/05/2015.  Her symptoms at the time of the infections are dysuria and increase in incontinence.  Imaging studies performed in June 2016 (RUS and KUB) does not identify any nephrolithiasis or hydronephrosis.  Atrophic vaginitis Patient was initiated on vaginal estrogen cream back in April 2016.  She states she has noticed an improvement in her vaginal dryness and burning.  According to her [facility], she is receiving half a gram of the cream on Tuesdays and Saturdays nights.  Incontinence: Patient had her oxybutynin 5 mg increased to 3 times a day one month ago and she returns today to check her PVR. Patient has noticed a decrease in her incontinence episodes. She states that she is only having to incontinent episodes daily   She is not experiencing any fevers, chills, nausea or vomiting.  She also denies any suprapubic pain or gross hematuria.  PMH: Past Medical History  Diagnosis Date  . Vitamin deficiency   . Rosacea   . Candidiasis   . Diabetes mellitus, type 2   . Magnesium deficiency   . History of falling   . Mixed incontinence   . Ascorbic acid deficiency   . Slow transit constipation   . Long term current use of insulin   . Pneumonia   . GERD (gastroesophageal reflux disease)   . Major depressive disorder   . Allergic rhinitis   . Charcot's joint   . Muscle  weakness   . Obesity   . OAB (overactive bladder)   . Vaginal atrophy     Surgical History: Past Surgical History  Procedure Laterality Date  . Cholecystectomy    . Abdominal hysterectomy    . Revision total knee arthroplasty Right   . Cervical conization w/bx      Home Medications:    Medication List       This list is accurate as of: 05/19/15 11:43 AM.  Always use your most recent med list.               antiseptic oral rinse Liqd  10 mLs by Mouth Rinse route daily.     aspirin 81 MG EC tablet  Take 81 mg by mouth daily. Swallow whole.     carbidopa-levodopa 25-100 MG per tablet  Commonly known as:  SINEMET IR  Take 1 tablet by mouth 2 (two) times daily.     Cranberry 200 MG Caps  Take 2 capsules by mouth daily.     DULoxetine 60 MG capsule  Commonly known as:  CYMBALTA  Take 60 mg by mouth daily.     estrogens (conjugated) 0.625 MG tablet  Commonly known as:  PREMARIN  Take 0.625 mg by mouth daily. Take daily for 21 days then do not take for 7 days.     gabapentin 100 MG capsule  Commonly known as:  NEURONTIN  Take by mouth 2 (two)  times daily. Take 2 capsules twice a day     glimepiride 4 MG tablet  Commonly known as:  AMARYL  Take 4 mg by mouth daily with breakfast.     insulin glargine 100 UNIT/ML injection  Commonly known as:  LANTUS  Inject 26 Units into the skin at bedtime.     lamoTRIgine 100 MG tablet  Commonly known as:  LAMICTAL  Take 100 mg by mouth 2 (two) times daily.     loratadine 10 MG tablet  Commonly known as:  CLARITIN  Take 10 mg by mouth daily.     losartan 50 MG tablet  Commonly known as:  COZAAR  Take 50 mg by mouth daily.     magnesium oxide 400 MG tablet  Commonly known as:  MAG-OX  Take 400 mg by mouth daily.     metoprolol tartrate 25 MG tablet  Commonly known as:  LOPRESSOR  Take 25 mg by mouth 2 (two) times daily. Take 25 mg by mouth twice a day for HTN     oxybutynin 5 MG tablet  Commonly known as:   DITROPAN  Take 5 mg by mouth 3 (three) times daily.     pantoprazole 40 MG tablet  Commonly known as:  PROTONIX  Take 40 mg by mouth daily.     PROBIOTIC ACIDOPHILUS Tabs  Take by mouth. 1 capsule by mouth QD     senna 8.6 MG tablet  Commonly known as:  SENOKOT  Take 1 tablet by mouth daily.     THEREMS M PO  Take 1 tablet by mouth daily.        Allergies:  Allergies  Allergen Reactions  . Codeine   . Indocin [Indomethacin]   . Morphine And Related     Family History: Family History  Problem Relation Age of Onset  . Kidney disease Neg Hx   . Bladder Cancer Neg Hx   . Prostate cancer Neg Hx     Social History:  reports that she has never smoked. She does not have any smokeless tobacco history on file. She reports that she does not drink alcohol. Her drug history is not on file.  ROS: UROLOGY Frequent Urination?: Yes Hard to postpone urination?: No Burning/pain with urination?: Yes Get up at night to urinate?: No Leakage of urine?: Yes Urine stream starts and stops?: No Trouble starting stream?: No Do you have to strain to urinate?: No Blood in urine?: No Urinary tract infection?: No Sexually transmitted disease?: No Injury to kidneys or bladder?: No Painful intercourse?: No Weak stream?: No Currently pregnant?: No Vaginal bleeding?: No Last menstrual period?: n  Gastrointestinal Nausea?: No Vomiting?: No Indigestion/heartburn?: Yes Diarrhea?: Yes Constipation?: No  Constitutional Fever: No Night sweats?: No Weight loss?: No Fatigue?: No  Skin Skin rash/lesions?: No Itching?: Yes  Eyes Blurred vision?: No Double vision?: No  Ears/Nose/Throat Sore throat?: No Sinus problems?: No  Hematologic/Lymphatic Swollen glands?: No Easy bruising?: No  Cardiovascular Leg swelling?: No Chest pain?: No  Respiratory Cough?: No Shortness of breath?: Yes  Endocrine Excessive thirst?: No  Musculoskeletal Back pain?: Yes Joint pain?:  No  Neurological Headaches?: No Dizziness?: No  Psychologic Depression?: No Anxiety?: No  Physical Exam: BP 134/69 mmHg  Pulse 89  Ht  (1.626 m)  Wt 242 lb 8 oz (109.997 kg)  BMI 41.60 kg/m2  GU:  Atrophic external genitalia.  Normal urethral meatus. No urethral masses and/or tenderness. No bladder fullness or masses. No vaginal lesions or  discharge. Normal rectal tone, no masses. Normal anus and perineum.    Laboratory Data: Lab Results  Component Value Date   WBC 13.5* 06/01/2014   HGB 12.8 06/01/2014   HCT 38.8 06/01/2014   MCV 92 06/01/2014   PLT 235 06/01/2014    Lab Results  Component Value Date   CREATININE 1.09 05/31/2014     Lab Results  Component Value Date   HGBA1C 9.0* 03/22/2013    Urinalysis    Component Value Date/Time   COLORURINE Yellow 10/30/2014 0830   APPEARANCEUR Cloudy 10/30/2014 0830   LABSPEC 1.017 10/30/2014 0830   PHURINE 5.0 10/30/2014 0830   GLUCOSEU Negative 10/30/2014 0830   HGBUR Negative 10/30/2014 0830   BILIRUBINUR Negative 10/30/2014 0830   KETONESUR Negative 10/30/2014 0830   PROTEINUR Negative 10/30/2014 0830   NITRITE Positive 10/30/2014 0830   LEUKOCYTESUR 2+ 10/30/2014 0830    Pertinent Imaging: Results for orders placed or performed in visit on 05/19/15  BLADDER SCAN AMB NON-IMAGING  Result Value Ref Range   Scan Result 89ml    Assessment & Plan:    1. History of recurrent UTI's:   Patient's imaging studies are negative for stones.  She does not have any symptoms of UTI at this time.  She did recently have a UTI on August 8th, 2016 and it was treated with Macrobid. We will continue to monitor. She will return in 3 months time.  I encourage to only check a urinalysis if patient is complaining of urinary tract infection symptoms, such as: Dysuria, hematuria, suprapubic pain, fevers or chills, lower back pain or mental status changes.   If it is possible, a catheter specimen would be the best option if patient  is experiencing UTI symptoms.  2. Vaginal atrophy:   Patient is receiving the Premarin cream, 1/2 gram Tuesday and Saturday nights.   We will reexamine the vaginal mucosa in 3 months.  3. Incontinence:   Patient states that she is responding well to the oxybutynin 5 mg 3 times a day. She is not complaining of dry mouth, but she does have chronic constipation. This was prior to taking the oxybutynin. Unfortunately, other anti-cholinergics and Myrbetriq are cost prohibitive for the patient.  Her PVR today's visit is 89 mL. We will continue to monitor her symptoms and PVR. She will return in 3 months time.   Nursing note for RTC:      -EXAM      - PVR     Michiel Cowboy, [facility] [address] [place], [place] [address] [phone]

## 2015-05-20 DIAGNOSIS — K219 Gastro-esophageal reflux disease without esophagitis: Secondary | ICD-10-CM | POA: Diagnosis not present

## 2015-05-20 DIAGNOSIS — L719 Rosacea, unspecified: Secondary | ICD-10-CM | POA: Diagnosis not present

## 2015-05-20 DIAGNOSIS — G20 Parkinson's disease: Secondary | ICD-10-CM | POA: Diagnosis not present

## 2015-05-20 DIAGNOSIS — Z794 Long term (current) use of insulin: Secondary | ICD-10-CM | POA: Diagnosis not present

## 2015-05-20 DIAGNOSIS — I10 Essential (primary) hypertension: Secondary | ICD-10-CM | POA: Diagnosis not present

## 2015-05-20 DIAGNOSIS — E119 Type 2 diabetes mellitus without complications: Secondary | ICD-10-CM | POA: Diagnosis not present

## 2015-05-20 DIAGNOSIS — N390 Urinary tract infection, site not specified: Secondary | ICD-10-CM | POA: Diagnosis not present

## 2015-05-20 DIAGNOSIS — N3281 Overactive bladder: Secondary | ICD-10-CM | POA: Diagnosis not present

## 2015-05-20 DIAGNOSIS — E1165 Type 2 diabetes mellitus with hyperglycemia: Secondary | ICD-10-CM | POA: Diagnosis not present

## 2015-05-20 DIAGNOSIS — F329 Major depressive disorder, single episode, unspecified: Secondary | ICD-10-CM | POA: Diagnosis not present

## 2015-05-20 DIAGNOSIS — M1460 Charcot's joint, unspecified site: Secondary | ICD-10-CM | POA: Diagnosis not present

## 2015-05-22 DIAGNOSIS — F329 Major depressive disorder, single episode, unspecified: Secondary | ICD-10-CM | POA: Diagnosis not present

## 2015-05-22 DIAGNOSIS — F39 Unspecified mood [affective] disorder: Secondary | ICD-10-CM | POA: Diagnosis not present

## 2015-05-22 DIAGNOSIS — D72829 Elevated white blood cell count, unspecified: Secondary | ICD-10-CM | POA: Diagnosis not present

## 2015-05-22 DIAGNOSIS — G4700 Insomnia, unspecified: Secondary | ICD-10-CM | POA: Diagnosis not present

## 2015-05-23 DIAGNOSIS — D72829 Elevated white blood cell count, unspecified: Secondary | ICD-10-CM | POA: Diagnosis not present

## 2015-06-03 DIAGNOSIS — N3281 Overactive bladder: Secondary | ICD-10-CM | POA: Diagnosis not present

## 2015-06-03 DIAGNOSIS — G20 Parkinson's disease: Secondary | ICD-10-CM | POA: Diagnosis not present

## 2015-06-03 DIAGNOSIS — M1460 Charcot's joint, unspecified site: Secondary | ICD-10-CM | POA: Diagnosis not present

## 2015-06-03 DIAGNOSIS — F329 Major depressive disorder, single episode, unspecified: Secondary | ICD-10-CM | POA: Diagnosis not present

## 2015-06-03 DIAGNOSIS — E119 Type 2 diabetes mellitus without complications: Secondary | ICD-10-CM | POA: Diagnosis not present

## 2015-06-03 DIAGNOSIS — Z9181 History of falling: Secondary | ICD-10-CM | POA: Diagnosis not present

## 2015-06-03 DIAGNOSIS — R2681 Unsteadiness on feet: Secondary | ICD-10-CM | POA: Diagnosis not present

## 2015-06-03 DIAGNOSIS — M6281 Muscle weakness (generalized): Secondary | ICD-10-CM | POA: Diagnosis not present

## 2015-06-03 DIAGNOSIS — G629 Polyneuropathy, unspecified: Secondary | ICD-10-CM | POA: Diagnosis not present

## 2015-06-03 LAB — TSH: TSH: 2.73 u[IU]/mL (ref 0.41–5.90)

## 2015-06-05 ENCOUNTER — Non-Acute Institutional Stay (SKILLED_NURSING_FACILITY): Payer: Medicare Other | Admitting: Internal Medicine

## 2015-06-05 DIAGNOSIS — F3130 Bipolar disorder, current episode depressed, mild or moderate severity, unspecified: Secondary | ICD-10-CM | POA: Diagnosis not present

## 2015-06-05 DIAGNOSIS — E785 Hyperlipidemia, unspecified: Secondary | ICD-10-CM | POA: Diagnosis not present

## 2015-06-05 DIAGNOSIS — M6281 Muscle weakness (generalized): Secondary | ICD-10-CM | POA: Diagnosis not present

## 2015-06-05 DIAGNOSIS — M1460 Charcot's joint, unspecified site: Secondary | ICD-10-CM | POA: Diagnosis not present

## 2015-06-05 DIAGNOSIS — E559 Vitamin D deficiency, unspecified: Secondary | ICD-10-CM

## 2015-06-05 DIAGNOSIS — D72829 Elevated white blood cell count, unspecified: Secondary | ICD-10-CM | POA: Diagnosis not present

## 2015-06-05 DIAGNOSIS — Z9181 History of falling: Secondary | ICD-10-CM | POA: Diagnosis not present

## 2015-06-05 DIAGNOSIS — G629 Polyneuropathy, unspecified: Secondary | ICD-10-CM | POA: Diagnosis not present

## 2015-06-05 DIAGNOSIS — K219 Gastro-esophageal reflux disease without esophagitis: Secondary | ICD-10-CM | POA: Diagnosis not present

## 2015-06-05 DIAGNOSIS — G20 Parkinson's disease: Secondary | ICD-10-CM

## 2015-06-05 DIAGNOSIS — R195 Other fecal abnormalities: Secondary | ICD-10-CM | POA: Diagnosis not present

## 2015-06-05 DIAGNOSIS — R300 Dysuria: Secondary | ICD-10-CM

## 2015-06-05 DIAGNOSIS — I10 Essential (primary) hypertension: Secondary | ICD-10-CM

## 2015-06-05 DIAGNOSIS — E1142 Type 2 diabetes mellitus with diabetic polyneuropathy: Secondary | ICD-10-CM

## 2015-06-05 DIAGNOSIS — R2681 Unsteadiness on feet: Secondary | ICD-10-CM | POA: Diagnosis not present

## 2015-06-05 DIAGNOSIS — G20A1 Parkinson's disease without dyskinesia, without mention of fluctuations: Secondary | ICD-10-CM

## 2015-06-05 NOTE — Progress Notes (Signed)
Patient ID: Maureen Taylor, female   DOB: Jan 11, 1935, 80 y.o.   MRN: [id number]     [facility]   PCP: No PCP Per Patient  Code Status: full code  Allergies  Allergen Reactions  . Codeine   . Indocin [Indomethacin]   . Morphine And Related     Chief Complaint  Patient presents with  . Medical Management of Chronic Issues     HPI:  80 year old patient is seen for routine visit. She is here for long term care and has PMH of hypertension, depression, GERD, allergic rhinitis, diabetes mellitus, Parkinson's disease and neuropathy. She is seen in her room. She is in no distress. She complaints of dysuria for few days. She is prone to getting UTI frequently. She feels low. has easy fatigue and feels low with energy. Occasional loose stools reported, none today. No other complaints. No new concern from nursing staff.    Review of Systems:  Constitutional: positive for easy fatigue. Negative for fever, chills, diaphoresis.  HENT: Negative for headache, congestion, nasal discharge. Allergies controlled Eyes: Negative for eye pain, blurred vision, double vision and discharge.  Respiratory: Negative for cough, shortness of breath and wheezing.   Cardiovascular: Negative for chest pain, palpitations, leg swelling.  Gastrointestinal: Negative for nausea, vomiting, abdominal pain. Heartburn symptom controlled Musculoskeletal: Negative for back pain, recent falls Skin: Negative for itching, rash.  Neurological: Negative for dizziness, focal weakness Psychiatric/Behavioral: positive for feeling low, losing interest in activities, mostly in bed  Past medical history reviewed.   Medications: Patient's Medications  New Prescriptions   No medications on file  Previous Medications   ANTISEPTIC ORAL RINSE (BIOTENE) LIQD    10 mLs by Mouth Rinse route daily.   ASPIRIN 81 MG EC TABLET    Take 81 mg by mouth daily. Swallow whole.   CARBIDOPA-LEVODOPA (SINEMET  IR) 25-100 MG PER TABLET    Take 1 tablet by mouth 2 (two) times daily.   CRANBERRY 200 MG CAPS    Take 2 capsules by mouth daily.   DULOXETINE (CYMBALTA) 60 MG CAPSULE    Take 60 mg by mouth daily.   ESTROGENS, CONJUGATED, (PREMARIN) 0.625 MG TABLET    Take 0.625 mg by mouth daily. Take daily for 21 days then do not take for 7 days.   GABAPENTIN (NEURONTIN) 100 MG CAPSULE    Take by mouth 2 (two) times daily. Take 2 capsules twice a day   GLIMEPIRIDE (AMARYL) 4 MG TABLET    Take 4 mg by mouth daily with breakfast.   INSULIN GLARGINE (LANTUS) 100 UNIT/ML INJECTION    Inject 26 Units into the skin at bedtime.   LACTOBACILLUS (PROBIOTIC ACIDOPHILUS) TABS    Take by mouth. 1 capsule by mouth QD   LAMOTRIGINE (LAMICTAL) 100 MG TABLET    Take 100 mg by mouth 2 (two) times daily.   LORATADINE (CLARITIN) 10 MG TABLET    Take 10 mg by mouth daily.   LOSARTAN (COZAAR) 50 MG TABLET    Take 50 mg by mouth daily.   MAGNESIUM OXIDE (MAG-OX) 400 MG TABLET    Take 400 mg by mouth daily.   METOPROLOL TARTRATE (LOPRESSOR) 25 MG TABLET    Take 25 mg by mouth 2 (two) times daily. Take 25 mg by mouth twice a day for HTN   MULTIPLE VITAMINS-MINERALS (THEREMS M PO)    Take 1 tablet by mouth daily.   OXYBUTYNIN (DITROPAN) 5 MG TABLET  Take 5 mg by mouth 3 (three) times daily.   PANTOPRAZOLE (PROTONIX) 40 MG TABLET    Take 40 mg by mouth daily.   SENNA (SENOKOT) 8.6 MG TABLET    Take 1 tablet by mouth daily.  Modified Medications   No medications on file  Discontinued Medications   No medications on file     Physical Exam: Filed Vitals:   06/05/15 1621  BP: 130/65  Pulse: 88  Temp: 97.4 F (36.3 C)  Resp: 16  Height:  (1.676 m)  Weight: 242 lb 9.6 oz (110.043 kg)  SpO2: 91%   Wt Readings from Last 3 Encounters:  06/05/15 242 lb 9.6 oz (110.043 kg)  05/19/15 242 lb 8 oz (109.997 kg)  05/11/15 247 lb (112.038 kg)   General- 80 elderly female, obese,  in no acute distress Head- normocephalic,  atraumatic Throat- moist mucus membrane Eyes- PERRLA, EOMI, no pallor, no icterus, no discharge, normal conjunctiva, normal sclera Neck- no cervical lymphadenopathy, no supraclavicular lymphadenopathy, no thyromegaly, no jugular vein distension Cardiovascular- normal s1,s2, no murmurs, palpable dorsalis pedis, trace leg edema Respiratory- bilateral clear to auscultation, no wheeze, no rhonchi, no crackles, no use of accessory muscles Abdomen- bowel sounds present, soft, non tender Musculoskeletal- able to move all 4 extremities, limited normal range of motion  Neurological- no focal deficit, alert and oriented Skin- warm and dry Psychiatry- flat affect    Labs reviewed: 05/20/15 wbc 16.4, hb 15.4, hct 44.9, plt 333 05/12/15 wbc 11.3, hb 14.1, plt 344, a1c 8.3 05/05/15 wbc 11.3, na 142, k 4, bun 18, cr 0.72, glu 179, alb 3.2, vit d 25 03/06/15 t.chol 155, ldl 65, hdl 27, tg 316 10/29/14  WBC 9.9 hemoglobin 13.8 hematocrit 42.0 MCV 92.9 10/28/14  sodium 138 potassium 4.2 glucose 259 BUN 18 creatinine 0.9 calcium 9.3 total protein 6.3 albumin 3.6 total bilirubin 0.4 ALP 101 AST 14 ALT 14 GFR >60 WBC 10.9 hemoglobin 13.6 hematocrit 41.4 MCV 93.9 10/24/14  magnesium 2.0   Assessment/Plan  Dysuria Prone to recurrent uti, had e.coli uti last time. Check u/a with c/s. continue cranberry supplement for now and hydration encouraged  Dm type 2 with neuropathy a1c 8.3. Currently on Lantus 26 units daily and Glimepiride 4 mg daily. Monitor cbg. Continue baby aspirin and start statin as below. Check urine microalbumin. Currently on losartan. Neuropathic pain controlled. Continue gabapentin with cymbalata current regimen, no changes made.  Hyperlipidemia Elevated TG. Start lipitor 10 mg po daily and monitor. Check lipid panel in 3 months with LFT  Vitamin d deficiency Low vitamin d level on review. Start vitamin d 50,000 iu once a week for 12 weeks and reassess  Leukocytosis Noted on last lab, check  cbc with diff  gerd Stable symptom. Continue protonix 40 mg daily for now  Bipolar disorder continue Lamictal 100 mg bid, duloxetine 60 mg daily and get psych consult. Encouraged group activity participation and to be out of bed  Parkinson's disease Stable, continue Sinemet 25/100 mg bid  Loose stool With her on seen discontinue this for now and monitor. Continue probiotic  HTn Stable bp reading. Continue losartan 50 mg daily and monitor   Oneal Grout, MD  [facility] [phone] (Monday-Friday 8 am - 5 pm) [phone] (afterhours)

## 2015-06-08 DIAGNOSIS — N3289 Other specified disorders of bladder: Secondary | ICD-10-CM | POA: Diagnosis not present

## 2015-06-08 DIAGNOSIS — N390 Urinary tract infection, site not specified: Secondary | ICD-10-CM | POA: Diagnosis not present

## 2015-06-08 DIAGNOSIS — E119 Type 2 diabetes mellitus without complications: Secondary | ICD-10-CM | POA: Diagnosis not present

## 2015-06-08 LAB — MICROALBUMIN, URINE: Microalb, Ur: 85.1

## 2015-06-09 DIAGNOSIS — R2681 Unsteadiness on feet: Secondary | ICD-10-CM | POA: Diagnosis not present

## 2015-06-09 DIAGNOSIS — Z9181 History of falling: Secondary | ICD-10-CM | POA: Diagnosis not present

## 2015-06-09 DIAGNOSIS — M6281 Muscle weakness (generalized): Secondary | ICD-10-CM | POA: Diagnosis not present

## 2015-06-09 DIAGNOSIS — M1460 Charcot's joint, unspecified site: Secondary | ICD-10-CM | POA: Diagnosis not present

## 2015-06-09 DIAGNOSIS — G629 Polyneuropathy, unspecified: Secondary | ICD-10-CM | POA: Diagnosis not present

## 2015-06-09 DIAGNOSIS — G20 Parkinson's disease: Secondary | ICD-10-CM | POA: Diagnosis not present

## 2015-06-10 DIAGNOSIS — M6281 Muscle weakness (generalized): Secondary | ICD-10-CM | POA: Diagnosis not present

## 2015-06-10 DIAGNOSIS — R2681 Unsteadiness on feet: Secondary | ICD-10-CM | POA: Diagnosis not present

## 2015-06-10 DIAGNOSIS — Z9181 History of falling: Secondary | ICD-10-CM | POA: Diagnosis not present

## 2015-06-10 DIAGNOSIS — M1460 Charcot's joint, unspecified site: Secondary | ICD-10-CM | POA: Diagnosis not present

## 2015-06-10 DIAGNOSIS — G629 Polyneuropathy, unspecified: Secondary | ICD-10-CM | POA: Diagnosis not present

## 2015-06-10 DIAGNOSIS — G20 Parkinson's disease: Secondary | ICD-10-CM | POA: Diagnosis not present

## 2015-06-11 DIAGNOSIS — G629 Polyneuropathy, unspecified: Secondary | ICD-10-CM | POA: Diagnosis not present

## 2015-06-11 DIAGNOSIS — M6281 Muscle weakness (generalized): Secondary | ICD-10-CM | POA: Diagnosis not present

## 2015-06-11 DIAGNOSIS — R2681 Unsteadiness on feet: Secondary | ICD-10-CM | POA: Diagnosis not present

## 2015-06-11 DIAGNOSIS — Z9181 History of falling: Secondary | ICD-10-CM | POA: Diagnosis not present

## 2015-06-11 DIAGNOSIS — G20 Parkinson's disease: Secondary | ICD-10-CM | POA: Diagnosis not present

## 2015-06-11 DIAGNOSIS — M1460 Charcot's joint, unspecified site: Secondary | ICD-10-CM | POA: Diagnosis not present

## 2015-06-12 DIAGNOSIS — M1460 Charcot's joint, unspecified site: Secondary | ICD-10-CM | POA: Diagnosis not present

## 2015-06-12 DIAGNOSIS — R2681 Unsteadiness on feet: Secondary | ICD-10-CM | POA: Diagnosis not present

## 2015-06-12 DIAGNOSIS — G20 Parkinson's disease: Secondary | ICD-10-CM | POA: Diagnosis not present

## 2015-06-12 DIAGNOSIS — G629 Polyneuropathy, unspecified: Secondary | ICD-10-CM | POA: Diagnosis not present

## 2015-06-12 DIAGNOSIS — M6281 Muscle weakness (generalized): Secondary | ICD-10-CM | POA: Diagnosis not present

## 2015-06-12 DIAGNOSIS — Z9181 History of falling: Secondary | ICD-10-CM | POA: Diagnosis not present

## 2015-06-16 DIAGNOSIS — M6281 Muscle weakness (generalized): Secondary | ICD-10-CM | POA: Diagnosis not present

## 2015-06-16 DIAGNOSIS — Z9181 History of falling: Secondary | ICD-10-CM | POA: Diagnosis not present

## 2015-06-16 DIAGNOSIS — G20 Parkinson's disease: Secondary | ICD-10-CM | POA: Diagnosis not present

## 2015-06-16 DIAGNOSIS — R2681 Unsteadiness on feet: Secondary | ICD-10-CM | POA: Diagnosis not present

## 2015-06-16 DIAGNOSIS — G629 Polyneuropathy, unspecified: Secondary | ICD-10-CM | POA: Diagnosis not present

## 2015-06-16 DIAGNOSIS — M1460 Charcot's joint, unspecified site: Secondary | ICD-10-CM | POA: Diagnosis not present

## 2015-06-18 DIAGNOSIS — Z9181 History of falling: Secondary | ICD-10-CM | POA: Diagnosis not present

## 2015-06-18 DIAGNOSIS — M1460 Charcot's joint, unspecified site: Secondary | ICD-10-CM | POA: Diagnosis not present

## 2015-06-18 DIAGNOSIS — R2681 Unsteadiness on feet: Secondary | ICD-10-CM | POA: Diagnosis not present

## 2015-06-18 DIAGNOSIS — G20 Parkinson's disease: Secondary | ICD-10-CM | POA: Diagnosis not present

## 2015-06-18 DIAGNOSIS — G629 Polyneuropathy, unspecified: Secondary | ICD-10-CM | POA: Diagnosis not present

## 2015-06-18 DIAGNOSIS — M6281 Muscle weakness (generalized): Secondary | ICD-10-CM | POA: Diagnosis not present

## 2015-06-24 DIAGNOSIS — F0390 Unspecified dementia without behavioral disturbance: Secondary | ICD-10-CM | POA: Diagnosis not present

## 2015-06-24 DIAGNOSIS — F329 Major depressive disorder, single episode, unspecified: Secondary | ICD-10-CM | POA: Diagnosis not present

## 2015-06-24 DIAGNOSIS — F39 Unspecified mood [affective] disorder: Secondary | ICD-10-CM | POA: Diagnosis not present

## 2015-08-19 ENCOUNTER — Ambulatory Visit: Payer: Medicare Other | Admitting: Urology

## 2015-08-24 ENCOUNTER — Encounter: Payer: Self-pay | Admitting: Urology

## 2015-08-24 ENCOUNTER — Ambulatory Visit: Payer: Medicare Other | Admitting: Urology

## 2015-08-25 ENCOUNTER — Ambulatory Visit: Payer: Medicare Other | Admitting: Urology

## 2015-08-26 ENCOUNTER — Ambulatory Visit (INDEPENDENT_AMBULATORY_CARE_PROVIDER_SITE_OTHER): Payer: Medicare Other | Admitting: Urology

## 2015-08-26 ENCOUNTER — Encounter: Payer: Self-pay | Admitting: Urology

## 2015-08-26 VITALS — BP 151/71 | HR 81 | Ht 64.0 in | Wt 245.3 lb

## 2015-08-26 DIAGNOSIS — N952 Postmenopausal atrophic vaginitis: Secondary | ICD-10-CM | POA: Diagnosis not present

## 2015-08-26 DIAGNOSIS — R32 Unspecified urinary incontinence: Secondary | ICD-10-CM

## 2015-08-26 DIAGNOSIS — N390 Urinary tract infection, site not specified: Secondary | ICD-10-CM

## 2015-08-26 LAB — URINALYSIS, COMPLETE
Bilirubin, UA: NEGATIVE
GLUCOSE, UA: NEGATIVE
Ketones, UA: NEGATIVE
Nitrite, UA: POSITIVE — AB
PH UA: 5 (ref 5.0–7.5)
Specific Gravity, UA: >1.03 — ABNORMAL HIGH (ref 1.005–1.030)
Urobilinogen, Ur: 0.2 mg/dL (ref 0.2–1.0)

## 2015-08-26 LAB — MICROSCOPIC EXAMINATION
RBC, UA: NONE SEEN /hpf (ref 0–?)
WBC, UA: >30 /hpf — ABNORMAL HIGH (ref 0–?)

## 2015-08-26 NOTE — Progress Notes (Signed)
2:54 PM   Maureen Taylor 01-09-1935 [id number]  Referring provider: No referring provider defined for this encounter.  Chief Complaint  Patient presents with  . Recurrent UTI    recheck     HPI: Maureen Taylor is a 80 year old white female who has a history of recurrent UTI's, atrophic vaginitis and incontinence who presents today for follow up.    Recurrent UTI  Patient has had a positive urine for Klebsiella 05/27/2014, a positive urine culture for Escherichia coli and strep viridians on 10/30/2014, a positive urine culture for Citrobacter freudii on 02/19/2015, a positive urine culture for Escherichia coli on 05/05/2015 and a positive urine culture on 06/08/2015 for Klebsiella pneumoniae.  Her symptoms at the time of the infections are dysuria and vaginal itching.  Imaging studies performed in June 2016 (RUS and KUB) does not identify any nephrolithiasis or hydronephrosis.    Atrophic vaginitis Patient was initiated on vaginal estrogen cream back in April 2016.  She states she has noticed an improvement in her vaginal dryness and burning.  According to her [facility], she is receiving half a gram of the cream on Tuesdays and Saturdays nights.    Incontinence: Patient had her oxybutynin 5 mg increased to 3 times a day.   She is still experiencing episodes of incontinence.     She is not experiencing any fevers, chills, nausea or vomiting.  She also denies any suprapubic pain or gross hematuria.  PMH: Past Medical History  Diagnosis Date  . Vitamin deficiency   . Rosacea   . Candidiasis   . Diabetes mellitus, type 2 (HCC)   . Magnesium deficiency   . History of falling   . Mixed incontinence   . Ascorbic acid deficiency   . Slow transit constipation   . Long term current use of insulin (HCC)   . Pneumonia   . GERD (gastroesophageal reflux disease)   . Major depressive disorder (HCC)   . Allergic rhinitis   . Charcot's joint   . Muscle weakness   . Obesity   . OAB  (overactive bladder)   . Vaginal atrophy   . Parkinson disease [facility])     Surgical History: Past Surgical History  Procedure Laterality Date  . Cholecystectomy    . Abdominal hysterectomy    . Revision total knee arthroplasty Right   . Cervical conization w/bx      Home Medications:    Medication List       This list is accurate as of: 08/26/15  2:54 PM.  Always use your most recent med list.               antiseptic oral rinse Liqd  10 mLs by Mouth Rinse route daily.     aspirin 81 MG EC tablet  Take 81 mg by mouth daily. Swallow whole.     carbidopa-levodopa 25-100 MG tablet  Commonly known as:  SINEMET IR  Take 1 tablet by mouth 2 (two) times daily.     Cranberry 200 MG Caps  Take 2 capsules by mouth daily.     DULoxetine 60 MG capsule  Commonly known as:  CYMBALTA  Take 60 mg by mouth daily.     estrogens (conjugated) 0.625 MG tablet  Commonly known as:  PREMARIN  Take 0.625 mg by mouth daily. Take daily for 21 days then do not take for 7 days.     gabapentin 100 MG capsule  Commonly known as:  NEURONTIN  Take  by mouth 2 (two) times daily. Take 2 capsules twice a day     glimepiride 4 MG tablet  Commonly known as:  AMARYL  Take 4 mg by mouth daily with breakfast.     insulin glargine 100 UNIT/ML injection  Commonly known as:  LANTUS  Inject 26 Units into the skin at bedtime.     lamoTRIgine 100 MG tablet  Commonly known as:  LAMICTAL  Take 100 mg by mouth 2 (two) times daily.     loratadine 10 MG tablet  Commonly known as:  CLARITIN  Take 10 mg by mouth daily.     losartan 50 MG tablet  Commonly known as:  COZAAR  Take 50 mg by mouth daily.     magnesium oxide 400 MG tablet  Commonly known as:  MAG-OX  Take 400 mg by mouth daily.     metoprolol tartrate 25 MG tablet  Commonly known as:  LOPRESSOR  Take 25 mg by mouth 2 (two) times daily. Take 25 mg by mouth twice a day for HTN     oxybutynin 5 MG tablet  Commonly known as:  DITROPAN    Take 5 mg by mouth 3 (three) times daily.     pantoprazole 40 MG tablet  Commonly known as:  PROTONIX  Take 40 mg by mouth daily.     PROBIOTIC ACIDOPHILUS Tabs  Take by mouth. 1 capsule by mouth QD     senna 8.6 MG tablet  Commonly known as:  SENOKOT  Take 1 tablet by mouth daily.     THEREMS M PO  Take 1 tablet by mouth daily.        Allergies:  Allergies  Allergen Reactions  . Codeine   . Indocin [Indomethacin]   . Morphine And Related     Family History: Family History  Problem Relation Age of Onset  . Kidney disease Neg Hx   . Bladder Cancer Neg Hx   . Prostate cancer Neg Hx     Social History:  reports that she has never smoked. She does not have any smokeless tobacco history on file. She reports that she does not drink alcohol or use illicit drugs.  ROS: UROLOGY Frequent Urination?: Yes Hard to postpone urination?: No Burning/pain with urination?: Yes Get up at night to urinate?: Yes Leakage of urine?: Yes Urine stream starts and stops?: No Trouble starting stream?: No Do you have to strain to urinate?: No Blood in urine?: No Urinary tract infection?: Yes Sexually transmitted disease?: No Injury to kidneys or bladder?: No Painful intercourse?: No Weak stream?: No Currently pregnant?: No Vaginal bleeding?: No Last menstrual period?: n  Gastrointestinal Nausea?: No Vomiting?: No Indigestion/heartburn?: No Diarrhea?: Yes Constipation?: No  Constitutional Fever: No Night sweats?: No Weight loss?: No Fatigue?: No  Skin Skin rash/lesions?: No Itching?: Yes  Eyes Blurred vision?: No Double vision?: No  Ears/Nose/Throat Sore throat?: No Sinus problems?: No  Hematologic/Lymphatic Swollen glands?: No Easy bruising?: No  Cardiovascular Leg swelling?: No Chest pain?: No  Respiratory Cough?: No Shortness of breath?: No  Endocrine Excessive thirst?: No  Musculoskeletal Back pain?: Yes Joint pain?:  No  Neurological Headaches?: No Dizziness?: No  Psychologic Depression?: No Anxiety?: No  Physical Exam: Blood pressure 151/71, pulse 81, height 5\' 4"  (1.626 m), weight 245 lb 4.8 oz (111.267 kg). GU:  Atrophic external genitalia, normal pubic hair distribution, no lesions.  Normal urethral meatus, no lesions, no prolapse, no discharge.   No urethral masses, tenderness and/or tenderness.  No bladder fullness, tenderness or masses.   Laboratory Data: Lab Results  Component Value Date   WBC 13.5* 06/01/2014   HGB 12.8 06/01/2014   HCT 38.8 06/01/2014   MCV 92 06/01/2014   PLT 235 06/01/2014    Lab Results  Component Value Date   CREATININE 1.09 05/31/2014     Lab Results  Component Value Date   HGBA1C 9.0* 03/22/2013    Urinalysis Results for orders placed or performed in visit on 08/26/15  Microscopic Examination  Result Value Ref Range   WBC, UA >30 (H) 0 -  5 /hpf   RBC, UA None seen 0 -  2 /hpf   Epithelial Cells (non renal) 0-10 0 - 10 /hpf   Bacteria, UA Many (A) None seen/Few  Urinalysis, Complete  Result Value Ref Range   Specific Gravity, UA >1.030 (H) 1.005 - 1.030   pH, UA 5.0 5.0 - 7.5   Color, UA Yellow Yellow   Appearance Ur Cloudy (A) Clear   Leukocytes, UA 1+ (A) Negative   Protein, UA 1+ (A) Negative/Trace   Glucose, UA Negative Negative   Ketones, UA Negative Negative   RBC, UA 2+ (A) Negative   Bilirubin, UA Negative Negative   Urobilinogen, Ur 0.2 0.2 - 1.0 mg/dL   Nitrite, UA Positive (A) Negative   Microscopic Examination See below:      Assessment & Plan:    1. History of recurrent UTI's:   Patient's imaging studies are negative for stones.  She is experiencing dysuria at this time, but is difficult to know if this is due to vaginal atrophy.   I will send the urine for culture, but I will hold on the antibiotic until culture and sensitivities are available.  Once her current urinary tract infection is treated, we will try bladder  washings with gentamicin to see if we can break the cycle of her recurrent urinary tract infections.    2. Vaginal atrophy:   Patient is receiving the Premarin cream, 1/2 gram Tuesday and Saturday nights.   Vaginal mucosa still appears red and irritated.  This may also be to her body habitus and her non ambulatory status.    3. Incontinence:   Patient states that she is continuing the oxybutynin 5 mg 3 times a day. She is not complaining of dry mouth, but she does have chronic constipation. This was prior to taking the oxybutynin.  She will continue this medication as it is reducing the episodes of incontinence.     Michiel Cowboy, PA-C  [facility] [address] [place], [place] [address] [phone]

## 2015-08-26 NOTE — Patient Instructions (Signed)
Patient's urine will be sent for culture. Once culture results and sensitivities are available, we will prescribe the appropriate antibiotic.  Once his current urinary tract infection is treated, we will try bladder washings with gentamicin to see if we can break the cycle of her recurrent urinary tract infections.  It may be difficult though due to her diabetes and her stool incontinence.

## 2015-08-28 LAB — CULTURE, URINE COMPREHENSIVE

## 2015-08-31 ENCOUNTER — Telehealth: Payer: Self-pay

## 2015-08-31 DIAGNOSIS — N390 Urinary tract infection, site not specified: Secondary | ICD-10-CM

## 2015-08-31 NOTE — Telephone Encounter (Signed)
-----   Message from Harle BattiestShannon A McGowan, PA-C sent at 08/30/2015  7:02 PM EST ----- Patient has a +UCx.  They need to start Bactrim DS one tablet twice daily for seven days and then we need to check a CATH specimen in 3 to 5 days after they complete their antibiotics.

## 2015-08-31 NOTE — Telephone Encounter (Signed)
[facility]. Attempted to call 1st emergency contact no vm setup. LMOM for [employer]. No pharmacy in system therefore medication not called in yet.

## 2015-09-01 MED ORDER — SULFAMETHOXAZOLE-TRIMETHOPRIM 800-160 MG PO TABS: 1.0000 | ORAL_TABLET | Freq: Two times a day (BID) | ORAL | Status: DC

## 2015-09-01 NOTE — Telephone Encounter (Signed)
[facility] and message with recheck. With a cath specimen in three to five days after finishing antibiotic.

## 2015-09-01 NOTE — Telephone Encounter (Signed)
Shante with [facility] left message on VM this morning.  Stated that patient needs an antibiotic based on being seen in our office last week for an infection.  [facility] at [phone], attention Kim.

## 2015-09-03 ENCOUNTER — Encounter: Payer: Self-pay | Admitting: Adult Health

## 2015-09-03 ENCOUNTER — Non-Acute Institutional Stay (SKILLED_NURSING_FACILITY): Payer: Medicare Other | Admitting: Adult Health

## 2015-09-03 DIAGNOSIS — G20A1 Parkinson's disease without dyskinesia, without mention of fluctuations: Secondary | ICD-10-CM

## 2015-09-03 DIAGNOSIS — N3281 Overactive bladder: Secondary | ICD-10-CM | POA: Diagnosis not present

## 2015-09-03 DIAGNOSIS — I10 Essential (primary) hypertension: Secondary | ICD-10-CM

## 2015-09-03 DIAGNOSIS — J309 Allergic rhinitis, unspecified: Secondary | ICD-10-CM | POA: Diagnosis not present

## 2015-09-03 DIAGNOSIS — K219 Gastro-esophageal reflux disease without esophagitis: Secondary | ICD-10-CM

## 2015-09-03 DIAGNOSIS — Z79890 Hormone replacement therapy (postmenopausal): Secondary | ICD-10-CM | POA: Diagnosis not present

## 2015-09-03 DIAGNOSIS — E46 Unspecified protein-calorie malnutrition: Secondary | ICD-10-CM

## 2015-09-03 DIAGNOSIS — R682 Dry mouth, unspecified: Secondary | ICD-10-CM | POA: Diagnosis not present

## 2015-09-03 DIAGNOSIS — G629 Polyneuropathy, unspecified: Secondary | ICD-10-CM

## 2015-09-03 DIAGNOSIS — E785 Hyperlipidemia, unspecified: Secondary | ICD-10-CM

## 2015-09-03 DIAGNOSIS — G20 Parkinson's disease: Secondary | ICD-10-CM

## 2015-09-03 DIAGNOSIS — E1142 Type 2 diabetes mellitus with diabetic polyneuropathy: Secondary | ICD-10-CM

## 2015-09-03 DIAGNOSIS — F3130 Bipolar disorder, current episode depressed, mild or moderate severity, unspecified: Secondary | ICD-10-CM | POA: Diagnosis not present

## 2015-09-03 DIAGNOSIS — N390 Urinary tract infection, site not specified: Secondary | ICD-10-CM

## 2015-09-03 DIAGNOSIS — Z794 Long term (current) use of insulin: Secondary | ICD-10-CM

## 2015-09-03 DIAGNOSIS — N952 Postmenopausal atrophic vaginitis: Secondary | ICD-10-CM

## 2015-09-03 NOTE — Progress Notes (Signed)
Patient ID: Maureen Taylor, female   DOB: 1934/11/12, 80 y.o.   MRN: [id number]   DATE:   09/03/15  Facility:  [facility] Home Location:  [place] Room Number: 546-5 LEVEL OF CARE:  SNF (31)  Chief Complaint  Patient presents with  . Medical Management of Chronic Issues    Bipolar depression, dry mouth, protein calorie malnutrition, GERD, allergic rhinitis, hypertension, Parkinson's disease, neuropathy, diabetes mellitus type 2, OAB, UTI, Vaginal atrophy and hyperlipidemia    HISTORY OF PRESENT ILLNESS:  This is a 80 year old female who is being seen for a routine visit. Upon review of CBGs, it was noted that it ranges from 200s to HI.She is currently on ATB for UTI. Her mood is stable.  PAST MEDICAL HISTORY:   hypertension, depression, GERD, allergic rhinitis, diabetes mellitus, Parkinson's disease and neuropathy  CURRENT MEDICATIONS: Reviewed per MAR/see medication list    Medication List       This list is accurate as of: 09/03/15  7:12 PM.  Always use your most recent med list.               antiseptic oral rinse Liqd  10 mLs by Mouth Rinse route daily.     aspirin 81 MG EC tablet  Take 81 mg by mouth daily. Swallow whole.     carbidopa-levodopa 25-100 MG tablet  Commonly known as:  SINEMET IR  Take 1 tablet by mouth 2 (two) times daily.     conjugated estrogens vaginal cream  Commonly known as:  PREMARIN  Place 1 Applicatorful vaginally at bedtime. 2X a week @@ HS Wednesdays and Saturdays     Cranberry 200 MG Caps  Take 2 capsules by mouth daily.     DULoxetine 60 MG capsule  Commonly known as:  CYMBALTA  Take 60 mg by mouth daily.     gabapentin 100 MG capsule  Commonly known as:  NEURONTIN  Take by mouth 2 (two) times daily. Take 2 capsules twice a day     glimepiride 4 MG tablet  Commonly known as:  AMARYL  Take 4 mg by mouth daily with breakfast.     lamoTRIgine 100 MG tablet  Commonly known as:  LAMICTAL  Take 100  mg by mouth 2 (two) times daily.     loratadine 10 MG tablet  Commonly known as:  CLARITIN  Take 10 mg by mouth daily.     losartan 50 MG tablet  Commonly known as:  COZAAR  Take 50 mg by mouth daily.     magnesium oxide 400 MG tablet  Commonly known as:  MAG-OX  Take 400 mg by mouth daily.     metoprolol tartrate 25 MG tablet  Commonly known as:  LOPRESSOR  Take 25 mg by mouth 2 (two) times daily. Take 25 mg by mouth twice a day for HTN     oxybutynin 5 MG tablet  Commonly known as:  DITROPAN  Take 5 mg by mouth 3 (three) times daily.     pantoprazole 40 MG tablet  Commonly known as:  PROTONIX  Take 40 mg by mouth daily.     PROBIOTIC ACIDOPHILUS Tabs  Take by mouth. 1 capsule by mouth QD     THEREMS M PO  Take 1 tablet by mouth daily.         Allergies  Allergen Reactions  . Codeine   . Indocin [Indomethacin]   . Morphine And Related     REVIEW  OF SYSTEMS:  GENERAL: no change in appetite, no fatigue, no weight changes, no fever, chills or weakness RESPIRATORY: no cough, SOB, DOE, wheezing, hemoptysis CARDIAC: no chest pain, edema or palpitations GI: no abdominal pain, diarrhea, constipation, heart burn, nausea or vomiting  PHYSICAL EXAMINATION  GENERAL: no acute distress, obese EYES: conjunctivae normal, sclerae normal, normal eye lids NECK: supple, trachea midline, no neck masses, no thyroid tenderness, no thyromegaly LYMPHATICS: no LAN in the neck, no supraclavicular LAN RESPIRATORY: breathing is even & unlabored, BS CTAB CARDIAC: RRR, no murmur,no extra heart sounds, no edema GI: abdomen soft, normal BS, no masses, no tenderness, no hepatomegaly, no splenomegaly, + abdominal hernia EXTREMITIES:  Able to move 4 extremities; able to walk while holding on to the wheelchair PSYCHIATRIC: the patient is alert & oriented to person, affect & behavior appropriate  LABS/RADIOLOGY: Labs reviewed: 06/08/15  urine microalbumin 85.1  urine creatinine 77.8 WBC  11.2 hemoglobin 13.1 hematocrit 40.7 MCV 91.5 platelet 311 05/22/15  chest x-ray shows no acute cardiopulmonary finding 05/20/15  WBC 16.4 hemoglobin 15.4 hematocrit 44.9 MCV 87.9 platelet 333 05/12/15  WBC 11.3 hemoglobin 14.1 hematocrit 41.8 MCV 86.9 lately at 344 hemoglobin A1c 8.3 05/05/15  Urinalysis + for nitrites and many bacteria, wbc 11.3  hgb 13.6  hct 39.9  Platelet 310  NA 142  K 4.0  Glucose 179  BUN 18  Creatinine 0.72  Total bil 0.7  Alk phos 95  SGOT 16  SGPT 16  Total protein 6.2  Albumin 3.2  CA 8.9 vitamin D 25 03/27/15  chest x-ray shows cardiomegaly and no definite acute pulmonary pathology 03/06/15  WBC 7.0 hemoglobin 13.1 hematocrit 38.5 MCV 91.9 platelet count 267 sodium 144 potassium 3.7 glucose 129 BUN 19 creatinine 0.65 total bilirubin 0.3 alkaline phosphatase 61 SGOT 16 SGPT 14 albumin 3.0 total protein 5.8 cholesterol 155 triglycerides 316 HDL 27 LDL 65 01/20/15  right knee x-ray shows no acute osseous abnormality 01/08/15  chest x-ray shows borderline cardiomegaly, no definite focal pneumonia 01/07/15  cholesterol 177 triglycerides 128 HDL 29 LDL 130 TSH 1.628 01/06/15  WBC 11.1 hemoglobin 14.8 hematocrit 43.0 MCV 87.9 sodium 138 potassium 4.8 glucose 181 BUN 17 creatinine 0.85 total bilirubin 0.6 alkaline phosphatase 108 SGOT 13 SGPT <8 total protein 6.7 albumin 3.5 calcium 9.1 hemoglobin A1c 9.7 01/03/15  right hip x-ray shows nicely aligned right hip bipolar prosthesis without evidence of acute osseous pathology 10/29/14  WBC 9.9 hemoglobin 13.8 hematocrit 42.0 MCV 92.9 10/28/14  sodium 138 potassium 4.2 glucose 259 BUN 18 creatinine 0.9 calcium 9.3 total protein 6.3 albumin 3.6 total bilirubin 0.4 ALP 101 AST 14 ALT 14 GFR >60 WBC 10.9 hemoglobin 13.6 hematocrit 41.4 MCV 93.9 10/24/14  magnesium 2.0  ASSESSMENT/PLAN:  UTI - continue Bactrim DS 1 tab by mouth twice a day X 6 more days  Dry mouth - continue Biotene liquid 10 ml swish and spit daily  Protein-calorie  malnutrition - albumin 3.2; continue Procel 1 scoop by mouth twice a day; check CBC  Bipolar depression - mood is stable; continue Lamictal 100 mg by mouth twice a day  GERD - continue Protonix 40 mg by mouth daily   Allergic rhinitis - continue Claritin 10 mg by mouth daily  Hypertension - well-controlled; continue losartan 50 mg by mouth daily and metoprolol 25 mg by mouth twice a day  Parkinson's disease - continue Sinemet 25/100 mg by mouth twice a day  Neuropathy - continue gabapentin. 200 mg by mouth twice a day  and Cymbalta 60 mg by mouth daily  Diabetes mellitus, type II - increase Lantus to Lantus 24 units subcutaneous BID and at bedtime and Glimepiride 4 mg by mouth daily; check hgbA1c  OAB - recently increased  oxybutynin 5 mg by mouth TID  Vaginal atrophy - continue Premarin vaginal cream 0.625 mg insert half grams vaginally 2 times/week at bedtime  Hyperlipidemia - continue atorvastatin 10 mg 1 tab by mouth daily; check lipid panel      Goals of care:  long-term care   [facility], [facility] [phone]

## 2015-09-04 DIAGNOSIS — I10 Essential (primary) hypertension: Secondary | ICD-10-CM | POA: Diagnosis not present

## 2015-09-04 DIAGNOSIS — E119 Type 2 diabetes mellitus without complications: Secondary | ICD-10-CM | POA: Diagnosis not present

## 2015-09-04 LAB — HEPATIC FUNCTION PANEL
ALT: 4 U/L — AB (ref 7–35)
AST: 9 U/L — AB (ref 13–35)
Alkaline Phosphatase: 120 U/L (ref 25–125)
Bilirubin, Total: 0.5 mg/dL

## 2015-09-04 LAB — BASIC METABOLIC PANEL
BUN: 16 mg/dL (ref 4–21)
CREATININE: 1 mg/dL (ref 0.5–1.1)
Glucose: 159 mg/dL
POTASSIUM: 4.5 mmol/L (ref 3.4–5.3)
SODIUM: 138 mmol/L (ref 137–147)

## 2015-09-04 LAB — CBC AND DIFFERENTIAL
HCT: 41 % (ref 36–46)
HEMOGLOBIN: 13.5 g/dL (ref 12.0–16.0)
Platelets: 310 10*3/uL (ref 150–399)
WBC: 11.6 10^3/mL

## 2015-09-04 LAB — LIPID PANEL
Cholesterol: 136 mg/dL (ref 0–200)
HDL: 37 mg/dL (ref 35–70)
LDL Cholesterol: 70 mg/dL
Triglycerides: 148 mg/dL (ref 40–160)

## 2015-09-05 DIAGNOSIS — M25559 Pain in unspecified hip: Secondary | ICD-10-CM | POA: Diagnosis not present

## 2015-09-07 ENCOUNTER — Other Ambulatory Visit: Payer: Self-pay | Admitting: *Deleted

## 2015-09-07 DIAGNOSIS — D72829 Elevated white blood cell count, unspecified: Secondary | ICD-10-CM | POA: Diagnosis not present

## 2015-09-07 MED ORDER — TRAMADOL HCL 50 MG PO TABS: ORAL_TABLET | ORAL | Status: DC

## 2015-09-07 NOTE — Telephone Encounter (Signed)
[facility] 

## 2015-09-08 DIAGNOSIS — F3130 Bipolar disorder, current episode depressed, mild or moderate severity, unspecified: Secondary | ICD-10-CM | POA: Diagnosis not present

## 2015-09-08 DIAGNOSIS — F0390 Unspecified dementia without behavioral disturbance: Secondary | ICD-10-CM | POA: Diagnosis not present

## 2015-09-08 DIAGNOSIS — F329 Major depressive disorder, single episode, unspecified: Secondary | ICD-10-CM | POA: Diagnosis not present

## 2015-09-14 DIAGNOSIS — N390 Urinary tract infection, site not specified: Secondary | ICD-10-CM | POA: Diagnosis not present

## 2015-09-23 ENCOUNTER — Encounter: Payer: Self-pay | Admitting: *Deleted

## 2015-09-29 DIAGNOSIS — F39 Unspecified mood [affective] disorder: Secondary | ICD-10-CM | POA: Diagnosis not present

## 2015-09-29 DIAGNOSIS — F329 Major depressive disorder, single episode, unspecified: Secondary | ICD-10-CM | POA: Diagnosis not present

## 2015-09-29 DIAGNOSIS — F3130 Bipolar disorder, current episode depressed, mild or moderate severity, unspecified: Secondary | ICD-10-CM | POA: Diagnosis not present

## 2015-09-29 DIAGNOSIS — F0390 Unspecified dementia without behavioral disturbance: Secondary | ICD-10-CM | POA: Diagnosis not present

## 2015-10-20 ENCOUNTER — Non-Acute Institutional Stay (SKILLED_NURSING_FACILITY): Payer: Medicare Other | Admitting: Adult Health

## 2015-10-20 ENCOUNTER — Encounter: Payer: Self-pay | Admitting: Adult Health

## 2015-10-20 DIAGNOSIS — G629 Polyneuropathy, unspecified: Secondary | ICD-10-CM | POA: Diagnosis not present

## 2015-10-20 DIAGNOSIS — E46 Unspecified protein-calorie malnutrition: Secondary | ICD-10-CM

## 2015-10-20 DIAGNOSIS — J309 Allergic rhinitis, unspecified: Secondary | ICD-10-CM

## 2015-10-20 DIAGNOSIS — K1379 Other lesions of oral mucosa: Secondary | ICD-10-CM | POA: Diagnosis not present

## 2015-10-20 DIAGNOSIS — N952 Postmenopausal atrophic vaginitis: Secondary | ICD-10-CM | POA: Diagnosis not present

## 2015-10-20 DIAGNOSIS — I10 Essential (primary) hypertension: Secondary | ICD-10-CM

## 2015-10-20 DIAGNOSIS — E1142 Type 2 diabetes mellitus with diabetic polyneuropathy: Secondary | ICD-10-CM | POA: Diagnosis not present

## 2015-10-20 DIAGNOSIS — G20 Parkinson's disease: Secondary | ICD-10-CM

## 2015-10-20 DIAGNOSIS — K219 Gastro-esophageal reflux disease without esophagitis: Secondary | ICD-10-CM

## 2015-10-20 DIAGNOSIS — F3130 Bipolar disorder, current episode depressed, mild or moderate severity, unspecified: Secondary | ICD-10-CM

## 2015-10-20 DIAGNOSIS — E785 Hyperlipidemia, unspecified: Secondary | ICD-10-CM | POA: Diagnosis not present

## 2015-10-20 DIAGNOSIS — Z794 Long term (current) use of insulin: Secondary | ICD-10-CM

## 2015-10-20 DIAGNOSIS — N3281 Overactive bladder: Secondary | ICD-10-CM | POA: Diagnosis not present

## 2015-10-20 NOTE — Progress Notes (Signed)
Patient ID: Maureen Taylor, female   DOB: [birth year], 81 y.o.   MRN: [id number]   DATE:   10/20/15  Facility:  [facility]urs[facility] Home Location:  [place] Room Number: 536-6 LEVEL OF CARE:  SNF (31)  Chief Complaint  Patient presents with  . Medical Management of Chronic Issues    Bipolar depression, oral pain, protein calorie malnutrition, GERD, allergic rhinitis, hypertension, Parkinson's disease, neuropathy, diabetes mellitus type 2, OAB, UTI, Vaginal atrophy and hyperlipidemia    HISTORY OF PRESENT ILLNESS:  This is a 81 year old female who is being seen for a routine visit. She complaining of oral pain. No oral lesion noted. She was recently started on Abilify for bipolar disorder.    PAST MEDICAL HISTORY:   hypertension, depression, GERD, allergic rhinitis, diabetes mellitus, Parkinson's disease and neuropathy  CURRENT MEDICATIONS: Reviewed per MAR/see medication list    Medication List       This list is accurate as of: 10/20/15  6:01 PM.  Always use your most recent med list.               acetaminophen 325 MG tablet  Commonly known as:  TYLENOL  Take 650 mg by mouth every 8 (eight) hours as needed.     ARIPiprazole 2 MG tablet  Commonly known as:  ABILIFY  Take 2 mg by mouth daily.     aspirin 81 MG EC tablet  Take 81 mg by mouth daily. Swallow whole.     atorvastatin 10 MG tablet  Commonly known as:  LIPITOR  Take 10 mg by mouth daily.     carbidopa-levodopa 25-100 MG tablet  Commonly known as:  SINEMET IR  Take 1 tablet by mouth 2 (two) times daily.     conjugated estrogens vaginal cream  Commonly known as:  PREMARIN  Insert 1/2 gram vaginally twice weekly on Wednesday and Saturday at bedtime for vaginal atropy     Cranberry 200 MG Caps  Take 400 mg by mouth daily.     DULoxetine 60 MG capsule  Commonly known as:  CYMBALTA  Take 60 mg by mouth daily.     gabapentin 100 MG capsule  Commonly known as:  NEURONTIN  Take 200  mg by mouth 2 (two) times daily.     glimepiride 4 MG tablet  Commonly known as:  AMARYL  Take 4 mg by mouth daily with breakfast.     insulin aspart 100 UNIT/ML injection  Commonly known as:  novoLOG  If CBG >400, Give NOVOLOG 8 UNITS SQ BID PRN     insulin glargine 100 UNIT/ML injection  Commonly known as:  LANTUS  24 Units SQ BID (AM and HS)     lamoTRIgine 100 MG tablet  Commonly known as:  LAMICTAL  Take 100 mg by mouth 2 (two) times daily.     loperamide 2 MG capsule  Commonly known as:  IMODIUM  Take 2 tablets = 15m by mouth initially then 1 tablet =249mafter each episode of diarrhea. DO NOT EXCEED 16MG IN 24 HOUR PERIOD IF DIARRHEA PERSISTS MORE.     loratadine 10 MG tablet  Commonly known as:  CLARITIN  Take 10 mg by mouth daily.     losartan 50 MG tablet  Commonly known as:  COZAAR  Take 50 mg by mouth daily.     magic mouthwash Soln  Take 10 mLs by mouth 3 (three) times daily as needed for mouth pain.  magnesium hydroxide 400 MG/5ML suspension  Commonly known as:  MILK OF MAGNESIA  45 mL PO q day prn x 2 days per standing orders. (DO NOT GIVE TO DIALYSIS PATIENTS)     magnesium oxide 400 MG tablet  Commonly known as:  MAG-OX  Take 400 mg by mouth daily.     metoprolol tartrate 25 MG tablet  Commonly known as:  LOPRESSOR  Take 25 mg by mouth 2 (two) times daily. Take 25 mg by mouth twice a day for HTN     oxybutynin 5 MG tablet  Commonly known as:  DITROPAN  Take 5 mg by mouth 3 (three) times daily.     pantoprazole 40 MG tablet  Commonly known as:  PROTONIX  Take 40 mg by mouth daily.     PROBIOTIC ACIDOPHILUS Tabs  Take by mouth. 1 capsule by mouth QD     PROCEL PO  Mix 1 scoop as directed and take by mouth twice daily. For nutritional support/protein. *USE HOUSE STOCK*     SYSTANE BALANCE OP  Apply 1 drop to eye 2 (two) times daily. Both eyes     THEREMS M PO  Take 1 tablet by mouth daily.     traMADol 50 MG tablet  Commonly known as:   ULTRAM  Take one tablet by mouth three time daily as needed for severe pain         Allergies  Allergen Reactions  . Codeine   . Indocin [Indomethacin]   . Morphine And Related     REVIEW OF SYSTEMS:  GENERAL: no change in appetite, no fatigue, no weight changes, no fever, chills or weakness RESPIRATORY: no cough, SOB, DOE, wheezing, hemoptysis CARDIAC: no chest pain, edema or palpitations GI: no abdominal pain, diarrhea, constipation, heart burn, nausea or vomiting  PHYSICAL EXAMINATION  GENERAL: no acute distress, obese EYES: conjunctivae normal, sclerae normal, normal eye lids NECK: supple, trachea midline, no neck masses, no thyroid tenderness, no thyromegaly LYMPHATICS: no LAN in the neck, no supraclavicular LAN RESPIRATORY: breathing is even & unlabored, BS CTAB CARDIAC: RRR, no murmur,no extra heart sounds, no edema GI: abdomen soft, normal BS, no masses, no tenderness, no hepatomegaly, no splenomegaly, + abdominal hernia EXTREMITIES:  Able to move 4 extremities; able to walk with walker PSYCHIATRIC: the patient is alert & oriented to person, affect & behavior appropriate  LABS/RADIOLOGY: Labs reviewed: Basic Metabolic Panel:  Recent Labs  09/04/15  NA 138  K 4.5  BUN 16  CREATININE 1.0   Liver Function Tests:  Recent Labs  09/04/15  AST 9*  ALT 4*  ALKPHOS 120   CBC:  Recent Labs  09/04/15  WBC 11.6  HGB 13.5  HCT 41  PLT 310   Lipid Panel:  Recent Labs  09/04/15  HDL 37     06/08/15  urine microalbumin 85.1  urine creatinine 77.8 WBC 11.2 hemoglobin 13.1 hematocrit 40.7 MCV 91.5 platelet 311 05/22/15  chest x-ray shows no acute cardiopulmonary finding 05/20/15  WBC 16.4 hemoglobin 15.4 hematocrit 44.9 MCV 87.9 platelet 333 05/12/15  WBC 11.3 hemoglobin 14.1 hematocrit 41.8 MCV 86.9 lately at 344 hemoglobin A1c 8.3 05/05/15  Urinalysis + for nitrites and many bacteria, wbc 11.3  hgb 13.6  hct 39.9  Platelet 310  NA 142  K 4.0  Glucose 179   BUN 18  Creatinine 0.72  Total bil 0.7  Alk phos 95  SGOT 16  SGPT 16  Total protein 6.2  Albumin 3.2  CA  8.9 vitamin D 25 03/27/15  chest x-ray shows cardiomegaly and no definite acute pulmonary pathology 03/06/15  WBC 7.0 hemoglobin 13.1 hematocrit 38.5 MCV 91.9 platelet count 267 sodium 144 potassium 3.7 glucose 129 BUN 19 creatinine 0.65 total bilirubin 0.3 alkaline phosphatase 61 SGOT 16 SGPT 14 albumin 3.0 total protein 5.8 cholesterol 155 triglycerides 316 HDL 27 LDL 65 01/20/15  right knee x-ray shows no acute osseous abnormality 01/08/15  chest x-ray shows borderline cardiomegaly, no definite focal pneumonia 01/07/15  cholesterol 177 triglycerides 128 HDL 29 LDL 130 TSH 1.628 01/06/15  WBC 11.1 hemoglobin 14.8 hematocrit 43.0 MCV 87.9 sodium 138 potassium 4.8 glucose 181 BUN 17 creatinine 0.85 total bilirubin 0.6 alkaline phosphatase 108 SGOT 13 SGPT <8 total protein 6.7 albumin 3.5 calcium 9.1 hemoglobin A1c 9.7 01/03/15  right hip x-ray shows nicely aligned right hip bipolar prosthesis without evidence of acute osseous pathology 10/29/14  WBC 9.9 hemoglobin 13.8 hematocrit 42.0 MCV 92.9 10/28/14  sodium 138 potassium 4.2 glucose 259 BUN 18 creatinine 0.9 calcium 9.3 total protein 6.3 albumin 3.6 total bilirubin 0.4 ALP 101 AST 14 ALT 14 GFR >60 WBC 10.9 hemoglobin 13.6 hematocrit 41.4 MCV 93.9 10/24/14  magnesium 2.0  ASSESSMENT/PLAN:  Oral pain  - discontinue Biotene and start Magic mouthwash swish and spit TID PRN  Protein-calorie malnutrition -  continue Procel 1 scoop by mouth twice a day  Bipolar depression - mood is stable; continue Lamictal 100 mg by mouth twice a day, Abilify 2 mg daily and Cymbalta 60 mg daily  GERD - continue Protonix 40 mg by mouth daily   Allergic rhinitis - continue Claritin 10 mg by mouth daily  Hypertension - well-controlled; continue losartan 50 mg by mouth daily and metoprolol 25 mg by mouth twice a day  Parkinson's disease - continue Sinemet 25/100 mg by  mouth twice a day  Neuropathy - continue gabapentin. 200 mg by mouth twice a day and Cymbalta 60 mg by mouth daily  Diabetes mellitus, type II - increase Lantus to Lantus 24 units subcutaneous BID, Novolog 8 units SQ BID PRN for CBG > 400 and at bedtime and Glimepiride 4 mg by mouth daily  OAB - continue oxybutynin 5 mg by mouth TID  Vaginal atrophy - continue Premarin vaginal cream 0.625 mg insert half grams vaginally 2 times/week at bedtime  Hyperlipidemia - continue atorvastatin 10 mg 1 tab by mouth daily; check lipid panel      Goals of care:  long-term care   [facility], [facility] [phone]

## 2015-10-23 DIAGNOSIS — E1140 Type 2 diabetes mellitus with diabetic neuropathy, unspecified: Secondary | ICD-10-CM | POA: Diagnosis not present

## 2015-10-23 DIAGNOSIS — M79671 Pain in right foot: Secondary | ICD-10-CM | POA: Diagnosis not present

## 2015-10-23 DIAGNOSIS — M79672 Pain in left foot: Secondary | ICD-10-CM | POA: Diagnosis not present

## 2015-10-23 DIAGNOSIS — B351 Tinea unguium: Secondary | ICD-10-CM | POA: Diagnosis not present

## 2015-11-10 ENCOUNTER — Non-Acute Institutional Stay (SKILLED_NURSING_FACILITY): Payer: Medicare Other | Admitting: Adult Health

## 2015-11-10 ENCOUNTER — Encounter: Payer: Self-pay | Admitting: Adult Health

## 2015-11-10 DIAGNOSIS — N952 Postmenopausal atrophic vaginitis: Secondary | ICD-10-CM

## 2015-11-10 DIAGNOSIS — E785 Hyperlipidemia, unspecified: Secondary | ICD-10-CM

## 2015-11-10 DIAGNOSIS — G629 Polyneuropathy, unspecified: Secondary | ICD-10-CM

## 2015-11-10 DIAGNOSIS — I10 Essential (primary) hypertension: Secondary | ICD-10-CM | POA: Diagnosis not present

## 2015-11-10 DIAGNOSIS — K1379 Other lesions of oral mucosa: Secondary | ICD-10-CM

## 2015-11-10 DIAGNOSIS — Z794 Long term (current) use of insulin: Secondary | ICD-10-CM

## 2015-11-10 DIAGNOSIS — F3130 Bipolar disorder, current episode depressed, mild or moderate severity, unspecified: Secondary | ICD-10-CM | POA: Diagnosis not present

## 2015-11-10 DIAGNOSIS — N3281 Overactive bladder: Secondary | ICD-10-CM | POA: Diagnosis not present

## 2015-11-10 DIAGNOSIS — E46 Unspecified protein-calorie malnutrition: Secondary | ICD-10-CM | POA: Diagnosis not present

## 2015-11-10 DIAGNOSIS — G20 Parkinson's disease: Secondary | ICD-10-CM

## 2015-11-10 DIAGNOSIS — E1142 Type 2 diabetes mellitus with diabetic polyneuropathy: Secondary | ICD-10-CM | POA: Diagnosis not present

## 2015-11-10 DIAGNOSIS — J309 Allergic rhinitis, unspecified: Secondary | ICD-10-CM | POA: Diagnosis not present

## 2015-11-10 DIAGNOSIS — K219 Gastro-esophageal reflux disease without esophagitis: Secondary | ICD-10-CM

## 2015-11-10 DIAGNOSIS — N390 Urinary tract infection, site not specified: Secondary | ICD-10-CM | POA: Diagnosis not present

## 2015-11-10 NOTE — Progress Notes (Addendum)
Patient ID: Maureen Taylor, female   DOB: [birth year], 81 y.o.   MRN: [id number]   DATE:   11/10/15  Facility:  [facility] Home Location:  [place] Room Number: [id number] LEVEL OF CARE:  SNF (31)  Chief Complaint  Patient presents with  . Medical Management of Chronic Issues    Bipolar depression, oral pain, protein calorie malnutrition, GERD, allergic rhinitis, hypertension, Parkinson's disease, neuropathy, diabetes mellitus type 2, OAB, Vaginal atrophy and hyperlipidemia    HISTORY OF PRESENT ILLNESS:  This is a 81 year old female who is being seen for a routine visit. PT was recently discontinued and was transitioned to RNP.  Parkinson's disease is table and currently on Sinemet 25-100 mg twice a day. She has been having periods of confusion. Urinalysis was ordered. Her mood is stable and continues to take Lamictal, Abilify and Cymbalta for bipolar depression.  PAST MEDICAL HISTORY:   hypertension, depression, GERD, allergic rhinitis, diabetes mellitus, Parkinson's disease and neuropathy  CURRENT MEDICATIONS: Reviewed per MAR/see medication list    Medication List       This list is accurate as of: 11/10/15 11:59 PM.  Always use your most recent med list.               acetaminophen 325 MG tablet  Commonly known as:  TYLENOL  Take 650 mg by mouth every 8 (eight) hours as needed.     ARIPiprazole 2 MG tablet  Commonly known as:  ABILIFY  Take 2 mg by mouth daily.     aspirin 81 MG EC tablet  Take 81 mg by mouth daily. Swallow whole.     atorvastatin 10 MG tablet  Commonly known as:  LIPITOR  Take 10 mg by mouth daily.     carbidopa-levodopa 25-100 MG tablet  Commonly known as:  SINEMET IR  Take 1 tablet by mouth 2 (two) times daily.     conjugated estrogens vaginal cream  Commonly known as:  PREMARIN  Insert 1/2 gram vaginally twice weekly on Wednesday and Saturday at bedtime for vaginal atropy     Cranberry 200 MG Caps  Take 400 mg by  mouth daily.     DULoxetine 60 MG capsule  Commonly known as:  CYMBALTA  Take 60 mg by mouth daily.     gabapentin 100 MG capsule  Commonly known as:  NEURONTIN  Take 200 mg by mouth 2 (two) times daily.     glimepiride 4 MG tablet  Commonly known as:  AMARYL  Take 4 mg by mouth daily with breakfast.     insulin aspart 100 UNIT/ML injection  Commonly known as:  novoLOG  If CBG >400, Give NOVOLOG 8 UNITS SQ BID PRN     insulin glargine 100 UNIT/ML injection  Commonly known as:  LANTUS  24 Units SQ BID (AM and HS)     lamoTRIgine 100 MG tablet  Commonly known as:  LAMICTAL  Take 100 mg by mouth 2 (two) times daily.     loperamide 2 MG capsule  Commonly known as:  IMODIUM  Take 2 tablets = 50m by mouth initially then 1 tablet =222mafter each episode of diarrhea. DO NOT EXCEED 16MG IN 24 HOUR PERIOD IF DIARRHEA PERSISTS MORE.     loratadine 10 MG tablet  Commonly known as:  CLARITIN  Take 10 mg by mouth daily.     losartan 50 MG tablet  Commonly known as:  COZAAR  Take 50 mg by mouth  daily.     magic mouthwash Soln  Take 10 mLs by mouth 3 (three) times daily as needed for mouth pain.     magnesium hydroxide 400 MG/5ML suspension  Commonly known as:  MILK OF MAGNESIA  45 mL PO q day prn x 2 days per standing orders. (DO NOT GIVE TO DIALYSIS PATIENTS)     magnesium oxide 400 MG tablet  Commonly known as:  MAG-OX  Take 400 mg by mouth daily.     metoprolol tartrate 25 MG tablet  Commonly known as:  LOPRESSOR  Take 25 mg by mouth 2 (two) times daily. Take 25 mg by mouth twice a day for HTN     oxybutynin 5 MG tablet  Commonly known as:  DITROPAN  Take 5 mg by mouth 3 (three) times daily.     pantoprazole 40 MG tablet  Commonly known as:  PROTONIX  Take 40 mg by mouth daily.     PROBIOTIC ACIDOPHILUS Tabs  Take by mouth. 1 capsule by mouth QD     PROCEL PO  Mix 1 scoop as directed and take by mouth twice daily. For nutritional support/protein. *USE HOUSE STOCK*      SYSTANE BALANCE OP  Apply 1 drop to eye 2 (two) times daily. Both eyes     THEREMS M PO  Take 1 tablet by mouth daily.     traMADol 50 MG tablet  Commonly known as:  ULTRAM  Take one tablet by mouth three time daily as needed for severe pain         Allergies  Allergen Reactions  . Codeine   . Indocin [Indomethacin]   . Morphine And Related     REVIEW OF SYSTEMS:  GENERAL: no change in appetite, no fatigue, no weight changes, no fever, chills or weakness RESPIRATORY: no cough, SOB, DOE, wheezing, hemoptysis CARDIAC: no chest pain, edema or palpitations GI: no abdominal pain, diarrhea, constipation, heart burn, nausea or vomiting  PHYSICAL EXAMINATION  GENERAL: no acute distress, obese EYES: conjunctivae normal, sclerae normal, normal eye lids NECK: supple, trachea midline, no neck masses, no thyroid tenderness, no thyromegaly LYMPHATICS: no LAN in the neck, no supraclavicular LAN RESPIRATORY: breathing is even & unlabored, BS CTAB CARDIAC: RRR, no murmur,no extra heart sounds, no edema GI: abdomen soft, normal BS, no masses, no tenderness, no hepatomegaly, no splenomegaly, + abdominal hernia EXTREMITIES:  Able to move 4 extremities; able to walk with walker PSYCHIATRIC: the patient is alert & oriented to person, affect & behavior appropriate  LABS/RADIOLOGY: Labs reviewed: Basic Metabolic Panel:  Recent Labs  09/04/15  NA 138  K 4.5  BUN 16  CREATININE 1.0   Liver Function Tests:  Recent Labs  09/04/15  AST 9*  ALT 4*  ALKPHOS 120   CBC:  Recent Labs  09/04/15  WBC 11.6  HGB 13.5  HCT 41  PLT 310   Lipid Panel:  Recent Labs  09/04/15  HDL 37     06/08/15  urine microalbumin 85.1  urine creatinine 77.8 WBC 11.2 hemoglobin 13.1 hematocrit 40.7 MCV 91.5 platelet 311 05/22/15  chest x-ray shows no acute cardiopulmonary finding 05/20/15  WBC 16.4 hemoglobin 15.4 hematocrit 44.9 MCV 87.9 platelet 333 05/12/15  WBC 11.3 hemoglobin 14.1  hematocrit 41.8 MCV 86.9 lately at 344 hemoglobin A1c 8.3 05/05/15  Urinalysis + for nitrites and many bacteria, wbc 11.3  hgb 13.6  hct 39.9  Platelet 310  NA 142  K 4.0  Glucose 179  BUN  18  Creatinine 0.72  Total bil 0.7  Alk phos 95  SGOT 16  SGPT 16  Total protein 6.2  Albumin 3.2  CA 8.9 vitamin D 25 03/27/15  chest x-ray shows cardiomegaly and no definite acute pulmonary pathology 03/06/15  WBC 7.0 hemoglobin 13.1 hematocrit 38.5 MCV 91.9 platelet count 267 sodium 144 potassium 3.7 glucose 129 BUN 19 creatinine 0.65 total bilirubin 0.3 alkaline phosphatase 61 SGOT 16 SGPT 14 albumin 3.0 total protein 5.8 cholesterol 155 triglycerides 316 HDL 27 LDL 65 01/20/15  right knee x-ray shows no acute osseous abnormality 01/08/15  chest x-ray shows borderline cardiomegaly, no definite focal pneumonia 01/07/15  cholesterol 177 triglycerides 128 HDL 29 LDL 130 TSH 1.628 01/06/15  WBC 11.1 hemoglobin 14.8 hematocrit 43.0 MCV 87.9 sodium 138 potassium 4.8 glucose 181 BUN 17 creatinine 0.85 total bilirubin 0.6 alkaline phosphatase 108 SGOT 13 SGPT <8 total protein 6.7 albumin 3.5 calcium 9.1 hemoglobin A1c 9.7 01/03/15  right hip x-ray shows nicely aligned right hip bipolar prosthesis without evidence of acute osseous pathology    ASSESSMENT/PLAN:  Diabetes mellitus, type II - hgbA1c 8.8;  continue Lantus to 24 units subcutaneous BID, Novolog 8 units SQ BID PRN for CBG > 400 and at bedtime and Glimepiride 4 mg by mouth daily  Hypertension - well-controlled; continue losartan 50 mg by mouth daily and metoprolol 25 mg by mouth twice a day  Hyperlipidemia - continue atorvastatin 10 mg 1 tab by mouth daily; check lipid panel  Oral pain  - discontinue Biotene and start Magic mouthwash swish and spit TID PRN  Protein-calorie malnutrition -  Albumin 3.34continue Procel 1 scoop by mouth twice a day  Bipolar depression - mood is stable; continue Lamictal 100 mg by mouth twice a day, Abilify 2 mg daily and Cymbalta  60 mg daily  GERD - continue Protonix 40 mg by mouth daily   Allergic rhinitis - continue Claritin 10 mg by mouth daily  Parkinson's disease - continue Sinemet 25/100 mg by mouth twice a day  Neuropathy - continue gabapentin. 200 mg by mouth twice a day and Cymbalta 60 mg by mouth daily  OAB - continue oxybutynin 5 mg by mouth TID  Vaginal atrophy - continue Premarin vaginal cream 0.625 mg insert half gm vaginally 2 times/week at bedtime      Goals of care:  long-term care    [facility], [place]

## 2015-11-12 ENCOUNTER — Encounter: Payer: Self-pay | Admitting: Adult Health

## 2015-11-12 ENCOUNTER — Non-Acute Institutional Stay (SKILLED_NURSING_FACILITY): Payer: Medicare Other | Admitting: Adult Health

## 2015-11-12 DIAGNOSIS — N390 Urinary tract infection, site not specified: Secondary | ICD-10-CM | POA: Diagnosis not present

## 2015-11-12 NOTE — Progress Notes (Signed)
Patient ID: Maureen Taylor, female   DOB: 1935-02-17, 81 y.o.   MRN: [id number]    DATE:   11/12/15  Facility:  Nursing Home Location:  [place] Room Number: 782-9 LEVEL OF CARE:  SNF (31)  Chief Complaint  Patient presents with  . Acute Visit:  UTI        HISTORY OF PRESENT ILLNESS:  This is a 81 year old female who was having occasional confusion and daughter requested for UA. Urine culture showed >= 100,000 CFU/ml. No hematuria, dysuria nor fever reported.   PAST MEDICAL HISTORY:   hypertension, depression, GERD, allergic rhinitis, diabetes mellitus, Parkinson's disease and neuropathy  CURRENT MEDICATIONS: Reviewed per MAR/see medication list    Medication List       This list is accurate as of: 11/12/15 11:51 PM.  Always use your most recent med list.               acetaminophen 325 MG tablet  Commonly known as:  TYLENOL  Take 650 mg by mouth every 8 (eight) hours as needed.     ARIPiprazole 2 MG tablet  Commonly known as:  ABILIFY  Take 2 mg by mouth daily.     aspirin 81 MG EC tablet  Take 81 mg by mouth daily. Swallow whole.     atorvastatin 10 MG tablet  Commonly known as:  LIPITOR  Take 10 mg by mouth daily.     carbidopa-levodopa 25-100 MG tablet  Commonly known as:  SINEMET IR  Take 1 tablet by mouth 2 (two) times daily.     conjugated estrogens vaginal cream  Commonly known as:  PREMARIN  Insert 1/2 gram vaginally twice weekly on Wednesday and Saturday at bedtime for vaginal atropy     Cranberry 200 MG Caps  Take 400 mg by mouth daily.     DULoxetine 60 MG capsule  Commonly known as:  CYMBALTA  Take 60 mg by mouth daily.     gabapentin 100 MG capsule  Commonly known as:  NEURONTIN  Take 200 mg by mouth 2 (two) times daily.     glimepiride 4 MG tablet  Commonly known as:  AMARYL  Take 4 mg by mouth daily with breakfast.     insulin aspart 100 UNIT/ML injection  Commonly known as:  novoLOG  If CBG >400,  Give NOVOLOG 8 UNITS SQ BID PRN     insulin glargine 100 UNIT/ML injection  Commonly known as:  LANTUS  24 Units SQ BID (AM and HS)     lamoTRIgine 100 MG tablet  Commonly known as:  LAMICTAL  Take 100 mg by mouth 2 (two) times daily.     loperamide 2 MG capsule  Commonly known as:  IMODIUM  Take 2 tablets = '4mg'$  by mouth initially then 1 tablet ='2mg'$  after each episode of diarrhea. DO NOT EXCEED '16MG'$  IN 24 HOUR PERIOD IF DIARRHEA PERSISTS MORE.     loratadine 10 MG tablet  Commonly known as:  CLARITIN  Take 10 mg by mouth daily.     losartan 50 MG tablet  Commonly known as:  COZAAR  Take 50 mg by mouth daily.     magic mouthwash Soln  Take 10 mLs by mouth 3 (three) times daily as needed for mouth pain.     magnesium hydroxide 400 MG/5ML suspension  Commonly known as:  MILK OF MAGNESIA  45 mL PO q day prn x 2 days per standing orders. (DO NOT  GIVE TO DIALYSIS PATIENTS)     magnesium oxide 400 MG tablet  Commonly known as:  MAG-OX  Take 400 mg by mouth daily.     metoprolol tartrate 25 MG tablet  Commonly known as:  LOPRESSOR  Take 25 mg by mouth 2 (two) times daily. Take 25 mg by mouth twice a day for HTN     oxybutynin 5 MG tablet  Commonly known as:  DITROPAN  Take 5 mg by mouth 3 (three) times daily.     pantoprazole 40 MG tablet  Commonly known as:  PROTONIX  Take 40 mg by mouth daily.     PROBIOTIC ACIDOPHILUS Tabs  Take by mouth. 1 capsule by mouth QD     PROCEL PO  Mix 1 scoop as directed and take by mouth twice daily. For nutritional support/protein. *USE HOUSE STOCK*     SYSTANE BALANCE OP  Apply 1 drop to eye 2 (two) times daily. Both eyes     THEREMS M PO  Take 1 tablet by mouth daily.         Allergies  Allergen Reactions  . Codeine   . Indocin [Indomethacin]   . Morphine And Related     REVIEW OF SYSTEMS:  GENERAL: no change in appetite, no fatigue, no weight changes, no fever, chills or weakness RESPIRATORY: no cough, SOB, DOE,  wheezing, hemoptysis CARDIAC: no chest pain, edema or palpitations GI: no abdominal pain, diarrhea, constipation, heart burn, nausea or vomiting  PHYSICAL EXAMINATION  GENERAL: no acute distress, obese EYES: conjunctivae normal, sclerae normal, normal eye lids NECK: supple, trachea midline, no neck masses, no thyroid tenderness, no thyromegaly LYMPHATICS: no LAN in the neck, no supraclavicular LAN RESPIRATORY: breathing is even & unlabored, BS CTAB CARDIAC: RRR, no murmur,no extra heart sounds, no edema GI: abdomen soft, normal BS, no masses, no tenderness, no hepatomegaly, no splenomegaly, + abdominal hernia EXTREMITIES:  Able to move 4 extremities; able to walk with walker PSYCHIATRIC: the patient is alert & oriented to person, affect & behavior appropriate  LABS/RADIOLOGY: Labs reviewed: Basic Metabolic Panel:  Recent Labs  09/04/15  NA 138  K 4.5  BUN 16  CREATININE 1.0   Liver Function Tests:  Recent Labs  09/04/15  AST 9*  ALT 4*  ALKPHOS 120   CBC:  Recent Labs  09/04/15  WBC 11.6  HGB 13.5  HCT 41  PLT 310   Lipid Panel:  Recent Labs  09/04/15  HDL 37     06/08/15  urine microalbumin 85.1  urine creatinine 77.8 WBC 11.2 hemoglobin 13.1 hematocrit 40.7 MCV 91.5 platelet 311 05/22/15  chest x-ray shows no acute cardiopulmonary finding 05/20/15  WBC 16.4 hemoglobin 15.4 hematocrit 44.9 MCV 87.9 platelet 333 05/12/15  WBC 11.3 hemoglobin 14.1 hematocrit 41.8 MCV 86.9 lately at 344 hemoglobin A1c 8.3 05/05/15  Urinalysis + for nitrites and many bacteria, wbc 11.3  hgb 13.6  hct 39.9  Platelet 310  NA 142  K 4.0  Glucose 179  BUN 18  Creatinine 0.72  Total bil 0.7  Alk phos 95  SGOT 16  SGPT 16  Total protein 6.2  Albumin 3.2  CA 8.9 vitamin D 25 03/27/15  chest x-ray shows cardiomegaly and no definite acute pulmonary pathology 03/06/15  WBC 7.0 hemoglobin 13.1 hematocrit 38.5 MCV 91.9 platelet count 267 sodium 144 potassium 3.7 glucose 129 BUN 19 creatinine 0.65  total bilirubin 0.3 alkaline phosphatase 61 SGOT 16 SGPT 14 albumin 3.0 total protein 5.8 cholesterol 155  triglycerides 316 HDL 27 LDL 65 01/20/15  right knee x-ray shows no acute osseous abnormality 01/08/15  chest x-ray shows borderline cardiomegaly, no definite focal pneumonia 01/07/15  cholesterol 177 triglycerides 128 HDL 29 LDL 130 TSH 1.628 01/06/15  WBC 11.1 hemoglobin 14.8 hematocrit 43.0 MCV 87.9 sodium 138 potassium 4.8 glucose 181 BUN 17 creatinine 0.85 total bilirubin 0.6 alkaline phosphatase 108 SGOT 13 SGPT <8 total protein 6.7 albumin 3.5 calcium 9.1 hemoglobin A1c 9.7 01/03/15  right hip x-ray shows nicely aligned right hip bipolar prosthesis without evidence of acute osseous pathology    ASSESSMENT/PLAN:  UTI - start Bactrim DS 1 tab PO BID X 7 days and Florastor 250 mg 1 capsule PO BID X 10 days.   [facility], NP [employer] [phone]

## 2015-12-01 ENCOUNTER — Encounter: Payer: Self-pay | Admitting: Adult Health

## 2015-12-01 ENCOUNTER — Non-Acute Institutional Stay (SKILLED_NURSING_FACILITY): Payer: Medicare Other | Admitting: Adult Health

## 2015-12-01 DIAGNOSIS — I10 Essential (primary) hypertension: Secondary | ICD-10-CM | POA: Diagnosis not present

## 2015-12-01 DIAGNOSIS — G629 Polyneuropathy, unspecified: Secondary | ICD-10-CM

## 2015-12-01 DIAGNOSIS — N3281 Overactive bladder: Secondary | ICD-10-CM

## 2015-12-01 DIAGNOSIS — E785 Hyperlipidemia, unspecified: Secondary | ICD-10-CM

## 2015-12-01 DIAGNOSIS — J309 Allergic rhinitis, unspecified: Secondary | ICD-10-CM

## 2015-12-01 DIAGNOSIS — G20 Parkinson's disease: Secondary | ICD-10-CM | POA: Diagnosis not present

## 2015-12-01 DIAGNOSIS — K219 Gastro-esophageal reflux disease without esophagitis: Secondary | ICD-10-CM

## 2015-12-01 DIAGNOSIS — G20A1 Parkinson's disease without dyskinesia, without mention of fluctuations: Secondary | ICD-10-CM

## 2015-12-01 DIAGNOSIS — E1142 Type 2 diabetes mellitus with diabetic polyneuropathy: Secondary | ICD-10-CM | POA: Diagnosis not present

## 2015-12-01 DIAGNOSIS — E46 Unspecified protein-calorie malnutrition: Secondary | ICD-10-CM

## 2015-12-01 DIAGNOSIS — F3130 Bipolar disorder, current episode depressed, mild or moderate severity, unspecified: Secondary | ICD-10-CM

## 2015-12-01 DIAGNOSIS — K1379 Other lesions of oral mucosa: Secondary | ICD-10-CM | POA: Diagnosis not present

## 2015-12-01 DIAGNOSIS — Z794 Long term (current) use of insulin: Secondary | ICD-10-CM | POA: Diagnosis not present

## 2015-12-01 NOTE — Progress Notes (Signed)
Patient ID: Maureen Taylor, female   DOB: 09-08-1935, 81 y.o.   MRN: [id number]    DATE:   12/01/15  Facility:  Nursing Home Location:  [place] Room Number: [id number] LEVEL OF CARE:  SNF (31)  Chief Complaint  Patient presents with  . Medical management of chronic illnesses        HISTORY OF PRESENT ILLNESS:  This is a 81 year old female who is being seen for a routine visit. Premarin was recently discontinued due to patient's refusal. Last (09/04/15) hgbA1C 8.8.  Her mood is stable and currently taking Lamictal 100 mg BID, Cymbalta 60 mg daily and Abilify 2 mg daily. No complaints of abdominal pain. She is currently on Protonix for GERD.  PAST MEDICAL HISTORY:   hypertension, depression, GERD, allergic rhinitis, diabetes mellitus, Parkinson's disease and neuropathy  CURRENT MEDICATIONS: Reviewed per MAR/see medication list    Medication List       This list is accurate as of: 12/01/15 11:59 PM.  Always use your most recent med list.               acetaminophen 325 MG tablet  Commonly known as:  TYLENOL  Take 650 mg by mouth every 8 (eight) hours as needed.     Acidophilus Caps capsule  Take 1 capsule by mouth daily. 175 mg capsule     ARIPiprazole 2 MG tablet  Commonly known as:  ABILIFY  Take 2 mg by mouth daily.     aspirin 81 MG EC tablet  Take 81 mg by mouth daily. Swallow whole.     atorvastatin 10 MG tablet  Commonly known as:  LIPITOR  Take 10 mg by mouth daily.     carbidopa-levodopa 25-100 MG tablet  Commonly known as:  SINEMET IR  Take 1 tablet by mouth 2 (two) times daily.     Cranberry 200 MG Caps  Take 400 mg by mouth daily. Take two 200-mg capsules = 400 mg     DULoxetine 60 MG capsule  Commonly known as:  CYMBALTA  Take 60 mg by mouth daily.     gabapentin 100 MG capsule  Commonly known as:  NEURONTIN  Take 200 mg by mouth 2 (two) times daily. Take two 100 mg capsules to = 200 mg PO BID     glimepiride 4 MG  tablet  Commonly known as:  AMARYL  Take 4 mg by mouth daily with breakfast.     insulin aspart 100 UNIT/ML injection  Commonly known as:  novoLOG  If CBG >400, Give NOVOLOG 8 UNITS SQ BID PRN     insulin glargine 100 UNIT/ML injection  Commonly known as:  LANTUS  24 Units SQ BID (AM and HS)     lamoTRIgine 100 MG tablet  Commonly known as:  LAMICTAL  Take 100 mg by mouth 2 (two) times daily.     loperamide 2 MG capsule  Commonly known as:  IMODIUM  Take 2 tablets = 7m by mouth initially then 1 tablet =269mafter each episode of diarrhea. DO NOT EXCEED 16MG IN 24 HOUR PERIOD IF DIARRHEA PERSISTS MORE.     loratadine 10 MG tablet  Commonly known as:  CLARITIN  Take 10 mg by mouth daily.     losartan 50 MG tablet  Commonly known as:  COZAAR  Take 50 mg by mouth daily.     magnesium hydroxide 400 MG/5ML suspension  Commonly known as:  MILK OF MAGNESIA  45 mL PO q day prn x 2 days per standing orders. (DO NOT GIVE TO DIALYSIS PATIENTS)     magnesium oxide 400 MG tablet  Commonly known as:  MAG-OX  Take 400 mg by mouth daily.     metoprolol tartrate 25 MG tablet  Commonly known as:  LOPRESSOR  Take 25 mg by mouth 2 (two) times daily. Take 25 mg by mouth twice a day for HTN     ORAMAGIC PLUS 10 % Susr  Generic drug:  Benzocaine  Use as directed 10 mLs in the mouth or throat 3 (three) times daily as needed (Oral pain).     oxybutynin 5 MG tablet  Commonly known as:  DITROPAN  Take 5 mg by mouth 3 (three) times daily.     pantoprazole 40 MG tablet  Commonly known as:  PROTONIX  Take 40 mg by mouth daily.     PROCEL PO  Take 1 scoop by mouth 2 (two) times daily.     SYSTANE BALANCE OP  Apply 1 drop to eye 2 (two) times daily. Both eyes     THEREMS M PO  Take 1 tablet by mouth daily.         Allergies  Allergen Reactions  . Codeine   . Indocin [Indomethacin]   . Morphine And Related     REVIEW OF SYSTEMS:  GENERAL: no change in appetite, no fatigue, no  weight changes, no fever, chills or weakness RESPIRATORY: no cough, SOB, DOE, wheezing, hemoptysis CARDIAC: no chest pain, edema or palpitations GI: no abdominal pain, diarrhea, constipation, heart burn, nausea or vomiting  PHYSICAL EXAMINATION  GENERAL: no acute distress, obese SKIN:  Skin is warm and dry. EYES: conjunctivae normal, sclerae normal, normal eye lids NECK: supple, trachea midline, no neck masses, no thyroid tenderness, no thyromegaly LYMPHATICS: no LAN in the neck, no supraclavicular LAN RESPIRATORY: breathing is even & unlabored, BS CTAB CARDIAC: RRR, no murmur,no extra heart sounds, no edema GI: abdomen soft, normal BS, no masses, no tenderness, no hepatomegaly, no splenomegaly, + abdominal hernia EXTREMITIES:  Able to move 4 extremities; able to walk with walker PSYCHIATRIC: the patient is alert & oriented to person, affect & behavior appropriate  LABS/RADIOLOGY: Labs reviewed: Basic Metabolic Panel:  Recent Labs  09/04/15  NA 138  K 4.5  BUN 16  CREATININE 1.0   Liver Function Tests:  Recent Labs  09/04/15  AST 9*  ALT 4*  ALKPHOS 120   CBC:  Recent Labs  09/04/15  WBC 11.6  HGB 13.5  HCT 41  PLT 310   Lipid Panel:  Recent Labs  09/04/15  HDL 37     06/08/15  urine microalbumin 85.1  urine creatinine 77.8 WBC 11.2 hemoglobin 13.1 hematocrit 40.7 MCV 91.5 platelet 311 05/22/15  chest x-ray shows no acute cardiopulmonary finding 05/20/15  WBC 16.4 hemoglobin 15.4 hematocrit 44.9 MCV 87.9 platelet 333 05/12/15  WBC 11.3 hemoglobin 14.1 hematocrit 41.8 MCV 86.9 lately at 344 hemoglobin A1c 8.3 05/05/15  Urinalysis + for nitrites and many bacteria, wbc 11.3  hgb 13.6  hct 39.9  Platelet 310  NA 142  K 4.0  Glucose 179  BUN 18  Creatinine 0.72  Total bil 0.7  Alk phos 95  SGOT 16  SGPT 16  Total protein 6.2  Albumin 3.2  CA 8.9 vitamin D 25 03/27/15  chest x-ray shows cardiomegaly and no definite acute pulmonary pathology 03/06/15  WBC 7.0 hemoglobin  13.1 hematocrit 38.5 MCV 91.9  platelet count 267 sodium 144 potassium 3.7 glucose 129 BUN 19 creatinine 0.65 total bilirubin 0.3 alkaline phosphatase 61 SGOT 16 SGPT 14 albumin 3.0 total protein 5.8 cholesterol 155 triglycerides 316 HDL 27 LDL 65 01/20/15  right knee x-ray shows no acute osseous abnormality 01/08/15  chest x-ray shows borderline cardiomegaly, no definite focal pneumonia 01/07/15  cholesterol 177 triglycerides 128 HDL 29 LDL 130 TSH 1.628 01/06/15  WBC 11.1 hemoglobin 14.8 hematocrit 43.0 MCV 87.9 sodium 138 potassium 4.8 glucose 181 BUN 17 creatinine 0.85 total bilirubin 0.6 alkaline phosphatase 108 SGOT 13 SGPT <8 total protein 6.7 albumin 3.5 calcium 9.1 hemoglobin A1c 9.7 01/03/15  right hip x-ray shows nicely aligned right hip bipolar prosthesis without evidence of acute osseous pathology    ASSESSMENT/PLAN:  GERD - continue Protonix 40 mg 1 tab by mouth daily; check CBC  Parkinson's disease - stable; continue Sinemet 25/100 mg by mouth twice a day  Neuropathy - continue gabapentin 200 mg by mouth twice a day and Cymbalta 60 mg daily  Overactive bladder - continue oxybutynin 5 mg by mouth 3 times a day   Bipolar depression - mood this is stable; continue Lamictal 100 mg by mouth twice a day, Abilify 2 mg daily and Cymbalta 60 mg daily  Diabetes mellitus, type II - continue Lantus 24 units subcutaneous twice a day, NovoLog 8 units subcutaneous twice a day when necessary for CBG >400 and the mid right 4 mg by mouth daily; check hemoglobin A1c  Hypertension - controlled; continue losartan 50 mg daily and metoprolol 25 mg twice a day; check CMP  Hyperlipidemia - continue atorvastatin 10 mg 1 tab by mouth daily; check lipid panel  Oral pain - continue Magic mouthwash swish and spit 3 times a day when necessary  Protein calorie malnutrition - continue Procel 1 scoop by mouth twice a day  Allergic rhinitis - continue Claritin 10 mg by mouth daily     Goals of care:   Long-term care   [facility], NP [facility] [phone]

## 2015-12-03 DIAGNOSIS — D649 Anemia, unspecified: Secondary | ICD-10-CM | POA: Diagnosis not present

## 2015-12-03 LAB — CBC AND DIFFERENTIAL
HCT: 40 % (ref 36–46)
HEMOGLOBIN: 13 g/dL (ref 12.0–16.0)
NEUTROS ABS: 6 /uL
PLATELETS: 286 10*3/uL (ref 150–399)
WBC: 10.2 10^3/mL

## 2015-12-03 LAB — HEMOGLOBIN A1C: HEMOGLOBIN A1C: 9.7

## 2015-12-03 LAB — LIPID PANEL
CHOLESTEROL: 135 mg/dL (ref 0–200)
HDL: 32 mg/dL — AB (ref 35–70)
LDL CALC: 77 mg/dL
TRIGLYCERIDES: 133 mg/dL (ref 40–160)

## 2015-12-03 LAB — HEPATIC FUNCTION PANEL
ALK PHOS: 120 U/L (ref 25–125)
ALT: 5 U/L — AB (ref 7–35)
AST: 13 U/L (ref 13–35)
BILIRUBIN, TOTAL: 0.5 mg/dL

## 2015-12-03 LAB — BASIC METABOLIC PANEL
BUN: 21 mg/dL (ref 4–21)
CREATININE: 1 mg/dL (ref 0.5–1.1)
Glucose: 163 mg/dL
Potassium: 4.3 mmol/L (ref 3.4–5.3)
Sodium: 141 mmol/L (ref 137–147)

## 2015-12-04 ENCOUNTER — Non-Acute Institutional Stay (SKILLED_NURSING_FACILITY): Payer: Medicare Other | Admitting: Adult Health

## 2015-12-04 ENCOUNTER — Encounter: Payer: Self-pay | Admitting: Adult Health

## 2015-12-04 DIAGNOSIS — Z794 Long term (current) use of insulin: Secondary | ICD-10-CM

## 2015-12-04 DIAGNOSIS — E1142 Type 2 diabetes mellitus with diabetic polyneuropathy: Secondary | ICD-10-CM | POA: Diagnosis not present

## 2015-12-04 NOTE — Progress Notes (Signed)
Patient ID: Maureen Taylor, female   DOB: Dec 21, 1934, 81 y.o.   MRN: [id number]   DATE:   12/04/15  Facility:  Nursing Home Location:  [facility] Room Number: 905-1 LEVEL OF CARE:  SNF (31)  Chief Complaint  Patient presents with  . Acute Visit:  Diabetes Management        HISTORY OF PRESENT ILLNESS:  This is a 81 year old female who is being seen for elevated CBGs. Review of CBG - [id number]. She is currently taking Lantus and Novolog for her Diabetes Mellitus. Latest hgbA1c is 9.7.   PAST MEDICAL HISTORY:   hypertension, depression, GERD, allergic rhinitis, diabetes mellitus, Parkinson's disease and neuropathy  CURRENT MEDICATIONS: Reviewed per MAR/see medication list    Medication List       This list is accurate as of: 12/04/15 11:59 PM.  Always use your most recent med list.               acetaminophen 325 MG tablet  Commonly known as:  TYLENOL  Take 650 mg by mouth every 8 (eight) hours as needed.     Acidophilus Caps capsule  Take 1 capsule by mouth daily. 175 mg capsule     ARIPiprazole 2 MG tablet  Commonly known as:  ABILIFY  Take 2 mg by mouth daily.     aspirin 81 MG EC tablet  Take 81 mg by mouth daily. Swallow whole.     atorvastatin 10 MG tablet  Commonly known as:  LIPITOR  Take 10 mg by mouth daily.     carbidopa-levodopa 25-100 MG tablet  Commonly known as:  SINEMET IR  Take 1 tablet by mouth 2 (two) times daily.     Cranberry 200 MG Caps  Take 400 mg by mouth daily. Take two 200-mg capsules = 400 mg     DULoxetine 60 MG capsule  Commonly known as:  CYMBALTA  Take 60 mg by mouth daily.     gabapentin 100 MG capsule  Commonly known as:  NEURONTIN  Take 200 mg by mouth 2 (two) times daily. Take two 100 mg capsules to = 200 mg PO BID     glimepiride 4 MG tablet  Commonly known as:  AMARYL  Take 4 mg by mouth daily with breakfast.     insulin aspart 100 UNIT/ML injection  Commonly  known as:  novoLOG  If CBG >400, Give NOVOLOG 8 UNITS SQ BID PRN     insulin glargine 100 UNIT/ML injection  Commonly known as:  LANTUS  30 Units SQ BID (AM and HS)     lamoTRIgine 100 MG tablet  Commonly known as:  LAMICTAL  Take 100 mg by mouth 2 (two) times daily.     loperamide 2 MG capsule  Commonly known as:  IMODIUM  Take 2 tablets = 4mg  by mouth initially then 1 tablet =2mg  after each episode of diarrhea. DO NOT EXCEED 16MG  IN 24 HOUR PERIOD IF DIARRHEA PERSISTS MORE.     loratadine 10 MG tablet  Commonly known as:  CLARITIN  Take 10 mg by mouth daily.     losartan 50 MG tablet  Commonly known as:  COZAAR  Take 50 mg by mouth daily.     magnesium hydroxide 400 MG/5ML suspension  Commonly known as:  MILK OF MAGNESIA  45 mL PO q day prn x 2 days per standing orders. (DO NOT GIVE TO DIALYSIS PATIENTS)     magnesium oxide 400 MG  tablet  Commonly known as:  MAG-OX  Take 400 mg by mouth daily.     metoprolol tartrate 25 MG tablet  Commonly known as:  LOPRESSOR  Take 25 mg by mouth 2 (two) times daily. Take 25 mg by mouth twice a day for HTN     ORAMAGIC PLUS 10 % Susr  Generic drug:  Benzocaine  Use as directed 10 mLs in the mouth or throat 3 (three) times daily as needed (Oral pain).     oxybutynin 5 MG tablet  Commonly known as:  DITROPAN  Take 5 mg by mouth 3 (three) times daily.     pantoprazole 40 MG tablet  Commonly known as:  PROTONIX  Take 40 mg by mouth daily.     PROCEL PO  Take 1 scoop by mouth 2 (two) times daily.     SYSTANE BALANCE OP  Apply 1 drop to eye 2 (two) times daily. Both eyes     THEREMS M PO  Take 1 tablet by mouth daily.         Allergies  Allergen Reactions  . Codeine   . Indocin [Indomethacin]   . Morphine And Related     REVIEW OF SYSTEMS:  GENERAL: no change in appetite, no fatigue, no weight changes, no fever, chills or weakness RESPIRATORY: no cough, SOB, DOE, wheezing, hemoptysis CARDIAC: no chest pain, edema or  palpitations GI: no abdominal pain, diarrhea, constipation, heart burn, nausea or vomiting  PHYSICAL EXAMINATION  GENERAL: no acute distress, obese SKIN:  Skin is warm and dry. EYES: conjunctivae normal, sclerae normal, normal eye lids NECK: supple, trachea midline, no neck masses, no thyroid tenderness, no thyromegaly LYMPHATICS: no LAN in the neck, no supraclavicular LAN RESPIRATORY: breathing is even & unlabored, BS CTAB CARDIAC: RRR, no murmur,no extra heart sounds, no edema GI: abdomen soft, normal BS, no masses, no tenderness, no hepatomegaly, no splenomegaly, + abdominal hernia EXTREMITIES:  Able to move 4 extremities; able to walk with walker PSYCHIATRIC: the patient is alert & oriented to person, affect & behavior appropriate  LABS/RADIOLOGY: Labs reviewed: Basic Metabolic Panel:  Recent Labs  16/10/96    NA 141    K 4.3    BUN 21    CREATININE 1.0     Liver Function Tests:  Recent Labs  09/04/15 12/03/15  AST 9* 13  ALT 4* 5*  ALKPHOS 120 120   CBC:  Recent Labs  09/04/15 12/03/15   WBC 11.6 10.2   NEUTROABS  --  6   HGB 13.5 13.0   HCT 41 40   PLT 310 286    Lipid Panel:  Recent Labs  09/04/15 12/03/15  HDL 37 32*       ASSESSMENT/PLAN:   Diabetes mellitus, type II - increase Lantus to 30  units subcutaneous twice a day and continue NovoLog 8 units subcutaneous twice a day when necessary for CBG >400 and Glimiperide  4 mg by mouth daily   Kenard Gower, NP [employer] [phone]

## 2015-12-31 ENCOUNTER — Non-Acute Institutional Stay (SKILLED_NURSING_FACILITY): Payer: Medicare Other | Admitting: Adult Health

## 2015-12-31 ENCOUNTER — Encounter: Payer: Self-pay | Admitting: Adult Health

## 2015-12-31 DIAGNOSIS — G20 Parkinson's disease: Secondary | ICD-10-CM | POA: Diagnosis not present

## 2015-12-31 DIAGNOSIS — Z794 Long term (current) use of insulin: Secondary | ICD-10-CM

## 2015-12-31 DIAGNOSIS — E1142 Type 2 diabetes mellitus with diabetic polyneuropathy: Secondary | ICD-10-CM

## 2015-12-31 DIAGNOSIS — J309 Allergic rhinitis, unspecified: Secondary | ICD-10-CM

## 2015-12-31 DIAGNOSIS — K1379 Other lesions of oral mucosa: Secondary | ICD-10-CM

## 2015-12-31 DIAGNOSIS — G629 Polyneuropathy, unspecified: Secondary | ICD-10-CM

## 2015-12-31 DIAGNOSIS — N3281 Overactive bladder: Secondary | ICD-10-CM | POA: Diagnosis not present

## 2015-12-31 DIAGNOSIS — K219 Gastro-esophageal reflux disease without esophagitis: Secondary | ICD-10-CM | POA: Diagnosis not present

## 2015-12-31 DIAGNOSIS — I10 Essential (primary) hypertension: Secondary | ICD-10-CM

## 2015-12-31 DIAGNOSIS — F3130 Bipolar disorder, current episode depressed, mild or moderate severity, unspecified: Secondary | ICD-10-CM | POA: Diagnosis not present

## 2015-12-31 DIAGNOSIS — E785 Hyperlipidemia, unspecified: Secondary | ICD-10-CM

## 2015-12-31 DIAGNOSIS — E46 Unspecified protein-calorie malnutrition: Secondary | ICD-10-CM

## 2015-12-31 NOTE — Progress Notes (Signed)
Patient ID: Maureen Taylor, female   DOB: 10-Sep-1935, 81 y.o.   MRN: [id number]   DATE:    12/31/15  Facility:  Nursing Home Location:  [facility] Room Number: [phone] LEVEL OF CARE:  SNF (31)  Chief Complaint  Patient presents with  . Medical management of chronic illnesses        HISTORY OF PRESENT ILLNESS:  This is a 81 year old female who is being seen for a routine visit. She is a [facility] [address] [facility]. Lantus dosage was recently increased. Latest hgbA1c 9.7. No complaints of abdominal pain and continues to take Protonix for GERD.  PAST MEDICAL HISTORY:   hypertension, depression, GERD, allergic rhinitis, diabetes mellitus, Parkinson's disease and neuropathy  CURRENT MEDICATIONS: Reviewed per MAR/see medication list    Medication List       This list is accurate as of: 12/31/15 11:59 PM.  Always use your most recent med list.               acetaminophen 325 MG tablet  Commonly known as:  TYLENOL  Take 650 mg by mouth every 8 (eight) hours as needed.     Acidophilus Caps capsule  Take 1 capsule by mouth daily. 175 mg capsule     ARIPiprazole 2 MG tablet  Commonly known as:  ABILIFY  Take 2 mg by mouth daily.     aspirin 81 MG EC tablet  Take 81 mg by mouth daily. Swallow whole.     atorvastatin 10 MG tablet  Commonly known as:  LIPITOR  Take 10 mg by mouth daily.     carbidopa-levodopa 25-100 MG tablet  Commonly known as:  SINEMET IR  Take 1 tablet by mouth 2 (two) times daily.     Cranberry 200 MG Caps  Take 400 mg by mouth daily. Take two 200-mg capsules = 400 mg     DULoxetine 60 MG capsule  Commonly known as:  CYMBALTA  Take 60 mg by mouth daily.     gabapentin 100 MG capsule  Commonly known as:  NEURONTIN  Take 200 mg by mouth 2 (two) times daily. Take two 100 mg capsules to = 200 mg PO BID     glimepiride 4 MG tablet  Commonly known as:  AMARYL  Take 4 mg by mouth daily with breakfast.     insulin  aspart 100 UNIT/ML injection  Commonly known as:  novoLOG  If CBG >400, Give NOVOLOG 8 UNITS SQ BID PRN     insulin glargine 100 UNIT/ML injection  Commonly known as:  LANTUS  30 Units SQ BID (AM and HS)     lamoTRIgine 100 MG tablet  Commonly known as:  LAMICTAL  Take 100 mg by mouth 2 (two) times daily.     loperamide 2 MG capsule  Commonly known as:  IMODIUM  Take 2 tablets = 4mg  by mouth initially then 1 tablet =2mg  after each episode of diarrhea. DO NOT EXCEED 16MG  IN 24 HOUR PERIOD IF DIARRHEA PERSISTS MORE.     loratadine 10 MG tablet  Commonly known as:  CLARITIN  Take 10 mg by mouth daily.     losartan 50 MG tablet  Commonly known as:  COZAAR  Take 50 mg by mouth daily.     magnesium hydroxide 400 MG/5ML suspension  Commonly known as:  MILK OF MAGNESIA  45 mL PO q day prn x 2 days per standing orders. (DO NOT GIVE TO DIALYSIS  PATIENTS)     magnesium oxide 400 MG tablet  Commonly known as:  MAG-OX  Take 400 mg by mouth daily.     metoprolol tartrate 25 MG tablet  Commonly known as:  LOPRESSOR  Take 25 mg by mouth 2 (two) times daily. Take 25 mg by mouth twice a day for HTN     ORAMAGIC PLUS 10 % Susr  Generic drug:  Benzocaine  Use as directed 10 mLs in the mouth or throat 3 (three) times daily as needed (Oral pain).     oxybutynin 5 MG tablet  Commonly known as:  DITROPAN  Take 5 mg by mouth 3 (three) times daily.     pantoprazole 40 MG tablet  Commonly known as:  PROTONIX  Take 40 mg by mouth daily.     PROCEL PO  Take 1 scoop by mouth 2 (two) times daily.     SYSTANE BALANCE OP  Apply 1 drop to eye 2 (two) times daily. Both eyes     THEREMS M PO  Take 1 tablet by mouth daily.         Allergies  Allergen Reactions  . Codeine   . Indocin [Indomethacin]   . Morphine And Related     REVIEW OF SYSTEMS:  GENERAL: no change in appetite, no fatigue, no weight changes, no fever, chills or weakness RESPIRATORY: no cough, SOB, DOE, wheezing,  hemoptysis CARDIAC: no chest pain, edema or palpitations GI: no abdominal pain, diarrhea, constipation, heart burn, nausea or vomiting  PHYSICAL EXAMINATION  GENERAL: no acute distress, obese SKIN:  Skin is warm and dry. EYES: conjunctivae normal, sclerae normal, normal eye lids NECK: supple, trachea midline, no neck masses, no thyroid tenderness, no thyromegaly LYMPHATICS: no LAN in the neck, no supraclavicular LAN RESPIRATORY: breathing is even & unlabored, BS CTAB CARDIAC: RRR, no murmur,no extra heart sounds, no edema GI: abdomen soft, normal BS, no masses, no tenderness, no hepatomegaly, no splenomegaly, + abdominal hernia EXTREMITIES:  Able to move 4 extremities; able to walk with walker PSYCHIATRIC: the patient is alert & oriented to person, affect & behavior appropriate  LABS/RADIOLOGY: Labs reviewed: Basic Metabolic Panel:  Recent Labs  84/13/24    NA 141    K 4.3    BUN 21    CREATININE 1.0     Liver Function Tests:  Recent Labs  09/04/15 12/03/15  AST 9* 13  ALT 4* 5*  ALKPHOS 120 120   CBC:  Recent Labs  09/04/15 12/03/15   WBC 11.6 10.2   NEUTROABS  --  6   HGB 13.5 13.0   HCT 41 40   PLT 310 286    Lipid Panel:  Recent Labs  09/04/15 12/03/15  HDL 37 32*     ASSESSMENT/PLAN:  Diabetes mellitus, type II - recently increased Lantus to 30  units subcutaneous twice a day, continue NovoLog 8 units subcutaneous twice a day when necessary for CBG >400 and Glimiperide  4 mg by mouth daily Lab Results  Component Value Date   HGBA1C 9.7 12/03/2015    GERD - continue Protonix 40 mg 1 tab by mouth daily  Parkinson's disease - stable; continue Sinemet 25/100 mg by mouth twice a day  Neuropathy - continue gabapentin 200 mg by mouth twice a day and Cymbalta 60 mg daily  Overactive bladder - continue oxybutynin 5 mg by mouth 3 times a day   Bipolar depression - mood is stable; continue Lamictal 100 mg by mouth twice a  day, Abilify 2 mg daily and  Cymbalta 60 mg daily  Hypertension - controlled; continue losartan 50 mg daily and metoprolol 25 mg twice a day; check CMP  Hyperlipidemia - continue atorvastatin 10 mg 1 tab by mouth daily Lab Results  Component Value Date   CHOL 135 12/03/2015   HDL 32* 12/03/2015   LDLCALC 77 12/03/2015   TRIG 133 12/03/2015    Oral pain - continue Magic mouthwash swish and spit 3 times a day when necessary  Protein calorie malnutrition - continue Procel 1 scoop by mouth twice a day  Allergic rhinitis - continue Claritin 10 mg by mouth daily     Goals of care:  Long-term care   Kenard Gower, NP [facility] [phone]

## 2016-01-20 DIAGNOSIS — Z79899 Other long term (current) drug therapy: Secondary | ICD-10-CM | POA: Diagnosis not present

## 2016-01-20 DIAGNOSIS — D649 Anemia, unspecified: Secondary | ICD-10-CM | POA: Diagnosis not present

## 2016-01-20 LAB — CBC AND DIFFERENTIAL
HCT: 44 % (ref 36–46)
Hemoglobin: 14.1 g/dL (ref 12.0–16.0)
Neutrophils Absolute: 6 /uL
Platelets: 299 10*3/uL (ref 150–399)
WBC: 10.6 10*3/mL

## 2016-01-20 LAB — BASIC METABOLIC PANEL
BUN: 49 mg/dL — AB (ref 4–21)
Creatinine: 2 mg/dL — AB (ref 0.5–1.1)
GLUCOSE: 88 mg/dL
POTASSIUM: 3.8 mmol/L (ref 3.4–5.3)
Sodium: 145 mmol/L (ref 137–147)

## 2016-01-28 DIAGNOSIS — I10 Essential (primary) hypertension: Secondary | ICD-10-CM | POA: Diagnosis not present

## 2016-01-28 LAB — BASIC METABOLIC PANEL
BUN: 15 mg/dL (ref 4–21)
CREATININE: 1 mg/dL (ref 0.5–1.1)
Glucose: 117 mg/dL
Potassium: 4.5 mmol/L (ref 3.4–5.3)
Sodium: 147 mmol/L (ref 137–147)

## 2016-02-09 ENCOUNTER — Encounter: Payer: Self-pay | Admitting: Adult Health

## 2016-02-09 ENCOUNTER — Non-Acute Institutional Stay (SKILLED_NURSING_FACILITY): Payer: Medicare Other | Admitting: Adult Health

## 2016-02-09 DIAGNOSIS — G20 Parkinson's disease: Secondary | ICD-10-CM

## 2016-02-09 DIAGNOSIS — E46 Unspecified protein-calorie malnutrition: Secondary | ICD-10-CM | POA: Diagnosis not present

## 2016-02-09 DIAGNOSIS — I10 Essential (primary) hypertension: Secondary | ICD-10-CM | POA: Diagnosis not present

## 2016-02-09 DIAGNOSIS — J309 Allergic rhinitis, unspecified: Secondary | ICD-10-CM | POA: Diagnosis not present

## 2016-02-09 DIAGNOSIS — F3130 Bipolar disorder, current episode depressed, mild or moderate severity, unspecified: Secondary | ICD-10-CM | POA: Diagnosis not present

## 2016-02-09 DIAGNOSIS — N3281 Overactive bladder: Secondary | ICD-10-CM | POA: Diagnosis not present

## 2016-02-09 DIAGNOSIS — G629 Polyneuropathy, unspecified: Secondary | ICD-10-CM

## 2016-02-09 DIAGNOSIS — Z794 Long term (current) use of insulin: Secondary | ICD-10-CM

## 2016-02-09 DIAGNOSIS — E785 Hyperlipidemia, unspecified: Secondary | ICD-10-CM

## 2016-02-09 DIAGNOSIS — E1142 Type 2 diabetes mellitus with diabetic polyneuropathy: Secondary | ICD-10-CM

## 2016-02-09 DIAGNOSIS — K219 Gastro-esophageal reflux disease without esophagitis: Secondary | ICD-10-CM

## 2016-02-09 NOTE — Progress Notes (Signed)
Patient ID: Maureen Taylor, female   DOB: 04-27-1935, 81 y.o.   MRN: [id number]    DATE:    02/09/16  MRN:  [id number]  BIRTHDAY: [birth year]  Facility:  Nursing Home Location:  [facility]  Nursing Home Room Number: 905-1  LEVEL OF CARE:  SNF (31)  Contact Information    Name Relation Home Work [place] L Daughter [phone]     Wallene Huh Daughter [phone]         Code Status History    This patient does not have a recorded code status. Please follow your organizational policy for patients in this situation.       Chief Complaint  Patient presents with  . Medical Management of Chronic Issues    HISTORY OF PRESENT ILLNESS:  This is a 81 year old female who is being seen for a routine visit. She is a long-term resident of [address]. She has Bipolar Disorder and her mood has been stable. She is currently on Lamictal, Abilify and Cymbalta. BP review - 114/61, 133/68, 140/71, 124/69 and 114/71. She currently takes Losartan and Metoprolol.  PAST MEDICAL HISTORY:  Past Medical History  Diagnosis Date  . Vitamin deficiency   . Rosacea   . Candidiasis   . Magnesium deficiency   . History of falling   . Mixed incontinence   . Ascorbic acid deficiency   . Slow transit constipation   . Pneumonia   . GERD (gastroesophageal reflux disease)   . Major depressive disorder (HCC)   . Allergic rhinitis   . Charcot's joint   . Muscle weakness   . Obesity   . OAB (overactive bladder)   . Vaginal atrophy   . Parkinson disease (HCC)   . Neuropathy (HCC)   . Bipolar affective disorder (HCC)   . Protein calorie malnutrition (HCC)   . Oral pain   . Type 2 diabetes mellitus with diabetic polyneuropathy, with long-term current use of insulin (HCC)   . HLD (hyperlipidemia)   . Urinary tract infection without hematuria   . HTN (hypertension)      CURRENT MEDICATIONS: Reviewed  Patient's Medications  New Prescriptions   No  medications on file  Previous Medications   ACETAMINOPHEN (TYLENOL) 325 MG TABLET    Take 650 mg by mouth every 8 (eight) hours as needed.   ARIPIPRAZOLE (ABILIFY) 2 MG TABLET    Take 2 mg by mouth daily.   ASPIRIN 81 MG EC TABLET    Take 81 mg by mouth daily. Swallow whole.   ATORVASTATIN (LIPITOR) 10 MG TABLET    Take 10 mg by mouth daily.   CARBIDOPA-LEVODOPA (SINEMET IR) 25-100 MG TABLET    Take 1 tablet by mouth 2 (two) times daily.   CRANBERRY 200 MG CAPS    Take 400 mg by mouth daily. Take two 200-mg capsules = 400 mg   DULOXETINE (CYMBALTA) 60 MG CAPSULE    Take 60 mg by mouth daily.   GABAPENTIN (NEURONTIN) 100 MG CAPSULE    Take 200 mg by mouth 2 (two) times daily. Take two 100 mg capsules to = 200 mg PO BID   GLIMEPIRIDE (AMARYL) 4 MG TABLET    Take 4 mg by mouth daily with breakfast.   INSULIN ASPART (NOVOLOG) 100 UNIT/ML INJECTION    If CBG >400, Give NOVOLOG 8 UNITS SQ BID PRN   INSULIN GLARGINE (LANTUS) 100 UNIT/ML INJECTION    30 Units SQ BID (AM and HS)  LACTOBACILLUS (ACIDOPHILUS) CAPS CAPSULE    Take 1 capsule by mouth daily. 175 mg capsule   LAMOTRIGINE (LAMICTAL) 100 MG TABLET    Take 100 mg by mouth 2 (two) times daily.   LOPERAMIDE (IMODIUM) 2 MG CAPSULE    Take 2 tablets =  by mouth initially then 1 tablet =2mg  after each episode of diarrhea. DO NOT EXCEED  IN 24 HOUR PERIOD IF DIARRHEA PERSISTS MORE.   LORATADINE (CLARITIN) 10 MG TABLET    Take 10 mg by mouth daily.   LOSARTAN (COZAAR) 50 MG TABLET    Take 50 mg by mouth daily.   MAGNESIUM OXIDE (MAG-OX) 400 MG TABLET    Take 400 mg by mouth daily.   METOPROLOL TARTRATE (LOPRESSOR) 25 MG TABLET    Take 25 mg by mouth 2 (two) times daily. Take 25 mg by mouth twice a day for HTN   MULTIPLE VITAMINS-MINERALS (THEREMS M PO)    Take 1 tablet by mouth daily.   OXYBUTYNIN (DITROPAN) 5 MG TABLET    Take 5 mg by mouth 3 (three) times daily.   PANTOPRAZOLE (PROTONIX) 40 MG TABLET    Take 40 mg by mouth daily.   PROPYLENE  GLYCOL (SYSTANE BALANCE OP)    Apply 1 drop to eye 2 (two) times daily. Both eyes   PROTEIN (PROCEL PO)    Take 1 scoop by mouth 2 (two) times daily.   Modified Medications   No medications on file  Discontinued Medications   BENZOCAINE (ORAMAGIC PLUS) 10 % SUSR    Use as directed 10 mLs in the mouth or throat 3 (three) times daily as needed (Oral pain).   MAGNESIUM HYDROXIDE (MILK OF MAGNESIA) 400 MG/5ML SUSPENSION    45 mL PO q day prn x 2 days per standing orders. (DO NOT GIVE TO DIALYSIS PATIENTS)     Allergies  Allergen Reactions  . Codeine   . Indocin [Indomethacin]   . Morphine And Related      REVIEW OF SYSTEMS:  GENERAL: no change in appetite, no fatigue, no weight changes, no fever, chills or weakness EYES: Denies change in vision, dry eyes, eye pain, itching or discharge EARS: Denies change in hearing, ringing in ears, or earache NOSE: Denies nasal congestion or epistaxis MOUTH and THROAT: Denies oral discomfort, gingival pain or bleeding, pain from teeth or hoarseness   RESPIRATORY: no cough, SOB, DOE, wheezing, hemoptysis CARDIAC: no chest pain, edema or palpitations GI: no abdominal pain, diarrhea, constipation, heart burn, nausea or vomiting GU: Denies dysuria, frequency, hematuria, incontinence, or discharge PSYCHIATRIC: Denies feeling of depression or anxiety. No report of hallucinations, insomnia, paranoia, or agitation    PHYSICAL EXAMINATION  GENERAL APPEARANCE: Well nourished. In no acute distress. Obese SKIN:  Skin is warm and dry.  HEAD: Normal in size and contour. No evidence of trauma EYES: Lids open and close normally. No blepharitis, entropion or ectropion. PERRL. Conjunctivae are clear and sclerae are white. Lenses are without opacity EARS: Pinnae are normal. Patient hears normal voice tunes of the examiner MOUTH and THROAT: Lips are without lesions. Oral mucosa is moist and without lesions. Tongue is normal in shape, size, and color and without  lesions NECK: supple, trachea midline, no neck masses, no thyroid tenderness, no thyromegaly LYMPHATICS: no LAN in the neck, no supraclavicular LAN RESPIRATORY: breathing is even & unlabored, BS CTAB CARDIAC: RRR, no murmur,no extra heart sounds, no edema GI: abdomen soft, normal BS, no masses, no tenderness, no hepatomegaly, no splenomegaly  EXTREMITIES:  Able to move X 4 extremiteis PSYCHIATRIC: Alert to person, disoriented to time and place. Affect and behavior are appropriate  LABS/RADIOLOGY: Labs reviewed: Basic Metabolic Panel:  Recent Labs  40/98/1102/06/12 01/20/16 01/28/16  NA 141 145 147  K 4.3 3.8 4.5  BUN 21 49* 15  CREATININE 1.0 2.0* 1.0   Liver Function Tests:  Recent Labs  09/04/15 12/03/15  AST 9* 13  ALT 4* 5*  ALKPHOS 120 120   CBC:  Recent Labs  09/04/15 12/03/15 01/20/16  WBC 11.6 10.2 10.6  NEUTROABS  --  6 6  HGB 13.5 13.0 14.1  HCT 41 40 44  PLT 310 286 299   Lipid Panel:  Recent Labs  09/04/15 12/03/15  HDL 37 32*    ASSESSMENT/PLAN:  Overactive bladder - continue oxybutynin 5 mg by mouth 3 times a day  Bipolar depression - mood is stable; continue Lamictal 100 mg by mouth twice a day, Abilify 2 mg daily and Cymbalta 60 mg daily  Diabetes mellitus, type II -continue Lantus to 30  units subcutaneous twice a day,  NovoLog 8 units subcutaneous twice a day when necessary for CBG >400 and Glimiperide  4 mg by mouth daily Lab Results  Component Value Date   HGBA1C 9.7 12/03/2015    GERD - continue Protonix 40 mg 1 tab by mouth daily  Parkinson's disease - stable; continue Sinemet 25/100 mg by mouth twice a day  Neuropathy - continue gabapentin 200 mg by mouth twice a day and Cymbalta 60 mg daily  Hypertension - controlled; continue losartan 50 mg daily and metoprolol 25 mg twice a day  Hyperlipidemia - continue atorvastatin 10 mg 1 tab by mouth daily Lab Results  Component Value Date   CHOL 135 12/03/2015   HDL 32* 12/03/2015   LDLCALC  77 12/03/2015   TRIG 133 12/03/2015       Protein calorie malnutrition - continue Procel 1 scoop by mouth twice a day  Allergic rhinitis - continue Claritin 10 mg by mouth daily     Goals of care:  Long-term care     Kenard GowerMonina Medina-Vargas, NP [facility] [phone]

## 2016-02-10 DIAGNOSIS — B351 Tinea unguium: Secondary | ICD-10-CM | POA: Diagnosis not present

## 2016-02-10 DIAGNOSIS — G20 Parkinson's disease: Secondary | ICD-10-CM | POA: Diagnosis not present

## 2016-02-10 DIAGNOSIS — M79671 Pain in right foot: Secondary | ICD-10-CM | POA: Diagnosis not present

## 2016-02-10 DIAGNOSIS — E1140 Type 2 diabetes mellitus with diabetic neuropathy, unspecified: Secondary | ICD-10-CM | POA: Diagnosis not present

## 2016-02-22 DIAGNOSIS — N390 Urinary tract infection, site not specified: Secondary | ICD-10-CM | POA: Diagnosis not present

## 2016-02-24 ENCOUNTER — Non-Acute Institutional Stay (SKILLED_NURSING_FACILITY): Payer: Medicare Other | Admitting: Adult Health

## 2016-02-24 ENCOUNTER — Encounter: Payer: Self-pay | Admitting: Adult Health

## 2016-02-24 DIAGNOSIS — N390 Urinary tract infection, site not specified: Secondary | ICD-10-CM | POA: Diagnosis not present

## 2016-02-24 NOTE — Progress Notes (Signed)
Patient ID: Maureen Taylor, female   DOB: 06/15/1935, 81 y.o.   MRN: [id number]    DATE:    02/24/16  MRN:  [id number]  BIRTHDAY: 06/15/1935  Facility:  Nursing Home Location:  [facility]  Nursing Home Room Number: 905-1  LEVEL OF CARE:  SNF (31)  Contact Information    Name Relation Home Work [employer]   Kerr,Jennifer L Daughter [phone]     Wallene Huhlexander,[name] Daughter [phone]         Code Status History    This patient does not have a recorded code status. Please follow your organizational policy for patients in this situation.       Chief Complaint  Patient presents with  . Acute Visit    ESBL UTI    HISTORY OF PRESENT ILLNESS:  This is a 81 year old female who complains of dysuria. Urine culture showed > 100,000 CFU/ml E. Coli/ESBL. No fever nor hematuria has been noted.  PAST MEDICAL HISTORY:  Past Medical History  Diagnosis Date  . Vitamin deficiency   . Rosacea   . Candidiasis   . Magnesium deficiency   . History of falling   . Mixed incontinence   . Ascorbic acid deficiency   . Slow transit constipation   . Pneumonia   . GERD (gastroesophageal reflux disease)   . Major depressive disorder (HCC)   . Allergic rhinitis   . Charcot's joint   . Muscle weakness   . Obesity   . OAB (overactive bladder)   . Vaginal atrophy   . Parkinson disease (HCC)   . Neuropathy (HCC)   . Bipolar affective disorder (HCC)   . Protein calorie malnutrition (HCC)   . Oral pain   . Type 2 diabetes mellitus with diabetic polyneuropathy, with long-term current use of insulin (HCC)   . HLD (hyperlipidemia)   . Urinary tract infection without hematuria   . HTN (hypertension)   . Dysuria   . Neuropathic pain   . Chronic constipation      CURRENT MEDICATIONS: Reviewed  Patient's Medications  New Prescriptions   CONJUGATED ESTROGENS (PREMARIN) VAGINAL CREAM    Place 1 Applicatorful vaginally daily. Apply 0.5mg  (pea-sized amount)  just inside the  vaginal introitus with a finger-tip every night for two weeks and then Monday, Wednesday and Friday nights.  Previous Medications   ACETAMINOPHEN (TYLENOL) 325 MG TABLET    Take 650 mg by mouth as needed for mild pain.    ARIPIPRAZOLE (ABILIFY) 2 MG TABLET    Take 2 mg by mouth daily.   ASPIRIN 81 MG EC TABLET    Take 81 mg by mouth daily. Swallow whole.   ATORVASTATIN (LIPITOR) 10 MG TABLET    Take 10 mg by mouth daily.   CARBIDOPA-LEVODOPA (SINEMET IR) 25-100 MG TABLET    Take 1 tablet by mouth 2 (two) times daily.   CRANBERRY 200 MG CAPS    Take 400 mg by mouth daily. Take two 200-mg capsules = 400 mg   DOCUSATE SODIUM (COLACE) 100 MG CAPSULE    Take 100 mg by mouth 2 (two) times daily.   DULOXETINE (CYMBALTA) 60 MG CAPSULE    Take 60 mg by mouth daily.   GABAPENTIN (NEURONTIN) 100 MG CAPSULE    Take 200 mg by mouth 2 (two) times daily. Take two 100 mg capsules to = 200 mg PO BID   GLIMEPIRIDE (AMARYL) 4 MG TABLET    Take 4 mg by mouth daily with breakfast.  INSULIN ASPART (NOVOLOG) 100 UNIT/ML INJECTION    Inject 8 Units into the skin 2 (two) times daily. If CBG >400, Give NOVOLOG 8 UNITS SQ BID PRN   INSULIN GLARGINE (LANTUS) 100 UNIT/ML INJECTION    Inject 30 Units into the skin 2 (two) times daily. QAM and QHS   LACTOBACILLUS (ACIDOPHILUS) CAPS CAPSULE    Take 1 capsule by mouth daily. 175 mg capsule   LAMOTRIGINE (LAMICTAL) 100 MG TABLET    Take 100 mg by mouth 2 (two) times daily.   LOPERAMIDE (IMODIUM) 2 MG CAPSULE    Take 2 tablets = 4mg  by mouth initially then 1 tablet =2mg  after each episode of diarrhea. DO NOT EXCEED 16MG  IN 24 HOUR PERIOD IF DIARRHEA PERSISTS MORE.   LORATADINE (CLARITIN) 10 MG TABLET    Take 10 mg by mouth daily.   LOSARTAN (COZAAR) 50 MG TABLET    Take 50 mg by mouth daily.   MAGNESIUM OXIDE (MAG-OX) 400 MG TABLET    Take 400 mg by mouth daily.   METOPROLOL TARTRATE (LOPRESSOR) 25 MG TABLET    Take 25 mg by mouth 2 (two) times daily. Take 25 mg by mouth twice a  day for HTN   MULTIPLE VITAMINS-MINERALS (THEREMS M PO)    Take 1 tablet by mouth daily.   OXYBUTYNIN (DITROPAN) 5 MG TABLET    Take 5 mg by mouth 3 (three) times daily.   PANTOPRAZOLE (PROTONIX) 40 MG TABLET    Take 40 mg by mouth 2 (two) times daily.    PROPYLENE GLYCOL (SYSTANE BALANCE OP)    Apply 1 drop to eye 2 (two) times daily. Both eyes   PROTEIN (PROCEL PO)    Take 1 scoop by mouth 2 (two) times daily.   Modified Medications   No medications on file  Discontinued Medications   SACCHAROMYCES BOULARDII (FLORASTOR) 250 MG CAPSULE    Take 250 mg by mouth 2 (two) times daily. Reported on 02/25/2016   SULFAMETHOXAZOLE-TRIMETHOPRIM (BACTRIM DS,SEPTRA DS) 800-160 MG TABLET    Take 1 tablet by mouth 2 (two) times daily. Reported on 02/25/2016     Allergies  Allergen Reactions  . Codeine   . Indocin [Indomethacin]   . Morphine And Related      REVIEW OF SYSTEMS:  GENERAL: no change in appetite, no fatigue, no weight changes, no fever, chills or weakness EYES: Denies change in vision, dry eyes, eye pain, itching or discharge EARS: Denies change in hearing, ringing in ears, or earache NOSE: Denies nasal congestion or epistaxis MOUTH and THROAT: Denies oral discomfort, gingival pain or bleeding, pain from teeth or hoarseness   RESPIRATORY: no cough, SOB, DOE, wheezing, hemoptysis CARDIAC: no chest pain, edema or palpitations GI: no abdominal pain, diarrhea, constipation, heart burn, nausea or vomiting GU: Denies dysuria, frequency, hematuria, incontinence, or discharge PSYCHIATRIC: Denies feeling of depression or anxiety. No report of hallucinations, insomnia, paranoia, or agitation    PHYSICAL EXAMINATION  GENERAL APPEARANCE: Well nourished. In no acute distress. Obese SKIN:  Skin is warm and dry.  HEAD: Normal in size and contour. No evidence of trauma EYES: Lids open and close normally. No blepharitis, entropion or ectropion. PERRL. Conjunctivae are clear and sclerae are white.  Lenses are without opacity EARS: Pinnae are normal. Patient hears normal voice tunes of the examiner MOUTH and THROAT: Lips are without lesions. Oral mucosa is moist and without lesions. Tongue is normal in shape, size, and color and without lesions NECK: supple, trachea midline, no  neck masses, no thyroid tenderness, no thyromegaly LYMPHATICS: no LAN in the neck, no supraclavicular LAN RESPIRATORY: breathing is even & unlabored, BS CTAB CARDIAC: RRR, no murmur,no extra heart sounds, no edema GI: abdomen soft, normal BS, no masses, no tenderness, no hepatomegaly, no splenomegaly EXTREMITIES:  Able to move X 4 extremiteis PSYCHIATRIC: Alert to person, disoriented to time and place. Affect and behavior are appropriate  LABS/RADIOLOGY: Labs reviewed: Basic Metabolic Panel:  Recent Labs  96/04/54 01/28/16 03/10/16  NA 145 147 143  K 3.8 4.5 4.0  BUN 49* 15 20  CREATININE 2.0* 1.0 1.1   Liver Function Tests:  Recent Labs  09/04/15 12/03/15 03/10/16  AST 9* 13 11*  ALT 4* 5* 14  ALKPHOS 120 120 109   CBC:  Recent Labs  12/03/15 01/20/16 03/10/16  WBC 10.2 10.6 9.6  NEUTROABS HGB 13.0 14.1 12.0  HCT 40 44 37  PLT 286 299 273   Lipid Panel:  Recent Labs  09/04/15 12/03/15  HDL 37 32*    ASSESSMENT/PLAN:  UTI - start Bactrim DS 1 tab PO BID X 7 days and Florastor 250 mg 1 capsule PO BID X 10 days; observe contact precaution    Kenard Gower, NP [employer] [phone]

## 2016-02-25 ENCOUNTER — Encounter: Payer: Self-pay | Admitting: Urology

## 2016-02-25 ENCOUNTER — Ambulatory Visit (INDEPENDENT_AMBULATORY_CARE_PROVIDER_SITE_OTHER): Payer: Medicare Other | Admitting: Urology

## 2016-02-25 VITALS — BP 110/69 | HR 77 | Ht 64.0 in | Wt 241.8 lb

## 2016-02-25 DIAGNOSIS — R300 Dysuria: Secondary | ICD-10-CM

## 2016-02-25 DIAGNOSIS — N952 Postmenopausal atrophic vaginitis: Secondary | ICD-10-CM | POA: Diagnosis not present

## 2016-02-25 DIAGNOSIS — N390 Urinary tract infection, site not specified: Secondary | ICD-10-CM

## 2016-02-25 DIAGNOSIS — R32 Unspecified urinary incontinence: Secondary | ICD-10-CM

## 2016-02-25 MED ORDER — SULFAMETHOXAZOLE-TRIMETHOPRIM 800-160 MG PO TABS: 1.0000 | ORAL_TABLET | Freq: Two times a day (BID) | ORAL | Status: DC

## 2016-02-25 MED ORDER — ESTROGENS, CONJUGATED 0.625 MG/GM VA CREA: 1.0000 | TOPICAL_CREAM | Freq: Every day | VAGINAL | Status: DC

## 2016-02-25 MED ORDER — ESTRADIOL 0.1 MG/GM VA CREA: TOPICAL_CREAM | VAGINAL | Status: DC

## 2016-02-25 NOTE — Patient Instructions (Addendum)
I explained to the patient that when women go through menopause and her estrogen levels are severely diminished, the normal vaginal flora will change.  This is due to an increase of the vaginal canal's pH. Because of this, the vaginal canal may be colonized by bacteria from the rectum instead of the protective lactobacillus.  This accompanied by the loss of the mucus barrier with vaginal atrophy is a cause of recurrent urinary tract infections.  In some studies, it has been demonstrated that patients can experience a reduction in recurrent urinary tract infections to one a year.   Patient was given a sample of vaginal estrogen cream is to be applied, 0.5mg  (pea-sized amount),   just inside the vaginal introitus with a finger-tip every night for two weeks and then Monday, Wednesday and Friday nights.    I have also given prescriptions for the Estrace cream and Premarin cream, so that the patient may carry them to the pharmacy to see which one of the branded creams would be most economical for her.  She will return in 2 weeks for symptom recheck, exam and report if she was available to require the vaginal cream by prescription.   It is important that she be on the cream because of her history of recurrent UTI's.  Her UA today contained > 30 WBC's/hpf.  This was a CATH specimen.  I will send it for culture.

## 2016-02-25 NOTE — Progress Notes (Signed)
02/25/2016 1:17 PM   Maureen Taylor [birth year] [id number]  Referring provider: No referring provider defined for this encounter.  Chief Complaint  Patient presents with  . Dysuria    HPI: Patient is a 81 year old Caucasian female who presents today for dysuria at the request of her SNF.  Patient states that she has been experiencing dysuria for the last 2 weeks. She also is been having frequent urination, nocturia and urinary leakage.  She also stated that the staff were wearing yellow gowns when they were caring for her.  In her referral notes given to [place], her urine culture from 02/22/2016 was positive for ESBL.  We were not notified of this finding with the patient arrived.  She has a history of recurrent UTI's.  Patient has had a positive urine for Klebsiella 05/27/2014, a positive urine culture for Escherichia coli and strep viridians on 10/30/2014, a positive urine culture for Citrobacter freudii on 02/19/2015, a positive urine culture for Escherichia coli on 05/05/2015, a positive urine culture on 06/08/2015 for Klebsiella pneumoniae and a positive urine culture for E.coli on 08/26/2015.  Her symptoms at the time of the infections are dysuria and vaginal itching. Imaging studies performed in June 2016 (RUS and KUB) does not identify any nephrolithiasis or hydronephrosis.     She now has a recent UTI with E. Coli with ESBL from 02/22/2016.  She is currently on Bactrim DS.  She is not having fevers, chills, nausea or vomiting.  She is experiencing frequent urination, dysuria, nocturia and leakage of urine.  She states that the oxybutynin is not helping.  She denied gross hematuria.   Her CATH UA demonstrated > 30 WBC's/hpf at today's visit.    PMH: Past Medical History  Diagnosis Date  . Vitamin deficiency   . Rosacea   . Candidiasis   . Magnesium deficiency   . History of falling   . Mixed incontinence   . Ascorbic acid deficiency   . Slow transit constipation   .  Pneumonia   . GERD (gastroesophageal reflux disease)   . Major depressive disorder (HCC)   . Allergic rhinitis   . Charcot's joint   . Muscle weakness   . Obesity   . OAB (overactive bladder)   . Vaginal atrophy   . Parkinson disease (HCC)   . Neuropathy (HCC)   . Bipolar affective disorder (HCC)   . Protein calorie malnutrition (HCC)   . Oral pain   . Type 2 diabetes mellitus with diabetic polyneuropathy, with long-term current use of insulin (HCC)   . HLD (hyperlipidemia)   . Urinary tract infection without hematuria   . HTN (hypertension)     Surgical History: Past Surgical History  Procedure Laterality Date  . Cholecystectomy    . Abdominal hysterectomy    . Revision total knee arthroplasty Right   . Cervical conization w/bx      Home Medications:    Medication List       This list is accurate as of: 02/25/16 11:59 PM.  Always use your most recent med list.               acetaminophen 325 MG tablet  Commonly known as:  TYLENOL  Take 650 mg by mouth every 8 (eight) hours as needed.     Acidophilus Caps capsule  Take 1 capsule by mouth daily. 175 mg capsule     ARIPiprazole 2 MG tablet  Commonly known as:  ABILIFY  Take 2  mg by mouth daily.     aspirin 81 MG EC tablet  Take 81 mg by mouth daily. Swallow whole.     atorvastatin 10 MG tablet  Commonly known as:  LIPITOR  Take 10 mg by mouth daily.     carbidopa-levodopa 25-100 MG tablet  Commonly known as:  SINEMET IR  Take 1 tablet by mouth 2 (two) times daily.     conjugated estrogens vaginal cream  Commonly known as:  PREMARIN  Place 1 Applicatorful vaginally daily. Apply 0.5mg  (pea-sized amount)  just inside the vaginal introitus with a finger-tip every night for two weeks and then Monday, Wednesday and Friday nights.     Cranberry 200 MG Caps  Take 400 mg by mouth daily. Take two 200-mg capsules = 400 mg     DULoxetine 60 MG capsule  Commonly known as:  CYMBALTA  Take 60 mg by mouth daily.       estradiol 0.1 MG/GM vaginal cream  Commonly known as:  ESTRACE VAGINAL  Apply 0.5mg  (pea-sized amount)  just inside the vaginal introitus with a finger-tip every night for two weeks and then Monday, Wednesday and Friday nights.     gabapentin 100 MG capsule  Commonly known as:  NEURONTIN  Take 200 mg by mouth 2 (two) times daily. Take two 100 mg capsules to = 200 mg PO BID     glimepiride 4 MG tablet  Commonly known as:  AMARYL  Take 4 mg by mouth daily with breakfast.     insulin aspart 100 UNIT/ML injection  Commonly known as:  novoLOG  If CBG >400, Give NOVOLOG 8 UNITS SQ BID PRN     insulin glargine 100 UNIT/ML injection  Commonly known as:  LANTUS  30 Units SQ BID (AM and HS)     lamoTRIgine 100 MG tablet  Commonly known as:  LAMICTAL  Take 100 mg by mouth 2 (two) times daily.     loperamide 2 MG capsule  Commonly known as:  IMODIUM  Take 2 tablets = 4mg  by mouth initially then 1 tablet =2mg  after each episode of diarrhea. DO NOT EXCEED 16MG  IN 24 HOUR PERIOD IF DIARRHEA PERSISTS MORE.     loratadine 10 MG tablet  Commonly known as:  CLARITIN  Take 10 mg by mouth daily.     losartan 50 MG tablet  Commonly known as:  COZAAR  Take 50 mg by mouth daily.     magnesium oxide 400 MG tablet  Commonly known as:  MAG-OX  Take 400 mg by mouth daily.     metoprolol tartrate 25 MG tablet  Commonly known as:  LOPRESSOR  Take 25 mg by mouth 2 (two) times daily. Take 25 mg by mouth twice a day for HTN     oxybutynin 5 MG tablet  Commonly known as:  DITROPAN  Take 5 mg by mouth 3 (three) times daily.     pantoprazole 40 MG tablet  Commonly known as:  PROTONIX  Take 40 mg by mouth daily.     PROCEL PO  Take 1 scoop by mouth 2 (two) times daily.     saccharomyces boulardii 250 MG capsule  Commonly known as:  FLORASTOR  Take 250 mg by mouth 2 (two) times daily. Reported on 02/25/2016     sulfamethoxazole-trimethoprim 800-160 MG tablet  Commonly known as:  BACTRIM  DS,SEPTRA DS  Take 1 tablet by mouth 2 (two) times daily. Reported on 02/25/2016     sulfamethoxazole-trimethoprim 800-160 MG  tablet  Commonly known as:  BACTRIM DS,SEPTRA DS  Take 1 tablet by mouth every 12 (twelve) hours.     SYSTANE BALANCE OP  Apply 1 drop to eye 2 (two) times daily. Both eyes     THEREMS M PO  Take 1 tablet by mouth daily.        Allergies:  Allergies  Allergen Reactions  . Codeine   . Indocin [Indomethacin]   . Morphine And Related     Family History: Family History  Problem Relation Age of Onset  . Kidney disease Neg Hx   . Bladder Cancer Neg Hx   . Prostate cancer Neg Hx     Social History:  reports that she has never smoked. She does not have any smokeless tobacco history on file. She reports that she does not drink alcohol or use illicit drugs.  ROS: UROLOGY Frequent Urination?: Yes Hard to postpone urination?: No Burning/pain with urination?: Yes Get up at night to urinate?: Yes Leakage of urine?: Yes Urine stream starts and stops?: No Trouble starting stream?: No Do you have to strain to urinate?: No Blood in urine?: No Urinary tract infection?: No Sexually transmitted disease?: No Injury to kidneys or bladder?: No Painful intercourse?: No Weak stream?: No Currently pregnant?: No Vaginal bleeding?: No Last menstrual period?: n  Gastrointestinal Nausea?: No Vomiting?: No Indigestion/heartburn?: Yes Diarrhea?: No Constipation?: Yes  Constitutional Fever: No Night sweats?: No Weight loss?: Yes Fatigue?: Yes  Skin Skin rash/lesions?: No Itching?: Yes  Eyes Blurred vision?: Yes Double vision?: No  Ears/Nose/Throat Sore throat?: Yes Sinus problems?: Yes  Hematologic/Lymphatic Swollen glands?: No Easy bruising?: No  Cardiovascular Leg swelling?: Yes Chest pain?: No  Respiratory Cough?: No Shortness of breath?: Yes  Endocrine Excessive thirst?: No  Musculoskeletal Back pain?: Yes Joint pain?:  No  Neurological Headaches?: No Dizziness?: Yes  Psychologic Depression?: Yes Anxiety?: No  Physical Exam: BP 110/69 mmHg  Pulse 77  Ht 5\' 4"  (1.626 m)  Wt 241 lb 12.8 oz (109.68 kg)  BMI 41.48 kg/m2  Constitutional: Well nourished. Alert and oriented, No acute distress. HEENT: [place] AT, moist mucus membranes. Trachea midline, no masses. Cardiovascular: No clubbing, cyanosis, or edema. Respiratory: Normal respiratory effort, no increased work of breathing. GI: Abdomen is soft, non tender, non distended, no abdominal masses. Liver and spleen not palpable.  No hernias appreciated.  Stool sample for occult testing is not indicated.   GU: No CVA tenderness.  No bladder fullness or masses.  Atrophic external genitalia, normal pubic hair distribution, no lesions.  Normal urethral meatus, no lesions, no prolapse, no discharge.   No urethral masses, tenderness and/or tenderness. Patient's introitus was very tender and would not allow a pelvic exam.   Skin: No rashes, bruises or suspicious lesions. Lymph: No cervical or inguinal adenopathy. Neurologic: Grossly intact, no focal deficits, moving all 4 extremities. Psychiatric: Normal mood and affect.  Laboratory Data: Lab Results  Component Value Date   WBC 10.6 01/20/2016   HGB 14.1 01/20/2016   HCT 44 01/20/2016   MCV 92 06/01/2014   PLT 299 01/20/2016    Lab Results  Component Value Date   CREATININE 1.0 01/28/2016     Lab Results  Component Value Date   HGBA1C 9.7 12/03/2015    Lab Results  Component Value Date   TSH 2.73 06/03/2015       Component Value Date/Time   CHOL 135 12/03/2015   HDL 32* 12/03/2015   LDLCALC 77 12/03/2015  Lab Results  Component Value Date   AST 13 12/03/2015   Lab Results  Component Value Date   ALT 5* 12/03/2015     Urinalysis Results for orders placed or performed in visit on 02/25/16  CULTURE, URINE COMPREHENSIVE  Result Value Ref Range   Urine Culture, Comprehensive  Final report    Result 1 Lactobacillus species   Microscopic Examination  Result Value Ref Range   WBC, UA >30 (A) 0 -  5 /hpf   RBC, UA None seen 0 -  2 /hpf   Epithelial Cells (non renal) 0-10 0 - 10 /hpf   Bacteria, UA None seen None seen/Few  Urinalysis, Complete  Result Value Ref Range   Specific Gravity, UA 1.025 1.005 - 1.030   pH, UA 5.5 5.0 - 7.5   Color, UA Yellow Yellow   Appearance Ur Cloudy (A) Clear   Leukocytes, UA 3+ (A) Negative   Protein, UA Negative Negative/Trace   Glucose, UA 1+ (A) Negative   Ketones, UA Negative Negative   RBC, UA Trace (A) Negative   Bilirubin, UA Negative Negative   Urobilinogen, Ur 0.2 0.2 - 1.0 mg/dL   Nitrite, UA Negative Negative   Microscopic Examination See below:     Procedure In and Out Catheterization Patient is present today for a I & O catheterization due to recurrent UTI's. Patient was cleaned and prepped in a sterile fashion with betadine and Lidocaine 2% jelly was instilled into the urethra.  A 14 FR cath was inserted no complications were noted , 40 ml of urine return was noted, urine was yellow in color. A clean urine sample was collected for culture. Bladder was drained  And catheter was removed with out difficulty.    Preformed by: Harle Battiest, PA-C, Jerl Mina, CMA and Rupert Stacks LPN   Assessment & Plan:    1. Dysuria:   Because patient has vaginal atrophy, it is difficult to distinguish between burning appearing passing to the urethra or hitting the vaginal skin.  I do not see vaginal estrogen cream and her MAR and we will restart that cream today.  I recommend patient have CATH UA's when she is having possible symptoms of an UTI since she has a history of recurrent urinary tract infections.  I will send her CATH urine for culture.    - Urinalysis, Complete  2. Vaginal atrophy:    I explained to the patient that when women go through menopause and her estrogen levels are severely diminished, the  normal vaginal flora will change.  This is due to an increase of the vaginal canal's pH. Because of this, the vaginal canal may be colonized by bacteria from the rectum instead of the protective lactobacillus.  This accompanied by the loss of the mucus barrier with vaginal atrophy is a cause of recurrent urinary tract infections.  In some studies, it has been demonstrated that patients can experience a reduction in recurrent urinary tract infections to one a year.   I have also given prescriptions for the Estrace cream and Premarin cream, so that the patient may carry them to the pharmacy to see which one of the branded creams would be most economical for her.    Patient will continue the vaginal estrogen cream, applying it 3 nights weekly.  She will follow-up in 3 months for exam and symptom recheck.  3. Incontinence:   Patient is still having incontinence with oxybutynin.  We will reassess once her urine culture results are  available.    4. Recurrent UTIs:   Because of patient's history and body habitus, I would recommend CATH UA's if she should experience UTI symptoms.  Those symptoms being mental status changes, gross hematuria or fevers.    Return for pending urine culture results.  These notes generated with voice recognition software. I apologize for typographical errors.  Michiel Cowboy, PA-C  [facility] [address] [place], [place] [address] [phone]

## 2016-02-26 LAB — MICROSCOPIC EXAMINATION
Bacteria, UA: NONE SEEN
RBC, UA: NONE SEEN /hpf (ref 0–?)

## 2016-02-26 LAB — URINALYSIS, COMPLETE
Bilirubin, UA: NEGATIVE
Ketones, UA: NEGATIVE
Nitrite, UA: NEGATIVE
PH UA: 5.5 (ref 5.0–7.5)
PROTEIN UA: NEGATIVE
Specific Gravity, UA: 1.025 (ref 1.005–1.030)
Urobilinogen, Ur: 0.2 mg/dL (ref 0.2–1.0)

## 2016-02-28 LAB — CULTURE, URINE COMPREHENSIVE

## 2016-02-29 ENCOUNTER — Telehealth: Payer: Self-pay

## 2016-02-29 NOTE — Telephone Encounter (Signed)
Spoke with pt caregiver and made aware of negative ucx. Caregiver voiced understanding.

## 2016-02-29 NOTE — Telephone Encounter (Signed)
-----   Message from Harle BattiestShannon A McGowan, PA-C sent at 02/28/2016  4:43 PM EDT ----- Patient's urine culture from her cath specimen was negative.

## 2016-03-01 ENCOUNTER — Telehealth: Payer: Self-pay | Admitting: Urology

## 2016-03-01 NOTE — Telephone Encounter (Signed)
I would like the patient to have a follow up visit in 3 months after being on the vaginal estrogen cream.

## 2016-03-02 NOTE — Telephone Encounter (Signed)
Attempted 3x to reach nurse on pt floor and was disconnected each time. Therefore a letter will be sent.

## 2016-03-09 ENCOUNTER — Non-Acute Institutional Stay (SKILLED_NURSING_FACILITY): Payer: Medicare Other | Admitting: Internal Medicine

## 2016-03-09 ENCOUNTER — Encounter: Payer: Self-pay | Admitting: Internal Medicine

## 2016-03-09 DIAGNOSIS — E785 Hyperlipidemia, unspecified: Secondary | ICD-10-CM | POA: Diagnosis not present

## 2016-03-09 DIAGNOSIS — K219 Gastro-esophageal reflux disease without esophagitis: Secondary | ICD-10-CM | POA: Diagnosis not present

## 2016-03-09 DIAGNOSIS — E1142 Type 2 diabetes mellitus with diabetic polyneuropathy: Secondary | ICD-10-CM

## 2016-03-09 DIAGNOSIS — I10 Essential (primary) hypertension: Secondary | ICD-10-CM | POA: Diagnosis not present

## 2016-03-09 DIAGNOSIS — F3130 Bipolar disorder, current episode depressed, mild or moderate severity, unspecified: Secondary | ICD-10-CM

## 2016-03-09 DIAGNOSIS — Z794 Long term (current) use of insulin: Secondary | ICD-10-CM | POA: Diagnosis not present

## 2016-03-09 DIAGNOSIS — R300 Dysuria: Secondary | ICD-10-CM | POA: Diagnosis not present

## 2016-03-09 DIAGNOSIS — K5900 Constipation, unspecified: Secondary | ICD-10-CM | POA: Diagnosis not present

## 2016-03-09 DIAGNOSIS — R32 Unspecified urinary incontinence: Secondary | ICD-10-CM | POA: Diagnosis not present

## 2016-03-09 DIAGNOSIS — M792 Neuralgia and neuritis, unspecified: Secondary | ICD-10-CM

## 2016-03-09 DIAGNOSIS — K5909 Other constipation: Secondary | ICD-10-CM

## 2016-03-09 DIAGNOSIS — F319 Bipolar disorder, unspecified: Secondary | ICD-10-CM

## 2016-03-09 NOTE — Progress Notes (Signed)
Patient ID: Maureen Taylor, female   DOB: [birth year], 81 y.o.   MRN: [id number]     [facility]   PCP: No PCP Per Patient  Code Status: Full Code  Allergies  Allergen Reactions  . Codeine   . Indocin [Indomethacin]   . Morphine And Related     Chief Complaint  Patient presents with  . Medical Management of Chronic Issues    Routine Visit    No flowsheet data found.   HPI:  81 year old patient is seen for routine visit in her room. She is in no distress. She was recently seen by urology with recurrent uti. She was thought to have vaginal atrophy and is now on premarin cream. She continues to complaint of urinary complaints. She has been constipated and complaints of straining with bowel movement. No new concern from nursing staff.    Review of Systems:  Constitutional: positive for easy fatigue. Negative for fever, chills, diaphoresis.  HENT: Negative for headache, congestion, nasal discharge. Allergies controlled Eyes: Negative for eye pain, blurred vision, double vision and discharge.  Respiratory: Negative for cough, shortness of breath and wheezing.   Cardiovascular: Negative for chest pain, palpitations, leg swelling.  Gastrointestinal: Negative for nausea, vomiting, abdominal pain. Positive for frequent heartburn Musculoskeletal: Negative for back pain, recent falls Skin: Negative for itching, rash.  Neurological: Negative for dizziness Psychiatric/Behavioral: positive for feeling low  Past Medical History  Diagnosis Date  . Vitamin deficiency   . Rosacea   . Candidiasis   . Magnesium deficiency   . History of falling   . Mixed incontinence   . Ascorbic acid deficiency   . Slow transit constipation   . Pneumonia   . GERD (gastroesophageal reflux disease)   . Major depressive disorder (HCC)   . Allergic rhinitis   . Charcot's joint   . Muscle weakness   . Obesity   . OAB (overactive bladder)   . Vaginal atrophy   .  Parkinson disease (HCC)   . Neuropathy (HCC)   . Bipolar affective disorder (HCC)   . Protein calorie malnutrition (HCC)   . Oral pain   . Type 2 diabetes mellitus with diabetic polyneuropathy, with long-term current use of insulin (HCC)   . HLD (hyperlipidemia)   . Urinary tract infection without hematuria   . HTN (hypertension)      Medications: Patient's Medications  New Prescriptions   No medications on file  Previous Medications   ACETAMINOPHEN (TYLENOL) 325 MG TABLET    Take 650 mg by mouth as needed for mild pain.    ARIPIPRAZOLE (ABILIFY) 2 MG TABLET    Take 2 mg by mouth daily.   ASPIRIN 81 MG EC TABLET    Take 81 mg by mouth daily. Swallow whole.   ATORVASTATIN (LIPITOR) 10 MG TABLET    Take 10 mg by mouth daily.   CARBIDOPA-LEVODOPA (SINEMET IR) 25-100 MG TABLET    Take 1 tablet by mouth 2 (two) times daily.   CONJUGATED ESTROGENS (PREMARIN) VAGINAL CREAM    Place 1 Applicatorful vaginally daily. Apply 0.5mg  (pea-sized amount)  just inside the vaginal introitus with a finger-tip every night for two weeks and then Monday, Wednesday and Friday nights.   CRANBERRY 200 MG CAPS    Take 400 mg by mouth daily. Take two 200-mg capsules = 400 mg   DULOXETINE (CYMBALTA) 60 MG CAPSULE    Take 60 mg by mouth daily.   ESTRADIOL (ESTRACE VAGINAL)  0.1 MG/GM VAGINAL CREAM    Apply 0.5mg  (pea-sized amount)  just inside the vaginal introitus with a finger-tip every night for two weeks and then Monday, Wednesday and Friday nights.   GABAPENTIN (NEURONTIN) 100 MG CAPSULE    Take 200 mg by mouth 2 (two) times daily. Take two 100 mg capsules to = 200 mg PO BID   GLIMEPIRIDE (AMARYL) 4 MG TABLET    Take 4 mg by mouth daily with breakfast.   INSULIN ASPART (NOVOLOG) 100 UNIT/ML INJECTION    Inject 8 Units into the skin 2 (two) times daily. If CBG >400, Give NOVOLOG 8 UNITS SQ BID PRN   INSULIN GLARGINE (LANTUS) 100 UNIT/ML INJECTION    Reported on 03/09/2016   LACTOBACILLUS (ACIDOPHILUS) CAPS CAPSULE     Take 1 capsule by mouth daily. 175 mg capsule   LAMOTRIGINE (LAMICTAL) 100 MG TABLET    Take 100 mg by mouth 2 (two) times daily.   LOPERAMIDE (IMODIUM) 2 MG CAPSULE    Take 2 tablets =  by mouth initially then 1 tablet =2mg  after each episode of diarrhea. DO NOT EXCEED  IN 24 HOUR PERIOD IF DIARRHEA PERSISTS MORE.   LORATADINE (CLARITIN) 10 MG TABLET    Take 10 mg by mouth daily.   LOSARTAN (COZAAR) 50 MG TABLET    Take 50 mg by mouth daily.   MAGNESIUM OXIDE (MAG-OX) 400 MG TABLET    Take 400 mg by mouth daily.   METOPROLOL TARTRATE (LOPRESSOR) 25 MG TABLET    Take 25 mg by mouth 2 (two) times daily. Take 25 mg by mouth twice a day for HTN   MULTIPLE VITAMINS-MINERALS (THEREMS M PO)    Take 1 tablet by mouth daily.   OXYBUTYNIN (DITROPAN) 5 MG TABLET    Take 5 mg by mouth 3 (three) times daily.   PANTOPRAZOLE (PROTONIX) 40 MG TABLET    Take 40 mg by mouth daily.   PROPYLENE GLYCOL (SYSTANE BALANCE OP)    Apply 1 drop to eye 2 (two) times daily. Both eyes   PROTEIN (PROCEL PO)    Take 1 scoop by mouth 2 (two) times daily.   Modified Medications   No medications on file  Discontinued Medications   SACCHAROMYCES BOULARDII (FLORASTOR) 250 MG CAPSULE    Take 250 mg by mouth 2 (two) times daily. Reported on 02/25/2016   SULFAMETHOXAZOLE-TRIMETHOPRIM (BACTRIM DS,SEPTRA DS) 800-160 MG TABLET    Take 1 tablet by mouth 2 (two) times daily. Reported on 02/25/2016   SULFAMETHOXAZOLE-TRIMETHOPRIM (BACTRIM DS,SEPTRA DS) 800-160 MG TABLET    Take 1 tablet by mouth every 12 (twelve) hours.     Physical Exam: Filed Vitals:   03/09/16 1400  BP: 130/64  Pulse: 86  Temp: 97.8 F (36.6 C)  TempSrc: Oral  Resp: 16  Height:  (1.626 m)  Weight: 241 lb 12.8 oz (109.68 kg)  SpO2: 98%  Body mass index is 41.48 kg/(m^2).  Wt Readings from Last 3 Encounters:  03/09/16 241 lb 12.8 oz (109.68 kg)  02/25/16 241 lb 12.8 oz (109.68 kg)  02/24/16 239 lb 6.4 oz (108.591 kg)   General- elderly  female, obese,  in no acute distress Head- normocephalic, atraumatic Throat- moist mucus membrane Eyes- PERRLA, EOMI, no pallor, no icterus, no discharge, normal conjunctiva, normal sclera Neck- no cervical lymphadenopathy, no thyromegaly, no jugular vein distension Cardiovascular- normal s1,s2, no murmurs, palpable dorsalis pedis, trace leg edema Respiratory- bilateral clear to auscultation, no wheeze, no rhonchi, no crackles,  no use of accessory muscles Abdomen- bowel sounds present, soft, non tender Musculoskeletal- able to move all 4 extremities, limited normal range of motion , uses wheelchair and rollator walker Neurological- alert and oriented x 3 Skin- warm and dry Psychiatry- flat affect    Labs reviewed: CBC Latest Ref Rng 01/20/2016 12/03/2015 09/04/2015  WBC - 10.6 10.2 11.6  Hemoglobin 12.0 - 16.0 g/dL 16.114.1 09.613.0 04.513.5  Hematocrit 36 - 46 % 44 40 41  Platelets 150 - 399 K/L 299 286 310   CMP Latest Ref Rng 01/28/2016 01/20/2016 12/03/2015  Glucose 65-99 mg/dL - - -  BUN 4 - 21 mg/dL 15 40(J49(A) 21  Creatinine 0.5 - 1.1 mg/dL 1.0 2.0(A) 1.0  Sodium 137 - 147 mmol/L 147 145 141  Potassium 3.4 - 5.3 mmol/L 4.5 3.8 4.3  Chloride 98-107 mmol/L - - -  CO2 21-32 mmol/L - - -  Calcium 8.5-10.1 mg/dL - - -  Total Protein 8.1-1.96.4-8.2 g/dL - - -  Total Bilirubin 0.2-1.0 mg/dL - - -  Alkaline Phos 25 - 125 U/L - - 120  AST 13 - 35 U/L - - 13  ALT 7 - 35 U/L - - 5(A)   Lab Results  Component Value Date   HGBA1C 9.7 12/03/2015   Lipid Panel     Component Value Date/Time   CHOL 135 12/03/2015   TRIG 133 12/03/2015   HDL 32* 12/03/2015   LDLCALC 77 12/03/2015     Assessment/Plan  Dm type 2 with neuropathy a1c suggests poorly controlled DM. Marland Kitchen. Currently on Lantus 30 units twice daily and Glimepiride 4 mg daily with SSI novolog. Monitor cbg. Continue baby aspirin and statin. Continue losartan. Continue gabapentin with cymbalata current regimen, no changes made. Check a1c and  bmp  Neuropathic pain Continue gabapentin 200 mg bid and cymbalta 60 mg daily  Dysuria Recent urine culture from urology office was negative. Afebrile. Continue premarin cream for now and monitor. Continue perineal hygiene. D/c estradiol cream. Continue cranberry supplement  Hypertension Continue losartan 50 mg daily and metoprolol 25 mg bid and monitor bp  gerd change protonix 40 mg to twice daily for now and monitor  Bipolar disorder continue Lamictal 100 mg bid, duloxetine 60 mg daily. Followed by psych services  UI Persists continue oxybutynin and pending urology follow up  Constipation Start colace 100 mg bid and monitor   Oneal GroutMAHIMA Milessa Hogan, MD  [facility] [phone] (Monday-Friday 8 am - 5 pm) [phone] (afterhours)

## 2016-03-10 DIAGNOSIS — D649 Anemia, unspecified: Secondary | ICD-10-CM | POA: Diagnosis not present

## 2016-03-10 DIAGNOSIS — E1065 Type 1 diabetes mellitus with hyperglycemia: Secondary | ICD-10-CM | POA: Diagnosis not present

## 2016-03-10 LAB — HEPATIC FUNCTION PANEL
ALK PHOS: 109 U/L (ref 25–125)
ALT: 14 U/L (ref 7–35)
AST: 11 U/L — AB (ref 13–35)
Bilirubin, Total: 0.4 mg/dL

## 2016-03-10 LAB — CBC AND DIFFERENTIAL
HCT: 37 % (ref 36–46)
HEMOGLOBIN: 12 g/dL (ref 12.0–16.0)
Neutrophils Absolute: 5 /uL
Platelets: 273 10*3/uL (ref 150–399)
WBC: 9.6 10^3/mL

## 2016-03-10 LAB — BASIC METABOLIC PANEL
BUN: 20 mg/dL (ref 4–21)
CREATININE: 1.1 mg/dL (ref 0.5–1.1)
Glucose: 172 mg/dL
Potassium: 4 mmol/L (ref 3.4–5.3)
SODIUM: 143 mmol/L (ref 137–147)

## 2016-03-10 LAB — HEMOGLOBIN A1C: Hemoglobin A1C: 9

## 2016-03-31 ENCOUNTER — Encounter: Payer: Self-pay | Admitting: Adult Health

## 2016-03-31 ENCOUNTER — Non-Acute Institutional Stay (SKILLED_NURSING_FACILITY): Payer: Medicare Other | Admitting: Adult Health

## 2016-03-31 DIAGNOSIS — K5900 Constipation, unspecified: Secondary | ICD-10-CM

## 2016-03-31 DIAGNOSIS — I10 Essential (primary) hypertension: Secondary | ICD-10-CM | POA: Diagnosis not present

## 2016-03-31 DIAGNOSIS — E46 Unspecified protein-calorie malnutrition: Secondary | ICD-10-CM

## 2016-03-31 DIAGNOSIS — G20 Parkinson's disease: Secondary | ICD-10-CM | POA: Diagnosis not present

## 2016-03-31 DIAGNOSIS — N3281 Overactive bladder: Secondary | ICD-10-CM | POA: Diagnosis not present

## 2016-03-31 DIAGNOSIS — N952 Postmenopausal atrophic vaginitis: Secondary | ICD-10-CM

## 2016-03-31 DIAGNOSIS — M792 Neuralgia and neuritis, unspecified: Secondary | ICD-10-CM

## 2016-03-31 DIAGNOSIS — K219 Gastro-esophageal reflux disease without esophagitis: Secondary | ICD-10-CM

## 2016-03-31 DIAGNOSIS — E785 Hyperlipidemia, unspecified: Secondary | ICD-10-CM | POA: Diagnosis not present

## 2016-03-31 DIAGNOSIS — J309 Allergic rhinitis, unspecified: Secondary | ICD-10-CM | POA: Diagnosis not present

## 2016-03-31 DIAGNOSIS — E1142 Type 2 diabetes mellitus with diabetic polyneuropathy: Secondary | ICD-10-CM

## 2016-03-31 DIAGNOSIS — F3130 Bipolar disorder, current episode depressed, mild or moderate severity, unspecified: Secondary | ICD-10-CM

## 2016-03-31 DIAGNOSIS — F319 Bipolar disorder, unspecified: Secondary | ICD-10-CM

## 2016-03-31 DIAGNOSIS — Z794 Long term (current) use of insulin: Secondary | ICD-10-CM

## 2016-03-31 DIAGNOSIS — K5909 Other constipation: Secondary | ICD-10-CM

## 2016-03-31 NOTE — Progress Notes (Signed)
Patient ID: Maureen Taylor, female   DOB: Aug 08, 1935, 81 y.o.   MRN: [id number]    DATE:    03/31/16  MRN:  [id number]  BIRTHDAY: Aug 08, 1935  Facility:  Nursing Home Location:  [facility]  Nursing Home Room Number: 905-A  LEVEL OF CARE:  SNF [phone]  Contact Information    Name Relation Home Work [employer]   Kerr,Jennifer L Daughter [phone]     Wallene Huhlexander,[name] Daughter [phone]         Code Status History    This patient does not have a recorded code status. Please follow your organizational policy for patients in this situation.       Chief Complaint  Patient presents with  . Medical Management of Chronic Issues    HISTORY OF PRESENT ILLNESS:  This is a 81 year old female who is being seen for a routine visit. [facility] [address] [facility] She was recently started on Premarin vaginal cream for vaginal atrophy.  She has Bipolar Disorder and her mood has been stable. She is currently on Lamictal, Abilify and Cymbalta. BP review - 114/61, 133/68, 140/71, 124/69 and 114/71. She currently takes Losartan and Metoprolol.  PAST MEDICAL HISTORY:  Past Medical History  Diagnosis Date  . Vitamin deficiency   . Rosacea   . Candidiasis   . Magnesium deficiency   . History of falling   . Mixed incontinence   . Ascorbic acid deficiency   . Slow transit constipation   . Pneumonia   . GERD (gastroesophageal reflux disease)   . Major depressive disorder (HCC)   . Allergic rhinitis   . Charcot's joint   . Muscle weakness   . Obesity   . OAB (overactive bladder)   . Vaginal atrophy   . Parkinson disease (HCC)   . Neuropathy (HCC)   . Bipolar affective disorder (HCC)   . Protein calorie malnutrition (HCC)   . Oral pain   . Type 2 diabetes mellitus with diabetic polyneuropathy, with long-term current use of insulin (HCC)   . HLD (hyperlipidemia)   . Urinary tract infection without hematuria   . HTN (hypertension)   . Dysuria   .  Neuropathic pain   . Chronic constipation      CURRENT MEDICATIONS: Reviewed  Patient's Medications  New Prescriptions   No medications on file  Previous Medications   ACETAMINOPHEN (TYLENOL) 325 MG TABLET    Take 650 mg by mouth as needed for mild pain.    ARIPIPRAZOLE (ABILIFY) 2 MG TABLET    Take 2 mg by mouth daily.   ASPIRIN 81 MG EC TABLET    Take 81 mg by mouth daily. Swallow whole.   ATORVASTATIN (LIPITOR) 10 MG TABLET    Take 10 mg by mouth daily.   CARBIDOPA-LEVODOPA (SINEMET IR) 25-100 MG TABLET    Take 1 tablet by mouth 2 (two) times daily.   CONJUGATED ESTROGENS (PREMARIN) VAGINAL CREAM    Place 1 Applicatorful vaginally daily. Apply 0.5mg  (pea-sized amount)  just inside the vaginal introitus with a finger-tip every night for two weeks and then Monday, Wednesday and Friday nights.   CRANBERRY 200 MG CAPS    Take 400 mg by mouth daily. Take two 200-mg capsules = 400 mg   DOCUSATE SODIUM (COLACE) 100 MG CAPSULE    Take 100 mg by mouth 2 (two) times daily.   DULOXETINE (CYMBALTA) 60 MG CAPSULE    Take 60 mg by mouth daily.   GABAPENTIN (  NEURONTIN) 100 MG CAPSULE    Take 200 mg by mouth 2 (two) times daily. Take two 100 mg capsules to = 200 mg PO BID   GLIMEPIRIDE (AMARYL) 4 MG TABLET    Take 4 mg by mouth daily with breakfast.   INSULIN ASPART (NOVOLOG) 100 UNIT/ML INJECTION    Inject 8 Units into the skin 2 (two) times daily. If CBG >400, Give NOVOLOG 8 UNITS SQ BID PRN   INSULIN GLARGINE (LANTUS) 100 UNIT/ML INJECTION    Inject 30 Units into the skin 2 (two) times daily. QAM and QHS   LACTOBACILLUS (ACIDOPHILUS) CAPS CAPSULE    Take 1 capsule by mouth daily. 175 mg capsule   LAMOTRIGINE (LAMICTAL) 100 MG TABLET    Take 100 mg by mouth 2 (two) times daily.   LOPERAMIDE (IMODIUM) 2 MG CAPSULE    Take 2 tablets = 4mg  by mouth initially then 1 tablet =2mg  after each episode of diarrhea. DO NOT EXCEED 16MG  IN 24 HOUR PERIOD IF DIARRHEA PERSISTS MORE.   LORATADINE (CLARITIN) 10 MG  TABLET    Take 10 mg by mouth daily.   LOSARTAN (COZAAR) 50 MG TABLET    Take 50 mg by mouth daily.   MAGNESIUM OXIDE (MAG-OX) 400 MG TABLET    Take 400 mg by mouth daily.   METOPROLOL TARTRATE (LOPRESSOR) 25 MG TABLET    Take 25 mg by mouth 2 (two) times daily. Take 25 mg by mouth twice a day for HTN   MULTIPLE VITAMINS-MINERALS (THEREMS M PO)    Take 1 tablet by mouth daily.   OXYBUTYNIN (DITROPAN) 5 MG TABLET    Take 5 mg by mouth 3 (three) times daily.   PANTOPRAZOLE (PROTONIX) 40 MG TABLET    Take 40 mg by mouth 2 (two) times daily.    PROPYLENE GLYCOL (SYSTANE BALANCE OP)    Apply 1 drop to eye 2 (two) times daily. Both eyes   PROTEIN (PROCEL PO)    Take 1 scoop by mouth 2 (two) times daily.   Modified Medications   No medications on file  Discontinued Medications   ESTRADIOL (ESTRACE VAGINAL) 0.1 MG/GM VAGINAL CREAM    Apply 0.5mg  (pea-sized amount)  just inside the vaginal introitus with a finger-tip every night for two weeks and then Monday, Wednesday and Friday nights.     Allergies  Allergen Reactions  . Codeine   . Indocin [Indomethacin]   . Morphine And Related      REVIEW OF SYSTEMS:  GENERAL: no change in appetite, no fatigue, no weight changes, no fever, chills or weakness EYES: Denies change in vision, dry eyes, eye pain, itching or discharge EARS: Denies change in hearing, ringing in ears, or earache NOSE: Denies nasal congestion or epistaxis MOUTH and THROAT: Denies oral discomfort, gingival pain or bleeding, pain from teeth or hoarseness   RESPIRATORY: no cough, SOB, DOE, wheezing, hemoptysis CARDIAC: no chest pain, edema or palpitations GI: no abdominal pain, diarrhea, heart burn, nausea or vomiting, +constipation GU: Denies dysuria, frequency, hematuria, incontinence, or discharge PSYCHIATRIC: Denies feeling of depression or anxiety. No report of hallucinations, insomnia, paranoia, or agitation    PHYSICAL EXAMINATION  GENERAL APPEARANCE: Well nourished.  In no acute distress. Obese SKIN:  Skin is warm and dry.  HEAD: Normal in size and contour. No evidence of trauma EYES: Lids open and close normally. No blepharitis, entropion or ectropion. PERRL. Conjunctivae are clear and sclerae are white. Lenses are without opacity EARS: Pinnae are normal. Patient  hears normal voice tunes of the examiner MOUTH and THROAT: Lips are without lesions. Oral mucosa is moist and without lesions. Tongue is normal in shape, size, and color and without lesions NECK: supple, trachea midline, no neck masses, no thyroid tenderness, no thyromegaly LYMPHATICS: no LAN in the neck, no supraclavicular LAN RESPIRATORY: breathing is even & unlabored, BS CTAB CARDIAC: RRR, no murmur,no extra heart sounds, no edema GI: abdomen soft, normal BS, no masses, no tenderness, no hepatomegaly, no splenomegaly EXTREMITIES:  Able to move X 4 extremiteis PSYCHIATRIC: Alert to person, disoriented to time and place. Affect and behavior are appropriate  LABS/RADIOLOGY: Labs reviewed: Basic Metabolic Panel:  Recent Labs  16/10/96 01/28/16 03/10/16  NA 145 147 143  K 3.8 4.5 4.0  BUN 49* 15 20  CREATININE 2.0* 1.0 1.1   Liver Function Tests:  Recent Labs  09/04/15 12/03/15 03/10/16  AST 9* 13 11*  ALT 4* 5* 14  ALKPHOS 120 120 109   CBC:  Recent Labs  12/03/15 01/20/16 03/10/16  WBC 10.2 10.6 9.6  NEUTROABS HGB 13.0 14.1 12.0  HCT 40 44 37  PLT 286 299 273   Lipid Panel:  Recent Labs  09/04/15 12/03/15  HDL 37 32*    ASSESSMENT/PLAN:  Vaginal atrophy - continue Premarin vaginal cream 0.5 mg into the vaginal introitus every Monday, Wednesday and Friday night  Overactive bladder - continue oxybutynin 5 mg by mouth 3 times a day  Constipation - discontinue Colace and start Senna-S 8.6-50 mg 2 tabs BID  Bipolar depression - mood is stable; continue Lamictal 100 mg by mouth twice a day, Abilify 2 mg daily and Cymbalta 60 mg daily  Diabetes mellitus, type  II -continue Lantus to 30  units subcutaneous twice a day,  NovoLog 8 units subcutaneous twice a day when necessary for CBG >400 and Glimiperide  4 mg by mouth daily Lab Results  Component Value Date   HGBA1C 9.0 03/10/2016    GERD - continue Protonix 40 mg 1 tab by mouth BID  Parkinson's disease - stable; continue Sinemet 25/100 mg by mouth twice a day  Neuropathy - continue gabapentin 200 mg by mouth twice a day and Cymbalta 60 mg daily  Hypertension - controlled; continue losartan 50 mg daily and metoprolol tartrate 25 mg twice a day  Hyperlipidemia - continue atorvastatin 10 mg 1 tab by mouth daily Lab Results  Component Value Date   CHOL 135 12/03/2015   HDL 32* 12/03/2015   LDLCALC 77 12/03/2015   TRIG 133 12/03/2015       Protein calorie malnutrition - continue Procel 1 scoop by mouth twice a day  Allergic rhinitis - continue Claritin 10 mg by mouth daily     Goals of care:  Long-term care     Kenard Gower, NP [facility] [phone]

## 2016-04-05 DIAGNOSIS — Z79899 Other long term (current) drug therapy: Secondary | ICD-10-CM | POA: Diagnosis not present

## 2016-04-29 ENCOUNTER — Encounter: Payer: Self-pay | Admitting: Adult Health

## 2016-04-29 ENCOUNTER — Non-Acute Institutional Stay (SKILLED_NURSING_FACILITY): Payer: Medicare Other | Admitting: Adult Health

## 2016-04-29 DIAGNOSIS — K219 Gastro-esophageal reflux disease without esophagitis: Secondary | ICD-10-CM

## 2016-04-29 DIAGNOSIS — E1142 Type 2 diabetes mellitus with diabetic polyneuropathy: Secondary | ICD-10-CM

## 2016-04-29 DIAGNOSIS — M792 Neuralgia and neuritis, unspecified: Secondary | ICD-10-CM | POA: Diagnosis not present

## 2016-04-29 DIAGNOSIS — R300 Dysuria: Secondary | ICD-10-CM

## 2016-04-29 DIAGNOSIS — R600 Localized edema: Secondary | ICD-10-CM | POA: Diagnosis not present

## 2016-04-29 DIAGNOSIS — G20 Parkinson's disease: Secondary | ICD-10-CM | POA: Diagnosis not present

## 2016-04-29 DIAGNOSIS — F3130 Bipolar disorder, current episode depressed, mild or moderate severity, unspecified: Secondary | ICD-10-CM | POA: Diagnosis not present

## 2016-04-29 DIAGNOSIS — K5900 Constipation, unspecified: Secondary | ICD-10-CM

## 2016-04-29 DIAGNOSIS — K5909 Other constipation: Secondary | ICD-10-CM

## 2016-04-29 DIAGNOSIS — I10 Essential (primary) hypertension: Secondary | ICD-10-CM | POA: Diagnosis not present

## 2016-04-29 DIAGNOSIS — E785 Hyperlipidemia, unspecified: Secondary | ICD-10-CM

## 2016-04-29 DIAGNOSIS — Z794 Long term (current) use of insulin: Secondary | ICD-10-CM

## 2016-04-29 DIAGNOSIS — J309 Allergic rhinitis, unspecified: Secondary | ICD-10-CM | POA: Diagnosis not present

## 2016-04-29 DIAGNOSIS — N3281 Overactive bladder: Secondary | ICD-10-CM | POA: Diagnosis not present

## 2016-04-29 DIAGNOSIS — E46 Unspecified protein-calorie malnutrition: Secondary | ICD-10-CM

## 2016-04-29 NOTE — Progress Notes (Signed)
Patient ID: Maureen Taylor, female   DOB: 08/23/1935, 81 y.o.   MRN: [id number]    DATE:    04/29/16  MRN:  [id number]  BIRTHDAY: 02-Aug-1935  Facility:  Nursing Home Location:  [facility]  Nursing Home Room Number: 905-A  LEVEL OF CARE:  SNF [phone])  Contact Information    Name Relation Home Work Tustin L Daughter [phone]     Wallene Huh Daughter [phone]         Code Status History    This patient does not have a recorded code status. Please follow your organizational policy for patients in this situation.       Chief Complaint  Patient presents with  . Medical Management of Chronic Issues    HISTORY OF PRESENT ILLNESS:  This is a 81 year old female who is being seen for a routine visit. She is a long-term resident of [facility]. She is seen today in her room. She complains of constipation and dysuria. She was recently started on Premarin vaginal cream vaginally but she has been refusing them. Explained to her the importance of getting Premarin on a regular basis which can help her dysuria. She has agreed to continue using Premarin cream. She was recently discharged from OT.   PAST MEDICAL HISTORY:  Past Medical History:  Diagnosis Date  . Allergic rhinitis   . Ascorbic acid deficiency   . Bipolar affective disorder (HCC)   . Candidiasis   . Charcot's joint   . Chronic constipation   . Dysuria   . GERD (gastroesophageal reflux disease)   . History of falling   . HLD (hyperlipidemia)   . HTN (hypertension)   . Magnesium deficiency   . Major depressive disorder (HCC)   . Mixed incontinence   . Muscle weakness   . Neuropathic pain   . Neuropathy (HCC)   . OAB (overactive bladder)   . Obesity   . Oral pain   . Parkinson disease (HCC)   . Pneumonia   . Protein calorie malnutrition (HCC)   . Rosacea   . Slow transit constipation   . Type 2 diabetes mellitus with diabetic polyneuropathy, with long-term current  use of insulin (HCC)   . Urinary tract infection without hematuria   . Vaginal atrophy   . Vitamin deficiency      CURRENT MEDICATIONS: Reviewed  Patient's Medications  New Prescriptions   No medications on file  Previous Medications   ACETAMINOPHEN (TYLENOL) 325 MG TABLET    Take 650 mg by mouth as needed for mild pain.    ARIPIPRAZOLE (ABILIFY) 2 MG TABLET    Take 2 mg by mouth daily.   ASPIRIN 81 MG EC TABLET    Take 81 mg by mouth daily. Swallow whole.   ATORVASTATIN (LIPITOR) 10 MG TABLET    Take 10 mg by mouth daily.   CARBIDOPA-LEVODOPA (SINEMET IR) 25-100 MG TABLET    Take 1 tablet by mouth 2 (two) times daily.   CONJUGATED ESTROGENS (PREMARIN) VAGINAL CREAM    Place 1 Applicatorful vaginally daily. Apply 0.5mg  (pea-sized amount)  just inside the vaginal introitus with a finger-tip every night for two weeks and then Monday, Wednesday and Friday nights.   CRANBERRY 200 MG CAPS    Take 400 mg by mouth daily. Take two 200-mg capsules = 400 mg   DULOXETINE (CYMBALTA) 60 MG CAPSULE    Take 60 mg by mouth daily.   GABAPENTIN (NEURONTIN) 100 MG CAPSULE  Take 200 mg by mouth 2 (two) times daily. Take two 100 mg capsules to = 200 mg PO BID   GLIMEPIRIDE (AMARYL) 4 MG TABLET    Take 4 mg by mouth daily with breakfast.   INSULIN ASPART (NOVOLOG) 100 UNIT/ML INJECTION    Inject 8 Units into the skin 2 (two) times daily. If CBG >400, Give NOVOLOG 8 UNITS SQ BID PRN   INSULIN GLARGINE (LANTUS) 100 UNIT/ML INJECTION    Inject 30 Units into the skin 2 (two) times daily. QAM and QHS   LACTOBACILLUS (ACIDOPHILUS) CAPS CAPSULE    Take 1 capsule by mouth daily. 175 mg capsule   LAMOTRIGINE (LAMICTAL) 100 MG TABLET    Take 100 mg by mouth 2 (two) times daily.    LOPERAMIDE (IMODIUM) 2 MG CAPSULE    Take 2 tablets = 4mg  by mouth initially then 1 tablet =2mg  after each episode of diarrhea. DO NOT EXCEED 16MG  IN 24 HOUR PERIOD IF DIARRHEA PERSISTS MORE.   LORATADINE (CLARITIN) 10 MG TABLET    Take 10 mg  by mouth daily.    LOSARTAN (COZAAR) 50 MG TABLET    Take 50 mg by mouth daily.   MAGNESIUM OXIDE (MAG-OX) 400 MG TABLET    Take 400 mg by mouth daily.   METOPROLOL TARTRATE (LOPRESSOR) 25 MG TABLET    Take 25 mg by mouth 2 (two) times daily. Take 25 mg by mouth twice a day for HTN   MULTIPLE VITAMINS-MINERALS (THEREMS M PO)    Take 1 tablet by mouth daily.   OXYBUTYNIN (DITROPAN) 5 MG TABLET    Take 5 mg by mouth 3 (three) times daily.   PANTOPRAZOLE (PROTONIX) 40 MG TABLET    Take 40 mg by mouth 2 (two) times daily.    PROPYLENE GLYCOL (SYSTANE BALANCE OP)    Apply 1 drop to eye 2 (two) times daily. Both eyes   PROTEIN (PROCEL PO)    Take 1 scoop by mouth 2 (two) times daily.    SENNOSIDES-DOCUSATE SODIUM (SENOKOT-S) 8.6-50 MG TABLET    Take 2 tablets by mouth 2 (two) times daily.  Modified Medications   No medications on file  Discontinued Medications   DOCUSATE SODIUM (COLACE) 100 MG CAPSULE    Take 100 mg by mouth 2 (two) times daily.     Allergies  Allergen Reactions  . Codeine   . Indocin [Indomethacin]   . Morphine And Related      REVIEW OF SYSTEMS:  GENERAL: no change in appetite, no fatigue, no weight changes, no fever, chills or weakness EYES: Denies change in vision, dry eyes, eye pain, itching or discharge EARS: Denies change in hearing, ringing in ears, or earache NOSE: Denies nasal congestion or epistaxis MOUTH and THROAT: Denies oral discomfort, gingival pain or bleeding, pain from teeth or hoarseness   RESPIRATORY: no cough, SOB, DOE, wheezing, hemoptysis CARDIAC: no chest pain, or palpitations GI: no abdominal pain, diarrhea, heart burn, nausea or vomiting, +constipation GU: complains of dysuria PSYCHIATRIC: Denies feeling of depression or anxiety. No report of hallucinations, insomnia, paranoia, or agitation    PHYSICAL EXAMINATION  GENERAL APPEARANCE: Well nourished. In no acute distress. Obese SKIN:  Skin is warm and dry.  HEAD: Normal in size and  contour. No evidence of trauma EYES: Lids open and close normally. No blepharitis, entropion or ectropion. PERRL. Conjunctivae are clear and sclerae are white. Lenses are without opacity EARS: Pinnae are normal. Patient hears normal voice tunes of the examiner  MOUTH and THROAT: Lips are without lesions. Oral mucosa is moist and without lesions. Tongue is normal in shape, size, and color and without lesions NECK: supple, trachea midline, no neck masses, no thyroid tenderness, no thyromegaly LYMPHATICS: no LAN in the neck, no supraclavicular LAN RESPIRATORY: breathing is even & unlabored, BS CTAB CARDIAC: RRR, no murmur,no extra heart sounds, BLE edema 1+ GI: abdomen soft, normal BS, no masses, no tenderness, no hepatomegaly, no splenomegaly EXTREMITIES:  Able to move X 4 extremiteis PSYCHIATRIC: Alert and oriented X 3 today. Affect and behavior are appropriate  LABS/RADIOLOGY: Labs reviewed: Basic Metabolic Panel:  Recent Labs  96/04/54 01/28/16 03/10/16  NA 145 147 143  K 3.8 4.5 4.0  BUN 49* 15 20  CREATININE 2.0* 1.0 1.1   Liver Function Tests:  Recent Labs  09/04/15 12/03/15 03/10/16  AST 9* 13 11*  ALT 4* 5* 14  ALKPHOS 120 120 109   CBC:  Recent Labs  12/03/15 01/20/16 03/10/16  WBC 10.2 10.6 9.6  NEUTROABS HGB 13.0 14.1 12.0  HCT 40 44 37  PLT 286 299 273   Lipid Panel:  Recent Labs  09/04/15 12/03/15  HDL 37 32*    ASSESSMENT/PLAN:  BLE edema - encourage elevation of BLE; start bilateral teds, knee high, on in AM and off @ HS  Dysuria - continue Premarin vaginal cream 0.5 mg into the vaginal introitus every Monday, Wednesday and Friday night; she has been refusing Premarin and has explained to her the importance of Premarin. She has agreed to continue using it  Overactive bladder - continue oxybutynin 5 mg by mouth 3 times a day  Constipation - continue Senna-S 8.6-50 mg 2 tabs BID and start Miralax 17 gm BID and Dulcolax 10 mg suppository 1  rectally Q D PRN  Bipolar depression - mood is stable; continue Lamictal 100 mg by mouth twice a day, Abilify 2 mg daily and Cymbalta 60 mg daily  Diabetes mellitus, type II -continue Lantus to 30  units subcutaneous twice a day,  NovoLog 8 units subcutaneous twice a day when necessary for CBG >400 and Glimiperide  4 mg by mouth daily Lab Results  Component Value Date   HGBA1C 9.0 03/10/2016    GERD - continue Protonix 40 mg 1 tab by mouth BID  Parkinson's disease - stable; continue Sinemet 25/100 mg by mouth twice a day  Neuropathy - continue gabapentin 200 mg by mouth twice a day and Cymbalta 60 mg daily  Hypertension - controlled; continue losartan 50 mg daily and metoprolol tartrate 25 mg twice a day  Hyperlipidemia - continue atorvastatin 10 mg 1 tab by mouth daily Lab Results  Component Value Date   CHOL 135 12/03/2015   HDL 32 (A) 12/03/2015   LDLCALC 77 12/03/2015   TRIG 133 12/03/2015       Protein calorie malnutrition - albumin 3.4 ; continue Procel 1 scoop by mouth twice a day  Allergic rhinitis - continue Claritin 10 mg by mouth daily     Goals of care:  Long-term care     Kenard Gower, NP [facility] [phone]

## 2016-05-10 ENCOUNTER — Emergency Department (HOSPITAL_COMMUNITY): Payer: Medicare Other

## 2016-05-10 ENCOUNTER — Encounter (HOSPITAL_COMMUNITY): Payer: Self-pay | Admitting: *Deleted

## 2016-05-10 ENCOUNTER — Emergency Department (HOSPITAL_COMMUNITY)
Admission: EM | Admit: 2016-05-10 | Discharge: 2016-05-10 | Disposition: A | Discharge status: Home / Self Care | Payer: Medicare Other | Attending: Emergency Medicine | Admitting: Emergency Medicine

## 2016-05-10 DIAGNOSIS — I10 Essential (primary) hypertension: Secondary | ICD-10-CM | POA: Diagnosis not present

## 2016-05-10 DIAGNOSIS — T148XXA Other injury of unspecified body region, initial encounter: Secondary | ICD-10-CM

## 2016-05-10 DIAGNOSIS — S0990XA Unspecified injury of head, initial encounter: Secondary | ICD-10-CM | POA: Diagnosis not present

## 2016-05-10 DIAGNOSIS — Y929 Unspecified place or not applicable: Secondary | ICD-10-CM | POA: Diagnosis not present

## 2016-05-10 DIAGNOSIS — M25571 Pain in right ankle and joints of right foot: Secondary | ICD-10-CM | POA: Diagnosis not present

## 2016-05-10 DIAGNOSIS — W19XXXA Unspecified fall, initial encounter: Secondary | ICD-10-CM

## 2016-05-10 DIAGNOSIS — S199XXA Unspecified injury of neck, initial encounter: Secondary | ICD-10-CM | POA: Diagnosis not present

## 2016-05-10 DIAGNOSIS — R51 Headache: Secondary | ICD-10-CM | POA: Diagnosis not present

## 2016-05-10 DIAGNOSIS — Y999 Unspecified external cause status: Secondary | ICD-10-CM | POA: Diagnosis not present

## 2016-05-10 DIAGNOSIS — W0110XA Fall on same level from slipping, tripping and stumbling with subsequent striking against unspecified object, initial encounter: Secondary | ICD-10-CM | POA: Diagnosis not present

## 2016-05-10 DIAGNOSIS — E119 Type 2 diabetes mellitus without complications: Secondary | ICD-10-CM | POA: Diagnosis not present

## 2016-05-10 DIAGNOSIS — G44209 Tension-type headache, unspecified, not intractable: Secondary | ICD-10-CM | POA: Diagnosis not present

## 2016-05-10 DIAGNOSIS — S161XXA Strain of muscle, fascia and tendon at neck level, initial encounter: Secondary | ICD-10-CM | POA: Diagnosis not present

## 2016-05-10 DIAGNOSIS — R110 Nausea: Secondary | ICD-10-CM | POA: Insufficient documentation

## 2016-05-10 DIAGNOSIS — S93401A Sprain of unspecified ligament of right ankle, initial encounter: Secondary | ICD-10-CM | POA: Insufficient documentation

## 2016-05-10 DIAGNOSIS — Y9301 Activity, walking, marching and hiking: Secondary | ICD-10-CM | POA: Diagnosis not present

## 2016-05-10 DIAGNOSIS — T148 Other injury of unspecified body region: Secondary | ICD-10-CM | POA: Diagnosis not present

## 2016-05-10 DIAGNOSIS — S098XXA Other specified injuries of head, initial encounter: Secondary | ICD-10-CM | POA: Diagnosis not present

## 2016-05-10 DIAGNOSIS — M542 Cervicalgia: Secondary | ICD-10-CM | POA: Diagnosis present

## 2016-05-10 DIAGNOSIS — S300XXA Contusion of lower back and pelvis, initial encounter: Secondary | ICD-10-CM | POA: Diagnosis not present

## 2016-05-10 MED ORDER — ONDANSETRON 4 MG PO TBDP
4.0000 mg | ORAL_TABLET | Freq: Once | ORAL | Status: AC
  Administered 2016-05-10: 4 mg via ORAL
  Filled 2016-05-10: qty 1

## 2016-05-10 MED ORDER — HYDROCODONE-ACETAMINOPHEN 5-325 MG PO TABS
1.0000 | ORAL_TABLET | Freq: Once | ORAL | Status: AC
  Administered 2016-05-10: 1 via ORAL
  Filled 2016-05-10: qty 1

## 2016-05-10 NOTE — ED Notes (Signed)
Patient transported to X-ray 

## 2016-05-10 NOTE — ED Provider Notes (Signed)
[facility]-EMERGENCY DEPT Provider Note   CSN: [id number] Arrival date & time: 05/10/16  [id number]     History   Chief Complaint Chief Complaint  Patient presents with  . Fall    HPI Maureen Taylor is a 81 y.o. female.  HPI  81 year old female presents with concern of mechanical fall from standing. Reports that she was walking towards her closet, and slipped and fell, rolling her ankle, and hitting the right side of her head. Patient reports headache which is a 5 out of 10, right ankle pain. Reports she is unable to m get up independently after this incident.  Has right-sided neck pain. Denies numbness or weakness. Denies loss of consciousness. Incident happened just prior to arrival. Denies any recent illnesses, urinary symptoms, cough, or fever. Denies chest or abdomen injury. Reports she has some pain in her left buttock.  Past Medical History:  Diagnosis Date  . Allergic rhinitis   . Ascorbic acid deficiency   . Bipolar affective disorder (HCC)   . Candidiasis   . Charcot's joint   . Chronic constipation   . Dysuria   . GERD (gastroesophageal reflux disease)   . History of falling   . HLD (hyperlipidemia)   . HTN (hypertension)   . Magnesium deficiency   . Major depressive disorder (HCC)   . Mixed incontinence   . Muscle weakness   . Neuropathic pain   . Neuropathy (HCC)   . OAB (overactive bladder)   . Obesity   . Oral pain   . Parkinson disease (HCC)   . Pneumonia   . Protein calorie malnutrition (HCC)   . Rosacea   . Slow transit constipation   . Type 2 diabetes mellitus with diabetic polyneuropathy, with long-term current use of insulin (HCC)   . Urinary tract infection without hematuria   . Vaginal atrophy   . Vitamin deficiency     Patient Active Problem List   Diagnosis Date Noted  . Hyperlipidemia 06/05/2015  . Type 2 diabetes mellitus with diabetic polyneuropathy (HCC) 06/05/2015  . Vitamin D deficiency 06/05/2015  . Recurrent UTI 04/17/2015  . Vaginal  atrophy 04/17/2015  . Incontinence 04/17/2015  . OAB (overactive bladder) 03/03/2015  . Bipolar depression (HCC) 01/05/2015  . GERD (gastroesophageal reflux disease) 01/05/2015  . Allergic rhinitis 01/05/2015  . Essential hypertension 01/05/2015  . Oral thrush 01/05/2015  . Parkinson's disease (HCC) 01/05/2015  . Neuropathy (HCC) 01/05/2015  . Diabetes mellitus with neuropathy (HCC) 01/05/2015    Past Surgical History:  Procedure Laterality Date  . ABDOMINAL HYSTERECTOMY    . CERVICAL CONIZATION W/BX    . CHOLECYSTECTOMY    . REVISION TOTAL KNEE ARTHROPLASTY Right     OB History    No data available       Home Medications    Prior to Admission medications   Medication Sig Start Date End Date Taking? Authorizing Provider  acetaminophen (TYLENOL) 325 MG tablet Take 650 mg by mouth as needed for mild pain.    Yes Historical Provider, MD  acidophilus (RISAQUAD) CAPS capsule Take 1 capsule by mouth daily.   Yes Historical Provider, MD  ARIPiprazole (ABILIFY) 2 MG tablet Take 2 mg by mouth daily.   Yes Historical Provider, MD  aspirin 81 MG EC tablet Take 81 mg by mouth daily. Swallow whole.   Yes Historical Provider, MD  atorvastatin (LIPITOR) 10 MG tablet Take 10 mg by mouth daily.   Yes Historical Provider, MD  bisacodyl (DULCOLAX) 10 MG suppository Place  10 mg rectally daily as needed for moderate constipation.   Yes Historical Provider, MD  carbidopa-levodopa (PARCOPA) 25-100 MG disintegrating tablet Take 1 tablet by mouth 2 (two) times daily.   Yes Historical Provider, MD  conjugated estrogens (PREMARIN) vaginal cream Place 1 Applicatorful vaginally daily. Apply 0.5mg  (pea-sized amount)  just inside the vaginal introitus with a finger-tip every night for two weeks and then Monday, Wednesday and Friday nights. Patient taking differently: Place 1 Applicatorful vaginally every Monday, Wednesday, and Friday. Apply 0.5mg  (pea-sized amount)  just inside the vaginal introitus with a  finger-tip every night on Monday, Wednesday and Friday nights. 02/25/16  Yes Shannon A McGowan, PA-C  Cranberry 200 MG CAPS Take 400 mg by mouth daily. Take two 200-mg capsules = 400 mg   Yes Historical Provider, MD  DULoxetine (CYMBALTA) 60 MG capsule Take 60 mg by mouth daily.   Yes Historical Provider, MD  gabapentin (NEURONTIN) 100 MG capsule Take 200 mg by mouth 2 (two) times daily. Take two 100 mg capsules to = 200 mg PO BID   Yes Historical Provider, MD  glimepiride (AMARYL) 4 MG tablet Take 4 mg by mouth daily with breakfast.   Yes Historical Provider, MD  insulin aspart (NOVOLOG) 100 UNIT/ML injection Inject 8 Units into the skin 2 (two) times daily as needed for high blood sugar. If CBG >400, Give NOVOLOG 8 UNITS SQ BID PRN    Yes Historical Provider, MD  insulin glargine (LANTUS) 100 UNIT/ML injection Inject 30 Units into the skin 2 (two) times daily. QAM and QHS   Yes Historical Provider, MD  lamoTRIgine (LAMICTAL) 100 MG tablet Take 100 mg by mouth 2 (two) times daily.    Yes Historical Provider, MD  loperamide (IMODIUM) 2 MG capsule Take 2 tablets = 4mg  by mouth initially then 1 tablet =2mg  after each episode of diarrhea. DO NOT EXCEED 16MG  IN 24 HOUR PERIOD IF DIARRHEA PERSISTS MORE.   Yes Historical Provider, MD  loratadine (CLARITIN) 10 MG tablet Take 10 mg by mouth daily.    Yes Historical Provider, MD  losartan (COZAAR) 50 MG tablet Take 50 mg by mouth daily.   Yes Historical Provider, MD  magnesium oxide (MAG-OX) 400 MG tablet Take 400 mg by mouth daily.   Yes Historical Provider, MD  metoprolol tartrate (LOPRESSOR) 25 MG tablet Take 25 mg by mouth 2 (two) times daily. Take 25 mg by mouth twice a day for HTN   Yes Historical Provider, MD  Multiple Vitamins-Minerals (THEREMS M PO) Take 1 tablet by mouth daily.   Yes Historical Provider, MD  oxybutynin (DITROPAN) 5 MG tablet Take 5 mg by mouth 3 (three) times daily.   Yes Historical Provider, MD  pantoprazole (PROTONIX) 40 MG tablet  Take 40 mg by mouth 2 (two) times daily.    Yes Historical Provider, MD  polyethylene glycol (MIRALAX / GLYCOLAX) packet Take 17 g by mouth 2 (two) times daily.   Yes Historical Provider, MD  Propylene Glycol (SYSTANE BALANCE OP) Apply 1 drop to eye 2 (two) times daily. Both eyes   Yes Historical Provider, MD  Protein (PROCEL PO) Take 1 scoop by mouth 2 (two) times daily.    Yes Historical Provider, MD  sennosides-docusate sodium (SENOKOT-S) 8.6-50 MG tablet Take 2 tablets by mouth 2 (two) times daily.   Yes Historical Provider, MD  Lactobacillus (ACIDOPHILUS) CAPS capsule Take 1 capsule by mouth daily. 175 mg capsule    Historical Provider, MD    Family History Family  History  Problem Relation Age of Onset  . Kidney disease Neg Hx   . Bladder Cancer Neg Hx   . Prostate cancer Neg Hx     Social History Social History  Substance Use Topics  . Smoking status: Never Smoker  . Smokeless tobacco: Never Used  . Alcohol use No     Allergies   Codeine; Morphine and related; and Indocin [indomethacin]   Review of Systems Review of Systems  Constitutional: Negative for fever.  HENT: Negative for sore throat.   Eyes: Negative for visual disturbance.  Respiratory: Negative for cough and shortness of breath.   Cardiovascular: Negative for chest pain.  Gastrointestinal: Positive for nausea. Negative for abdominal pain and vomiting.  Genitourinary: Negative for difficulty urinating.  Musculoskeletal: Positive for back pain (left buttock) and neck pain.  Skin: Negative for rash.  Neurological: Positive for headaches. Negative for syncope.     Physical Exam Updated Vital Signs BP 144/69 (BP Location: Left Arm)   Pulse 85   Temp 97.8 F (36.6 C) (Oral)   Resp 18   SpO2 93%   Physical Exam  Constitutional: She is oriented to person, place, and time. She appears well-developed and well-nourished. No distress.  HENT:  Head: Normocephalic and atraumatic.  Eyes: Conjunctivae and EOM  are normal.  Neck: Normal range of motion. Muscular tenderness present. No spinous process tenderness present.  Cardiovascular: Normal rate, regular rhythm, normal heart sounds and intact distal pulses.  Exam reveals no gallop and no friction rub.   No murmur heard. Pulmonary/Chest: Effort normal and breath sounds normal. No respiratory distress. She has no wheezes. She has no rales.  Abdominal: Soft. She exhibits no distension. There is no tenderness. There is no guarding.  Musculoskeletal: She exhibits no edema.       Right ankle: She exhibits normal range of motion, no swelling, no ecchymosis, no deformity, no laceration and normal pulse. Tenderness. Lateral malleolus tenderness found.       Left ankle: She exhibits normal range of motion, no swelling, no ecchymosis and no deformity.       Thoracic back: She exhibits no bony tenderness.       Lumbar back: She exhibits no bony tenderness.  Tenderness left buttock   Neurological: She is alert and oriented to person, place, and time.  Skin: Skin is warm and dry. No rash noted. She is not diaphoretic. No erythema.  Venous stasis changes bilat lower ext   Nursing note and vitals reviewed.    ED Treatments / Results  Labs (all labs ordered are listed, but only abnormal results are displayed) Labs Reviewed - No data to display  EKG  EKG Interpretation None       Radiology No results found.  Procedures Procedures (including critical care time)  Medications Ordered in ED Medications  HYDROcodone-acetaminophen (NORCO/VICODIN) 5-325 MG per tablet 1 tablet (1 tablet Oral Given 05/10/16 1003)     Initial Impression / Assessment and Plan / ED Course  I have reviewed the triage vital signs and the nursing notes.  Pertinent labs & imaging results that were available during my care of the patient were reviewed by me and considered in my medical decision making (see chart for details).  Clinical Course    81 year old female with  a history of bipolar, hypertension, hyperlipidemia, diabetes presents with concern for mechanical fall from standing with headache, right neck pain, and right ankle pain. Patient and family deny any change from baseline, or symptoms of acute  illness. Given norco for pain in ED. Patient is neurologically intact. Has left buttock tenderness but good ROM or hip and no pelvis or midline back tenderness and doubt acute hip/pelvis/lumbar injury. CT head and cervical spine shows no acute findings. Patient likely with a muscular strain. X-ray of the right ankle shows no acute fx, and suspect ankle sprain. Given ankle air cast, recommend weightbearing as tolerated.  Prior to discharge, and contrary to initial history, patient does report she is been having months of dysuria. Discussed possibility of checking urinalysis here in the emergency department, however family wishes to have this done at [facility] place. Given patient's hemodynamic stability, no vomiting, feel that their preference of outpatient evaluation for UTI is appropriate. Patient discharged in stable condition with understanding of reasons to return.   Final Clinical Impressions(s) / ED Diagnoses   Final diagnoses:  None    New Prescriptions New Prescriptions   No medications on file     Alvira Monday, MD 05/11/16 [phone]

## 2016-05-10 NOTE — ED Notes (Signed)
Patient transported to CT 

## 2016-05-10 NOTE — ED Triage Notes (Signed)
Per EMS[facility]  Reports pt was walking from her closet back to her bed, slipped and fell.  Hit head without LOC.  C/o R ankle, RUQ, R shoulder and R head pain.  Pt is A&O per norm.

## 2016-05-10 NOTE — ED Notes (Signed)
Bed: [id number] Expected date:  Expected time:  Means of arrival:  Comments: EMS- 81yo F, unwitnessed fall

## 2016-06-01 DIAGNOSIS — F329 Major depressive disorder, single episode, unspecified: Secondary | ICD-10-CM | POA: Diagnosis not present

## 2016-06-01 DIAGNOSIS — F0390 Unspecified dementia without behavioral disturbance: Secondary | ICD-10-CM | POA: Diagnosis not present

## 2016-06-01 DIAGNOSIS — F3130 Bipolar disorder, current episode depressed, mild or moderate severity, unspecified: Secondary | ICD-10-CM | POA: Diagnosis not present

## 2016-06-02 ENCOUNTER — Non-Acute Institutional Stay (SKILLED_NURSING_FACILITY): Payer: Medicare Other | Admitting: Adult Health

## 2016-06-02 ENCOUNTER — Encounter: Payer: Self-pay | Admitting: Adult Health

## 2016-06-02 DIAGNOSIS — G20 Parkinson's disease: Secondary | ICD-10-CM

## 2016-06-02 DIAGNOSIS — R2681 Unsteadiness on feet: Secondary | ICD-10-CM | POA: Diagnosis not present

## 2016-06-02 DIAGNOSIS — E1142 Type 2 diabetes mellitus with diabetic polyneuropathy: Secondary | ICD-10-CM | POA: Diagnosis not present

## 2016-06-02 DIAGNOSIS — E785 Hyperlipidemia, unspecified: Secondary | ICD-10-CM | POA: Diagnosis not present

## 2016-06-02 DIAGNOSIS — I10 Essential (primary) hypertension: Secondary | ICD-10-CM

## 2016-06-02 DIAGNOSIS — G629 Polyneuropathy, unspecified: Secondary | ICD-10-CM | POA: Diagnosis not present

## 2016-06-02 DIAGNOSIS — K5900 Constipation, unspecified: Secondary | ICD-10-CM | POA: Diagnosis not present

## 2016-06-02 DIAGNOSIS — N3281 Overactive bladder: Secondary | ICD-10-CM

## 2016-06-02 DIAGNOSIS — K219 Gastro-esophageal reflux disease without esophagitis: Secondary | ICD-10-CM

## 2016-06-02 DIAGNOSIS — R300 Dysuria: Secondary | ICD-10-CM | POA: Diagnosis not present

## 2016-06-02 DIAGNOSIS — J309 Allergic rhinitis, unspecified: Secondary | ICD-10-CM

## 2016-06-02 DIAGNOSIS — Z794 Long term (current) use of insulin: Secondary | ICD-10-CM

## 2016-06-02 DIAGNOSIS — E46 Unspecified protein-calorie malnutrition: Secondary | ICD-10-CM | POA: Diagnosis not present

## 2016-06-02 DIAGNOSIS — K5909 Other constipation: Secondary | ICD-10-CM

## 2016-06-02 DIAGNOSIS — G20A1 Parkinson's disease without dyskinesia, without mention of fluctuations: Secondary | ICD-10-CM

## 2016-06-02 DIAGNOSIS — F3130 Bipolar disorder, current episode depressed, mild or moderate severity, unspecified: Secondary | ICD-10-CM | POA: Diagnosis not present

## 2016-06-02 DIAGNOSIS — F319 Bipolar disorder, unspecified: Secondary | ICD-10-CM

## 2016-06-02 NOTE — Progress Notes (Signed)
Patient ID: Maureen Taylor, female   DOB: 1935/06/13, 81 y.o.   MRN: [id number]    DATE:    06/02/16  MRN:  [id number]  BIRTHDAY: 02-Nov-1934  Facility:  Nursing Home Location:  [facility]  Nursing Home Room Number: 905-A  LEVEL OF CARE:  SNF [phone])  Contact Information    Name Relation Home Work [place] L Daughter [phone]     Wallene Huh Daughter [phone]         Code Status History    This patient does not have a recorded code status. Please follow your organizational policy for patients in this situation.       Chief Complaint  Patient presents with  . Medical Management of Chronic Issues    HISTORY OF PRESENT ILLNESS:  This is a 81 year old female who is being seen for a routine visit. She is a long-term resident of [facility]. She is currently having PT for lower extremity strengthening exercise to improve balance/gait. Losartan was changed to Benazepril.   PAST MEDICAL HISTORY:  Past Medical History:  Diagnosis Date  . Allergic rhinitis   . Ascorbic acid deficiency   . Bipolar affective disorder (HCC)   . Candidiasis   . Charcot's joint   . Chronic constipation   . Dysuria   . GERD (gastroesophageal reflux disease)   . History of falling   . HLD (hyperlipidemia)   . HTN (hypertension)   . Magnesium deficiency   . Major depressive disorder (HCC)   . Mixed incontinence   . Muscle weakness   . Neuropathic pain   . Neuropathy (HCC)   . OAB (overactive bladder)   . Obesity   . Oral pain   . Parkinson disease (HCC)   . Pneumonia   . Protein calorie malnutrition (HCC)   . Rosacea   . Slow transit constipation   . Type 2 diabetes mellitus with diabetic polyneuropathy, with long-term current use of insulin (HCC)   . Urinary tract infection without hematuria   . Vaginal atrophy   . Vitamin deficiency      CURRENT MEDICATIONS: Reviewed  Patient's Medications  New Prescriptions   No medications on file   Previous Medications   ACETAMINOPHEN (TYLENOL) 325 MG TABLET    Take 650 mg by mouth as needed for mild pain.    ACIDOPHILUS (RISAQUAD) CAPS CAPSULE    Take 1 capsule by mouth daily.   ARIPIPRAZOLE (ABILIFY) 2 MG TABLET    Take 2 mg by mouth daily.   ASPIRIN 81 MG EC TABLET    Take 81 mg by mouth daily. Swallow whole.   ATORVASTATIN (LIPITOR) 10 MG TABLET    Take 10 mg by mouth daily.   BENAZEPRIL (LOTENSIN) 20 MG TABLET    Take 20 mg by mouth daily.   BISACODYL (DULCOLAX) 10 MG SUPPOSITORY    Place 10 mg rectally daily as needed for moderate constipation.   CARBIDOPA-LEVODOPA (PARCOPA) 25-100 MG DISINTEGRATING TABLET    Take 1 tablet by mouth 2 (two) times daily.    CONJUGATED ESTROGENS (PREMARIN) VAGINAL CREAM    Place 1 Applicatorful vaginally daily. Apply 0.5mg  (pea-sized amount)  just inside the vaginal introitus with a finger-tip every night for two weeks and then Monday, Wednesday and Friday nights.   CRANBERRY 200 MG CAPS    Take 400 mg by mouth daily. Take two 200-mg capsules = 400 mg   DULOXETINE (CYMBALTA) 60 MG CAPSULE    Take 60 mg  by mouth daily.   GABAPENTIN (NEURONTIN) 100 MG CAPSULE    Take 200 mg by mouth 2 (two) times daily. Take two 100 mg capsules to = 200 mg PO BID   GLIMEPIRIDE (AMARYL) 4 MG TABLET    Take 4 mg by mouth daily with breakfast.   INSULIN ASPART (NOVOLOG) 100 UNIT/ML INJECTION    Inject 8 Units into the skin 2 (two) times daily as needed for high blood sugar. If CBG >400, Give NOVOLOG 8 UNITS SQ BID PRN    INSULIN GLARGINE (LANTUS) 100 UNIT/ML INJECTION    Inject 30 Units into the skin 2 (two) times daily. QAM and QHS   LACTOBACILLUS (ACIDOPHILUS) CAPS CAPSULE    Take 1 capsule by mouth daily. 175 mg capsule   LAMOTRIGINE (LAMICTAL) 100 MG TABLET    Take 100 mg by mouth 2 (two) times daily.    LOPERAMIDE (IMODIUM) 2 MG CAPSULE    Take 2 tablets = 4mg  by mouth initially then 1 tablet =2mg  after each episode of diarrhea. DO NOT EXCEED 16MG  IN 24 HOUR PERIOD IF  DIARRHEA PERSISTS MORE.   LORATADINE (CLARITIN) 10 MG TABLET    Take 10 mg by mouth daily.    MAGNESIUM OXIDE (MAG-OX) 400 MG TABLET    Take 400 mg by mouth daily.   METOPROLOL TARTRATE (LOPRESSOR) 25 MG TABLET    Take 25 mg by mouth 2 (two) times daily. Take 25 mg by mouth twice a day for HTN   MULTIPLE VITAMINS-MINERALS (THEREMS M PO)    Take 1 tablet by mouth daily.   OXYBUTYNIN (DITROPAN) 5 MG TABLET    Take 5 mg by mouth 3 (three) times daily.   PANTOPRAZOLE (PROTONIX) 40 MG TABLET    Take 40 mg by mouth 2 (two) times daily.    POLYETHYLENE GLYCOL (MIRALAX / GLYCOLAX) PACKET    Take 17 g by mouth 2 (two) times daily.   PROPYLENE GLYCOL (SYSTANE BALANCE OP)    Apply 1 drop to eye 2 (two) times daily. Both eyes   PROTEIN (PROCEL PO)    Take 1 scoop by mouth 2 (two) times daily.    SENNOSIDES-DOCUSATE SODIUM (SENOKOT-S) 8.6-50 MG TABLET    Take 2 tablets by mouth 2 (two) times daily.  Modified Medications   No medications on file  Discontinued Medications   LOSARTAN (COZAAR) 50 MG TABLET    Take 50 mg by mouth daily.     Allergies  Allergen Reactions  . Codeine Itching  . Morphine And Related Itching  . Indocin [Indomethacin] Rash     REVIEW OF SYSTEMS:  GENERAL: no change in appetite, no fatigue, no weight changes, no fever, chills or weakness EYES: Denies change in vision, dry eyes, eye pain, itching or discharge EARS: Denies change in hearing, ringing in ears, or earache NOSE: Denies nasal congestion or epistaxis MOUTH and THROAT: Denies oral discomfort, gingival pain or bleeding, pain from teeth or hoarseness   RESPIRATORY: no cough, SOB, DOE, wheezing, hemoptysis CARDIAC: no chest pain, or palpitations GI: no abdominal pain, diarrhea, heart burn, nausea or vomiting, +constipation GU: complains of dysuria PSYCHIATRIC: Denies feeling of depression or anxiety. No report of hallucinations, insomnia, paranoia, or agitation    PHYSICAL EXAMINATION  GENERAL APPEARANCE: Well  nourished. In no acute distress. Obese SKIN:  Skin is warm and dry.  HEAD: Normal in size and contour. No evidence of trauma EYES: Lids open and close normally. No blepharitis, entropion or ectropion. PERRL. Conjunctivae are clear  and sclerae are white. Lenses are without opacity EARS: Pinnae are normal. Patient hears normal voice tunes of the examiner MOUTH and THROAT: Lips are without lesions. Oral mucosa is moist and without lesions. Tongue is normal in shape, size, and color and without lesions NECK: supple, trachea midline, no neck masses, no thyroid tenderness, no thyromegaly RESPIRATORY: breathing is even & unlabored, BS CTAB CARDIAC: RRR, no murmur,no extra heart sounds, BLE edema 1+ GI: abdomen soft, normal BS, no masses, no tenderness, no hepatomegaly, no splenomegaly EXTREMITIES:  Able to move X 4 extremiteis PSYCHIATRIC: Alert and oriented X 3 today. Affect and behavior are appropriate  LABS/RADIOLOGY: Labs reviewed: Basic Metabolic Panel:  Recent Labs  29/52/84 01/28/16 03/10/16  NA 145 147 143  K 3.8 4.5 4.0  BUN 49* 15 20  CREATININE 2.0* 1.0 1.1   Liver Function Tests:  Recent Labs  09/04/15 12/03/15 03/10/16  AST 9* 13 11*  ALT 4* 5* 14  ALKPHOS 120 120 109   CBC:  Recent Labs  12/03/15 01/20/16 03/10/16  WBC 10.2 10.6 9.6  NEUTROABS 6 6 5   HGB 13.0 14.1 12.0  HCT 40 44 37  PLT 286 299 273   Lipid Panel:  Recent Labs  09/04/15 12/03/15  HDL 37 32*    ASSESSMENT/PLAN:  Unsteady gait - continue PT for therapeutic strengthening exercises for gait/balance  Dysuria - continue Premarin vaginal cream 0.5 mg into the vaginal introitus every Monday, Wednesday and Friday night  Overactive bladder - continue oxybutynin 5 mg by mouth 3 times a day  Constipation - continue Senna-S 8.6-50 mg 2 tabs BID and start Miralax 17 gm BID and Dulcolax 10 mg suppository 1 rectally Q D PRN  Bipolar depression - mood is stable; continue Lamictal 100 mg by mouth twice  a day, Abilify 2 mg daily and Cymbalta 60 mg daily  Diabetes mellitus, type II -continue Lantus to 30  units subcutaneous twice a day,  NovoLog 8 units subcutaneous twice a day when necessary for CBG >400 and Glimiperide  4 mg by mouth daily Lab Results  Component Value Date   HGBA1C 9.0 03/10/2016    GERD - stable; continue Protonix 40 mg 1 tab by mouth BID  Parkinson's disease - stable; continue Sinemet 25/100 mg by mouth twice a day  Neuropathy - continue gabapentin 200 mg by mouth twice a day and Cymbalta 60 mg daily  Hypertension - controlled; continue Benazepril 20 mg 1 tab daily and metoprolol tartrate 25 mg twice a day  Hyperlipidemia - continue atorvastatin 10 mg 1 tab by mouth daily Lab Results  Component Value Date   CHOL 135 12/03/2015   HDL 32 (A) 12/03/2015   LDLCALC 77 12/03/2015   TRIG 133 12/03/2015       Protein calorie malnutrition - albumin 3.4 ; continue Procel 1 scoop by mouth twice a day  Allergic rhinitis - continue Claritin 10 mg by mouth daily     Goals of care:  Long-term care     Kenard Gower, NP [facility] [phone]

## 2016-06-15 DIAGNOSIS — E119 Type 2 diabetes mellitus without complications: Secondary | ICD-10-CM | POA: Diagnosis not present

## 2016-06-15 LAB — MICROALBUMIN, URINE: Microalb, Ur: 100.1

## 2016-07-01 ENCOUNTER — Non-Acute Institutional Stay (SKILLED_NURSING_FACILITY): Payer: Medicare Other | Admitting: Internal Medicine

## 2016-07-01 ENCOUNTER — Encounter: Payer: Self-pay | Admitting: Internal Medicine

## 2016-07-01 DIAGNOSIS — K219 Gastro-esophageal reflux disease without esophagitis: Secondary | ICD-10-CM

## 2016-07-01 DIAGNOSIS — E1140 Type 2 diabetes mellitus with diabetic neuropathy, unspecified: Secondary | ICD-10-CM

## 2016-07-01 DIAGNOSIS — Z794 Long term (current) use of insulin: Secondary | ICD-10-CM

## 2016-07-01 DIAGNOSIS — E782 Mixed hyperlipidemia: Secondary | ICD-10-CM | POA: Diagnosis not present

## 2016-07-01 DIAGNOSIS — F319 Bipolar disorder, unspecified: Secondary | ICD-10-CM

## 2016-07-01 DIAGNOSIS — M792 Neuralgia and neuritis, unspecified: Secondary | ICD-10-CM | POA: Diagnosis not present

## 2016-07-01 DIAGNOSIS — G20 Parkinson's disease: Secondary | ICD-10-CM | POA: Diagnosis not present

## 2016-07-01 DIAGNOSIS — H04123 Dry eye syndrome of bilateral lacrimal glands: Secondary | ICD-10-CM

## 2016-07-01 DIAGNOSIS — F3130 Bipolar disorder, current episode depressed, mild or moderate severity, unspecified: Secondary | ICD-10-CM | POA: Diagnosis not present

## 2016-07-01 NOTE — Progress Notes (Signed)
Patient ID: Maureen Taylor, female   DOB: 10-Nov-1934, 81 y.o.   MRN: [id number]     [facility]   PCP: Oneal Grout, MD  Code Status: Full Code  Allergies  Allergen Reactions  . Codeine Itching  . Morphine And Related Itching  . Indocin [Indomethacin] Rash    Chief Complaint  Patient presents with  . Medical Management of Chronic Issues    Routine Visit    Advanced Directives 05/10/2016  Does patient have an advance directive? No     HPI:  81 year old patient is seen for routine visit in her room. She is in no distress. She denies any concern this visit.   Review of Systems:  Constitutional: Negative for fever, chills, diaphoresis.  HENT: Negative for headache, congestion, nasal discharge Eyes: Negative for double vision and discharge.  Respiratory: Negative for cough, shortness of breath and wheezing.   Cardiovascular: Negative for chest pain, palpitations, leg swelling.  Gastrointestinal: Negative for nausea, vomiting, abdominal pain, heartburn Musculoskeletal: Negative for back pain, recent falls Skin: Negative for itching, rash.  Neurological: Negative for dizziness Psychiatric/Behavioral: mood has been stable   Past Medical History:  Diagnosis Date  . Allergic rhinitis   . Ascorbic acid deficiency   . Bipolar affective disorder (HCC)   . Candidiasis   . Charcot's joint   . Chronic constipation   . Dysuria   . GERD (gastroesophageal reflux disease)   . History of falling   . HLD (hyperlipidemia)   . HTN (hypertension)   . Magnesium deficiency   . Major depressive disorder   . Mixed incontinence   . Muscle weakness   . Neuropathic pain   . Neuropathy (HCC)   . OAB (overactive bladder)   . Obesity   . Oral pain   . Parkinson disease (HCC)   . Pneumonia   . Protein calorie malnutrition (HCC)   . Rosacea   . Slow transit constipation   . Type 2 diabetes mellitus with diabetic polyneuropathy, with long-term  current use of insulin (HCC)   . Urinary tract infection without hematuria   . Vaginal atrophy   . Vitamin deficiency      Medications: Patient's Medications  New Prescriptions   No medications on file  Previous Medications   ACETAMINOPHEN (TYLENOL) 325 MG TABLET    Take 650 mg by mouth as needed for mild pain.    ARIPIPRAZOLE (ABILIFY) 2 MG TABLET    Take 2 mg by mouth daily.   ASPIRIN 81 MG EC TABLET    Take 81 mg by mouth daily. Swallow whole.   ATORVASTATIN (LIPITOR) 10 MG TABLET    Take 10 mg by mouth daily.   BENAZEPRIL (LOTENSIN) 20 MG TABLET    Take 20 mg by mouth daily.   BISACODYL (DULCOLAX) 10 MG SUPPOSITORY    Place 10 mg rectally daily as needed for moderate constipation.   CARBIDOPA-LEVODOPA (PARCOPA) 25-100 MG DISINTEGRATING TABLET    Take 1 tablet by mouth 2 (two) times daily.    CONJUGATED ESTROGENS (PREMARIN) VAGINAL CREAM    Place 1 Applicatorful vaginally daily. Apply 0.5mg  (pea-sized amount)  just inside the vaginal introitus with a finger-tip every night for two weeks and then Monday, Wednesday and Friday nights.   CRANBERRY 200 MG CAPS    Take 400 mg by mouth daily. Take two 200-mg capsules = 400 mg   DULOXETINE (CYMBALTA) 60 MG CAPSULE    Take 60 mg by mouth daily.  GABAPENTIN (NEURONTIN) 100 MG CAPSULE    Take 200 mg by mouth 2 (two) times daily. Take two 100 mg capsules to = 200 mg PO BID   GLIMEPIRIDE (AMARYL) 4 MG TABLET    Take 4 mg by mouth daily with breakfast.   INSULIN ASPART (NOVOLOG) 100 UNIT/ML INJECTION    Inject 8 Units into the skin 2 (two) times daily as needed for high blood sugar. If CBG >400, Give NOVOLOG 8 UNITS SQ BID PRN    INSULIN GLARGINE (LANTUS) 100 UNIT/ML INJECTION    Inject 30 Units into the skin 2 (two) times daily. QAM and QHS   LACTOBACILLUS (ACIDOPHILUS) CAPS CAPSULE    Take 1 capsule by mouth daily. 175 mg capsule   LAMOTRIGINE (LAMICTAL) 100 MG TABLET    Take 100 mg by mouth 2 (two) times daily.    LOPERAMIDE (IMODIUM) 2 MG  CAPSULE    Take 2 tablets = 4mg  by mouth initially then 1 tablet =2mg  after each episode of diarrhea. DO NOT EXCEED 16MG  IN 24 HOUR PERIOD IF DIARRHEA PERSISTS MORE.   LORATADINE (CLARITIN) 10 MG TABLET    Take 10 mg by mouth daily.    MAGNESIUM OXIDE (MAG-OX) 400 MG TABLET    Take 400 mg by mouth daily.   METOPROLOL TARTRATE (LOPRESSOR) 25 MG TABLET    Take 25 mg by mouth 2 (two) times daily. Take 25 mg by mouth twice a day for HTN   MULTIPLE VITAMINS-MINERALS (THEREMS M PO)    Take 1 tablet by mouth daily.   OXYBUTYNIN (DITROPAN) 5 MG TABLET    Take 5 mg by mouth 3 (three) times daily.   PANTOPRAZOLE (PROTONIX) 40 MG TABLET    Take 40 mg by mouth 2 (two) times daily.    POLYETHYLENE GLYCOL (MIRALAX / GLYCOLAX) PACKET    Take 17 g by mouth 2 (two) times daily.   PROPYLENE GLYCOL (SYSTANE BALANCE OP)    Apply 1 drop to eye 2 (two) times daily. Both eyes   PROTEIN (PROCEL PO)    Take 1 scoop by mouth 2 (two) times daily.    SENNOSIDES-DOCUSATE SODIUM (SENOKOT-S) 8.6-50 MG TABLET    Take 2 tablets by mouth 2 (two) times daily.  Modified Medications   No medications on file  Discontinued Medications   ACIDOPHILUS (RISAQUAD) CAPS CAPSULE    Take 1 capsule by mouth daily.     Physical Exam: Vitals:   07/01/16 1204  BP: 134/71  Pulse: 81  Resp: 20  Temp: 97.9 F (36.6 C)  TempSrc: Oral  SpO2: 95%  Weight: 244 lb 12.8 oz (111 kg)  Height: 5\' 6"  (1.676 m)  Body mass index is 39.51 kg/m.  Wt Readings from Last 3 Encounters:  07/01/16 244 lb 12.8 oz (111 kg)  06/02/16 247 lb 3.2 oz (112.1 kg)  04/29/16 241 lb 12.8 oz (109.7 kg)   General- elderly female, obese, in no acute distress Head- normocephalic, atraumatic Throat- moist mucus membrane Eyes- PERRLA, EOMI, no pallor, no icterus Neck- no cervical lymphadenopathy Cardiovascular- normal s1,s2, no murmur Respiratory- bilateral clear to auscultation, no wheeze, no rhonchi, no crackles, no use of accessory muscles Abdomen- bowel  sounds present, soft, non tender Musculoskeletal- able to move all 4 extremities, limited normal range of motion , uses wheelchair and rollator walker Neurological- alert and oriented x 3 Skin- warm and dry Psychiatry- normal mood and affect    Labs reviewed: CBC Latest Ref Rng & Units 03/10/2016 01/20/2016 12/03/2015  WBC 10:3/mL 9.6 10.6 10.2  Hemoglobin 12.0 - 16.0 g/dL 16.1 09.6 04.5  Hematocrit 36 - 46 % 37 44 40  Platelets 150 - 399 K/L 273 299 286   CMP Latest Ref Rng & Units 03/10/2016 01/28/2016 01/20/2016  Glucose 65 - 99 mg/dL - - -  BUN 4 - 21 mg/dL 20 15 40(J)  Creatinine 0.5 - 1.1 mg/dL 1.1 1.0 2.0(A)  Sodium 137 - 147 mmol/L 143 147 145  Potassium 3.4 - 5.3 mmol/L 4.0 4.5 3.8  Chloride 98 - 107 mmol/L - - -  CO2 21 - 32 mmol/L - - -  Calcium 8.5 - 10.1 mg/dL - - -  Total Protein 6.4 - 8.2 g/dL - - -  Total Bilirubin 0.2 - 1.0 mg/dL - - -  Alkaline Phos 25 - 125 U/L 109 - -  AST 13 - 35 U/L 11(A) - -  ALT 7 - 35 U/L 14 - -   Lab Results  Component Value Date   HGBA1C 9.0 03/10/2016   Lipid Panel     Component Value Date/Time   CHOL 135 12/03/2015   TRIG 133 12/03/2015   HDL 32 (A) 12/03/2015   LDLCALC 77 12/03/2015     Assessment/Plan  Hyperlipidemia Check lipid panel. Continue atorvastatin 10 mg daily  Dry eyes Stable, continue systane eye drops  Bipolar depression Continue lamotrigine and abilify, followed by psych services  Parkinson's disease Continue her sinemet 25-100 mg bid  Neuropathic pain With charcot's joint disease and DM. Continue gabapentin 200 mg bid  gerd Stable, on protonix 40 mg bid, attempt dose reduction to 40 mg in am and 20 mg in pm  Dm type 2 with neuropathy Lab Results  Component Value Date   HGBA1C 9.0 03/10/2016   On lantus 30 u bid and glimepiride 4 mg daily. Monitor cbg. Continue baby aspirin and statin. on ACEI. Check a1c. Due for eye exam 09/2016. uptodate with foot exam 02/10/16   Oneal Grout, MD Internal  Medicine [facility] [address] [place], [place] [address] Cell Phone (Monday-Friday 8 am - 5 pm): [phone] On Call: [phone] and follow prompts after 5 pm and on weekends Office Phone: [phone] Office Fax: [phone]

## 2016-07-04 DIAGNOSIS — E08319 Diabetes mellitus due to underlying condition with unspecified diabetic retinopathy without macular edema: Secondary | ICD-10-CM | POA: Diagnosis not present

## 2016-07-04 DIAGNOSIS — E785 Hyperlipidemia, unspecified: Secondary | ICD-10-CM | POA: Diagnosis not present

## 2016-07-04 LAB — BASIC METABOLIC PANEL
BUN: 20 mg/dL (ref 4–21)
CREATININE: 0.9 mg/dL (ref 0.5–1.1)
Glucose: 196 mg/dL
POTASSIUM: 4.1 mmol/L (ref 3.4–5.3)
Sodium: 141 mmol/L (ref 137–147)

## 2016-07-04 LAB — HEPATIC FUNCTION PANEL
ALT: 12 U/L (ref 7–35)
AST: 11 U/L — AB (ref 13–35)
Alkaline Phosphatase: 109 U/L (ref 25–125)
Bilirubin, Total: 0.4 mg/dL

## 2016-07-04 LAB — LIPID PANEL
CHOLESTEROL: 142 mg/dL (ref 0–200)
HDL: 35 mg/dL (ref 35–70)
LDL Cholesterol: 72 mg/dL
Triglycerides: 176 mg/dL — AB (ref 40–160)

## 2016-07-04 LAB — HEMOGLOBIN A1C: HEMOGLOBIN A1C: 9.8

## 2016-08-04 DIAGNOSIS — F329 Major depressive disorder, single episode, unspecified: Secondary | ICD-10-CM | POA: Diagnosis not present

## 2016-08-04 DIAGNOSIS — F3130 Bipolar disorder, current episode depressed, mild or moderate severity, unspecified: Secondary | ICD-10-CM | POA: Diagnosis not present

## 2016-08-04 DIAGNOSIS — F0390 Unspecified dementia without behavioral disturbance: Secondary | ICD-10-CM | POA: Diagnosis not present

## 2016-08-05 ENCOUNTER — Non-Acute Institutional Stay (SKILLED_NURSING_FACILITY): Payer: Medicare Other | Admitting: Internal Medicine

## 2016-08-05 ENCOUNTER — Encounter: Payer: Self-pay | Admitting: Internal Medicine

## 2016-08-05 DIAGNOSIS — M2550 Pain in unspecified joint: Secondary | ICD-10-CM | POA: Diagnosis not present

## 2016-08-05 DIAGNOSIS — R0609 Other forms of dyspnea: Secondary | ICD-10-CM

## 2016-08-05 DIAGNOSIS — Z794 Long term (current) use of insulin: Secondary | ICD-10-CM

## 2016-08-05 DIAGNOSIS — E1142 Type 2 diabetes mellitus with diabetic polyneuropathy: Secondary | ICD-10-CM | POA: Diagnosis not present

## 2016-08-05 DIAGNOSIS — R600 Localized edema: Secondary | ICD-10-CM | POA: Diagnosis not present

## 2016-08-05 NOTE — Progress Notes (Signed)
Patient ID: Maureen Taylor, female   DOB: 02/04/1935, 81 y.o.   MRN: [id number]     [facility]   PCP: Oneal Grout, MD  Code Status: Full Code  Allergies  Allergen Reactions  . Codeine Itching  . Morphine And Related Itching  . Indocin [Indomethacin] Rash    Chief Complaint  Patient presents with  . Medical Management of Chronic Issues    Routine Visit    Advanced Directives 05/10/2016  Does patient have an advance directive? No     HPI:  81 year old patient is seen for routine visit in her room. She complaints of shortness of breath with exertion. She continues to be constipated and has bowel movement every 3-4 days with help of stool softner and laxative.  Her medication helps with her heartburn. She has been complaint with her medications. She is out of bed daily. She complaints of multiple joint pain. Her cbg has been elevated. Of note her lantus dos was increased to 35 u bid on 07/25/16.   Review of Systems:  Constitutional: Negative for fever, diaphoresis.  HENT: Negative for headache, congestion, nasal discharge Eyes: Negative for double vision and discharge.  Respiratory: Negative for cough.    Cardiovascular: Negative for chest pain, palpitations  Gastrointestinal: Negative for nausea, vomiting, abdominal pain Musculoskeletal: Negative for back pain, recent falls Skin: Negative for itching, rash.  Neurological: Negative for dizziness Psychiatric/Behavioral: mood has been stable   Past Medical History:  Diagnosis Date  . Allergic rhinitis   . Ascorbic acid deficiency   . Bipolar affective disorder (HCC)   . Candidiasis   . Charcot's joint   . Chronic constipation   . Dysuria   . GERD (gastroesophageal reflux disease)   . History of falling   . HLD (hyperlipidemia)   . HTN (hypertension)   . Magnesium deficiency   . Major depressive disorder   . Mixed incontinence   . Muscle weakness   . Neuropathic pain   .  Neuropathy (HCC)   . OAB (overactive bladder)   . Obesity   . Oral pain   . Parkinson disease (HCC)   . Pneumonia   . Protein calorie malnutrition (HCC)   . Rosacea   . Slow transit constipation   . Type 2 diabetes mellitus with diabetic polyneuropathy, with long-term current use of insulin (HCC)   . Urinary tract infection without hematuria   . Vaginal atrophy   . Vitamin deficiency      Medications: Patient's Medications  New Prescriptions   No medications on file  Previous Medications   ACETAMINOPHEN (TYLENOL) 325 MG TABLET    Take 650 mg by mouth every 6 (six) hours as needed for mild pain.    ARIPIPRAZOLE (ABILIFY) 2 MG TABLET    Take 2 mg by mouth daily.   ASPIRIN 81 MG EC TABLET    Take 81 mg by mouth daily. Swallow whole.   ATORVASTATIN (LIPITOR) 10 MG TABLET    Take 10 mg by mouth daily.   BENAZEPRIL (LOTENSIN) 20 MG TABLET    Take 20 mg by mouth daily.   BISACODYL (DULCOLAX) 10 MG SUPPOSITORY    Place 10 mg rectally daily as needed for moderate constipation.   CARBIDOPA-LEVODOPA (PARCOPA) 25-100 MG DISINTEGRATING TABLET    Take 1 tablet by mouth 2 (two) times daily.    CONJUGATED ESTROGENS (PREMARIN) VAGINAL CREAM    Place 1 Applicatorful vaginally daily. Apply 0.5mg  (pea-sized amount)  just inside  the vaginal introitus with a finger-tip every night for two weeks and then Monday, Wednesday and Friday nights.   CRANBERRY 200 MG CAPS    Take 400 mg by mouth daily. Take two 200-mg capsules = 400 mg   DULOXETINE (CYMBALTA) 60 MG CAPSULE    Take 60 mg by mouth daily.   GABAPENTIN (NEURONTIN) 100 MG CAPSULE    Take 200 mg by mouth 2 (two) times daily. Take two 100 mg capsules to = 200 mg PO BID   GLIMEPIRIDE (AMARYL) 4 MG TABLET    Take 4 mg by mouth daily with breakfast.   INSULIN ASPART (NOVOLOG) 100 UNIT/ML INJECTION    Inject 6 Units into the skin 2 (two) times daily as needed for high blood sugar. If CBG >400, Give NOVOLOG 8 UNITS SQ BID PRN    INSULIN GLARGINE (LANTUS) 100  UNIT/ML INJECTION    Inject 35 Units into the skin 2 (two) times daily. QAM and QHS   LACTOBACILLUS (ACIDOPHILUS) CAPS CAPSULE    Take 1 capsule by mouth daily. 175 mg capsule   LAMOTRIGINE (LAMICTAL) 100 MG TABLET    Take 100 mg by mouth 2 (two) times daily.    LOPERAMIDE (IMODIUM) 2 MG CAPSULE    Take 2 tablets = 4mg  by mouth initially then 1 tablet =2mg  after each episode of diarrhea. DO NOT EXCEED 16MG  IN 24 HOUR PERIOD IF DIARRHEA PERSISTS MORE.   LORATADINE (CLARITIN) 10 MG TABLET    Take 10 mg by mouth daily.    MAGNESIUM OXIDE (MAG-OX) 400 MG TABLET    Take 400 mg by mouth daily.   METOPROLOL TARTRATE (LOPRESSOR) 25 MG TABLET    Take 25 mg by mouth 2 (two) times daily. Take 25 mg by mouth twice a day for HTN   MULTIPLE VITAMINS-MINERALS (THEREMS M PO)    Take 1 tablet by mouth daily.   OXYBUTYNIN (DITROPAN) 5 MG TABLET    Take 5 mg by mouth 3 (three) times daily.   PANTOPRAZOLE (PROTONIX) 20 MG TABLET    Take 20 mg by mouth at bedtime.   PANTOPRAZOLE (PROTONIX) 40 MG TABLET    Take 40 mg by mouth daily.    POLYETHYLENE GLYCOL (MIRALAX / GLYCOLAX) PACKET    Take 17 g by mouth 2 (two) times daily.   PROPYLENE GLYCOL (SYSTANE BALANCE OP)    Apply 1 drop to eye 2 (two) times daily. Both eyes   PROTEIN (PROCEL PO)    Take 1 scoop by mouth 2 (two) times daily.    SENNOSIDES-DOCUSATE SODIUM (SENOKOT-S) 8.6-50 MG TABLET    Take 2 tablets by mouth 2 (two) times daily.  Modified Medications   No medications on file  Discontinued Medications   No medications on file     Physical Exam: Vitals:   08/05/16 1226  BP: 119/67  Pulse: 68  Resp: 17  Temp: 97.2 F (36.2 C)  TempSrc: Oral  SpO2: 98%  Weight: 244 lb 12.8 oz (111 kg)  Height: 5\' 6"  (1.676 m)  Body mass index is 39.51 kg/m.  Wt Readings from Last 3 Encounters:  08/05/16 244 lb 12.8 oz (111 kg)  07/01/16 244 lb 12.8 oz (111 kg)  06/02/16 247 lb 3.2 oz (112.1 kg)   General- elderly female, obese, in no acute distress Head-  normocephalic, atraumatic Throat- moist mucus membrane Eyes- PERRLA, EOMI, no pallor, no icterus Neck- no cervical lymphadenopathy Cardiovascular- normal s1,s2, no murmur, 1+ leg edema Respiratory- poor air entry, +  wheeze, no rhonchi, basilar crackles, no use of accessory muscles Abdomen- bowel sounds present, soft, non tender Musculoskeletal- able to move all 4 extremities, limited normal range of motion, uses wheelchair Neurological- alert and oriented x 3 Skin- warm and dry Psychiatry- normal mood and affect    Labs reviewed: CBC Latest Ref Rng & Units 03/10/2016 01/20/2016 12/03/2015  WBC 10:3/mL 9.6 10.6 10.2  Hemoglobin 12.0 - 16.0 g/dL 16.112.0 09.614.1 04.513.0  Hematocrit 36 - 46 % 37 44 40  Platelets 150 - 399 K/L 273 299 286   CMP Latest Ref Rng & Units 07/04/2016 03/10/2016 01/28/2016  Glucose 65 - 99 mg/dL - - -  BUN 4 - 21 mg/dL 20 20 15   Creatinine 0.5 - 1.1 mg/dL 0.9 1.1 1.0  Sodium 409137 - 147 mmol/L 141 143 147  Potassium 3.4 - 5.3 mmol/L 4.1 4.0 4.5  Chloride 98 - 107 mmol/L - - -  CO2 21 - 32 mmol/L - - -  Calcium 8.5 - 10.1 mg/dL - - -  Total Protein 6.4 - 8.2 g/dL - - -  Total Bilirubin 0.2 - 1.0 mg/dL - - -  Alkaline Phos 25 - 125 U/L 109 109 -  AST 13 - 35 U/L 11(A) 11(A) -  ALT 7 - 35 U/L 12 14 -   Lab Results  Component Value Date   HGBA1C 9.8 07/04/2016   Lipid Panel     Component Value Date/Time   CHOL 142 07/04/2016   TRIG 176 (A) 07/04/2016   HDL 35 07/04/2016   LDLCALC 72 07/04/2016     Assessment/Plan  Dyspnea Has dyspnea with exertion and has wheezing and crackles on lung exam. also has leg edema on exam. Has history of smoking in past. Will start her on albuterol nebulizer and get chest xray to rule out emphysematous changes vs pulmonary edema/ vascular congestion.   Leg edema New finding. With crackles and wheezing on exam and complaints of dyspnea on exertion, concern of fluid overload. Start lasix 20 mg daily with kcl 20 mg daily x 3 days and  monitor. Check bmp 08/08/16.  Dm type 2 with peripheral neuropathy Lab Results  Component Value Date   HGBA1C 9.8 07/04/2016   Uncontrolled diabetes with cbg elevated. Increase lantus to 38 u bid for now and continue SSI novolog. Monitor cbg. Dietary compliance reviewed. Continue glimepiride. Continue gabapentin for neuropathic pain  Multiple joint ache With limited ROM. No joint swelling or redness noted. Get PMR consult. Continue tylenol 650 mg q6h prn pain and duloxetine    Oneal GroutMAHIMA Scottlyn Mchaney, MD Internal Medicine [facility] [address] [place], [place] [address] Cell Phone (Monday-Friday 8 am - 5 pm): [phone] On Call: [phone] and follow prompts after 5 pm and on weekends Office Phone: [phone] Office Fax: [phone]

## 2016-08-06 DIAGNOSIS — R062 Wheezing: Secondary | ICD-10-CM | POA: Diagnosis not present

## 2016-08-08 DIAGNOSIS — I10 Essential (primary) hypertension: Secondary | ICD-10-CM | POA: Diagnosis not present

## 2016-08-19 ENCOUNTER — Emergency Department (HOSPITAL_COMMUNITY): Payer: Medicare Other

## 2016-08-19 ENCOUNTER — Encounter (HOSPITAL_COMMUNITY): Payer: Self-pay | Admitting: Emergency Medicine

## 2016-08-19 ENCOUNTER — Emergency Department (HOSPITAL_COMMUNITY)
Admission: EM | Admit: 2016-08-19 | Discharge: 2016-08-19 | Disposition: A | Discharge status: Home / Self Care | Payer: Medicare Other | Attending: Emergency Medicine | Admitting: Emergency Medicine

## 2016-08-19 DIAGNOSIS — W19XXXA Unspecified fall, initial encounter: Secondary | ICD-10-CM

## 2016-08-19 DIAGNOSIS — Y999 Unspecified external cause status: Secondary | ICD-10-CM | POA: Insufficient documentation

## 2016-08-19 DIAGNOSIS — Z96651 Presence of right artificial knee joint: Secondary | ICD-10-CM | POA: Insufficient documentation

## 2016-08-19 DIAGNOSIS — W228XXA Striking against or struck by other objects, initial encounter: Secondary | ICD-10-CM | POA: Diagnosis not present

## 2016-08-19 DIAGNOSIS — S199XXA Unspecified injury of neck, initial encounter: Secondary | ICD-10-CM | POA: Diagnosis not present

## 2016-08-19 DIAGNOSIS — S0181XA Laceration without foreign body of other part of head, initial encounter: Secondary | ICD-10-CM | POA: Insufficient documentation

## 2016-08-19 DIAGNOSIS — S098XXA Other specified injuries of head, initial encounter: Secondary | ICD-10-CM | POA: Diagnosis not present

## 2016-08-19 DIAGNOSIS — G20 Parkinson's disease: Secondary | ICD-10-CM | POA: Diagnosis not present

## 2016-08-19 DIAGNOSIS — Z7982 Long term (current) use of aspirin: Secondary | ICD-10-CM | POA: Diagnosis not present

## 2016-08-19 DIAGNOSIS — S0990XA Unspecified injury of head, initial encounter: Secondary | ICD-10-CM | POA: Diagnosis not present

## 2016-08-19 DIAGNOSIS — Y939 Activity, unspecified: Secondary | ICD-10-CM | POA: Insufficient documentation

## 2016-08-19 DIAGNOSIS — Z794 Long term (current) use of insulin: Secondary | ICD-10-CM | POA: Diagnosis not present

## 2016-08-19 DIAGNOSIS — S0180XA Unspecified open wound of other part of head, initial encounter: Secondary | ICD-10-CM | POA: Diagnosis not present

## 2016-08-19 DIAGNOSIS — N390 Urinary tract infection, site not specified: Secondary | ICD-10-CM | POA: Diagnosis not present

## 2016-08-19 DIAGNOSIS — Y929 Unspecified place or not applicable: Secondary | ICD-10-CM | POA: Insufficient documentation

## 2016-08-19 DIAGNOSIS — Z23 Encounter for immunization: Secondary | ICD-10-CM | POA: Insufficient documentation

## 2016-08-19 DIAGNOSIS — E1140 Type 2 diabetes mellitus with diabetic neuropathy, unspecified: Secondary | ICD-10-CM | POA: Insufficient documentation

## 2016-08-19 MED ORDER — LIDOCAINE-EPINEPHRINE 1 %-1:100000 IJ SOLN
10.0000 mL | Freq: Once | INTRAMUSCULAR | Status: AC
  Administered 2016-08-19: 10 mL via INTRADERMAL
  Filled 2016-08-19: qty 1

## 2016-08-19 MED ORDER — TETANUS-DIPHTH-ACELL PERTUSSIS 5-2.5-18.5 LF-MCG/0.5 IM SUSP
0.5000 mL | Freq: Once | INTRAMUSCULAR | Status: AC
  Administered 2016-08-19: 0.5 mL via INTRAMUSCULAR
  Filled 2016-08-19: qty 0.5

## 2016-08-19 NOTE — ED Provider Notes (Signed)
Laceration repair performed. 2.5; laceration repaired with 6-0 Prolene sutures. 9 sutures were placed. Verbal consent was obtained by the patient. Risks procedures were were given including poor wound healing, need for additional sutures, pain, infection, nerve or vessel damage. Wound was cleaned and irrigated by nursing staff and no signs of foreign body were noted. Hemostasis was achieved by direct pressure.. Wound was well approximated. Patient tolerated procedure well.    Rise MuKenneth T Leaphart, PA-C 08/19/16 1939    Nira ConnPedro Eduardo Cardama, MD 08/20/16 [phone]

## 2016-08-19 NOTE — ED Provider Notes (Signed)
[facility]-EMERGENCY DEPT Provider Note   CSN: [id number] Arrival date & time: 08/19/16  1636     History   Chief Complaint Chief Complaint  Patient presents with  . Fall    HPI Maureen Taylor is a 81 y.o. female.  The history is provided by the patient and a friend.  Fall  This is a new problem. The current episode started 1 to 2 hours ago. Episode frequency: once. The problem has been resolved. Associated symptoms include headaches. Pertinent negatives include no chest pain, no abdominal pain and no shortness of breath. Nothing aggravates the symptoms. Nothing relieves the symptoms. She has tried nothing for the symptoms. The treatment provided no relief.   [facility] witnessed slip and fall, struck head on wheelchair arm. Has laceration to left forehead. No LOC. No anticoagulants. No other injuries or complaints.  Past Medical History:  Diagnosis Date  . Allergic rhinitis   . Ascorbic acid deficiency   . Bipolar affective disorder (HCC)   . Candidiasis   . Charcot's joint   . Chronic constipation   . Dysuria   . GERD (gastroesophageal reflux disease)   . History of falling   . HLD (hyperlipidemia)   . HTN (hypertension)   . Magnesium deficiency   . Major depressive disorder   . Mixed incontinence   . Muscle weakness   . Neuropathic pain   . Neuropathy (HCC)   . OAB (overactive bladder)   . Obesity   . Oral pain   . Parkinson disease (HCC)   . Pneumonia   . Protein calorie malnutrition (HCC)   . Rosacea   . Slow transit constipation   . Type 2 diabetes mellitus with diabetic polyneuropathy, with long-term current use of insulin (HCC)   . Urinary tract infection without hematuria   . Vaginal atrophy   . Vitamin deficiency     Patient Active Problem List   Diagnosis Date Noted  . Hyperlipidemia 06/05/2015  . Type 2 diabetes mellitus with diabetic polyneuropathy (HCC) 06/05/2015  . Vitamin D deficiency 06/05/2015  . Recurrent UTI  04/17/2015  . Vaginal atrophy 04/17/2015  . Incontinence 04/17/2015  . OAB (overactive bladder) 03/03/2015  . Bipolar depression (HCC) 01/05/2015  . GERD (gastroesophageal reflux disease) 01/05/2015  . Allergic rhinitis 01/05/2015  . Essential hypertension 01/05/2015  . Oral thrush 01/05/2015  . Parkinson's disease (HCC) 01/05/2015  . Neuropathy (HCC) 01/05/2015  . Diabetes mellitus with neuropathy (HCC) 01/05/2015    Past Surgical History:  Procedure Laterality Date  . ABDOMINAL HYSTERECTOMY    . CERVICAL CONIZATION W/BX    . CHOLECYSTECTOMY    . REVISION TOTAL KNEE ARTHROPLASTY Right     OB History    No data available       Home Medications    Prior to Admission medications   Medication Sig Start Date End Date Taking? Authorizing Provider  acetaminophen (TYLENOL) 325 MG tablet Take 650 mg by mouth every 6 (six) hours as needed for mild pain.    Yes Historical Provider, MD  ARIPiprazole (ABILIFY) 2 MG tablet Take 2 mg by mouth daily.   Yes Historical Provider, MD  aspirin 81 MG EC tablet Take 81 mg by mouth daily. Swallow whole.   Yes Historical Provider, MD  atorvastatin (LIPITOR) 10 MG tablet Take 10 mg by mouth daily.   Yes Historical Provider, MD  benazepril (LOTENSIN) 20 MG tablet Take 20 mg by mouth daily.   Yes Historical Provider, MD  bisacodyl (DULCOLAX) 10 MG suppository Place 10 mg rectally daily as needed for moderate constipation.   Yes Historical Provider, MD  carbidopa-levodopa (PARCOPA) 25-100 MG disintegrating tablet Take 1 tablet by mouth 2 (two) times daily.    Yes Historical Provider, MD  conjugated estrogens (PREMARIN) vaginal cream Place 1 Applicatorful vaginally daily. Apply 0.5mg  (pea-sized amount)  just inside the vaginal introitus with a finger-tip every night for two weeks and then Monday, Wednesday and Friday nights. 02/25/16  Yes Shannon A McGowan, PA-C  Cranberry 200 MG CAPS Take 400 mg by mouth daily. Take two 200-mg capsules = 400 mg   Yes  Historical Provider, MD  DULoxetine (CYMBALTA) 60 MG capsule Take 60 mg by mouth daily.   Yes Historical Provider, MD  gabapentin (NEURONTIN) 100 MG capsule Take 200 mg by mouth 2 (two) times daily. Take two 100 mg capsules to = 200 mg PO BID   Yes Historical Provider, MD  glimepiride (AMARYL) 4 MG tablet Take 4 mg by mouth daily with breakfast.   Yes Historical Provider, MD  insulin aspart (NOVOLOG) 100 UNIT/ML injection Inject 6 Units into the skin 2 (two) times daily as needed for high blood sugar. If CBG >400, Give NOVOLOG 8 UNITS SQ BID PRN    Yes Historical Provider, MD  insulin glargine (LANTUS) 100 UNIT/ML injection Inject 35 Units into the skin 2 (two) times daily. QAM and QHS   Yes Historical Provider, MD  lamoTRIgine (LAMICTAL) 100 MG tablet Take 100 mg by mouth 2 (two) times daily.    Yes Historical Provider, MD  loperamide (IMODIUM) 2 MG capsule Take 2 tablets = 4mg  by mouth initially then 1 tablet =2mg  after each episode of diarrhea. DO NOT EXCEED 16MG  IN 24 HOUR PERIOD IF DIARRHEA PERSISTS MORE.   Yes Historical Provider, MD  loratadine (CLARITIN) 10 MG tablet Take 10 mg by mouth daily.    Yes Historical Provider, MD  magnesium oxide (MAG-OX) 400 MG tablet Take 400 mg by mouth daily.   Yes Historical Provider, MD  metoprolol tartrate (LOPRESSOR) 25 MG tablet Take 25 mg by mouth 2 (two) times daily. Take 25 mg by mouth twice a day for HTN   Yes Historical Provider, MD  miconazole (MONISTAT 7) 2 % vaginal cream Place 1 Applicatorful vaginally 2 (two) times daily.   Yes Historical Provider, MD  oxybutynin (DITROPAN) 5 MG tablet Take 5 mg by mouth 3 (three) times daily.   Yes Historical Provider, MD  pantoprazole (PROTONIX) 20 MG tablet Take 20 mg by mouth at bedtime.   Yes Historical Provider, MD  pantoprazole (PROTONIX) 40 MG tablet Take 40 mg by mouth daily.    Yes Historical Provider, MD  polyethylene glycol (MIRALAX / GLYCOLAX) packet Take 17 g by mouth 2 (two) times daily.   Yes  Historical Provider, MD  Propylene Glycol (SYSTANE BALANCE OP) Apply 1 drop to eye 2 (two) times daily. Both eyes   Yes Historical Provider, MD  Protein (PROCEL PO) Take 1 scoop by mouth 2 (two) times daily.    Yes Historical Provider, MD  sennosides-docusate sodium (SENOKOT-S) 8.6-50 MG tablet Take 2 tablets by mouth 2 (two) times daily.   Yes Historical Provider, MD    Family History Family History  Problem Relation Age of Onset  . Kidney disease Neg Hx   . Bladder Cancer Neg Hx   . Prostate cancer Neg Hx     Social History Social History  Substance Use Topics  . Smoking status: Never  Smoker  . Smokeless tobacco: Never Used  . Alcohol use No     Allergies   Codeine; Morphine and related; and Indocin [indomethacin]   Review of Systems Review of Systems  Respiratory: Negative for shortness of breath.   Cardiovascular: Positive for leg swelling (chronic). Negative for chest pain.  Gastrointestinal: Negative for abdominal pain.  Neurological: Positive for headaches.  Ten systems are reviewed and are negative for acute change except as noted in the HPI    Physical Exam Updated Vital Signs BP 122/69 (BP Location: Left Arm)   Pulse 95   Temp 97.7 F (36.5 C) (Oral)   Resp 18   SpO2 93%   Physical Exam  Constitutional: She is oriented to person, place, and time. She appears well-developed and well-nourished. No distress.  HENT:  Head: Normocephalic.    Right Ear: External ear normal.  Left Ear: External ear normal.  Nose: Nose normal.  Eyes: Conjunctivae and EOM are normal. Pupils are equal, round, and reactive to light. Right eye exhibits no discharge. Left eye exhibits no discharge. No scleral icterus.  Neck: Normal range of motion. Neck supple.  Cardiovascular: Normal rate, regular rhythm and normal heart sounds.  Exam reveals no gallop and no friction rub.   No murmur heard. Pulses:      Radial pulses are 2+ on the right side, and 2+ on the left side.        Dorsalis pedis pulses are 2+ on the right side, and 2+ on the left side.  Pulmonary/Chest: Effort normal and breath sounds normal. No stridor. No respiratory distress. She has no wheezes.  Abdominal: Soft. She exhibits no distension. There is no tenderness.  Musculoskeletal: She exhibits no edema or tenderness.       Cervical back: She exhibits no bony tenderness.       Thoracic back: She exhibits no bony tenderness.       Lumbar back: She exhibits no bony tenderness.  Clavicles stable. Chest stable to AP/Lat compression. Pelvis stable to Lat compression. No obvious extremity deformity. No chest or abdominal wall contusion.  Neurological: She is alert and oriented to person, place, and time.  Moving all extremities  Skin: Skin is warm and dry. No rash noted. She is not diaphoretic. No erythema.  Psychiatric: She has a normal mood and affect.     ED Treatments / Results  Labs (all labs ordered are listed, but only abnormal results are displayed) Labs Reviewed - No data to display  EKG  EKG Interpretation None       Radiology Ct Head Wo Contrast  Result Date: 08/19/2016 CLINICAL DATA:  Fall EXAM: CT HEAD WITHOUT CONTRAST CT CERVICAL SPINE WITHOUT CONTRAST TECHNIQUE: Multidetector CT imaging of the head and cervical spine was performed following the standard protocol without intravenous contrast. Multiplanar CT image reconstructions of the cervical spine were also generated. COMPARISON:  CT head and cervical spine 05/10/2016 FINDINGS: CT HEAD FINDINGS Brain: No mass lesion, intraparenchymal hemorrhage or extra-axial collection. No evidence of acute cortical infarct. There is periventricular hypoattenuation compatible with chronic microvascular disease. Vascular: No hyperdense vessel or unexpected calcification. Skull: Normal visualized skull base, calvarium and extracranial soft tissues. Sinuses/Orbits: No sinus fluid levels or advanced mucosal thickening. No mastoid effusion. Normal  orbits. CT CERVICAL SPINE FINDINGS The lower cervical spine is severely degraded by motion the attenuation. Alignment: There is grade 1 anterolisthesis at C4-C5, unchanged. There is no acute static subluxation. The lateral masses of C1 and C2 in the  occipital condyles are aligned. Skull base and vertebrae: No acute fracture. Soft tissues and spinal canal: No prevertebral fluid or swelling. No visible canal hematoma. Disc levels: There is fusion at the C5 -T1 levels. No osseous spinal canal stenosis. Multilevel facet hypertrophy. Upper chest: No pneumothorax, pulmonary nodule or pleural effusion. Other: Normal visualized paraspinal cervical soft tissues. IMPRESSION: 1. No acute intracranial abnormality. 2. No acute fracture or static subluxation of the cervical spine. 3. Findings of chronic microvascular ischemia. 4. Multilevel degenerative disc disease and facet arthrosis. Electronically Signed   By: Deatra Robinson M.D.   On: 08/19/2016 17:48   Ct Cervical Spine Wo Contrast  Result Date: 08/19/2016 CLINICAL DATA:  Fall EXAM: CT HEAD WITHOUT CONTRAST CT CERVICAL SPINE WITHOUT CONTRAST TECHNIQUE: Multidetector CT imaging of the head and cervical spine was performed following the standard protocol without intravenous contrast. Multiplanar CT image reconstructions of the cervical spine were also generated. COMPARISON:  CT head and cervical spine 05/10/2016 FINDINGS: CT HEAD FINDINGS Brain: No mass lesion, intraparenchymal hemorrhage or extra-axial collection. No evidence of acute cortical infarct. There is periventricular hypoattenuation compatible with chronic microvascular disease. Vascular: No hyperdense vessel or unexpected calcification. Skull: Normal visualized skull base, calvarium and extracranial soft tissues. Sinuses/Orbits: No sinus fluid levels or advanced mucosal thickening. No mastoid effusion. Normal orbits. CT CERVICAL SPINE FINDINGS The lower cervical spine is severely degraded by motion the  attenuation. Alignment: There is grade 1 anterolisthesis at C4-C5, unchanged. There is no acute static subluxation. The lateral masses of C1 and C2 in the occipital condyles are aligned. Skull base and vertebrae: No acute fracture. Soft tissues and spinal canal: No prevertebral fluid or swelling. No visible canal hematoma. Disc levels: There is fusion at the C5 -T1 levels. No osseous spinal canal stenosis. Multilevel facet hypertrophy. Upper chest: No pneumothorax, pulmonary nodule or pleural effusion. Other: Normal visualized paraspinal cervical soft tissues. IMPRESSION: 1. No acute intracranial abnormality. 2. No acute fracture or static subluxation of the cervical spine. 3. Findings of chronic microvascular ischemia. 4. Multilevel degenerative disc disease and facet arthrosis. Electronically Signed   By: Deatra Robinson M.D.   On: 08/19/2016 17:48    Procedures Procedures (including critical care time)  Medications Ordered in ED Medications  Tdap (BOOSTRIX) injection 0.5 mL (0.5 mLs Intramuscular Given 08/19/16 1827)  lidocaine-EPINEPHrine (XYLOCAINE W/EPI) 1 %-1:100000 (with pres) injection 10 mL (10 mLs Intradermal Given 08/19/16 1827)     Initial Impression / Assessment and Plan / ED Course  I have reviewed the triage vital signs and the nursing notes.  Pertinent labs & imaging results that were available during my care of the patient were reviewed by me and considered in my medical decision making (see chart for details).  Clinical Course     Mechanical fall. CT head and cervical spine without acute injuries. Laceration of the eyebrow thoroughly irrigated and closed by mid-level provider. Please see their procedure note. Tetanus booster updated.   Safe for discharge with strict return precautions.  Final Clinical Impressions(s) / ED Diagnoses   Final diagnoses:  Fall, initial encounter  Laceration of forehead, initial encounter   Disposition: Discharge  Condition: Good  I have  discussed the results, Dx and Tx plan with the patient who expressed understanding and agree(s) with the plan. Discharge instructions discussed at great length. The patient was given strict return precautions who verbalized understanding of the instructions. No further questions at time of discharge.    Current Discharge Medication List  Follow Up: Oneal Grout, MD [address] [place] [address] [phone]  Schedule an appointment as soon as possible for a visit  As needed  [facility]-EMERGENCY DEPT [address] [id number] mc [place] [address] [phone] Go in 1 week For suture removal      Nira Conn, MD 08/19/16 2031

## 2016-08-19 NOTE — ED Triage Notes (Signed)
[facility] EMS [facility] for witnessed slip and fall, struck head on wheelchair arm. No LOC. No anticoagulants.

## 2016-08-19 NOTE — ED Notes (Signed)
Bed: WA06 Expected date: 08/19/16 Expected time: 4:45 PM Means of arrival: Ambulance Comments: Fall, above left eye lac

## 2016-08-21 ENCOUNTER — Encounter (HOSPITAL_COMMUNITY): Payer: Self-pay | Admitting: Emergency Medicine

## 2016-08-21 ENCOUNTER — Emergency Department (HOSPITAL_COMMUNITY)
Admission: EM | Admit: 2016-08-21 | Discharge: 2016-08-22 | Disposition: A | Discharge status: Home / Self Care | Payer: Medicare Other | Source: Home / Self Care | Attending: Emergency Medicine | Admitting: Emergency Medicine

## 2016-08-21 DIAGNOSIS — E1140 Type 2 diabetes mellitus with diabetic neuropathy, unspecified: Secondary | ICD-10-CM | POA: Insufficient documentation

## 2016-08-21 DIAGNOSIS — Z7982 Long term (current) use of aspirin: Secondary | ICD-10-CM

## 2016-08-21 DIAGNOSIS — S0512XA Contusion of eyeball and orbital tissues, left eye, initial encounter: Secondary | ICD-10-CM

## 2016-08-21 DIAGNOSIS — Y999 Unspecified external cause status: Secondary | ICD-10-CM

## 2016-08-21 DIAGNOSIS — Z79899 Other long term (current) drug therapy: Secondary | ICD-10-CM | POA: Insufficient documentation

## 2016-08-21 DIAGNOSIS — W228XXA Striking against or struck by other objects, initial encounter: Secondary | ICD-10-CM | POA: Insufficient documentation

## 2016-08-21 DIAGNOSIS — S79912A Unspecified injury of left hip, initial encounter: Secondary | ICD-10-CM | POA: Diagnosis not present

## 2016-08-21 DIAGNOSIS — Y929 Unspecified place or not applicable: Secondary | ICD-10-CM

## 2016-08-21 DIAGNOSIS — Y939 Activity, unspecified: Secondary | ICD-10-CM

## 2016-08-21 DIAGNOSIS — Z794 Long term (current) use of insulin: Secondary | ICD-10-CM | POA: Insufficient documentation

## 2016-08-21 DIAGNOSIS — G20 Parkinson's disease: Secondary | ICD-10-CM | POA: Insufficient documentation

## 2016-08-21 DIAGNOSIS — I10 Essential (primary) hypertension: Secondary | ICD-10-CM | POA: Insufficient documentation

## 2016-08-21 DIAGNOSIS — S0181XA Laceration without foreign body of other part of head, initial encounter: Secondary | ICD-10-CM

## 2016-08-21 DIAGNOSIS — S098XXA Other specified injuries of head, initial encounter: Secondary | ICD-10-CM | POA: Diagnosis not present

## 2016-08-21 DIAGNOSIS — W19XXXA Unspecified fall, initial encounter: Secondary | ICD-10-CM

## 2016-08-21 DIAGNOSIS — H578 Other specified disorders of eye and adnexa: Secondary | ICD-10-CM | POA: Diagnosis not present

## 2016-08-21 DIAGNOSIS — S0083XA Contusion of other part of head, initial encounter: Secondary | ICD-10-CM | POA: Diagnosis not present

## 2016-08-21 DIAGNOSIS — M25552 Pain in left hip: Secondary | ICD-10-CM | POA: Diagnosis not present

## 2016-08-21 NOTE — ED Notes (Signed)
ED Provider at bedside. 

## 2016-08-21 NOTE — ED Triage Notes (Signed)
Brought in by EMS from [facility] with c/o right eye pain after her unwitnessed fall tonight.  Pt was found lying on floor beside bed.  Per facility staff, this was her second fall tonight.  Pt has had previous and recent falls where she sustained laceration to forehead and with c/o lower back pain.  Pt has dementia and parkinson's.

## 2016-08-21 NOTE — ED Provider Notes (Signed)
[facility]-EMERGENCY DEPT Provider Note   CSN: [id number] Arrival date & time: 08/21/16  2342  By signing my name below, I, Clarisse GougeXavier Herndon, attest that this documentation has been prepared under the direction and in the presence of Gilda Creasehristopher J Pollina, MD. Electronically signed, Clarisse GougeXavier Herndon, ED Scribe. 08/22/16. 12:59 AM.    History   Chief Complaint Chief Complaint  Patient presents with  . Fall  . Eye Pain   The history is provided by the patient and medical records. No language interpreter was used.   HPI Comments: Maureen Taylor is a 81 y.o. female with PMHx of dementia and Parkinson's disease who presents to the Emergency Department s/p an unwitnessed fall this evening. She states that she fell forward and hit the right side of the head just above her eye. Per triage, this is her second fall tonight. She reports associated head pain. Pt further notes a head injury from a fall 2 days ago. She notes unrelated left hip pain x 3 weeks. Pt denies neck pain and back pain currently, unsure if LOC.   Past Medical History:  Diagnosis Date  . Allergic rhinitis   . Ascorbic acid deficiency   . Bipolar affective disorder (HCC)   . Candidiasis   . Charcot's joint   . Chronic constipation   . Dysuria   . GERD (gastroesophageal reflux disease)   . History of falling   . HLD (hyperlipidemia)   . HTN (hypertension)   . Magnesium deficiency   . Major depressive disorder   . Mixed incontinence   . Muscle weakness   . Neuropathic pain   . Neuropathy (HCC)   . OAB (overactive bladder)   . Obesity   . Oral pain   . Parkinson disease (HCC)   . Pneumonia   . Protein calorie malnutrition (HCC)   . Rosacea   . Slow transit constipation   . Type 2 diabetes mellitus with diabetic polyneuropathy, with long-term current use of insulin (HCC)   . Urinary tract infection without hematuria   . Vaginal atrophy   . Vitamin deficiency     Patient Active Problem List   Diagnosis Date Noted    . Hyperlipidemia 06/05/2015  . Type 2 diabetes mellitus with diabetic polyneuropathy (HCC) 06/05/2015  . Vitamin D deficiency 06/05/2015  . Recurrent UTI 04/17/2015  . Vaginal atrophy 04/17/2015  . Incontinence 04/17/2015  . OAB (overactive bladder) 03/03/2015  . Bipolar depression (HCC) 01/05/2015  . GERD (gastroesophageal reflux disease) 01/05/2015  . Allergic rhinitis 01/05/2015  . Essential hypertension 01/05/2015  . Oral thrush 01/05/2015  . Parkinson's disease (HCC) 01/05/2015  . Neuropathy (HCC) 01/05/2015  . Diabetes mellitus with neuropathy (HCC) 01/05/2015    Past Surgical History:  Procedure Laterality Date  . ABDOMINAL HYSTERECTOMY    . CERVICAL CONIZATION W/BX    . CHOLECYSTECTOMY    . REVISION TOTAL KNEE ARTHROPLASTY Right     OB History    No data available       Home Medications    Prior to Admission medications   Medication Sig Start Date End Date Taking? Authorizing Provider  acetaminophen (TYLENOL) 325 MG tablet Take 650 mg by mouth every 6 (six) hours as needed for mild pain.     Historical Provider, MD  ARIPiprazole (ABILIFY) 2 MG tablet Take 2 mg by mouth daily.    Historical Provider, MD  aspirin 81 MG EC tablet Take 81 mg by mouth daily. Swallow whole.    Historical Provider, MD  atorvastatin (LIPITOR) 10 MG tablet Take 10 mg by mouth daily.    Historical Provider, MD  benazepril (LOTENSIN) 20 MG tablet Take 20 mg by mouth daily.    Historical Provider, MD  bisacodyl (DULCOLAX) 10 MG suppository Place 10 mg rectally daily as needed for moderate constipation.    Historical Provider, MD  carbidopa-levodopa (PARCOPA) 25-100 MG disintegrating tablet Take 1 tablet by mouth 2 (two) times daily.     Historical Provider, MD  conjugated estrogens (PREMARIN) vaginal cream Place 1 Applicatorful vaginally daily. Apply 0.5mg  (pea-sized amount)  just inside the vaginal introitus with a finger-tip every night for two weeks and then Monday, Wednesday and Friday  nights. 02/25/16   Carollee Herter A McGowan, PA-C  Cranberry 200 MG CAPS Take 400 mg by mouth daily. Take two 200-mg capsules = 400 mg    Historical Provider, MD  DULoxetine (CYMBALTA) 60 MG capsule Take 60 mg by mouth daily.    Historical Provider, MD  gabapentin (NEURONTIN) 100 MG capsule Take 200 mg by mouth 2 (two) times daily. Take two 100 mg capsules to = 200 mg PO BID    Historical Provider, MD  glimepiride (AMARYL) 4 MG tablet Take 4 mg by mouth daily with breakfast.    Historical Provider, MD  insulin aspart (NOVOLOG) 100 UNIT/ML injection Inject 6 Units into the skin 2 (two) times daily as needed for high blood sugar. If CBG >400, Give NOVOLOG 8 UNITS SQ BID PRN     Historical Provider, MD  insulin glargine (LANTUS) 100 UNIT/ML injection Inject 35 Units into the skin 2 (two) times daily. QAM and QHS    Historical Provider, MD  lamoTRIgine (LAMICTAL) 100 MG tablet Take 100 mg by mouth 2 (two) times daily.     Historical Provider, MD  loperamide (IMODIUM) 2 MG capsule Take 2 tablets = 4mg  by mouth initially then 1 tablet =2mg  after each episode of diarrhea. DO NOT EXCEED 16MG  IN 24 HOUR PERIOD IF DIARRHEA PERSISTS MORE.    Historical Provider, MD  loratadine (CLARITIN) 10 MG tablet Take 10 mg by mouth daily.     Historical Provider, MD  magnesium oxide (MAG-OX) 400 MG tablet Take 400 mg by mouth daily.    Historical Provider, MD  metoprolol tartrate (LOPRESSOR) 25 MG tablet Take 25 mg by mouth 2 (two) times daily. Take 25 mg by mouth twice a day for HTN    Historical Provider, MD  miconazole (MONISTAT 7) 2 % vaginal cream Place 1 Applicatorful vaginally 2 (two) times daily.    Historical Provider, MD  oxybutynin (DITROPAN) 5 MG tablet Take 5 mg by mouth 3 (three) times daily.    Historical Provider, MD  pantoprazole (PROTONIX) 20 MG tablet Take 20 mg by mouth at bedtime.    Historical Provider, MD  pantoprazole (PROTONIX) 40 MG tablet Take 40 mg by mouth daily.     Historical Provider, MD    polyethylene glycol (MIRALAX / GLYCOLAX) packet Take 17 g by mouth 2 (two) times daily.    Historical Provider, MD  Propylene Glycol (SYSTANE BALANCE OP) Apply 1 drop to eye 2 (two) times daily. Both eyes    Historical Provider, MD  Protein (PROCEL PO) Take 1 scoop by mouth 2 (two) times daily.     Historical Provider, MD  sennosides-docusate sodium (SENOKOT-S) 8.6-50 MG tablet Take 2 tablets by mouth 2 (two) times daily.    Historical Provider, MD    Family History Family History  Problem Relation Age of Onset  .  Kidney disease Neg Hx   . Bladder Cancer Neg Hx   . Prostate cancer Neg Hx     Social History Social History  Substance Use Topics  . Smoking status: Never Smoker  . Smokeless tobacco: Never Used  . Alcohol use No     Allergies   Codeine; Morphine and related; and Indocin [indomethacin]   Review of Systems Review of Systems  Musculoskeletal: Positive for arthralgias and myalgias. Negative for back pain and neck pain.  Neurological: Positive for headaches.  All other systems reviewed and are negative.    Physical Exam Updated Vital Signs BP 144/71   Pulse 92   Temp 98 F (36.7 C) (Oral)   Resp 17   Ht 5\' 5"  (1.651 m)   Wt 245 lb (111.1 kg)   SpO2 96%   BMI 40.77 kg/m   Physical Exam  Constitutional: She is oriented to person, place, and time. She appears well-developed and well-nourished. No distress.  HENT:  Head: Normocephalic.  Right Ear: Hearing normal.  Left Ear: Hearing normal.  Nose: Nose normal.  Mouth/Throat: Oropharynx is clear and moist and mucous membranes are normal.  Laceration left forehead that is sutured and intact. No signs of infection. Left infraorbital ecchymosis. Slight tenderness lateral to the right eye without any swelling, abrasion, laceration, or bruising.  Eyes: Conjunctivae and EOM are normal. Pupils are equal, round, and reactive to light.  Neck: Normal range of motion. Neck supple.  Cardiovascular: Regular rhythm, S1  normal and S2 normal.  Exam reveals no gallop and no friction rub.   No murmur heard. Pulmonary/Chest: Effort normal and breath sounds normal. No respiratory distress. She exhibits no tenderness.  Abdominal: Soft. Normal appearance and bowel sounds are normal. There is no hepatosplenomegaly. There is no tenderness. There is no rebound, no guarding, no tenderness at McBurney's point and negative Murphy's sign. No hernia.  Musculoskeletal: Normal range of motion. She exhibits no deformity.  Normal ROM. No deformity left hip.  Neurological: She is alert and oriented to person, place, and time. She has normal strength. No cranial nerve deficit or sensory deficit. Coordination normal. GCS eye subscore is 4. GCS verbal subscore is 5. GCS motor subscore is 6.  Skin: Skin is warm, dry and intact. No rash noted. No cyanosis.  Psychiatric: She has a normal mood and affect. Her speech is normal and behavior is normal. Thought content normal.  Nursing note and vitals reviewed.    ED Treatments / Results  DIAGNOSTIC STUDIES: Oxygen Saturation is 95% on RA, adequate by my interpretation.    COORDINATION OF CARE: 12:59 AM Discussed treatment plan with pt at bedside and pt agreed to plan.  Labs (all labs ordered are listed, but only abnormal results are displayed) Labs Reviewed - No data to display  EKG  EKG Interpretation None       Radiology Dg Hip Unilat W Or Wo Pelvis 2-3 Views Left  Result Date: 08/22/2016 CLINICAL DATA:  Fall at home, pain and unable to ambulate EXAM: DG HIP (WITH OR WITHOUT PELVIS) 2-3V LEFT COMPARISON:  03/25/2015, 03/23/2013, 03/22/2013 FINDINGS: The patient is status post right hip replacement with no dislocation. Heterotopic bone formation about the right hip is unchanged. Pubic symphysis appears intact. Mild to moderate degenerative changes of the left hip. No acute fracture or dislocation. Heterotopic calcification along the proximal shaft of the left femur is  radiographically stable. There are vascular calcifications. IMPRESSION: 1. No acute osseous abnormality. 2. Status post right hip replacement  3. Mild to moderate degenerative changes of the left hip Electronically Signed   By: Jasmine Pang M.D.   On: 08/22/2016 00:43    Procedures Procedures (including critical care time)  Medications Ordered in ED Medications - No data to display   Initial Impression / Assessment and Plan / ED Course  I have reviewed the triage vital signs and the nursing notes.  Pertinent labs & imaging results that were available during my care of the patient were reviewed by me and considered in my medical decision making (see chart for details).  Clinical Course    Patient presents to the emergency department after a fall. Patient reports that she fell after she bent over and lost her balance. Patient pain In her right eye. There is very slight tenderness in this area but no evidence of trauma, bruising, abrasion, laceration or swelling. She has normal extraocular motion. No evidence of fracture or significant injury. Patient awake and alert. No concern for intracranial injury. She is complaining of hip pain which may be preexistent. X-ray performed, negative. Final Clinical Impressions(s) / ED Diagnoses   Final diagnoses:  Fall, initial encounter  Contusion of face, initial encounter    New Prescriptions New Prescriptions   No medications on file  I personally performed the services described in this documentation, which was scribed in my presence. The recorded information has been reviewed and is accurate.    Gilda Crease, MD 08/22/16 [phone]

## 2016-08-21 NOTE — ED Notes (Signed)
Bed: [id number] Expected date:  Expected time:  Means of arrival:  Comments: 81 yo F/ Fall

## 2016-08-22 ENCOUNTER — Emergency Department (HOSPITAL_COMMUNITY): Payer: Medicare Other

## 2016-08-22 DIAGNOSIS — R259 Unspecified abnormal involuntary movements: Secondary | ICD-10-CM | POA: Diagnosis not present

## 2016-08-22 DIAGNOSIS — S0993XA Unspecified injury of face, initial encounter: Secondary | ICD-10-CM | POA: Diagnosis not present

## 2016-08-22 DIAGNOSIS — S79912A Unspecified injury of left hip, initial encounter: Secondary | ICD-10-CM | POA: Diagnosis not present

## 2016-08-22 DIAGNOSIS — M25552 Pain in left hip: Secondary | ICD-10-CM | POA: Diagnosis not present

## 2016-08-22 NOTE — ED Notes (Signed)
[facility] staff was called and was given report on pt's condition and discharge.

## 2016-08-22 NOTE — ED Notes (Signed)
[facility] was called for pt's transportation [facility].

## 2016-08-22 NOTE — ED Notes (Signed)
[facility] here to transport [facility] back [facility].

## 2016-08-23 ENCOUNTER — Emergency Department (HOSPITAL_COMMUNITY): Payer: Medicare Other

## 2016-08-23 ENCOUNTER — Emergency Department (HOSPITAL_COMMUNITY)
Admission: EM | Admit: 2016-08-23 | Discharge: 2016-08-24 | Disposition: A | Discharge status: Home / Self Care | Payer: Medicare Other | Source: Home / Self Care | Attending: Emergency Medicine | Admitting: Emergency Medicine

## 2016-08-23 ENCOUNTER — Encounter (HOSPITAL_COMMUNITY): Payer: Self-pay | Admitting: Family Medicine

## 2016-08-23 DIAGNOSIS — Z7982 Long term (current) use of aspirin: Secondary | ICD-10-CM | POA: Insufficient documentation

## 2016-08-23 DIAGNOSIS — Z794 Long term (current) use of insulin: Secondary | ICD-10-CM | POA: Insufficient documentation

## 2016-08-23 DIAGNOSIS — R000 Tachycardia, unspecified: Secondary | ICD-10-CM | POA: Diagnosis not present

## 2016-08-23 DIAGNOSIS — S0083XA Contusion of other part of head, initial encounter: Secondary | ICD-10-CM | POA: Insufficient documentation

## 2016-08-23 DIAGNOSIS — N390 Urinary tract infection, site not specified: Secondary | ICD-10-CM

## 2016-08-23 DIAGNOSIS — Y939 Activity, unspecified: Secondary | ICD-10-CM | POA: Insufficient documentation

## 2016-08-23 DIAGNOSIS — Z96651 Presence of right artificial knee joint: Secondary | ICD-10-CM | POA: Insufficient documentation

## 2016-08-23 DIAGNOSIS — W050XXA Fall from non-moving wheelchair, initial encounter: Secondary | ICD-10-CM

## 2016-08-23 DIAGNOSIS — Y999 Unspecified external cause status: Secondary | ICD-10-CM

## 2016-08-23 DIAGNOSIS — E1140 Type 2 diabetes mellitus with diabetic neuropathy, unspecified: Secondary | ICD-10-CM

## 2016-08-23 DIAGNOSIS — I10 Essential (primary) hypertension: Secondary | ICD-10-CM

## 2016-08-23 DIAGNOSIS — Y929 Unspecified place or not applicable: Secondary | ICD-10-CM

## 2016-08-23 DIAGNOSIS — R51 Headache: Secondary | ICD-10-CM | POA: Diagnosis not present

## 2016-08-23 DIAGNOSIS — W19XXXA Unspecified fall, initial encounter: Secondary | ICD-10-CM

## 2016-08-23 DIAGNOSIS — T1490XA Injury, unspecified, initial encounter: Secondary | ICD-10-CM | POA: Diagnosis not present

## 2016-08-23 DIAGNOSIS — G20 Parkinson's disease: Secondary | ICD-10-CM

## 2016-08-23 MED ORDER — SODIUM CHLORIDE 0.9 % IV BOLUS (SEPSIS)
1000.0000 mL | Freq: Once | INTRAVENOUS | Status: AC
  Administered 2016-08-24: 1000 mL via INTRAVENOUS

## 2016-08-23 NOTE — ED Provider Notes (Signed)
[facility]-EMERGENCY DEPT Provider Note   CSN: [id number] Arrival date & time: 08/23/16  2044  By signing my name below, I, Nelwyn SalisburyJoshua Fowler, attest that this documentation has been prepared under the direction and in the presence of Tomasita CrumbleAdeleke Evalee Gerard, MD . Electronically Signed: Nelwyn SalisburyJoshua Fowler, Scribe. 08/23/2016. 11:04 PM.  History   Chief Complaint Chief Complaint  Patient presents with  . Fall   The history is provided by the patient. No language interpreter was used.    HPI Comments:  Maureen Taylor is a 81 y.o. female brought in by ambulance, who presents to the Emergency Department complaining of sudden-onset constant right temporal head pain s/p fall occurring earlier today. She states that she was leaning over out of her wheelchair when it tilted sideways and she fell out. Per previous charts, pt has a history of frequent falls. She reports associated right hip pain and neck pain. She also reports a cough, but notes this is baseline for her. Pt denies any LOC, vomiting, dysuria or difficulty urinating  Past Medical History:  Diagnosis Date  . Allergic rhinitis   . Ascorbic acid deficiency   . Bipolar affective disorder (HCC)   . Candidiasis   . Charcot's joint   . Chronic constipation   . Dysuria   . GERD (gastroesophageal reflux disease)   . History of falling   . HLD (hyperlipidemia)   . HTN (hypertension)   . Magnesium deficiency   . Major depressive disorder   . Mixed incontinence   . Muscle weakness   . Neuropathic pain   . Neuropathy (HCC)   . OAB (overactive bladder)   . Obesity   . Oral pain   . Parkinson disease (HCC)   . Pneumonia   . Protein calorie malnutrition (HCC)   . Rosacea   . Slow transit constipation   . Type 2 diabetes mellitus with diabetic polyneuropathy, with long-term current use of insulin (HCC)   . Urinary tract infection without hematuria   . Vaginal atrophy   . Vitamin deficiency     Patient Active Problem List   Diagnosis Date Noted  .  Hyperlipidemia 06/05/2015  . Type 2 diabetes mellitus with diabetic polyneuropathy (HCC) 06/05/2015  . Vitamin D deficiency 06/05/2015  . Recurrent UTI 04/17/2015  . Vaginal atrophy 04/17/2015  . Incontinence 04/17/2015  . OAB (overactive bladder) 03/03/2015  . Bipolar depression (HCC) 01/05/2015  . GERD (gastroesophageal reflux disease) 01/05/2015  . Allergic rhinitis 01/05/2015  . Essential hypertension 01/05/2015  . Oral thrush 01/05/2015  . Parkinson's disease (HCC) 01/05/2015  . Neuropathy (HCC) 01/05/2015  . Diabetes mellitus with neuropathy (HCC) 01/05/2015    Past Surgical History:  Procedure Laterality Date  . ABDOMINAL HYSTERECTOMY    . CERVICAL CONIZATION W/BX    . CHOLECYSTECTOMY    . REVISION TOTAL KNEE ARTHROPLASTY Right     OB History    No data available       Home Medications    Prior to Admission medications   Medication Sig Start Date End Date Taking? Authorizing Provider  acetaminophen (TYLENOL) 325 MG tablet Take 650 mg by mouth every 6 (six) hours as needed for mild pain.     Historical Provider, MD  ARIPiprazole (ABILIFY) 2 MG tablet Take 2 mg by mouth daily.    Historical Provider, MD  aspirin 81 MG EC tablet Take 81 mg by mouth daily. Swallow whole.    Historical Provider, MD  atorvastatin (LIPITOR) 10 MG tablet Take 10 mg by mouth  daily.    Historical Provider, MD  benazepril (LOTENSIN) 20 MG tablet Take 20 mg by mouth daily.    Historical Provider, MD  bisacodyl (DULCOLAX) 10 MG suppository Place 10 mg rectally daily as needed for moderate constipation.    Historical Provider, MD  carbidopa-levodopa (PARCOPA) 25-100 MG disintegrating tablet Take 1 tablet by mouth 2 (two) times daily.     Historical Provider, MD  conjugated estrogens (PREMARIN) vaginal cream Place 1 Applicatorful vaginally daily. Apply 0.5mg  (pea-sized amount)  just inside the vaginal introitus with a finger-tip every night for two weeks and then Monday, Wednesday and Friday nights.  02/25/16   Carollee HerterShannon A McGowan, PA-C  Cranberry 200 MG CAPS Take 400 mg by mouth daily. Take two 200-mg capsules = 400 mg    Historical Provider, MD  DULoxetine (CYMBALTA) 60 MG capsule Take 60 mg by mouth daily.    Historical Provider, MD  gabapentin (NEURONTIN) 100 MG capsule Take 200 mg by mouth 2 (two) times daily. Take two 100 mg capsules to = 200 mg PO BID    Historical Provider, MD  glimepiride (AMARYL) 4 MG tablet Take 4 mg by mouth daily with breakfast.    Historical Provider, MD  insulin aspart (NOVOLOG) 100 UNIT/ML injection Inject 6 Units into the skin 2 (two) times daily as needed for high blood sugar. If CBG >400, Give NOVOLOG 8 UNITS SQ BID PRN     Historical Provider, MD  insulin glargine (LANTUS) 100 UNIT/ML injection Inject 35 Units into the skin 2 (two) times daily. QAM and QHS    Historical Provider, MD  lamoTRIgine (LAMICTAL) 100 MG tablet Take 100 mg by mouth 2 (two) times daily.     Historical Provider, MD  loperamide (IMODIUM) 2 MG capsule Take 2 tablets = 4mg  by mouth initially then 1 tablet =2mg  after each episode of diarrhea. DO NOT EXCEED 16MG  IN 24 HOUR PERIOD IF DIARRHEA PERSISTS MORE.    Historical Provider, MD  loratadine (CLARITIN) 10 MG tablet Take 10 mg by mouth daily.     Historical Provider, MD  magnesium oxide (MAG-OX) 400 MG tablet Take 400 mg by mouth daily.    Historical Provider, MD  metoprolol tartrate (LOPRESSOR) 25 MG tablet Take 25 mg by mouth 2 (two) times daily. Take 25 mg by mouth twice a day for HTN    Historical Provider, MD  miconazole (MONISTAT 7) 2 % vaginal cream Place 1 Applicatorful vaginally 2 (two) times daily.    Historical Provider, MD  oxybutynin (DITROPAN) 5 MG tablet Take 5 mg by mouth 3 (three) times daily.    Historical Provider, MD  pantoprazole (PROTONIX) 20 MG tablet Take 20 mg by mouth at bedtime.    Historical Provider, MD  pantoprazole (PROTONIX) 40 MG tablet Take 40 mg by mouth daily.     Historical Provider, MD  polyethylene glycol  (MIRALAX / GLYCOLAX) packet Take 17 g by mouth 2 (two) times daily.    Historical Provider, MD  Propylene Glycol (SYSTANE BALANCE OP) Apply 1 drop to eye 2 (two) times daily. Both eyes    Historical Provider, MD  Protein (PROCEL PO) Take 1 scoop by mouth 2 (two) times daily.     Historical Provider, MD  sennosides-docusate sodium (SENOKOT-S) 8.6-50 MG tablet Take 2 tablets by mouth 2 (two) times daily.    Historical Provider, MD    Family History Family History  Problem Relation Age of Onset  . Kidney disease Neg Hx   . Bladder Cancer  Neg Hx   . Prostate cancer Neg Hx     Social History Social History  Substance Use Topics  . Smoking status: Never Smoker  . Smokeless tobacco: Never Used  . Alcohol use No     Allergies   Codeine; Morphine and related; and Indocin [indomethacin]   Review of Systems Review of Systems 10 Systems reviewed and are negative for acute change except as noted in the HPI.  Physical Exam Updated Vital Signs BP 137/69 (BP Location: Right Arm)   Pulse 101   Temp 98.6 F (37 C) (Oral)   Resp 20   Ht 5\' 5"  (1.651 m)   Wt 245 lb (111.1 kg)   SpO2 95%   BMI 40.77 kg/m   Physical Exam  Constitutional: She is oriented to person, place, and time. She appears well-developed and well-nourished. No distress.  HENT:  Head: Normocephalic and atraumatic.  Nose: Nose normal.  Mouth/Throat: Oropharynx is clear and moist. No oropharyngeal exudate.  Eyes: Conjunctivae and EOM are normal. Pupils are equal, round, and reactive to light. No scleral icterus.  Neck: Neck supple. No JVD present. No tracheal deviation present. No thyromegaly present.  Towel C collar around neck  Cardiovascular: Regular rhythm and normal heart sounds.  Tachycardia present.  Exam reveals no gallop and no friction rub.   No murmur heard. Pulmonary/Chest: Effort normal and breath sounds normal. No respiratory distress. She has no wheezes. She exhibits no tenderness.  Abdominal: Soft.  Bowel sounds are normal. She exhibits no distension and no mass. There is no tenderness. There is no rebound and no guarding.  Musculoskeletal: Normal range of motion. She exhibits no edema or tenderness.  No tenderness to palpation of right hip.  Lymphadenopathy:    She has no cervical adenopathy.  Neurological: She is alert and oriented to person, place, and time. No cranial nerve deficit. She exhibits normal muscle tone.  Good strength and sensation in extremities.  Skin: Skin is warm and dry. No rash noted. No erythema. No pallor.  Old reparied laceration above left eyebrow with stitches in place. Clean, dry and intact. Soft tissue hematoma above right eyebrow.   Nursing note and vitals reviewed.    ED Treatments / Results  DIAGNOSTIC STUDIES:  Oxygen Saturation is 93% on RA, low by my interpretation.    COORDINATION OF CARE:  11:10 PM Discussed treatment plan with pt at bedside which includes imaging and pt agreed to plan.  Labs (all labs ordered are listed, but only abnormal results are displayed) Labs Reviewed  CBC WITH DIFFERENTIAL/PLATELET - Abnormal; Notable for the following:       Result Value   WBC 11.3 (*)    Neutro Abs 8.0 (*)    Monocytes Absolute 1.3 (*)    All other components within normal limits  BASIC METABOLIC PANEL - Abnormal; Notable for the following:    Glucose, Bld 289 (*)    Creatinine, Ser 1.01 (*)    GFR calc non Af Amer 52 (*)    GFR calc Af Amer 60 (*)    All other components within normal limits  URINALYSIS, ROUTINE W REFLEX MICROSCOPIC (NOT AT [facility]) - Abnormal; Notable for the following:    APPearance TURBID (*)    Glucose, UA 500 (*)    Hgb urine dipstick LARGE (*)    Protein, ur 100 (*)    Leukocytes, UA LARGE (*)    All other components within normal limits  URINE MICROSCOPIC-ADD ON - Abnormal; Notable for  the following:    Bacteria, UA MANY (*)    All other components within normal limits  URINE CULTURE    EKG  EKG  Interpretation None       Radiology Dg Chest 2 View  Result Date: 08/24/2016 CLINICAL DATA:  Status post fall, with decreased O2 saturation. Initial encounter. EXAM: CHEST  2 VIEW COMPARISON:  Chest radiograph from 05/30/2014 FINDINGS: The lungs are hypoexpanded. Vascular congestion is noted. There is no evidence of focal opacification, pleural effusion or pneumothorax. The heart is mildly enlarged. No acute osseous abnormalities are seen. IMPRESSION: Lungs hypoexpanded. Vascular congestion and mild cardiomegaly. No displaced rib fractures identified. Electronically Signed   By: Roanna Raider M.D.   On: 08/24/2016 01:03   Ct Head Wo Contrast  Result Date: 08/24/2016 CLINICAL DATA:  Status post fall with right-sided headache EXAM: CT HEAD WITHOUT CONTRAST CT MAXILLOFACIAL WITHOUT CONTRAST CT CERVICAL SPINE WITHOUT CONTRAST TECHNIQUE: Multidetector CT imaging of the head, cervical spine, and maxillofacial structures were performed using the standard protocol without intravenous contrast. Multiplanar CT image reconstructions of the cervical spine and maxillofacial structures were also generated. COMPARISON:  CT head and cervical spine 08/19/2016 FINDINGS: CT HEAD FINDINGS Brain: No mass lesion, intraparenchymal hemorrhage or extra-axial collection. No evidence of acute cortical infarct. There is periventricular hypoattenuation compatible with chronic microvascular disease. Old right centrum semiovale lacunar infarct. Vascular: Mild carotid and vertebral artery calcification at the skullbase. Skull: No skull fracture identified. Medium-sized right frontal scalp hematoma/laceration. CT MAXILLOFACIAL FINDINGS Osseous: --Complex facial fracture types: No LeFort, zygomaticomaxillary complex or nasoorbitoethmoidal fracture. --Simple fracture types: None. --Mandible: No fracture or dislocation. Orbits: The globes appear intact. Normal appearance of the intra- and extraconal fat. Symmetric extraocular muscles.  Sinuses: No fluid levels or advanced mucosal thickening. Soft tissues: Normal visualized extracranial facial soft tissues. CT CERVICAL SPINE FINDINGS Alignment: No acute static subluxation. Facets are aligned. Occipital condyles are normally positioned. Unchanged grade 1 C4-5 anterolisthesis. Skull base and vertebrae: No acute fracture. Soft tissues and spinal canal: No prevertebral fluid or swelling. No visible canal hematoma. Disc levels: Multilevel facet hypertrophy and fusion at the C5-T1 levels. No osseous spinal canal stenosis. Upper chest: No pneumothorax, pulmonary nodule or pleural effusion. Other: There is a left level 3 lymph node measuring 1.1 cm, new from the prior study (series 8, image 42). Enlarged lymph nodes are also noted right level 2. IMPRESSION: 1. No acute intracranial abnormality. 2. Medium-sized right frontal scalp laceration/hematoma. No underlying skull fracture or facial fracture. 3. No acute fracture or static subluxation of the cervical spine. 4. New cervical lymphadenopathy compared to 08/19/2016, of uncertain significance. Electronically Signed   By: Deatra Robinson M.D.   On: 08/24/2016 00:57   Ct Cervical Spine Wo Contrast  Result Date: 08/24/2016 CLINICAL DATA:  Status post fall with right-sided headache EXAM: CT HEAD WITHOUT CONTRAST CT MAXILLOFACIAL WITHOUT CONTRAST CT CERVICAL SPINE WITHOUT CONTRAST TECHNIQUE: Multidetector CT imaging of the head, cervical spine, and maxillofacial structures were performed using the standard protocol without intravenous contrast. Multiplanar CT image reconstructions of the cervical spine and maxillofacial structures were also generated. COMPARISON:  CT head and cervical spine 08/19/2016 FINDINGS: CT HEAD FINDINGS Brain: No mass lesion, intraparenchymal hemorrhage or extra-axial collection. No evidence of acute cortical infarct. There is periventricular hypoattenuation compatible with chronic microvascular disease. Old right centrum semiovale  lacunar infarct. Vascular: Mild carotid and vertebral artery calcification at the skullbase. Skull: No skull fracture identified. Medium-sized right frontal scalp hematoma/laceration. CT MAXILLOFACIAL FINDINGS  Osseous: --Complex facial fracture types: No LeFort, zygomaticomaxillary complex or nasoorbitoethmoidal fracture. --Simple fracture types: None. --Mandible: No fracture or dislocation. Orbits: The globes appear intact. Normal appearance of the intra- and extraconal fat. Symmetric extraocular muscles. Sinuses: No fluid levels or advanced mucosal thickening. Soft tissues: Normal visualized extracranial facial soft tissues. CT CERVICAL SPINE FINDINGS Alignment: No acute static subluxation. Facets are aligned. Occipital condyles are normally positioned. Unchanged grade 1 C4-5 anterolisthesis. Skull base and vertebrae: No acute fracture. Soft tissues and spinal canal: No prevertebral fluid or swelling. No visible canal hematoma. Disc levels: Multilevel facet hypertrophy and fusion at the C5-T1 levels. No osseous spinal canal stenosis. Upper chest: No pneumothorax, pulmonary nodule or pleural effusion. Other: There is a left level 3 lymph node measuring 1.1 cm, new from the prior study (series 8, image 42). Enlarged lymph nodes are also noted right level 2. IMPRESSION: 1. No acute intracranial abnormality. 2. Medium-sized right frontal scalp laceration/hematoma. No underlying skull fracture or facial fracture. 3. No acute fracture or static subluxation of the cervical spine. 4. New cervical lymphadenopathy compared to 08/19/2016, of uncertain significance. Electronically Signed   By: Deatra Robinson M.D.   On: 08/24/2016 00:57   Ct Hip Right Wo Contrast  Result Date: 08/24/2016 CLINICAL DATA:  Status post fall, with right hip pain. Initial encounter. EXAM: CT OF THE RIGHT HIP WITHOUT CONTRAST TECHNIQUE: Multidetector CT imaging of the right hip was performed according to the standard protocol. Multiplanar CT  image reconstructions were also generated. COMPARISON:  Pelvis radiograph performed 08/22/2016 FINDINGS: Bones/Joint/Cartilage There is no evidence of fracture or dislocation. The patient's right hip hemiarthroplasty is grossly unremarkable in appearance, without evidence of loosening. Mild underlying heterotopic bone formation is noted at the right acetabulum. Mild degenerative change is noted at the pubic symphysis. Ligaments Suboptimally assessed by CT. Muscles and Tendons The visualized musculature is grossly unremarkable. No tendon abnormalities are characterized. Soft tissues Visualized bowel loops are grossly unremarkable in appearance. The bladder is mildly distended and unremarkable in appearance. Scattered vascular calcifications are seen. IMPRESSION: 1. No evidence of fracture or dislocation. 2. Right hip arthroplasty is unremarkable in appearance, without evidence of loosening. 3. Scattered vascular calcifications seen. Electronically Signed   By: Roanna Raider M.D.   On: 08/24/2016 01:00   Ct Maxillofacial Wo Contrast  Result Date: 08/24/2016 CLINICAL DATA:  Status post fall with right-sided headache EXAM: CT HEAD WITHOUT CONTRAST CT MAXILLOFACIAL WITHOUT CONTRAST CT CERVICAL SPINE WITHOUT CONTRAST TECHNIQUE: Multidetector CT imaging of the head, cervical spine, and maxillofacial structures were performed using the standard protocol without intravenous contrast. Multiplanar CT image reconstructions of the cervical spine and maxillofacial structures were also generated. COMPARISON:  CT head and cervical spine 08/19/2016 FINDINGS: CT HEAD FINDINGS Brain: No mass lesion, intraparenchymal hemorrhage or extra-axial collection. No evidence of acute cortical infarct. There is periventricular hypoattenuation compatible with chronic microvascular disease. Old right centrum semiovale lacunar infarct. Vascular: Mild carotid and vertebral artery calcification at the skullbase. Skull: No skull fracture  identified. Medium-sized right frontal scalp hematoma/laceration. CT MAXILLOFACIAL FINDINGS Osseous: --Complex facial fracture types: No LeFort, zygomaticomaxillary complex or nasoorbitoethmoidal fracture. --Simple fracture types: None. --Mandible: No fracture or dislocation. Orbits: The globes appear intact. Normal appearance of the intra- and extraconal fat. Symmetric extraocular muscles. Sinuses: No fluid levels or advanced mucosal thickening. Soft tissues: Normal visualized extracranial facial soft tissues. CT CERVICAL SPINE FINDINGS Alignment: No acute static subluxation. Facets are aligned. Occipital condyles are normally positioned. Unchanged grade 1 C4-5 anterolisthesis.  Skull base and vertebrae: No acute fracture. Soft tissues and spinal canal: No prevertebral fluid or swelling. No visible canal hematoma. Disc levels: Multilevel facet hypertrophy and fusion at the C5-T1 levels. No osseous spinal canal stenosis. Upper chest: No pneumothorax, pulmonary nodule or pleural effusion. Other: There is a left level 3 lymph node measuring 1.1 cm, new from the prior study (series 8, image 42). Enlarged lymph nodes are also noted right level 2. IMPRESSION: 1. No acute intracranial abnormality. 2. Medium-sized right frontal scalp laceration/hematoma. No underlying skull fracture or facial fracture. 3. No acute fracture or static subluxation of the cervical spine. 4. New cervical lymphadenopathy compared to 08/19/2016, of uncertain significance. Electronically Signed   By: Deatra Robinson M.D.   On: 08/24/2016 00:57    Procedures Procedures (including critical care time)  Medications Ordered in ED Medications  cephALEXin (KEFLEX) capsule 500 mg (not administered)  sodium chloride 0.9 % bolus 1,000 mL (1,000 mLs Intravenous New Bag/Given 08/24/16 0057)     Initial Impression / Assessment and Plan / ED Course  I have reviewed the triage vital signs and the nursing notes.  Pertinent labs & imaging results that  were available during my care of the patient were reviewed by me and considered in my medical decision making (see chart for details).  Clinical Course    Patient presents to the ED for a fall.  She has obvious deformity on her face, needing CT scan for further evaluation.  She also has had a cough.  I see no prior falls before one week ago.  Will obtain labs and infectious work up to ensure there is not any other cause of this.  Also CT of the hip as she is still having pain and her recent xr is neg.  1:13 AM CT scans ar negative for acute injury.  CXR neg for pneumonia.  Labs and urine are pending.  2:09 AM UA does reveal significant infection.  Culture sent.  Perhaps the cause of her recently falling several times.  She was given keflex in the ED and will send home with Rx for treatment.  She appears well and in NAD. Vs remain within her normal limits, tachycardia resolved after IVF, patient is safe for Dc.   Final Clinical Impressions(s) / ED Diagnoses   Final diagnoses:  None    New Prescriptions New Prescriptions   No medications on file    I personally performed the services described in this documentation, which was scribed in my presence. The recorded information has been reviewed and is accurate.       Tomasita Crumble, MD 08/24/16 [phone]

## 2016-08-23 NOTE — ED Triage Notes (Signed)
[facility] Patient had an unwitnessed fall but her room mate heard her fall. Pt was sitting in a chair that was in the bathroom. She reached to the right to get something and fell. Denies any LOC but reports right hip pain and neck pain. Pt was seen yesterday for a fall where she has left sided facial injuries.

## 2016-08-23 NOTE — ED Notes (Signed)
Bed: WA06 Expected date:  Expected time:  Means of arrival:  Comments: 81 yr old fall, hematoma over eye, neck and hip pain

## 2016-08-23 NOTE — ED Notes (Signed)
Patient in radiology

## 2016-08-24 ENCOUNTER — Emergency Department (HOSPITAL_COMMUNITY): Payer: Medicare Other

## 2016-08-24 ENCOUNTER — Inpatient Hospital Stay (HOSPITAL_COMMUNITY)
Admission: EM | Admit: 2016-08-24 | Discharge: 2016-08-27 | DRG: 690 | Disposition: A | Discharge status: Skilled Nursing Facility | Payer: Medicare Other | Attending: Internal Medicine | Admitting: Internal Medicine

## 2016-08-24 ENCOUNTER — Encounter (HOSPITAL_COMMUNITY): Payer: Self-pay | Admitting: Nurse Practitioner

## 2016-08-24 DIAGNOSIS — I10 Essential (primary) hypertension: Secondary | ICD-10-CM | POA: Diagnosis not present

## 2016-08-24 DIAGNOSIS — E1140 Type 2 diabetes mellitus with diabetic neuropathy, unspecified: Secondary | ICD-10-CM | POA: Diagnosis not present

## 2016-08-24 DIAGNOSIS — Z90710 Acquired absence of both cervix and uterus: Secondary | ICD-10-CM | POA: Diagnosis not present

## 2016-08-24 DIAGNOSIS — M25551 Pain in right hip: Secondary | ICD-10-CM | POA: Diagnosis not present

## 2016-08-24 DIAGNOSIS — F319 Bipolar disorder, unspecified: Secondary | ICD-10-CM | POA: Diagnosis present

## 2016-08-24 DIAGNOSIS — Z888 Allergy status to other drugs, medicaments and biological substances status: Secondary | ICD-10-CM

## 2016-08-24 DIAGNOSIS — N3281 Overactive bladder: Secondary | ICD-10-CM | POA: Diagnosis present

## 2016-08-24 DIAGNOSIS — K5900 Constipation, unspecified: Secondary | ICD-10-CM | POA: Diagnosis not present

## 2016-08-24 DIAGNOSIS — Z96651 Presence of right artificial knee joint: Secondary | ICD-10-CM | POA: Diagnosis present

## 2016-08-24 DIAGNOSIS — E11610 Type 2 diabetes mellitus with diabetic neuropathic arthropathy: Secondary | ICD-10-CM | POA: Diagnosis present

## 2016-08-24 DIAGNOSIS — I119 Hypertensive heart disease without heart failure: Secondary | ICD-10-CM | POA: Diagnosis present

## 2016-08-24 DIAGNOSIS — Z87440 Personal history of urinary (tract) infections: Secondary | ICD-10-CM

## 2016-08-24 DIAGNOSIS — R404 Transient alteration of awareness: Secondary | ICD-10-CM | POA: Diagnosis not present

## 2016-08-24 DIAGNOSIS — Z885 Allergy status to narcotic agent status: Secondary | ICD-10-CM

## 2016-08-24 DIAGNOSIS — N3000 Acute cystitis without hematuria: Secondary | ICD-10-CM | POA: Diagnosis not present

## 2016-08-24 DIAGNOSIS — J9811 Atelectasis: Secondary | ICD-10-CM | POA: Diagnosis not present

## 2016-08-24 DIAGNOSIS — Z7982 Long term (current) use of aspirin: Secondary | ICD-10-CM

## 2016-08-24 DIAGNOSIS — R410 Disorientation, unspecified: Secondary | ICD-10-CM | POA: Diagnosis not present

## 2016-08-24 DIAGNOSIS — S299XXA Unspecified injury of thorax, initial encounter: Secondary | ICD-10-CM | POA: Diagnosis not present

## 2016-08-24 DIAGNOSIS — R4182 Altered mental status, unspecified: Secondary | ICD-10-CM | POA: Diagnosis not present

## 2016-08-24 DIAGNOSIS — G20 Parkinson's disease: Secondary | ICD-10-CM | POA: Diagnosis present

## 2016-08-24 DIAGNOSIS — B379 Candidiasis, unspecified: Secondary | ICD-10-CM

## 2016-08-24 DIAGNOSIS — E1142 Type 2 diabetes mellitus with diabetic polyneuropathy: Secondary | ICD-10-CM | POA: Diagnosis present

## 2016-08-24 DIAGNOSIS — E785 Hyperlipidemia, unspecified: Secondary | ICD-10-CM | POA: Diagnosis present

## 2016-08-24 DIAGNOSIS — S79911A Unspecified injury of right hip, initial encounter: Secondary | ICD-10-CM | POA: Diagnosis not present

## 2016-08-24 DIAGNOSIS — Z79899 Other long term (current) drug therapy: Secondary | ICD-10-CM

## 2016-08-24 DIAGNOSIS — S199XXA Unspecified injury of neck, initial encounter: Secondary | ICD-10-CM | POA: Diagnosis not present

## 2016-08-24 DIAGNOSIS — K219 Gastro-esophageal reflux disease without esophagitis: Secondary | ICD-10-CM | POA: Diagnosis present

## 2016-08-24 DIAGNOSIS — Z6841 Body Mass Index (BMI) 40.0 and over, adult: Secondary | ICD-10-CM | POA: Diagnosis not present

## 2016-08-24 DIAGNOSIS — R488 Other symbolic dysfunctions: Secondary | ICD-10-CM | POA: Diagnosis not present

## 2016-08-24 DIAGNOSIS — M7989 Other specified soft tissue disorders: Secondary | ICD-10-CM | POA: Diagnosis not present

## 2016-08-24 DIAGNOSIS — G629 Polyneuropathy, unspecified: Secondary | ICD-10-CM | POA: Diagnosis present

## 2016-08-24 DIAGNOSIS — Z9181 History of falling: Secondary | ICD-10-CM | POA: Diagnosis not present

## 2016-08-24 DIAGNOSIS — Z794 Long term (current) use of insulin: Secondary | ICD-10-CM | POA: Diagnosis not present

## 2016-08-24 DIAGNOSIS — S0993XA Unspecified injury of face, initial encounter: Secondary | ICD-10-CM | POA: Diagnosis not present

## 2016-08-24 DIAGNOSIS — N390 Urinary tract infection, site not specified: Secondary | ICD-10-CM | POA: Diagnosis not present

## 2016-08-24 DIAGNOSIS — B9620 Unspecified Escherichia coli [E. coli] as the cause of diseases classified elsewhere: Secondary | ICD-10-CM | POA: Diagnosis present

## 2016-08-24 DIAGNOSIS — R2689 Other abnormalities of gait and mobility: Secondary | ICD-10-CM | POA: Diagnosis not present

## 2016-08-24 DIAGNOSIS — R296 Repeated falls: Secondary | ICD-10-CM | POA: Diagnosis present

## 2016-08-24 DIAGNOSIS — R259 Unspecified abnormal involuntary movements: Secondary | ICD-10-CM | POA: Diagnosis not present

## 2016-08-24 DIAGNOSIS — W050XXA Fall from non-moving wheelchair, initial encounter: Secondary | ICD-10-CM | POA: Diagnosis present

## 2016-08-24 DIAGNOSIS — F3130 Bipolar disorder, current episode depressed, mild or moderate severity, unspecified: Secondary | ICD-10-CM | POA: Diagnosis not present

## 2016-08-24 DIAGNOSIS — S0990XA Unspecified injury of head, initial encounter: Secondary | ICD-10-CM | POA: Diagnosis not present

## 2016-08-24 DIAGNOSIS — T361X6A Underdosing of cephalosporins and other beta-lactam antibiotics, initial encounter: Secondary | ICD-10-CM | POA: Diagnosis present

## 2016-08-24 DIAGNOSIS — R51 Headache: Secondary | ICD-10-CM | POA: Diagnosis not present

## 2016-08-24 DIAGNOSIS — R531 Weakness: Secondary | ICD-10-CM | POA: Diagnosis not present

## 2016-08-24 DIAGNOSIS — S0083XA Contusion of other part of head, initial encounter: Secondary | ICD-10-CM | POA: Diagnosis present

## 2016-08-24 DIAGNOSIS — G20A1 Parkinson's disease without dyskinesia, without mention of fluctuations: Secondary | ICD-10-CM | POA: Diagnosis present

## 2016-08-24 LAB — URINE MICROSCOPIC-ADD ON: SQUAMOUS EPITHELIAL / LPF: NONE SEEN

## 2016-08-24 LAB — PROTIME-INR
INR: 1.1
Prothrombin Time: 14.2 seconds (ref 11.4–15.2)

## 2016-08-24 LAB — URINALYSIS, ROUTINE W REFLEX MICROSCOPIC
BILIRUBIN URINE: NEGATIVE
Bilirubin Urine: NEGATIVE
Glucose, UA: 500 mg/dL — AB
Glucose, UA: 100 mg/dL — AB
KETONES UR: NEGATIVE mg/dL
Ketones, ur: NEGATIVE mg/dL
NITRITE: NEGATIVE
Nitrite: NEGATIVE
PROTEIN: 100 mg/dL — AB
Protein, ur: 100 mg/dL — AB
SPECIFIC GRAVITY, URINE: 1.018 (ref 1.005–1.030)
Specific Gravity, Urine: 1.019 (ref 1.005–1.030)
pH: 5 (ref 5.0–8.0)
pH: 5.5 (ref 5.0–8.0)

## 2016-08-24 LAB — BASIC METABOLIC PANEL
Anion gap: 9 (ref 5–15)
BUN: 17 mg/dL (ref 6–20)
CALCIUM: 9.2 mg/dL (ref 8.9–10.3)
CO2: 26 mmol/L (ref 22–32)
CREATININE: 1.01 mg/dL — AB (ref 0.44–1.00)
Chloride: 101 mmol/L (ref 101–111)
GFR calc non Af Amer: 52 mL/min — ABNORMAL LOW (ref >60)
GFR, EST AFRICAN AMERICAN: 60 mL/min — AB (ref >60)
Glucose, Bld: 289 mg/dL — ABNORMAL HIGH (ref 65–99)
Potassium: 4.2 mmol/L (ref 3.5–5.1)
SODIUM: 136 mmol/L (ref 135–145)

## 2016-08-24 LAB — CBC WITH DIFFERENTIAL/PLATELET
BASOS ABS: 0 10*3/uL (ref 0.0–0.1)
BASOS PCT: 0 %
Basophils Absolute: 0 10*3/uL (ref 0.0–0.1)
Basophils Relative: 0 %
EOS ABS: 0.1 10*3/uL (ref 0.0–0.7)
EOS ABS: 0.1 10*3/uL (ref 0.0–0.7)
EOS PCT: 1 %
Eosinophils Relative: 1 %
HCT: 40.2 % (ref 36.0–46.0)
HCT: 39.6 % (ref 36.0–46.0)
Hemoglobin: 13.4 g/dL (ref 12.0–15.0)
Hemoglobin: 13.1 g/dL (ref 12.0–15.0)
Lymphocytes Relative: 17 %
Lymphocytes Relative: 15 %
Lymphs Abs: 2 10*3/uL (ref 0.7–4.0)
Lymphs Abs: 1.6 10*3/uL (ref 0.7–4.0)
MCH: 30.5 pg (ref 26.0–34.0)
MCH: 30 pg (ref 26.0–34.0)
MCHC: 33.3 g/dL (ref 30.0–36.0)
MCHC: 33.1 g/dL (ref 30.0–36.0)
MCV: 91.4 fL (ref 78.0–100.0)
MCV: 90.8 fL (ref 78.0–100.0)
MONO ABS: 1.3 10*3/uL — AB (ref 0.1–1.0)
MONOS PCT: 11 %
Monocytes Absolute: 1.3 10*3/uL — ABNORMAL HIGH (ref 0.1–1.0)
Monocytes Relative: 12 %
Neutro Abs: 8 10*3/uL — ABNORMAL HIGH (ref 1.7–7.7)
Neutro Abs: 8 10*3/uL — ABNORMAL HIGH (ref 1.7–7.7)
Neutrophils Relative %: 71 %
Neutrophils Relative %: 72 %
PLATELETS: 235 10*3/uL (ref 150–400)
PLATELETS: 227 10*3/uL (ref 150–400)
RBC: 4.4 MIL/uL (ref 3.87–5.11)
RBC: 4.36 MIL/uL (ref 3.87–5.11)
RDW: 13.8 % (ref 11.5–15.5)
RDW: 13.9 % (ref 11.5–15.5)
WBC: 11.3 10*3/uL — ABNORMAL HIGH (ref 4.0–10.5)
WBC: 11 10*3/uL — AB (ref 4.0–10.5)

## 2016-08-24 LAB — I-STAT CG4 LACTIC ACID, ED: Lactic Acid, Venous: 1.58 mmol/L (ref 0.5–1.9)

## 2016-08-24 LAB — COMPREHENSIVE METABOLIC PANEL
ALT: 9 U/L — AB (ref 14–54)
AST: 22 U/L (ref 15–41)
Albumin: 3.1 g/dL — ABNORMAL LOW (ref 3.5–5.0)
Alkaline Phosphatase: 91 U/L (ref 38–126)
Anion gap: 9 (ref 5–15)
BILIRUBIN TOTAL: 0.7 mg/dL (ref 0.3–1.2)
BUN: 14 mg/dL (ref 6–20)
CALCIUM: 8.4 mg/dL — AB (ref 8.9–10.3)
CO2: 24 mmol/L (ref 22–32)
CREATININE: 0.8 mg/dL (ref 0.44–1.00)
Chloride: 102 mmol/L (ref 101–111)
GFR calc Af Amer: >60 mL/min (ref >60)
Glucose, Bld: 205 mg/dL — ABNORMAL HIGH (ref 65–99)
Potassium: 4 mmol/L (ref 3.5–5.1)
Sodium: 135 mmol/L (ref 135–145)
TOTAL PROTEIN: 6.7 g/dL (ref 6.5–8.1)

## 2016-08-24 LAB — TROPONIN I

## 2016-08-24 LAB — GLUCOSE, CAPILLARY: Glucose-Capillary: 131 mg/dL — ABNORMAL HIGH (ref 65–99)

## 2016-08-24 MED ORDER — GABAPENTIN 100 MG PO CAPS
200.0000 mg | ORAL_CAPSULE | Freq: Two times a day (BID) | ORAL | Status: DC
  Administered 2016-08-24 – 2016-08-27 (×6): 200 mg via ORAL
  Filled 2016-08-24 (×6): qty 2

## 2016-08-24 MED ORDER — SENNOSIDES-DOCUSATE SODIUM 8.6-50 MG PO TABS
2.0000 | ORAL_TABLET | Freq: Two times a day (BID) | ORAL | Status: DC
  Administered 2016-08-24 – 2016-08-25 (×2): 2 via ORAL
  Administered 2016-08-25: 1 via ORAL
  Administered 2016-08-26 – 2016-08-27 (×3): 2 via ORAL
  Filled 2016-08-24 (×8): qty 2

## 2016-08-24 MED ORDER — BENAZEPRIL HCL 20 MG PO TABS
20.0000 mg | ORAL_TABLET | Freq: Every day | ORAL | Status: DC
  Administered 2016-08-25 – 2016-08-27 (×3): 20 mg via ORAL
  Filled 2016-08-24 (×3): qty 1

## 2016-08-24 MED ORDER — CEPHALEXIN 500 MG PO CAPS: 500.0000 mg | ORAL_CAPSULE | Freq: Two times a day (BID) | ORAL | 0 refills | Status: DC

## 2016-08-24 MED ORDER — CEPHALEXIN 500 MG PO CAPS: 500.0000 mg | ORAL_CAPSULE | Freq: Once | ORAL | Status: DC

## 2016-08-24 MED ORDER — DULOXETINE HCL 60 MG PO CPEP
60.0000 mg | ORAL_CAPSULE | Freq: Every day | ORAL | Status: DC
  Administered 2016-08-25 – 2016-08-27 (×3): 60 mg via ORAL
  Filled 2016-08-24 (×3): qty 1

## 2016-08-24 MED ORDER — INSULIN ASPART 100 UNIT/ML ~~LOC~~ SOLN: 0.0000 [IU] | Freq: Every day | SUBCUTANEOUS | Status: DC

## 2016-08-24 MED ORDER — ACETAMINOPHEN 325 MG PO TABS
650.0000 mg | ORAL_TABLET | Freq: Four times a day (QID) | ORAL | Status: DC | PRN
  Administered 2016-08-26: 650 mg via ORAL
  Filled 2016-08-24: qty 2

## 2016-08-24 MED ORDER — ARIPIPRAZOLE 2 MG PO TABS
2.0000 mg | ORAL_TABLET | Freq: Every day | ORAL | Status: DC
Start: 2016-08-24 — End: 2016-08-27
  Administered 2016-08-24 – 2016-08-27 (×4): 2 mg via ORAL
  Filled 2016-08-24 (×4): qty 1

## 2016-08-24 MED ORDER — DEXTROSE 5 % IV SOLN
1.0000 g | INTRAVENOUS | Status: DC
  Administered 2016-08-25 – 2016-08-27 (×3): 1 g via INTRAVENOUS
  Filled 2016-08-24 (×3): qty 10

## 2016-08-24 MED ORDER — ENOXAPARIN SODIUM 40 MG/0.4ML ~~LOC~~ SOLN
40.0000 mg | SUBCUTANEOUS | Status: DC
  Administered 2016-08-24 – 2016-08-26 (×3): 40 mg via SUBCUTANEOUS
  Filled 2016-08-24 (×3): qty 0.4

## 2016-08-24 MED ORDER — NYSTATIN 100000 UNIT/GM EX POWD
Freq: Two times a day (BID) | CUTANEOUS | Status: DC
  Administered 2016-08-24: 19:00:00 via TOPICAL
  Administered 2016-08-25: 1 via TOPICAL
  Administered 2016-08-25 – 2016-08-26 (×2): via TOPICAL
  Administered 2016-08-26: 1 via TOPICAL
  Administered 2016-08-27: 10:00:00 via TOPICAL
  Filled 2016-08-24: qty 15

## 2016-08-24 MED ORDER — ATORVASTATIN CALCIUM 10 MG PO TABS
10.0000 mg | ORAL_TABLET | Freq: Every day | ORAL | Status: DC
  Administered 2016-08-25 – 2016-08-27 (×3): 10 mg via ORAL
  Filled 2016-08-24 (×3): qty 1

## 2016-08-24 MED ORDER — ASPIRIN 81 MG PO CHEW
81.0000 mg | CHEWABLE_TABLET | Freq: Every day | ORAL | Status: DC
  Administered 2016-08-25 – 2016-08-27 (×3): 81 mg via ORAL
  Filled 2016-08-24 (×3): qty 1

## 2016-08-24 MED ORDER — LAMOTRIGINE 100 MG PO TABS
100.0000 mg | ORAL_TABLET | Freq: Two times a day (BID) | ORAL | Status: DC
  Administered 2016-08-24 – 2016-08-27 (×6): 100 mg via ORAL
  Filled 2016-08-24 (×6): qty 1

## 2016-08-24 MED ORDER — DEXTROSE 5 % IV SOLN
1.0000 g | Freq: Once | INTRAVENOUS | Status: AC
  Administered 2016-08-24: 1 g via INTRAVENOUS
  Filled 2016-08-24: qty 10

## 2016-08-24 MED ORDER — INSULIN ASPART 100 UNIT/ML ~~LOC~~ SOLN
0.0000 [IU] | Freq: Three times a day (TID) | SUBCUTANEOUS | Status: DC
  Administered 2016-08-25 (×3): 2 [IU] via SUBCUTANEOUS
  Administered 2016-08-26: 5 [IU] via SUBCUTANEOUS
  Administered 2016-08-26 – 2016-08-27 (×2): 3 [IU] via SUBCUTANEOUS
  Administered 2016-08-27: 2 [IU] via SUBCUTANEOUS

## 2016-08-24 MED ORDER — PANTOPRAZOLE SODIUM 20 MG PO TBEC
20.0000 mg | DELAYED_RELEASE_TABLET | Freq: Every day | ORAL | Status: DC
  Administered 2016-08-24 – 2016-08-26 (×3): 20 mg via ORAL
  Filled 2016-08-24 (×3): qty 1

## 2016-08-24 MED ORDER — ACETAMINOPHEN 650 MG RE SUPP: 650.0000 mg | Freq: Four times a day (QID) | RECTAL | Status: DC | PRN

## 2016-08-24 MED ORDER — INSULIN GLARGINE 100 UNIT/ML ~~LOC~~ SOLN
20.0000 [IU] | Freq: Two times a day (BID) | SUBCUTANEOUS | Status: DC
Start: 2016-08-24 — End: 2016-08-26
  Administered 2016-08-24 – 2016-08-26 (×4): 20 [IU] via SUBCUTANEOUS
  Filled 2016-08-24 (×5): qty 0.2

## 2016-08-24 MED ORDER — POLYVINYL ALCOHOL 1.4 % OP SOLN
1.0000 [drp] | Freq: Two times a day (BID) | OPHTHALMIC | Status: DC
  Administered 2016-08-24 – 2016-08-27 (×6): 1 [drp] via OPHTHALMIC
  Filled 2016-08-24: qty 15

## 2016-08-24 MED ORDER — CARBIDOPA-LEVODOPA 25-100 MG PO TABS
1.0000 | ORAL_TABLET | Freq: Two times a day (BID) | ORAL | Status: DC
  Administered 2016-08-24 – 2016-08-27 (×6): 1 via ORAL
  Filled 2016-08-24 (×6): qty 1

## 2016-08-24 MED ORDER — METOPROLOL TARTRATE 25 MG PO TABS
25.0000 mg | ORAL_TABLET | Freq: Two times a day (BID) | ORAL | Status: DC
Start: 2016-08-24 — End: 2016-08-27
  Administered 2016-08-24 – 2016-08-27 (×6): 25 mg via ORAL
  Filled 2016-08-24 (×6): qty 1

## 2016-08-24 MED ORDER — POLYETHYLENE GLYCOL 3350 17 G PO PACK
17.0000 g | PACK | Freq: Two times a day (BID) | ORAL | Status: DC
  Administered 2016-08-24 – 2016-08-26 (×5): 17 g via ORAL
  Filled 2016-08-24 (×5): qty 1

## 2016-08-24 NOTE — ED Notes (Signed)
Bed: WA10 Expected date:  Expected time:  Means of arrival:  Comments: EMS 

## 2016-08-24 NOTE — ED Notes (Signed)
Unable to collect labs at this time MD is in the room with patient 

## 2016-08-24 NOTE — ED Notes (Signed)
Patient appears flush. Patient only shaking her head at a couple questions asked. Shook her head no for pain.

## 2016-08-24 NOTE — ED Notes (Signed)
In and Out Cath performed by Elease Etienneandi RN [facility] and this RN.

## 2016-08-24 NOTE — ED Notes (Signed)
Urine collection delayed. Pt unable to always tell you when she has to urinate.  RN notifed.

## 2016-08-24 NOTE — ED Provider Notes (Addendum)
[facility]-EMERGENCY DEPT Provider Note   CSN: [id number] Arrival date & time: 08/24/16  1112     History   Chief Complaint No chief complaint on file.   HPI Maureen Taylor is a 81 y.o. female.  HPI  Pt comes in with cc of falls and AMS. Pt's daughter is present at the bedside. Per daughter, pt has parkinsons, but has never had as many falls as she has had in the last week. LAst week alone pt has had 4-5 falls. PT was just discharged from the ER at 3 am, and the RN at the nursing home noted that pt was confused and acting differently - so she was sent back to the ER. Pt is oriented x 3. She denies pain anywhere.   Past Medical History:  Diagnosis Date  . Allergic rhinitis   . Ascorbic acid deficiency   . Bipolar affective disorder (HCC)   . Candidiasis   . Charcot's joint   . Chronic constipation   . Dysuria   . GERD (gastroesophageal reflux disease)   . History of falling   . HLD (hyperlipidemia)   . HTN (hypertension)   . Magnesium deficiency   . Major depressive disorder   . Mixed incontinence   . Muscle weakness   . Neuropathic pain   . Neuropathy (HCC)   . OAB (overactive bladder)   . Obesity   . Oral pain   . Parkinson disease (HCC)   . Pneumonia   . Protein calorie malnutrition (HCC)   . Rosacea   . Slow transit constipation   . Type 2 diabetes mellitus with diabetic polyneuropathy, with long-term current use of insulin (HCC)   . Urinary tract infection without hematuria   . Vaginal atrophy   . Vitamin deficiency     Patient Active Problem List   Diagnosis Date Noted  . UTI (urinary tract infection) 08/24/2016  . Hyperlipidemia 06/05/2015  . Type 2 diabetes mellitus with diabetic polyneuropathy (HCC) 06/05/2015  . Vitamin D deficiency 06/05/2015  . Recurrent UTI 04/17/2015  . Vaginal atrophy 04/17/2015  . Incontinence 04/17/2015  . OAB (overactive bladder) 03/03/2015  . Bipolar depression (HCC) 01/05/2015  . GERD (gastroesophageal reflux disease)  01/05/2015  . Allergic rhinitis 01/05/2015  . Essential hypertension 01/05/2015  . Oral thrush 01/05/2015  . Parkinson's disease (HCC) 01/05/2015  . Neuropathy (HCC) 01/05/2015  . Diabetes mellitus with neuropathy (HCC) 01/05/2015    Past Surgical History:  Procedure Laterality Date  . ABDOMINAL HYSTERECTOMY    . CERVICAL CONIZATION W/BX    . CHOLECYSTECTOMY    . REVISION TOTAL KNEE ARTHROPLASTY Right     OB History    No data available       Home Medications    Prior to Admission medications   Medication Sig Start Date End Date Taking? Authorizing Provider  acetaminophen (TYLENOL) 325 MG tablet Take 650 mg by mouth every 6 (six) hours as needed for mild pain.    Yes Historical Provider, MD  ARIPiprazole (ABILIFY) 2 MG tablet Take 2 mg by mouth daily.   Yes Historical Provider, MD  aspirin 81 MG EC tablet Take 81 mg by mouth daily. Swallow whole.   Yes Historical Provider, MD  atorvastatin (LIPITOR) 10 MG tablet Take 10 mg by mouth daily.   Yes Historical Provider, MD  benazepril (LOTENSIN) 20 MG tablet Take 20 mg by mouth daily.   Yes Historical Provider, MD  bisacodyl (DULCOLAX) 10 MG suppository Place 10 mg rectally daily as needed  for moderate constipation.   Yes Historical Provider, MD  carbidopa-levodopa (PARCOPA) 25-100 MG disintegrating tablet Take 1 tablet by mouth 2 (two) times daily.    Yes Historical Provider, MD  cephALEXin (KEFLEX) 500 MG capsule Take 1 capsule (500 mg total) by mouth 2 (two) times daily. 08/24/16  Yes Tomasita CrumbleAdeleke Oni, MD  ciprofloxacin (CIPRO) 500 MG tablet Take 500 mg by mouth 2 (two) times daily. X 7 days   Yes Historical Provider, MD  conjugated estrogens (PREMARIN) vaginal cream Place 1 Applicatorful vaginally daily. Apply 0.5mg  (pea-sized amount)  just inside the vaginal introitus with a finger-tip every night for two weeks and then Monday, Wednesday and Friday nights. 02/25/16  Yes Shannon A McGowan, PA-C  Cranberry 200 MG CAPS Take 400 mg by mouth  daily. Take two 200-mg capsules = 400 mg   Yes Historical Provider, MD  DULoxetine (CYMBALTA) 60 MG capsule Take 60 mg by mouth daily.   Yes Historical Provider, MD  gabapentin (NEURONTIN) 100 MG capsule Take 200 mg by mouth 2 (two) times daily. Take two 100 mg capsules to = 200 mg PO BID   Yes Historical Provider, MD  glimepiride (AMARYL) 4 MG tablet Take 4 mg by mouth daily with breakfast.   Yes Historical Provider, MD  insulin aspart (NOVOLOG) 100 UNIT/ML injection Inject 8-12 Units into the skin 2 (two) times daily as needed for high blood sugar. IF CBG >200, GIVE NOVOLOG 8 UNITS SQ TWO TIMES DAILY AS NEEDED IF CBG >400, GIVE NOVOLOG 12 UNITS SQ TWO TIMES DAILY AS NEEDED   Yes Historical Provider, MD  insulin glargine (LANTUS) 100 UNIT/ML injection Inject 38 Units into the skin 2 (two) times daily. QAM and QHS   Yes Historical Provider, MD  lamoTRIgine (LAMICTAL) 100 MG tablet Take 100 mg by mouth 2 (two) times daily.    Yes Historical Provider, MD  loperamide (IMODIUM) 2 MG capsule Take 2 tablets = 4mg  by mouth initially then 1 tablet =2mg  after each episode of diarrhea. DO NOT EXCEED 16MG  IN 24 HOUR PERIOD IF DIARRHEA PERSISTS MORE.   Yes Historical Provider, MD  loratadine (CLARITIN) 10 MG tablet Take 10 mg by mouth daily.    Yes Historical Provider, MD  magnesium oxide (MAG-OX) 400 MG tablet Take 400 mg by mouth daily.   Yes Historical Provider, MD  metoprolol tartrate (LOPRESSOR) 25 MG tablet Take 25 mg by mouth 2 (two) times daily. Take 25 mg by mouth twice a day for HTN   Yes Historical Provider, MD  METRONIDAZOLE, TOPICAL, (METROLOTION) 0.75 % LOTN Apply 1 application topically 2 (two) times daily. Starting 08/12/16. To face, for rosacea.   Yes Historical Provider, MD  miconazole (MONISTAT 7) 2 % vaginal cream Place 1 Applicatorful vaginally 2 (two) times daily.   Yes Historical Provider, MD  multivitamin-lutein (OCUVITE-LUTEIN) CAPS capsule Take 1 capsule by mouth daily.   Yes Historical  Provider, MD  oxybutynin (DITROPAN) 5 MG tablet Take 5 mg by mouth 3 (three) times daily.   Yes Historical Provider, MD  pantoprazole (PROTONIX) 20 MG tablet Take 20 mg by mouth at bedtime.   Yes Historical Provider, MD  pantoprazole (PROTONIX) 40 MG tablet Take 40 mg by mouth daily.    Yes Historical Provider, MD  polyethylene glycol (MIRALAX / GLYCOLAX) packet Take 17 g by mouth 2 (two) times daily.   Yes Historical Provider, MD  Propylene Glycol (SYSTANE BALANCE OP) Apply 1 drop to eye 2 (two) times daily. Both eyes   Yes  Historical Provider, MD  Protein (PROCEL PO) Take 1 scoop by mouth 2 (two) times daily.    Yes Historical Provider, MD  sennosides-docusate sodium (SENOKOT-S) 8.6-50 MG tablet Take 2 tablets by mouth 2 (two) times daily.   Yes Historical Provider, MD    Family History Family History  Problem Relation Age of Onset  . Colon cancer Father   . Colon cancer Sister   . Stomach cancer Sister   . Rectal cancer Sister   . Kidney disease Neg Hx   . Bladder Cancer Neg Hx   . Prostate cancer Neg Hx     Social History Social History  Substance Use Topics  . Smoking status: Never Smoker  . Smokeless tobacco: Never Used  . Alcohol use No     Allergies   Codeine; Morphine and related; and Indocin [indomethacin]   Review of Systems Review of Systems  ROS 10 Systems reviewed and are negative for acute change except as noted in the HPI.     Physical Exam Updated Vital Signs BP (!) 146/64 (BP Location: Left Arm)   Pulse 92   Temp 99 F (37.2 C) (Oral)   Resp 20   Ht 5\' 5"  (1.651 m)   Wt 247 lb 14.4 oz (112.4 kg)   SpO2 96%   BMI 41.25 kg/m   Physical Exam  Constitutional: She is oriented to person, place, and time. She appears well-developed.  HENT:  Facial bruise  Eyes: EOM are normal. Pupils are equal, round, and reactive to light.  Neck: Normal range of motion. Neck supple.  Cardiovascular: Normal rate.   Pulmonary/Chest: Effort normal.  Abdominal:  Soft. Bowel sounds are normal. There is no tenderness.  Neurological: She is alert and oriented to person, place, and time.  Skin: Skin is warm and dry.  Nursing note and vitals reviewed.    ED Treatments / Results  Labs (all labs ordered are listed, but only abnormal results are displayed) Labs Reviewed  COMPREHENSIVE METABOLIC PANEL - Abnormal; Notable for the following:       Result Value   Glucose, Bld 205 (*)    Calcium 8.4 (*)    Albumin 3.1 (*)    ALT 9 (*)    All other components within normal limits  CBC WITH DIFFERENTIAL/PLATELET - Abnormal; Notable for the following:    WBC 11.0 (*)    Neutro Abs 8.0 (*)    Monocytes Absolute 1.3 (*)    All other components within normal limits  URINALYSIS, ROUTINE W REFLEX MICROSCOPIC (NOT AT [facility]) - Abnormal; Notable for the following:    APPearance CLOUDY (*)    Glucose, UA 100 (*)    Hgb urine dipstick MODERATE (*)    Protein, ur 100 (*)    Leukocytes, UA MODERATE (*)    All other components within normal limits  URINE MICROSCOPIC-ADD ON - Abnormal; Notable for the following:    Squamous Epithelial / LPF 0-5 (*)    Bacteria, UA MANY (*)    All other components within normal limits  GLUCOSE, CAPILLARY - Abnormal; Notable for the following:    Glucose-Capillary 131 (*)    All other components within normal limits  CULTURE, BLOOD (ROUTINE X 2)  CULTURE, BLOOD (ROUTINE X 2)  URINE CULTURE  TROPONIN I  PROTIME-INR  CBC  CREATININE, SERUM  BASIC METABOLIC PANEL  CBC  I-STAT CG4 LACTIC ACID, ED    EKG  EKG Interpretation  Date/Time:  Wednesday August 24 2016 11:29:04 EST Ventricular  Rate:  101 PR Interval:    QRS Duration: 81 QT Interval:  349 QTC Calculation: 453 R Axis:   45 Text Interpretation:  Sinus tachycardia No acute changes No significant change since last tracing Confirmed by Rhunette Croft, MD, Janey Genta [phone]) on 08/24/2016 1:08:18 PM       Radiology Dg Chest 2 View  Result Date: 08/24/2016 CLINICAL  DATA:  Status post fall, with decreased O2 saturation. Initial encounter. EXAM: CHEST  2 VIEW COMPARISON:  Chest radiograph from 05/30/2014 FINDINGS: The lungs are hypoexpanded. Vascular congestion is noted. There is no evidence of focal opacification, pleural effusion or pneumothorax. The heart is mildly enlarged. No acute osseous abnormalities are seen. IMPRESSION: Lungs hypoexpanded. Vascular congestion and mild cardiomegaly. No displaced rib fractures identified. Electronically Signed   By: Roanna Raider M.D.   On: 08/24/2016 01:03   Ct Head Wo Contrast  Result Date: 08/24/2016 CLINICAL DATA:  Status post fall with right-sided headache EXAM: CT HEAD WITHOUT CONTRAST CT MAXILLOFACIAL WITHOUT CONTRAST CT CERVICAL SPINE WITHOUT CONTRAST TECHNIQUE: Multidetector CT imaging of the head, cervical spine, and maxillofacial structures were performed using the standard protocol without intravenous contrast. Multiplanar CT image reconstructions of the cervical spine and maxillofacial structures were also generated. COMPARISON:  CT head and cervical spine 08/19/2016 FINDINGS: CT HEAD FINDINGS Brain: No mass lesion, intraparenchymal hemorrhage or extra-axial collection. No evidence of acute cortical infarct. There is periventricular hypoattenuation compatible with chronic microvascular disease. Old right centrum semiovale lacunar infarct. Vascular: Mild carotid and vertebral artery calcification at the skullbase. Skull: No skull fracture identified. Medium-sized right frontal scalp hematoma/laceration. CT MAXILLOFACIAL FINDINGS Osseous: --Complex facial fracture types: No LeFort, zygomaticomaxillary complex or nasoorbitoethmoidal fracture. --Simple fracture types: None. --Mandible: No fracture or dislocation. Orbits: The globes appear intact. Normal appearance of the intra- and extraconal fat. Symmetric extraocular muscles. Sinuses: No fluid levels or advanced mucosal thickening. Soft tissues: Normal visualized  extracranial facial soft tissues. CT CERVICAL SPINE FINDINGS Alignment: No acute static subluxation. Facets are aligned. Occipital condyles are normally positioned. Unchanged grade 1 C4-5 anterolisthesis. Skull base and vertebrae: No acute fracture. Soft tissues and spinal canal: No prevertebral fluid or swelling. No visible canal hematoma. Disc levels: Multilevel facet hypertrophy and fusion at the C5-T1 levels. No osseous spinal canal stenosis. Upper chest: No pneumothorax, pulmonary nodule or pleural effusion. Other: There is a left level 3 lymph node measuring 1.1 cm, new from the prior study (series 8, image 42). Enlarged lymph nodes are also noted right level 2. IMPRESSION: 1. No acute intracranial abnormality. 2. Medium-sized right frontal scalp laceration/hematoma. No underlying skull fracture or facial fracture. 3. No acute fracture or static subluxation of the cervical spine. 4. New cervical lymphadenopathy compared to 08/19/2016, of uncertain significance. Electronically Signed   By: Deatra Robinson M.D.   On: 08/24/2016 00:57   Ct Cervical Spine Wo Contrast  Result Date: 08/24/2016 CLINICAL DATA:  Status post fall with right-sided headache EXAM: CT HEAD WITHOUT CONTRAST CT MAXILLOFACIAL WITHOUT CONTRAST CT CERVICAL SPINE WITHOUT CONTRAST TECHNIQUE: Multidetector CT imaging of the head, cervical spine, and maxillofacial structures were performed using the standard protocol without intravenous contrast. Multiplanar CT image reconstructions of the cervical spine and maxillofacial structures were also generated. COMPARISON:  CT head and cervical spine 08/19/2016 FINDINGS: CT HEAD FINDINGS Brain: No mass lesion, intraparenchymal hemorrhage or extra-axial collection. No evidence of acute cortical infarct. There is periventricular hypoattenuation compatible with chronic microvascular disease. Old right centrum semiovale lacunar infarct. Vascular: Mild carotid and vertebral artery  calcification at the  skullbase. Skull: No skull fracture identified. Medium-sized right frontal scalp hematoma/laceration. CT MAXILLOFACIAL FINDINGS Osseous: --Complex facial fracture types: No LeFort, zygomaticomaxillary complex or nasoorbitoethmoidal fracture. --Simple fracture types: None. --Mandible: No fracture or dislocation. Orbits: The globes appear intact. Normal appearance of the intra- and extraconal fat. Symmetric extraocular muscles. Sinuses: No fluid levels or advanced mucosal thickening. Soft tissues: Normal visualized extracranial facial soft tissues. CT CERVICAL SPINE FINDINGS Alignment: No acute static subluxation. Facets are aligned. Occipital condyles are normally positioned. Unchanged grade 1 C4-5 anterolisthesis. Skull base and vertebrae: No acute fracture. Soft tissues and spinal canal: No prevertebral fluid or swelling. No visible canal hematoma. Disc levels: Multilevel facet hypertrophy and fusion at the C5-T1 levels. No osseous spinal canal stenosis. Upper chest: No pneumothorax, pulmonary nodule or pleural effusion. Other: There is a left level 3 lymph node measuring 1.1 cm, new from the prior study (series 8, image 42). Enlarged lymph nodes are also noted right level 2. IMPRESSION: 1. No acute intracranial abnormality. 2. Medium-sized right frontal scalp laceration/hematoma. No underlying skull fracture or facial fracture. 3. No acute fracture or static subluxation of the cervical spine. 4. New cervical lymphadenopathy compared to 08/19/2016, of uncertain significance. Electronically Signed   By: Deatra Robinson M.D.   On: 08/24/2016 00:57   Ct Hip Right Wo Contrast  Result Date: 08/24/2016 CLINICAL DATA:  Status post fall, with right hip pain. Initial encounter. EXAM: CT OF THE RIGHT HIP WITHOUT CONTRAST TECHNIQUE: Multidetector CT imaging of the right hip was performed according to the standard protocol. Multiplanar CT image reconstructions were also generated. COMPARISON:  Pelvis radiograph performed  08/22/2016 FINDINGS: Bones/Joint/Cartilage There is no evidence of fracture or dislocation. The patient's right hip hemiarthroplasty is grossly unremarkable in appearance, without evidence of loosening. Mild underlying heterotopic bone formation is noted at the right acetabulum. Mild degenerative change is noted at the pubic symphysis. Ligaments Suboptimally assessed by CT. Muscles and Tendons The visualized musculature is grossly unremarkable. No tendon abnormalities are characterized. Soft tissues Visualized bowel loops are grossly unremarkable in appearance. The bladder is mildly distended and unremarkable in appearance. Scattered vascular calcifications are seen. IMPRESSION: 1. No evidence of fracture or dislocation. 2. Right hip arthroplasty is unremarkable in appearance, without evidence of loosening. 3. Scattered vascular calcifications seen. Electronically Signed   By: Roanna Raider M.D.   On: 08/24/2016 01:00   Dg Chest Port 1 View  Result Date: 08/24/2016 CLINICAL DATA:  Fever with multiple falls last week. EXAM: PORTABLE CHEST 1 VIEW COMPARISON:  08/24/2016 FINDINGS: Cardiomegaly. Low lung volumes with bibasilar atelectasis. No visible effusions or acute bony abnormality. IMPRESSION: Cardiomegaly.  Bibasilar atelectasis. Electronically Signed   By: Charlett Nose M.D.   On: 08/24/2016 12:54   Ct Maxillofacial Wo Contrast  Result Date: 08/24/2016 CLINICAL DATA:  Status post fall with right-sided headache EXAM: CT HEAD WITHOUT CONTRAST CT MAXILLOFACIAL WITHOUT CONTRAST CT CERVICAL SPINE WITHOUT CONTRAST TECHNIQUE: Multidetector CT imaging of the head, cervical spine, and maxillofacial structures were performed using the standard protocol without intravenous contrast. Multiplanar CT image reconstructions of the cervical spine and maxillofacial structures were also generated. COMPARISON:  CT head and cervical spine 08/19/2016 FINDINGS: CT HEAD FINDINGS Brain: No mass lesion, intraparenchymal  hemorrhage or extra-axial collection. No evidence of acute cortical infarct. There is periventricular hypoattenuation compatible with chronic microvascular disease. Old right centrum semiovale lacunar infarct. Vascular: Mild carotid and vertebral artery calcification at the skullbase. Skull: No skull fracture identified. Medium-sized right frontal  scalp hematoma/laceration. CT MAXILLOFACIAL FINDINGS Osseous: --Complex facial fracture types: No LeFort, zygomaticomaxillary complex or nasoorbitoethmoidal fracture. --Simple fracture types: None. --Mandible: No fracture or dislocation. Orbits: The globes appear intact. Normal appearance of the intra- and extraconal fat. Symmetric extraocular muscles. Sinuses: No fluid levels or advanced mucosal thickening. Soft tissues: Normal visualized extracranial facial soft tissues. CT CERVICAL SPINE FINDINGS Alignment: No acute static subluxation. Facets are aligned. Occipital condyles are normally positioned. Unchanged grade 1 C4-5 anterolisthesis. Skull base and vertebrae: No acute fracture. Soft tissues and spinal canal: No prevertebral fluid or swelling. No visible canal hematoma. Disc levels: Multilevel facet hypertrophy and fusion at the C5-T1 levels. No osseous spinal canal stenosis. Upper chest: No pneumothorax, pulmonary nodule or pleural effusion. Other: There is a left level 3 lymph node measuring 1.1 cm, new from the prior study (series 8, image 42). Enlarged lymph nodes are also noted right level 2. IMPRESSION: 1. No acute intracranial abnormality. 2. Medium-sized right frontal scalp laceration/hematoma. No underlying skull fracture or facial fracture. 3. No acute fracture or static subluxation of the cervical spine. 4. New cervical lymphadenopathy compared to 08/19/2016, of uncertain significance. Electronically Signed   By: Deatra RobinsonKevin  Herman M.D.   On: 08/24/2016 00:57    Procedures Procedures (including critical care time)  Medications Ordered in ED Medications    nystatin (MYCOSTATIN/NYSTOP) topical powder ( Topical Given 08/24/16 1904)  cefTRIAXone (ROCEPHIN) 1 g in dextrose 5 % 50 mL IVPB (not administered)  pantoprazole (PROTONIX) EC tablet 20 mg (not administered)  benazepril (LOTENSIN) tablet 20 mg (not administered)  carbidopa-levodopa (SINEMET IR) 25-100 MG per tablet immediate release 1 tablet (not administered)  polyethylene glycol (MIRALAX / GLYCOLAX) packet 17 g (not administered)  senna-docusate (Senokot-S) tablet 2 tablet (not administered)  Propylene Glycol 0.6 % SOLN (not administered)  atorvastatin (LIPITOR) tablet 10 mg (not administered)  DULoxetine (CYMBALTA) DR capsule 60 mg (not administered)  gabapentin (NEURONTIN) capsule 200 mg (not administered)  insulin glargine (LANTUS) injection 20 Units (not administered)  lamoTRIgine (LAMICTAL) tablet 100 mg (not administered)  aspirin EC tablet 81 mg (not administered)  metoprolol tartrate (LOPRESSOR) tablet 25 mg (not administered)  ARIPiprazole (ABILIFY) tablet 2 mg (not administered)  enoxaparin (LOVENOX) injection 40 mg (not administered)  insulin aspart (novoLOG) injection 0-15 Units (not administered)  insulin aspart (novoLOG) injection 0-5 Units (not administered)  acetaminophen (TYLENOL) tablet 650 mg (not administered)    Or  acetaminophen (TYLENOL) suppository 650 mg (not administered)  cefTRIAXone (ROCEPHIN) 1 g in dextrose 5 % 50 mL IVPB (0 g Intravenous Stopped 08/24/16 1450)     Initial Impression / Assessment and Plan / ED Course  I have reviewed the triage vital signs and the nursing notes.  Pertinent labs & imaging results that were available during my care of the patient were reviewed by me and considered in my medical decision making (see chart for details).  Clinical Course     Pt comes in with falls, AMS. She was discharged at 3 am. Her Urine had signs of infection.  AMS - could be post concussion or UTI. Ataxia - could be infection related, worsening  parkinsons. Will benefit from PT. She clearly has had 4-5 falls I nthe last week, and it wont be safe to send her w/o further PT eval given the clinical picture.  Final Clinical Impressions(s) / ED Diagnoses   Final diagnoses:  Acute cystitis without hematuria  Candida infection    New Prescriptions Current Discharge Medication List  Derwood Kaplan, MD 08/24/16 1612    Derwood Kaplan, MD 08/24/16 2024

## 2016-08-24 NOTE — ED Triage Notes (Signed)
[facility] called EMS for AMS. [facility] and recently has fallen over 6 times in the past few weeks. Patient fell and hit her head and was sent to the hospital. She was discharged and sent home early this morning. When she arrived to the facility the nurse recognized that she was not her normal self. Patient normally talks and walks and has no problem communicating. She now will only shake her head in response to yes and no questions and is very weak. She also has a temp 101.8. O2 sats were 90% on room air.,

## 2016-08-24 NOTE — ED Notes (Signed)
Daughter is Maureen Taylor [phone]

## 2016-08-24 NOTE — Clinical Social Work Note (Signed)
Clinical Social Work Assessment  Patient Details  Name: Maureen Taylor MRN: [id number] Date of Birth: [birth year]  Date of referral:  08/24/16               Reason for consult:  Other (Comment Required) ([facility]                Permission sought to share information with:    Permission granted to share information::     Name::        Agency::     Relationship::     Contact Information:     Housing/Transportation Living arrangements for the past 2 months:  Skilled Nursing Facility Source of Information:  Adult Children Wallene Huh([name] Alexander, daughter) Patient Interpreter Needed:  None Criminal Activity/Legal Involvement Pertinent to Current Situation/Hospitalization:  No - Comment as needed Significant Relationships:  Adult Children Lives with:  Facility Resident Do you feel safe going back to the place where you live?    Need for family participation in patient care:  Yes (Comment)  Care giving concerns: Unknown at this time   Office managerocial Worker assessment / plan: CSW obtained information from patient's daughter, Wallene HuhJoni Alexander, as patient is not communicating verbally at this time. Per Nurses note, "patient normally talks and walks and has no problem communicating". Patient will "only shake her head in response to yes and no questions".   Patient's daughter stated patient has had five falls in the last seven days and this is her fourth hospital visit since Friday. Daughter stated patient "normally communicates but was not responding to verbal commands". Daughter stated patient was with her for Thanksgiving and she was talking fine. Daughter stated she suspects patient may have an infection. Daughter stated patient has another daughter, however, she is the main support and POA.   Patient's daughter stated [facility] and she uses a walker. CSW informed daughter that patient was seen by social work due to consult for fall and being from a facility. No  other questions noted for CSW at this time.   Employment status:    Insurance information:  Medicare PT Recommendations:  Not assessed at this time Information / Referral to community resources:     Patient/Family's Response to care: Unknown at this time  Patient/Family's Understanding of and Emotional Response to Diagnosis, Current Treatment, and Prognosis: Unknown at this time  Emotional Assessment Appearance:    Attitude/Demeanor/Rapport:    Affect (typically observed):    Orientation:    Alcohol / Substance use:  Not Applicable Psych involvement (Current and /or in the community):  No (Comment)  Discharge Needs  Concerns to be addressed:    Readmission within the last 30 days:    Current discharge risk:    Barriers to Discharge:      Claudean SeveranceLaVonia M Wilburn Keir, LCSW 08/24/2016, 2:52 PM

## 2016-08-24 NOTE — H&P (Addendum)
History and Physical    Maureen Taylor:[id number] DOB: 1934-10-15 DOA: 08/24/2016  PCP: Oneal Grout, MD Patient coming from[facility]  Chief Complaint: Altered mental status  HPI: Maureen Taylor is a 81 y.o. female with medical history significant of Parkinson disease, diabetes mellitus, hypertension, recurrent UTI, bipolar disorder. Patient presented to the ED secondary to altered mental status as reported by her skilled nursing facility and her daughter. She was recently evaluated in the ED for a fall. She has had multiple falls in the past week. Nothing has been given to help with her mental status. Her daughter states that she is not as talkative or as mobile as she usually is.  ED Course: Vitals: Temperature of 100.3 Labs: WBC of 11k. Urinalysis significant for bacteria, hemoglobin with RBCs and moderate leukocyte esterase Imaging: CXR significant for cardiomegaly and atelectasis Medications/Course: Given ceftriaxone  Review of Systems: Review of Systems  Constitutional: Negative for chills and fever.  Respiratory: Negative for cough, sputum production, shortness of breath and wheezing.   Cardiovascular: Negative for chest pain.  Gastrointestinal: Negative for constipation, diarrhea, nausea and vomiting.  Genitourinary: Negative for dysuria, frequency, hematuria and urgency.  Neurological: Positive for weakness. Negative for tremors, speech change, focal weakness and headaches.  All other systems reviewed and are negative.   Past Medical History:  Diagnosis Date  . Allergic rhinitis   . Ascorbic acid deficiency   . Bipolar affective disorder (HCC)   . Candidiasis   . Charcot's joint   . Chronic constipation   . Dysuria   . GERD (gastroesophageal reflux disease)   . History of falling   . HLD (hyperlipidemia)   . HTN (hypertension)   . Magnesium deficiency   . Major depressive disorder   . Mixed incontinence   . Muscle weakness   . Neuropathic pain    . Neuropathy (HCC)   . OAB (overactive bladder)   . Obesity   . Oral pain   . Parkinson disease (HCC)   . Pneumonia   . Protein calorie malnutrition (HCC)   . Rosacea   . Slow transit constipation   . Type 2 diabetes mellitus with diabetic polyneuropathy, with long-term current use of insulin (HCC)   . Urinary tract infection without hematuria   . Vaginal atrophy   . Vitamin deficiency     Past Surgical History:  Procedure Laterality Date  . ABDOMINAL HYSTERECTOMY    . CERVICAL CONIZATION W/BX    . CHOLECYSTECTOMY    . REVISION TOTAL KNEE ARTHROPLASTY Right      reports that she has never smoked. She has never used smokeless tobacco. She reports that she does not drink alcohol or use drugs.  Allergies  Allergen Reactions  . Codeine Itching  . Morphine And Related Itching  . Indocin [Indomethacin] Rash    Family History  Problem Relation Age of Onset  . Colon cancer Father   . Colon cancer Sister   . Stomach cancer Sister   . Rectal cancer Sister   . Kidney disease Neg Hx   . Bladder Cancer Neg Hx   . Prostate cancer Neg Hx     Prior to Admission medications   Medication Sig Start Date End Date Taking? Authorizing Provider  acetaminophen (TYLENOL) 325 MG tablet Take 650 mg by mouth every 6 (six) hours as needed for mild pain.    Yes Historical Provider, MD  ARIPiprazole (ABILIFY) 2 MG tablet Take 2 mg by mouth daily.   Yes  Historical Provider, MD  aspirin 81 MG EC tablet Take 81 mg by mouth daily. Swallow whole.   Yes Historical Provider, MD  atorvastatin (LIPITOR) 10 MG tablet Take 10 mg by mouth daily.   Yes Historical Provider, MD  benazepril (LOTENSIN) 20 MG tablet Take 20 mg by mouth daily.   Yes Historical Provider, MD  bisacodyl (DULCOLAX) 10 MG suppository Place 10 mg rectally daily as needed for moderate constipation.   Yes Historical Provider, MD  carbidopa-levodopa (PARCOPA) 25-100 MG disintegrating tablet Take 1 tablet by mouth 2 (two) times daily.     Yes Historical Provider, MD  cephALEXin (KEFLEX) 500 MG capsule Take 1 capsule (500 mg total) by mouth 2 (two) times daily. 08/24/16  Yes Tomasita Crumble, MD  ciprofloxacin (CIPRO) 500 MG tablet Take 500 mg by mouth 2 (two) times daily. X 7 days   Yes Historical Provider, MD  conjugated estrogens (PREMARIN) vaginal cream Place 1 Applicatorful vaginally daily. Apply 0.5mg  (pea-sized amount)  just inside the vaginal introitus with a finger-tip every night for two weeks and then Monday, Wednesday and Friday nights. 02/25/16  Yes Shannon A McGowan, PA-C  Cranberry 200 MG CAPS Take 400 mg by mouth daily. Take two 200-mg capsules = 400 mg   Yes Historical Provider, MD  DULoxetine (CYMBALTA) 60 MG capsule Take 60 mg by mouth daily.   Yes Historical Provider, MD  gabapentin (NEURONTIN) 100 MG capsule Take 200 mg by mouth 2 (two) times daily. Take two 100 mg capsules to = 200 mg PO BID   Yes Historical Provider, MD  glimepiride (AMARYL) 4 MG tablet Take 4 mg by mouth daily with breakfast.   Yes Historical Provider, MD  insulin aspart (NOVOLOG) 100 UNIT/ML injection Inject 8-12 Units into the skin 2 (two) times daily as needed for high blood sugar. IF CBG >200, GIVE NOVOLOG 8 UNITS SQ TWO TIMES DAILY AS NEEDED IF CBG >400, GIVE NOVOLOG 12 UNITS SQ TWO TIMES DAILY AS NEEDED   Yes Historical Provider, MD  insulin glargine (LANTUS) 100 UNIT/ML injection Inject 38 Units into the skin 2 (two) times daily. QAM and QHS   Yes Historical Provider, MD  lamoTRIgine (LAMICTAL) 100 MG tablet Take 100 mg by mouth 2 (two) times daily.    Yes Historical Provider, MD  loperamide (IMODIUM) 2 MG capsule Take 2 tablets = 4mg  by mouth initially then 1 tablet =2mg  after each episode of diarrhea. DO NOT EXCEED 16MG  IN 24 HOUR PERIOD IF DIARRHEA PERSISTS MORE.   Yes Historical Provider, MD  loratadine (CLARITIN) 10 MG tablet Take 10 mg by mouth daily.    Yes Historical Provider, MD  magnesium oxide (MAG-OX) 400 MG tablet Take 400 mg by mouth  daily.   Yes Historical Provider, MD  metoprolol tartrate (LOPRESSOR) 25 MG tablet Take 25 mg by mouth 2 (two) times daily. Take 25 mg by mouth twice a day for HTN   Yes Historical Provider, MD  METRONIDAZOLE, TOPICAL, (METROLOTION) 0.75 % LOTN Apply 1 application topically 2 (two) times daily. Starting 08/12/16. To face, for rosacea.   Yes Historical Provider, MD  miconazole (MONISTAT 7) 2 % vaginal cream Place 1 Applicatorful vaginally 2 (two) times daily.   Yes Historical Provider, MD  multivitamin-lutein (OCUVITE-LUTEIN) CAPS capsule Take 1 capsule by mouth daily.   Yes Historical Provider, MD  oxybutynin (DITROPAN) 5 MG tablet Take 5 mg by mouth 3 (three) times daily.   Yes Historical Provider, MD  pantoprazole (PROTONIX) 20 MG tablet Take 20  mg by mouth at bedtime.   Yes Historical Provider, MD  pantoprazole (PROTONIX) 40 MG tablet Take 40 mg by mouth daily.    Yes Historical Provider, MD  polyethylene glycol (MIRALAX / GLYCOLAX) packet Take 17 g by mouth 2 (two) times daily.   Yes Historical Provider, MD  Propylene Glycol (SYSTANE BALANCE OP) Apply 1 drop to eye 2 (two) times daily. Both eyes   Yes Historical Provider, MD  Protein (PROCEL PO) Take 1 scoop by mouth 2 (two) times daily.    Yes Historical Provider, MD  sennosides-docusate sodium (SENOKOT-S) 8.6-50 MG tablet Take 2 tablets by mouth 2 (two) times daily.   Yes Historical Provider, MD    Physical Exam: Vitals:   08/24/16 1700 08/24/16 1730 08/24/16 1800 08/24/16 1830  BP: 155/71 146/68 138/63 142/56  Pulse:   93   Resp: 19 20 19 13   Temp:      TempSrc:      SpO2:   95%   Weight:      Height:         Constitutional: NAD, calm, comfortable Eyes: PERRL, there is some periorbital swelling worse on right than left with some bruising. ENMT: Mucous membranes are moist. Posterior pharynx clear of any exudate or lesions.Normal dentition.  Neck: normal, supple, no masses, no thyromegaly Respiratory: clear to auscultation  bilaterally, no wheezing, no crackles. Normal respiratory effort. No accessory muscle use.  Cardiovascular: Regular rate and rhythm, no murmurs / rubs / gallops. No extremity edema. 2+ pedal pulses. No carotid bruits.  Abdomen: no tenderness, no masses palpated. Bowel sounds positive.  Musculoskeletal: no clubbing / cyanosis. No joint deformity upper and lower extremities. Good ROM, no contractures. Normal muscle tone. Right LE has some mild erythema with swelling. Skin: no rashes, lesions, ulcers. No induration Neurologic: CN 2-12 grossly intact. Sensation intact, DTR normal. Strength 4/5 in all 4 extremities Psychiatric: Normal judgment and insight. Slightly somnolent and oriented x 3. Normal mood.   Labs on Admission: I have personally reviewed following labs and imaging studies  CBC:  Recent Labs Lab 08/24/16 0102 08/24/16 1305  WBC 11.3* 11.0*  NEUTROABS 8.0* 8.0*  HGB 13.4 13.1  HCT 40.2 39.6  MCV 91.4 90.8  PLT 235 227   Basic Metabolic Panel:  Recent Labs Lab 08/24/16 0102 08/24/16 1305  NA 136 135  K 4.2 4.0  CL 101 102  CO2 26 24  GLUCOSE 289* 205*  BUN 17 14  CREATININE 1.01* 0.80  CALCIUM 9.2 8.4*   GFR: Estimated Creatinine Clearance: 70.8 mL/min (by C-G formula based on SCr of 0.8 mg/dL). Liver Function Tests:  Recent Labs Lab 08/24/16 1305  AST 22  ALT 9*  ALKPHOS 91  BILITOT 0.7  PROT 6.7  ALBUMIN 3.1*   No results for input(s): LIPASE, AMYLASE in the last 168 hours. No results for input(s): AMMONIA in the last 168 hours. Coagulation Profile:  Recent Labs Lab 08/24/16 1305  INR 1.10   Cardiac Enzymes:  Recent Labs Lab 08/24/16 1305  TROPONINI <0.03   BNP (last 3 results) No results for input(s): PROBNP in the last 8760 hours. HbA1C: No results for input(s): HGBA1C in the last 72 hours. CBG: No results for input(s): GLUCAP in the last 168 hours. Lipid Profile: No results for input(s): CHOL, HDL, LDLCALC, TRIG, CHOLHDL,  LDLDIRECT in the last 72 hours. Thyroid Function Tests: No results for input(s): TSH, T4TOTAL, FREET4, T3FREE, THYROIDAB in the last 72 hours. Anemia Panel: No results for input(s):  VITAMINB12, FOLATE, FERRITIN, TIBC, IRON, RETICCTPCT in the last 72 hours. Urine analysis:    Component Value Date/Time   COLORURINE YELLOW 08/24/2016 1405   APPEARANCEUR CLOUDY (A) 08/24/2016 1405   APPEARANCEUR Cloudy (A) 02/25/2016 1548   LABSPEC 1.019 08/24/2016 1405   LABSPEC 1.017 10/30/2014 0830   PHURINE 5.5 08/24/2016 1405   GLUCOSEU 100 (A) 08/24/2016 1405   GLUCOSEU Negative 10/30/2014 0830   HGBUR MODERATE (A) 08/24/2016 1405   BILIRUBINUR NEGATIVE 08/24/2016 1405   BILIRUBINUR Negative 02/25/2016 1548   BILIRUBINUR Negative 10/30/2014 0830   KETONESUR NEGATIVE 08/24/2016 1405   PROTEINUR 100 (A) 08/24/2016 1405   NITRITE NEGATIVE 08/24/2016 1405   LEUKOCYTESUR MODERATE (A) 08/24/2016 1405   LEUKOCYTESUR 3+ (A) 02/25/2016 1548   LEUKOCYTESUR 2+ 10/30/2014 0830   Sepsis Labs: !!!!!!!!!!!!!!!!!!!!!!!!!!!!!!!!!!!!!!!!!!!! @LABRCNTIP (procalcitonin:4,lacticidven:4) )No results found for this or any previous visit (from the past 240 hour(s)).   Radiological Exams on Admission: Dg Chest 2 View  Result Date: 08/24/2016 CLINICAL DATA:  Status post fall, with decreased O2 saturation. Initial encounter. EXAM: CHEST  2 VIEW COMPARISON:  Chest radiograph from 05/30/2014 FINDINGS: The lungs are hypoexpanded. Vascular congestion is noted. There is no evidence of focal opacification, pleural effusion or pneumothorax. The heart is mildly enlarged. No acute osseous abnormalities are seen. IMPRESSION: Lungs hypoexpanded. Vascular congestion and mild cardiomegaly. No displaced rib fractures identified. Electronically Signed   By: Roanna RaiderJeffery  Chang M.D.   On: 08/24/2016 01:03   Ct Head Wo Contrast  Result Date: 08/24/2016 CLINICAL DATA:  Status post fall with right-sided headache EXAM: CT HEAD WITHOUT CONTRAST  CT MAXILLOFACIAL WITHOUT CONTRAST CT CERVICAL SPINE WITHOUT CONTRAST TECHNIQUE: Multidetector CT imaging of the head, cervical spine, and maxillofacial structures were performed using the standard protocol without intravenous contrast. Multiplanar CT image reconstructions of the cervical spine and maxillofacial structures were also generated. COMPARISON:  CT head and cervical spine 08/19/2016 FINDINGS: CT HEAD FINDINGS Brain: No mass lesion, intraparenchymal hemorrhage or extra-axial collection. No evidence of acute cortical infarct. There is periventricular hypoattenuation compatible with chronic microvascular disease. Old right centrum semiovale lacunar infarct. Vascular: Mild carotid and vertebral artery calcification at the skullbase. Skull: No skull fracture identified. Medium-sized right frontal scalp hematoma/laceration. CT MAXILLOFACIAL FINDINGS Osseous: --Complex facial fracture types: No LeFort, zygomaticomaxillary complex or nasoorbitoethmoidal fracture. --Simple fracture types: None. --Mandible: No fracture or dislocation. Orbits: The globes appear intact. Normal appearance of the intra- and extraconal fat. Symmetric extraocular muscles. Sinuses: No fluid levels or advanced mucosal thickening. Soft tissues: Normal visualized extracranial facial soft tissues. CT CERVICAL SPINE FINDINGS Alignment: No acute static subluxation. Facets are aligned. Occipital condyles are normally positioned. Unchanged grade 1 C4-5 anterolisthesis. Skull base and vertebrae: No acute fracture. Soft tissues and spinal canal: No prevertebral fluid or swelling. No visible canal hematoma. Disc levels: Multilevel facet hypertrophy and fusion at the C5-T1 levels. No osseous spinal canal stenosis. Upper chest: No pneumothorax, pulmonary nodule or pleural effusion. Other: There is a left level 3 lymph node measuring 1.1 cm, new from the prior study (series 8, image 42). Enlarged lymph nodes are also noted right level 2. IMPRESSION: 1.  No acute intracranial abnormality. 2. Medium-sized right frontal scalp laceration/hematoma. No underlying skull fracture or facial fracture. 3. No acute fracture or static subluxation of the cervical spine. 4. New cervical lymphadenopathy compared to 08/19/2016, of uncertain significance. Electronically Signed   By: Deatra RobinsonKevin  Herman M.D.   On: 08/24/2016 00:57   Ct Cervical Spine Wo Contrast  Result Date: 08/24/2016  CLINICAL DATA:  Status post fall with right-sided headache EXAM: CT HEAD WITHOUT CONTRAST CT MAXILLOFACIAL WITHOUT CONTRAST CT CERVICAL SPINE WITHOUT CONTRAST TECHNIQUE: Multidetector CT imaging of the head, cervical spine, and maxillofacial structures were performed using the standard protocol without intravenous contrast. Multiplanar CT image reconstructions of the cervical spine and maxillofacial structures were also generated. COMPARISON:  CT head and cervical spine 08/19/2016 FINDINGS: CT HEAD FINDINGS Brain: No mass lesion, intraparenchymal hemorrhage or extra-axial collection. No evidence of acute cortical infarct. There is periventricular hypoattenuation compatible with chronic microvascular disease. Old right centrum semiovale lacunar infarct. Vascular: Mild carotid and vertebral artery calcification at the skullbase. Skull: No skull fracture identified. Medium-sized right frontal scalp hematoma/laceration. CT MAXILLOFACIAL FINDINGS Osseous: --Complex facial fracture types: No LeFort, zygomaticomaxillary complex or nasoorbitoethmoidal fracture. --Simple fracture types: None. --Mandible: No fracture or dislocation. Orbits: The globes appear intact. Normal appearance of the intra- and extraconal fat. Symmetric extraocular muscles. Sinuses: No fluid levels or advanced mucosal thickening. Soft tissues: Normal visualized extracranial facial soft tissues. CT CERVICAL SPINE FINDINGS Alignment: No acute static subluxation. Facets are aligned. Occipital condyles are normally positioned. Unchanged grade  1 C4-5 anterolisthesis. Skull base and vertebrae: No acute fracture. Soft tissues and spinal canal: No prevertebral fluid or swelling. No visible canal hematoma. Disc levels: Multilevel facet hypertrophy and fusion at the C5-T1 levels. No osseous spinal canal stenosis. Upper chest: No pneumothorax, pulmonary nodule or pleural effusion. Other: There is a left level 3 lymph node measuring 1.1 cm, new from the prior study (series 8, image 42). Enlarged lymph nodes are also noted right level 2. IMPRESSION: 1. No acute intracranial abnormality. 2. Medium-sized right frontal scalp laceration/hematoma. No underlying skull fracture or facial fracture. 3. No acute fracture or static subluxation of the cervical spine. 4. New cervical lymphadenopathy compared to 08/19/2016, of uncertain significance. Electronically Signed   By: Deatra Robinson M.D.   On: 08/24/2016 00:57   Ct Hip Right Wo Contrast  Result Date: 08/24/2016 CLINICAL DATA:  Status post fall, with right hip pain. Initial encounter. EXAM: CT OF THE RIGHT HIP WITHOUT CONTRAST TECHNIQUE: Multidetector CT imaging of the right hip was performed according to the standard protocol. Multiplanar CT image reconstructions were also generated. COMPARISON:  Pelvis radiograph performed 08/22/2016 FINDINGS: Bones/Joint/Cartilage There is no evidence of fracture or dislocation. The patient's right hip hemiarthroplasty is grossly unremarkable in appearance, without evidence of loosening. Mild underlying heterotopic bone formation is noted at the right acetabulum. Mild degenerative change is noted at the pubic symphysis. Ligaments Suboptimally assessed by CT. Muscles and Tendons The visualized musculature is grossly unremarkable. No tendon abnormalities are characterized. Soft tissues Visualized bowel loops are grossly unremarkable in appearance. The bladder is mildly distended and unremarkable in appearance. Scattered vascular calcifications are seen. IMPRESSION: 1. No evidence  of fracture or dislocation. 2. Right hip arthroplasty is unremarkable in appearance, without evidence of loosening. 3. Scattered vascular calcifications seen. Electronically Signed   By: Roanna Raider M.D.   On: 08/24/2016 01:00   Dg Chest Port 1 View  Result Date: 08/24/2016 CLINICAL DATA:  Fever with multiple falls last week. EXAM: PORTABLE CHEST 1 VIEW COMPARISON:  08/24/2016 FINDINGS: Cardiomegaly. Low lung volumes with bibasilar atelectasis. No visible effusions or acute bony abnormality. IMPRESSION: Cardiomegaly.  Bibasilar atelectasis. Electronically Signed   By: Charlett Nose M.D.   On: 08/24/2016 12:54   Ct Maxillofacial Wo Contrast  Result Date: 08/24/2016 CLINICAL DATA:  Status post fall with right-sided headache EXAM: CT HEAD WITHOUT  CONTRAST CT MAXILLOFACIAL WITHOUT CONTRAST CT CERVICAL SPINE WITHOUT CONTRAST TECHNIQUE: Multidetector CT imaging of the head, cervical spine, and maxillofacial structures were performed using the standard protocol without intravenous contrast. Multiplanar CT image reconstructions of the cervical spine and maxillofacial structures were also generated. COMPARISON:  CT head and cervical spine 08/19/2016 FINDINGS: CT HEAD FINDINGS Brain: No mass lesion, intraparenchymal hemorrhage or extra-axial collection. No evidence of acute cortical infarct. There is periventricular hypoattenuation compatible with chronic microvascular disease. Old right centrum semiovale lacunar infarct. Vascular: Mild carotid and vertebral artery calcification at the skullbase. Skull: No skull fracture identified. Medium-sized right frontal scalp hematoma/laceration. CT MAXILLOFACIAL FINDINGS Osseous: --Complex facial fracture types: No LeFort, zygomaticomaxillary complex or nasoorbitoethmoidal fracture. --Simple fracture types: None. --Mandible: No fracture or dislocation. Orbits: The globes appear intact. Normal appearance of the intra- and extraconal fat. Symmetric extraocular muscles.  Sinuses: No fluid levels or advanced mucosal thickening. Soft tissues: Normal visualized extracranial facial soft tissues. CT CERVICAL SPINE FINDINGS Alignment: No acute static subluxation. Facets are aligned. Occipital condyles are normally positioned. Unchanged grade 1 C4-5 anterolisthesis. Skull base and vertebrae: No acute fracture. Soft tissues and spinal canal: No prevertebral fluid or swelling. No visible canal hematoma. Disc levels: Multilevel facet hypertrophy and fusion at the C5-T1 levels. No osseous spinal canal stenosis. Upper chest: No pneumothorax, pulmonary nodule or pleural effusion. Other: There is a left level 3 lymph node measuring 1.1 cm, new from the prior study (series 8, image 42). Enlarged lymph nodes are also noted right level 2. IMPRESSION: 1. No acute intracranial abnormality. 2. Medium-sized right frontal scalp laceration/hematoma. No underlying skull fracture or facial fracture. 3. No acute fracture or static subluxation of the cervical spine. 4. New cervical lymphadenopathy compared to 08/19/2016, of uncertain significance. Electronically Signed   By: Deatra Robinson M.D.   On: 08/24/2016 00:57    EKG: Independently reviewed. Sinus tachycardia  Assessment/Plan Active Problems:   Bipolar depression (HCC)   Essential hypertension   Parkinson's disease (HCC)   Diabetes mellitus with neuropathy (HCC)   UTI (urinary tract infection)   Altered mental status   Constipation   Urinary tract infection Patient has a history of pansensitive E. Coli. Appears she was being treated with ciprofloxacin for an infection. Started on Keflex by the ED on previous encounter.  -continue ceftriaxone -urine culture -blood cultures  Altered mental status Patient is oriented. She is somewhat somnolent but does not appear overly altered. Possible she may have a post-concussive syndrome. Does have a diagnosis of Parkinson Disease as well -monitor mental status. Hopefully improves with  antibiotics  Parkinson disease Possibly contributing to recurrent falls -continue carbidopa-levodopa -PT/OT eval  Diabetes mellitus, insulin dependent On lantus 35u BID and aspart sliding scale -lantus 20u BID -SSI  Hypertension -continue benazepril  Hyperlipidemia -continue atorvastatin  Constipation -continue miralax -continue Senakot-s  Bipolar disorder -continue lamictal -continue abilify  Overactive bladder -hold oxybutynin   DVT prophylaxis: Lovenox Code Status: Full code Family Communication: Daughter at bedside Disposition Plan: Anticipate discharge back to SNF in 2-3 days Consults called: None Admission status: Inpatient, medical floor   Jacquelin Hawking, MD [facility] Pager [phone]  If 7PM-7AM, please contact night-coverage [email or web address] Password [facility]  08/24/2016, 6:55 PM

## 2016-08-25 ENCOUNTER — Inpatient Hospital Stay (HOSPITAL_COMMUNITY): Payer: Medicare Other

## 2016-08-25 ENCOUNTER — Encounter (HOSPITAL_COMMUNITY): Payer: Self-pay

## 2016-08-25 DIAGNOSIS — R4182 Altered mental status, unspecified: Secondary | ICD-10-CM

## 2016-08-25 DIAGNOSIS — Z794 Long term (current) use of insulin: Secondary | ICD-10-CM

## 2016-08-25 DIAGNOSIS — I10 Essential (primary) hypertension: Secondary | ICD-10-CM

## 2016-08-25 DIAGNOSIS — F3130 Bipolar disorder, current episode depressed, mild or moderate severity, unspecified: Secondary | ICD-10-CM

## 2016-08-25 DIAGNOSIS — G20 Parkinson's disease: Secondary | ICD-10-CM

## 2016-08-25 DIAGNOSIS — E1140 Type 2 diabetes mellitus with diabetic neuropathy, unspecified: Secondary | ICD-10-CM

## 2016-08-25 DIAGNOSIS — M7989 Other specified soft tissue disorders: Secondary | ICD-10-CM

## 2016-08-25 DIAGNOSIS — N3000 Acute cystitis without hematuria: Principal | ICD-10-CM

## 2016-08-25 DIAGNOSIS — K5900 Constipation, unspecified: Secondary | ICD-10-CM

## 2016-08-25 LAB — BASIC METABOLIC PANEL WITH GFR
Anion gap: 8 (ref 5–15)
BUN: 12 mg/dL (ref 6–20)
CO2: 27 mmol/L (ref 22–32)
Calcium: 8.3 mg/dL — ABNORMAL LOW (ref 8.9–10.3)
Chloride: 102 mmol/L (ref 101–111)
Creatinine, Ser: 0.91 mg/dL (ref 0.44–1.00)
GFR calc Af Amer: >60 mL/min (ref >60)
GFR calc non Af Amer: 58 mL/min — ABNORMAL LOW (ref >60)
Glucose, Bld: 140 mg/dL — ABNORMAL HIGH (ref 65–99)
Potassium: 4.6 mmol/L (ref 3.5–5.1)
Sodium: 137 mmol/L (ref 135–145)

## 2016-08-25 LAB — CBC
HCT: 41 % (ref 36.0–46.0)
Hemoglobin: 14.1 g/dL (ref 12.0–15.0)
MCH: 30.8 pg (ref 26.0–34.0)
MCHC: 34.4 g/dL (ref 30.0–36.0)
MCV: 89.5 fL (ref 78.0–100.0)
Platelets: 180 K/uL (ref 150–400)
RBC: 4.58 MIL/uL (ref 3.87–5.11)
RDW: 13.8 % (ref 11.5–15.5)
WBC: 12.8 K/uL — ABNORMAL HIGH (ref 4.0–10.5)

## 2016-08-25 LAB — GLUCOSE, CAPILLARY
GLUCOSE-CAPILLARY: 140 mg/dL — AB (ref 65–99)
Glucose-Capillary: 138 mg/dL — ABNORMAL HIGH (ref 65–99)
Glucose-Capillary: 143 mg/dL — ABNORMAL HIGH (ref 65–99)
Glucose-Capillary: 148 mg/dL — ABNORMAL HIGH (ref 65–99)

## 2016-08-25 LAB — URINE CULTURE
Culture: >=100000 — AB
Culture: NO GROWTH

## 2016-08-25 LAB — MRSA PCR SCREENING: MRSA by PCR: NEGATIVE

## 2016-08-25 NOTE — Progress Notes (Addendum)
[facility] PROGRESS NOTE  Nadara MustardJessie B Talent ZOX:[id number] DOB: 1935/06/05 DOA: 08/24/2016 PCP: Oneal GroutPANDEY, MAHIMA, MD  Interim summary and HPI 81 y.o. female with medical history significant of Parkinson disease, diabetes mellitus, hypertension, recurrent UTI, bipolar disorder. Patient presented to the ED secondary to altered mental status as reported by her skilled nursing facility and her daughter. She was recently evaluated in the ED for a fall. She has had multiple falls in the past week. Nothing has been given to help with her mental status. Her daughter states that she is not as talkative or as mobile as she usually is.  Assessment/Plan: Urinary tract infection Patient has a history of pansensitive E. Coli. Appears she was treated with ciprofloxacin for an infection. Started on Keflex by the ED on previous encounter (was supposed to still be on it on admission)  -continue ceftriaxone for now -will follow urine culture and blood cultures -no feevr, no dysuria  Altered mental status Patient is oriented, but somnolent and no talking much -Does have a diagnosis of Parkinson Disease as well -monitor mental status. Hopefully will continue improving with antibiotics -will check B12 and TSH  Parkinson disease -Possibly contributing to recurrent falls -continue carbidopa-levodopa -PT/OT eval recommending SNF  Diabetes mellitus, insulin dependent -will use lantus 20u BID and SSI  Hypertension -continue benazepril  Hyperlipidemia -continue atorvastatin  Constipation -continue miralax -continue Senakot-s  Bipolar disorder -continue lamictal -continue abilify  Overactive bladder -hold oxybutynin  Obesity, Morbid -Body mass index is 41.25 kg/m.  -low calorie and healthy choices discussed with patient and family.   Code Status: Full Family Communication: daughter at bedside  Disposition Plan: back to SNF when medically stable. Hopefully 1-2  days.   Consultants:  None   Procedures:  See below for x-ray reports   Antibiotics:  Rocephin 08/24/16  HPI/Subjective: Obtunded, no talking much, afebrile and in no distress.  Objective: Vitals:   08/25/16 1203 08/25/16 1428  BP:    Pulse:  89  Resp:  18  Temp: 99.8 F (37.7 C) 99.1 F (37.3 C)    Intake/Output Summary (Last 24 hours) at 08/25/16 1942 Last data filed at 08/25/16 1734  Gross per 24 hour  Intake              300 ml  Output                0 ml  Net              300 ml   Filed Weights   08/24/16 1131 08/24/16 2014  Weight: 111.1 kg (245 lb) 112.4 kg (247 lb 14.4 oz)    Exam:   General:  Afebrile, non toxic; multiple bruises in her face appreciated; left eyebrow laceration  Cardiovascular:S1 and S2, no rubs or gallops  Respiratory: scattered rhonchi, no wheezing, no crackles   Abdomen: soft, NT, ND, positive BS  Musculoskeletal: bruises in arms and legs (different healing stages); no cyanosis, trace edema bilaterally appreciated  Data Reviewed: Basic Metabolic Panel:  Recent Labs Lab 08/24/16 0102 08/24/16 1305 08/25/16 0442  NA 136 135 137  K 4.2 4.0 4.6  CL 101 102 102  CO2 26 24 27   GLUCOSE 289* 205* 140*  BUN 17 14 12   CREATININE 1.01* 0.80 0.91  CALCIUM 9.2 8.4* 8.3*   Liver Function Tests:  Recent Labs Lab 08/24/16 1305  AST 22  ALT 9*  ALKPHOS 91  BILITOT 0.7  PROT 6.7  ALBUMIN 3.1*   CBC:  Recent  Labs Lab 08/24/16 0102 08/24/16 1305 08/25/16 0442  WBC 11.3* 11.0* 12.8*  NEUTROABS 8.0* 8.0*  --   HGB 13.4 13.1 14.1  HCT 40.2 39.6 41.0  MCV 91.4 90.8 89.5  PLT 235 227 180   Cardiac Enzymes:  Recent Labs Lab 08/24/16 1305  TROPONINI <0.03   CBG:  Recent Labs Lab 08/24/16 2021 08/25/16 0759 08/25/16 1158 08/25/16 1640  GLUCAP 131* 138* 143* 148*    Recent Results (from the past 240 hour(s))  Urine culture     Status: Abnormal   Collection Time: 08/24/16  1:21 AM  Result Value Ref  Range Status   Specimen Description URINE, CLEAN CATCH  Final   Special Requests NONE  Final   Culture >=100,000 COLONIES/mL LACTOBACILLUS SPECIES (A)  Final   Report Status 08/25/2016 FINAL  Final  Blood Culture (routine x 2)     Status: None (Preliminary result)   Collection Time: 08/24/16 12:56 PM  Result Value Ref Range Status   Specimen Description BLOOD LEFT HAND  Final   Special Requests IN PEDIATRIC BOTTLE 3 CC  Final   Culture   Final    NO GROWTH < 24 HOURS Performed at [facility]    Report Status PENDING  Incomplete  Blood Culture (routine x 2)     Status: None (Preliminary result)   Collection Time: 08/24/16  1:06 PM  Result Value Ref Range Status   Specimen Description BLOOD RIGHT ANTECUBITAL  Final   Special Requests BOTTLES DRAWN AEROBIC AND ANAEROBIC 5 CC EA  Final   Culture   Final    NO GROWTH < 24 HOURS Performed at [facility]    Report Status PENDING  Incomplete  Urine culture     Status: None   Collection Time: 08/24/16  2:05 PM  Result Value Ref Range Status   Specimen Description URINE, CATHETERIZED  Final   Special Requests NONE  Final   Culture NO GROWTH Performed at [facility]   Final   Report Status 08/25/2016 FINAL  Final  MRSA PCR Screening     Status: None   Collection Time: 08/25/16  3:13 AM  Result Value Ref Range Status   MRSA by PCR NEGATIVE NEGATIVE Final    Comment:        The GeneXpert MRSA Assay (FDA approved for NASAL specimens only), is one component of a comprehensive MRSA colonization surveillance program. It is not intended to diagnose MRSA infection nor to guide or monitor treatment for MRSA infections.      Studies: Dg Chest 2 View  Result Date: 08/24/2016 CLINICAL DATA:  Status post fall, with decreased O2 saturation. Initial encounter. EXAM: CHEST  2 VIEW COMPARISON:  Chest radiograph from 05/30/2014 FINDINGS: The lungs are hypoexpanded. Vascular congestion is noted. There is no evidence  of focal opacification, pleural effusion or pneumothorax. The heart is mildly enlarged. No acute osseous abnormalities are seen. IMPRESSION: Lungs hypoexpanded. Vascular congestion and mild cardiomegaly. No displaced rib fractures identified. Electronically Signed   By: Roanna Raider M.D.   On: 08/24/2016 01:03   Ct Head Wo Contrast  Result Date: 08/24/2016 CLINICAL DATA:  Status post fall with right-sided headache EXAM: CT HEAD WITHOUT CONTRAST CT MAXILLOFACIAL WITHOUT CONTRAST CT CERVICAL SPINE WITHOUT CONTRAST TECHNIQUE: Multidetector CT imaging of the head, cervical spine, and maxillofacial structures were performed using the standard protocol without intravenous contrast. Multiplanar CT image reconstructions of the cervical spine and maxillofacial structures were also generated. COMPARISON:  CT  head and cervical spine 08/19/2016 FINDINGS: CT HEAD FINDINGS Brain: No mass lesion, intraparenchymal hemorrhage or extra-axial collection. No evidence of acute cortical infarct. There is periventricular hypoattenuation compatible with chronic microvascular disease. Old right centrum semiovale lacunar infarct. Vascular: Mild carotid and vertebral artery calcification at the skullbase. Skull: No skull fracture identified. Medium-sized right frontal scalp hematoma/laceration. CT MAXILLOFACIAL FINDINGS Osseous: --Complex facial fracture types: No LeFort, zygomaticomaxillary complex or nasoorbitoethmoidal fracture. --Simple fracture types: None. --Mandible: No fracture or dislocation. Orbits: The globes appear intact. Normal appearance of the intra- and extraconal fat. Symmetric extraocular muscles. Sinuses: No fluid levels or advanced mucosal thickening. Soft tissues: Normal visualized extracranial facial soft tissues. CT CERVICAL SPINE FINDINGS Alignment: No acute static subluxation. Facets are aligned. Occipital condyles are normally positioned. Unchanged grade 1 C4-5 anterolisthesis. Skull base and vertebrae: No  acute fracture. Soft tissues and spinal canal: No prevertebral fluid or swelling. No visible canal hematoma. Disc levels: Multilevel facet hypertrophy and fusion at the C5-T1 levels. No osseous spinal canal stenosis. Upper chest: No pneumothorax, pulmonary nodule or pleural effusion. Other: There is a left level 3 lymph node measuring 1.1 cm, new from the prior study (series 8, image 42). Enlarged lymph nodes are also noted right level 2. IMPRESSION: 1. No acute intracranial abnormality. 2. Medium-sized right frontal scalp laceration/hematoma. No underlying skull fracture or facial fracture. 3. No acute fracture or static subluxation of the cervical spine. 4. New cervical lymphadenopathy compared to 08/19/2016, of uncertain significance. Electronically Signed   By: Deatra RobinsonKevin  Herman M.D.   On: 08/24/2016 00:57   Ct Cervical Spine Wo Contrast  Result Date: 08/24/2016 CLINICAL DATA:  Status post fall with right-sided headache EXAM: CT HEAD WITHOUT CONTRAST CT MAXILLOFACIAL WITHOUT CONTRAST CT CERVICAL SPINE WITHOUT CONTRAST TECHNIQUE: Multidetector CT imaging of the head, cervical spine, and maxillofacial structures were performed using the standard protocol without intravenous contrast. Multiplanar CT image reconstructions of the cervical spine and maxillofacial structures were also generated. COMPARISON:  CT head and cervical spine 08/19/2016 FINDINGS: CT HEAD FINDINGS Brain: No mass lesion, intraparenchymal hemorrhage or extra-axial collection. No evidence of acute cortical infarct. There is periventricular hypoattenuation compatible with chronic microvascular disease. Old right centrum semiovale lacunar infarct. Vascular: Mild carotid and vertebral artery calcification at the skullbase. Skull: No skull fracture identified. Medium-sized right frontal scalp hematoma/laceration. CT MAXILLOFACIAL FINDINGS Osseous: --Complex facial fracture types: No LeFort, zygomaticomaxillary complex or nasoorbitoethmoidal fracture.  --Simple fracture types: None. --Mandible: No fracture or dislocation. Orbits: The globes appear intact. Normal appearance of the intra- and extraconal fat. Symmetric extraocular muscles. Sinuses: No fluid levels or advanced mucosal thickening. Soft tissues: Normal visualized extracranial facial soft tissues. CT CERVICAL SPINE FINDINGS Alignment: No acute static subluxation. Facets are aligned. Occipital condyles are normally positioned. Unchanged grade 1 C4-5 anterolisthesis. Skull base and vertebrae: No acute fracture. Soft tissues and spinal canal: No prevertebral fluid or swelling. No visible canal hematoma. Disc levels: Multilevel facet hypertrophy and fusion at the C5-T1 levels. No osseous spinal canal stenosis. Upper chest: No pneumothorax, pulmonary nodule or pleural effusion. Other: There is a left level 3 lymph node measuring 1.1 cm, new from the prior study (series 8, image 42). Enlarged lymph nodes are also noted right level 2. IMPRESSION: 1. No acute intracranial abnormality. 2. Medium-sized right frontal scalp laceration/hematoma. No underlying skull fracture or facial fracture. 3. No acute fracture or static subluxation of the cervical spine. 4. New cervical lymphadenopathy compared to 08/19/2016, of uncertain significance. Electronically Signed   By: Caryn BeeKevin  Chase Picket M.D.   On: 08/24/2016 00:57   Ct Hip Right Wo Contrast  Result Date: 08/24/2016 CLINICAL DATA:  Status post fall, with right hip pain. Initial encounter. EXAM: CT OF THE RIGHT HIP WITHOUT CONTRAST TECHNIQUE: Multidetector CT imaging of the right hip was performed according to the standard protocol. Multiplanar CT image reconstructions were also generated. COMPARISON:  Pelvis radiograph performed 08/22/2016 FINDINGS: Bones/Joint/Cartilage There is no evidence of fracture or dislocation. The patient's right hip hemiarthroplasty is grossly unremarkable in appearance, without evidence of loosening. Mild underlying heterotopic bone  formation is noted at the right acetabulum. Mild degenerative change is noted at the pubic symphysis. Ligaments Suboptimally assessed by CT. Muscles and Tendons The visualized musculature is grossly unremarkable. No tendon abnormalities are characterized. Soft tissues Visualized bowel loops are grossly unremarkable in appearance. The bladder is mildly distended and unremarkable in appearance. Scattered vascular calcifications are seen. IMPRESSION: 1. No evidence of fracture or dislocation. 2. Right hip arthroplasty is unremarkable in appearance, without evidence of loosening. 3. Scattered vascular calcifications seen. Electronically Signed   By: Roanna Raider M.D.   On: 08/24/2016 01:00   Dg Chest Port 1 View  Result Date: 08/24/2016 CLINICAL DATA:  Fever with multiple falls last week. EXAM: PORTABLE CHEST 1 VIEW COMPARISON:  08/24/2016 FINDINGS: Cardiomegaly. Low lung volumes with bibasilar atelectasis. No visible effusions or acute bony abnormality. IMPRESSION: Cardiomegaly.  Bibasilar atelectasis. Electronically Signed   By: Charlett Nose M.D.   On: 08/24/2016 12:54   Ct Maxillofacial Wo Contrast  Result Date: 08/24/2016 CLINICAL DATA:  Status post fall with right-sided headache EXAM: CT HEAD WITHOUT CONTRAST CT MAXILLOFACIAL WITHOUT CONTRAST CT CERVICAL SPINE WITHOUT CONTRAST TECHNIQUE: Multidetector CT imaging of the head, cervical spine, and maxillofacial structures were performed using the standard protocol without intravenous contrast. Multiplanar CT image reconstructions of the cervical spine and maxillofacial structures were also generated. COMPARISON:  CT head and cervical spine 08/19/2016 FINDINGS: CT HEAD FINDINGS Brain: No mass lesion, intraparenchymal hemorrhage or extra-axial collection. No evidence of acute cortical infarct. There is periventricular hypoattenuation compatible with chronic microvascular disease. Old right centrum semiovale lacunar infarct. Vascular: Mild carotid and  vertebral artery calcification at the skullbase. Skull: No skull fracture identified. Medium-sized right frontal scalp hematoma/laceration. CT MAXILLOFACIAL FINDINGS Osseous: --Complex facial fracture types: No LeFort, zygomaticomaxillary complex or nasoorbitoethmoidal fracture. --Simple fracture types: None. --Mandible: No fracture or dislocation. Orbits: The globes appear intact. Normal appearance of the intra- and extraconal fat. Symmetric extraocular muscles. Sinuses: No fluid levels or advanced mucosal thickening. Soft tissues: Normal visualized extracranial facial soft tissues. CT CERVICAL SPINE FINDINGS Alignment: No acute static subluxation. Facets are aligned. Occipital condyles are normally positioned. Unchanged grade 1 C4-5 anterolisthesis. Skull base and vertebrae: No acute fracture. Soft tissues and spinal canal: No prevertebral fluid or swelling. No visible canal hematoma. Disc levels: Multilevel facet hypertrophy and fusion at the C5-T1 levels. No osseous spinal canal stenosis. Upper chest: No pneumothorax, pulmonary nodule or pleural effusion. Other: There is a left level 3 lymph node measuring 1.1 cm, new from the prior study (series 8, image 42). Enlarged lymph nodes are also noted right level 2. IMPRESSION: 1. No acute intracranial abnormality. 2. Medium-sized right frontal scalp laceration/hematoma. No underlying skull fracture or facial fracture. 3. No acute fracture or static subluxation of the cervical spine. 4. New cervical lymphadenopathy compared to 08/19/2016, of uncertain significance. Electronically Signed   By: Deatra Robinson M.D.   On: 08/24/2016 00:57    Scheduled Meds: .  ARIPiprazole  2 mg Oral Daily  . aspirin  81 mg Oral Daily  . atorvastatin  10 mg Oral Daily  . benazepril  20 mg Oral Daily  . carbidopa-levodopa  1 tablet Oral BID  . cefTRIAXone (ROCEPHIN)  IV  1 g Intravenous Q24H  . DULoxetine  60 mg Oral Daily  . enoxaparin (LOVENOX) injection  40 mg Subcutaneous Q24H   . gabapentin  200 mg Oral BID  . insulin aspart  0-15 Units Subcutaneous TID WC  . insulin aspart  0-5 Units Subcutaneous QHS  . insulin glargine  20 Units Subcutaneous BID  . lamoTRIgine  100 mg Oral BID  . metoprolol tartrate  25 mg Oral BID  . nystatin   Topical BID  . pantoprazole  20 mg Oral QHS  . polyethylene glycol  17 g Oral BID  . polyvinyl alcohol  1 drop Both Eyes BID  . senna-docusate  2 tablet Oral BID   Continuous Infusions:  Active Problems:   Bipolar depression (HCC)   Essential hypertension   Parkinson's disease (HCC)   Diabetes mellitus with neuropathy (HCC)   UTI (urinary tract infection)   Altered mental status   Constipation    Time spent: 25 minutes    Vassie Loll  [facility] Pager [phone]. If 7PM-7AM, please contact night-coverage at [email or web address], password [facility] 08/25/2016, 7:42 PM  LOS: 1 day

## 2016-08-25 NOTE — NC FL2 (Signed)
[place] MEDICAID FL2 LEVEL OF CARE SCREENING TOOL     IDENTIFICATION  Patient Name: Maureen Taylor Birthdate: April 05, 1935 Sex: female Admission Date (Current Location): 08/24/2016  [facility] [place]   Fac[facility] and Address[facility]  [facility],  [address], [place] [id number]      Provider Number: [id number]  Attending Physician Name and Address:  Vassie Lollarlos Madera, MD  Relative Name and Phone Number:       Current Level of Care: Hospital Recommended Level of Care: Skilled Nursing Facility Prior Approval Number:    Date Approved/Denied:   PASRR Number:    Discharge Plan: SNF    Current Diagnoses: Patient Active Problem List   Diagnosis Date Noted  . UTI (urinary tract infection) 08/24/2016  . Altered mental status 08/24/2016  . Constipation 08/24/2016  . Hyperlipidemia 06/05/2015  . Type 2 diabetes mellitus with diabetic polyneuropathy (HCC) 06/05/2015  . Vitamin D deficiency 06/05/2015  . Recurrent UTI 04/17/2015  . Vaginal atrophy 04/17/2015  . Incontinence 04/17/2015  . OAB (overactive bladder) 03/03/2015  . Bipolar depression (HCC) 01/05/2015  . GERD (gastroesophageal reflux disease) 01/05/2015  . Allergic rhinitis 01/05/2015  . Essential hypertension 01/05/2015  . Oral thrush 01/05/2015  . Parkinson's disease (HCC) 01/05/2015  . Neuropathy (HCC) 01/05/2015  . Diabetes mellitus with neuropathy (HCC) 01/05/2015    Orientation RESPIRATION BLADDER Height & Weight     Self, Place  Normal Incontinent Weight: 247 lb 14.4 oz (112.4 kg) Height:  5\' 5"  (165.1 cm)  BEHAVIORAL SYMPTOMS/MOOD NEUROLOGICAL BOWEL NUTRITION STATUS      Continent Diet (carb modified)  AMBULATORY STATUS COMMUNICATION OF NEEDS Skin   Extensive Assist Verbally Normal                       Personal Care Assistance Level of Assistance  Bathing, Feeding, Dressing Bathing Assistance: Limited assistance Feeding assistance: Limited assistance Dressing  Assistance: Limited assistance     Functional Limitations Info  Sight, Hearing, Speech Sight Info: Adequate Hearing Info: Adequate Speech Info: Impaired (at current time, baseline patient is able to communicate.)    SPECIAL CARE FACTORS FREQUENCY  PT (By licensed PT), OT (By licensed OT)     PT Frequency: 5 OT Frequency: 5            Contractures Contractures Info: Not present    Additional Factors Info  Code Status, Allergies, Psychotropic, Insulin Sliding Scale Code Status Info: Full Code Allergies Info:  Codeine, Morphine And Related, Indocin Indomethacin Psychotropic Info: Abilify, Cymbalta Insulin Sliding Scale Info: see MAR       Current Medications (08/25/2016):  This is the current hospital active medication list Current Facility-Administered Medications  Medication Dose Route Frequency Provider Last Rate Last Dose  . acetaminophen (TYLENOL) tablet 650 mg  650 mg Oral Q6H PRN Narda Bondsalph A Nettey, MD       Or  . acetaminophen (TYLENOL) suppository 650 mg  650 mg Rectal Q6H PRN Narda Bondsalph A Nettey, MD      . ARIPiprazole (ABILIFY) tablet 2 mg  2 mg Oral Daily Narda Bondsalph A Nettey, MD   2 mg at 08/24/16 2202  . aspirin chewable tablet 81 mg  81 mg Oral Daily Narda Bondsalph A Nettey, MD      . atorvastatin (LIPITOR) tablet 10 mg  10 mg Oral Daily Narda Bondsalph A Nettey, MD      . benazepril (LOTENSIN) tablet 20 mg  20 mg Oral Daily Narda Bondsalph A Nettey, MD      .  carbidopa-levodopa (SINEMET IR) 25-100 MG per tablet immediate release 1 tablet  1 tablet Oral BID Narda Bondsalph A Nettey, MD   1 tablet at 08/24/16 2202  . cefTRIAXone (ROCEPHIN) 1 g in dextrose 5 % 50 mL IVPB  1 g Intravenous Q24H Narda Bondsalph A Nettey, MD      . DULoxetine (CYMBALTA) DR capsule 60 mg  60 mg Oral Daily Narda Bondsalph A Nettey, MD      . enoxaparin (LOVENOX) injection 40 mg  40 mg Subcutaneous Q24H Narda Bondsalph A Nettey, MD   40 mg at 08/24/16 2202  . gabapentin (NEURONTIN) capsule 200 mg  200 mg Oral BID Narda Bondsalph A Nettey, MD   200 mg at 08/24/16 2202  .  insulin aspart (novoLOG) injection 0-15 Units  0-15 Units Subcutaneous TID WC Narda Bondsalph A Nettey, MD   2 Units at 08/25/16 [id number]  . insulin aspart (novoLOG) injection 0-5 Units  0-5 Units Subcutaneous QHS Narda Bondsalph A Nettey, MD      . insulin glargine (LANTUS) injection 20 Units  20 Units Subcutaneous BID Narda Bondsalph A Nettey, MD   20 Units at 08/24/16 2202  . lamoTRIgine (LAMICTAL) tablet 100 mg  100 mg Oral BID Narda Bondsalph A Nettey, MD   100 mg at 08/24/16 2202  . metoprolol tartrate (LOPRESSOR) tablet 25 mg  25 mg Oral BID Narda Bondsalph A Nettey, MD   25 mg at 08/24/16 2202  . nystatin (MYCOSTATIN/NYSTOP) topical powder   Topical BID Ankit Nanavati, MD      . pantoprazole (PROTONIX) EC tablet 20 mg  20 mg Oral QHS Narda Bondsalph A Nettey, MD   20 mg at 08/24/16 2202  . polyethylene glycol (MIRALAX / GLYCOLAX) packet 17 g  17 g Oral BID Narda Bondsalph A Nettey, MD   17 g at 08/24/16 2203  . polyvinyl alcohol (LIQUIFILM TEARS) 1.4 % ophthalmic solution 1 drop  1 drop Both Eyes BID Narda Bondsalph A Nettey, MD   1 drop at 08/24/16 2203  . senna-docusate (Senokot-S) tablet 2 tablet  2 tablet Oral BID Narda Bondsalph A Nettey, MD   2 tablet at 08/24/16 2202     Discharge Medications: Please see discharge summary for a list of discharge medications.  Relevant Imaging Results:  Relevant Lab Results:   Additional Information SSN:  [id number]  Raye SorrowCoble, Manas Hickling N, [place]

## 2016-08-25 NOTE — Progress Notes (Signed)
**  Preliminary report by tech**  Right lower extremity venous duplex complete.  Technically limited study due to patient body habitus, edema, depth of vessels, immobility, and acoustic shadowing. There is no obvious evidence of deep or superficial vein thrombosis involving the right lower extremity. All clearly visualized vessels appear patent and compressible. There is no evidence of a Baker's cyst on the right.  08/25/16 10:13 AM Olen CordialGreg Sebron Mcmahill RVT

## 2016-08-25 NOTE — Evaluation (Signed)
Physical Therapy Evaluation Patient Details Name: Nadara MustardJessie B Plemons MRN: [id number] DOB: [birth year] Today's Date: 08/25/2016   History of Present Illness  81 y.o. female with medical history significant of Parkinson disease, diabetes mellitus, hypertension, recurrent UTI, bipolar disorder and admitted from SNF due to multiple falls over last week and altered mental status, found to have UTI  Clinical Impression  Pt admitted with above diagnosis. Pt currently with functional limitations due to the deficits listed below (see PT Problem List).  Pt will benefit from skilled PT to increase their independence and safety with mobility to allow discharge to the venue listed below.  Pt with decreased cognition and lethargy during evaluation.  Daughter present and provided previous level of function information and states pt is typically able to ambulate with rollator with supervision.     Follow Up Recommendations SNF    Equipment Recommendations  None recommended by PT    Recommendations for Other Services       Precautions / Restrictions Precautions Precautions: Fall      Mobility  Bed Mobility Overal bed mobility: Needs Assistance;+2 for physical assistance Bed Mobility: Supine to Sit;Sit to Supine     Supine to sit: Total assist;HOB elevated;+2 for physical assistance Sit to supine: Total assist;+2 for physical assistance   General bed mobility comments: multimodal cues for pt to assist however pt unable - cognition? weakness?  Transfers                 General transfer comment: not safe to perform further mobility due to cognitive status and lethargy  Ambulation/Gait                Stairs            Wheelchair Mobility    Modified Rankin (Stroke Patients Only)       Balance Overall balance assessment: Needs assistance Sitting-balance support: Bilateral upper extremity supported;Feet supported Sitting balance-Leahy Scale: Zero Sitting balance -  Comments: requires external assist for trunk support Postural control: Posterior lean                                   Pertinent Vitals/Pain Pain Assessment: Faces Pain Score: 6  Pain Location: with R LE heel slide Pain Descriptors / Indicators: Grimacing Pain Intervention(s): Monitored during session;Limited activity within patient's tolerance    Home Living Family/patient expects to be discharged to:: Skilled nursing facility                      Prior Function Level of Independence: Needs assistance         Comments: typically ambulatory with rollator however supposed to have staff assist her with ADLs and mobility     Hand Dominance        Extremity/Trunk Assessment               Lower Extremity Assessment: Generalized weakness         Communication   Communication: Other (comment) (expressive and possible receptive difficulty with altered mental status)  Cognition Arousal/Alertness: Lethargic   Overall Cognitive Status: Impaired/Different from baseline Area of Impairment: Orientation;Following commands Orientation Level: Disoriented to;Place;Time;Situation     Following Commands: Follows one step commands inconsistently       General Comments: pt falls asleep easily, unable to state location however was able to state her daughter's name    General Comments General comments (skin integrity, edema,  etc.): yellowish ecchymosis around bil eyes and stitches across forehead from falls this week per daughter, also observed bil anterior knee abrasions    Exercises     Assessment/Plan    PT Assessment Patient needs continued PT services  PT Problem List Decreased strength;Decreased activity tolerance;Decreased balance;Decreased knowledge of use of DME;Decreased cognition;Decreased mobility;Obesity          PT Treatment Interventions DME instruction;Gait training;Functional mobility training;Balance training;Therapeutic  exercise;Therapeutic activities;Patient/family education;Wheelchair mobility training    PT Goals (Current goals can be found in the Care Plan section)  Acute Rehab PT Goals PT Goal Formulation: With family Time For Goal Achievement: 09/08/16 Potential to Achieve Goals: Fair    Frequency Min 3X/week   Barriers to discharge        Co-evaluation               End of Session   Activity Tolerance: Patient limited by lethargy Patient left: in bed;with call bell/phone within reach;with bed alarm set;with family/visitor present Nurse Communication: Mobility status         Time: 1310-1327 PT Time Calculation (min) (ACUTE ONLY): 17 min   Charges:   PT Evaluation $PT Eval Moderate Complexity: 1 Procedure     PT G Codes:        Alik Mawson,KATHrine E 08/25/2016, 3:15 PM Zenovia JarredKati Bonifacio Pruden, PT, DPT 08/25/2016 Pager: [phone]

## 2016-08-26 DIAGNOSIS — G9340 Encephalopathy, unspecified: Secondary | ICD-10-CM

## 2016-08-26 LAB — GLUCOSE, CAPILLARY
GLUCOSE-CAPILLARY: 180 mg/dL — AB (ref 65–99)
GLUCOSE-CAPILLARY: 180 mg/dL — AB (ref 65–99)
Glucose-Capillary: 109 mg/dL — ABNORMAL HIGH (ref 65–99)
Glucose-Capillary: 209 mg/dL — ABNORMAL HIGH (ref 65–99)

## 2016-08-26 LAB — TSH: TSH: 1.587 u[IU]/mL (ref 0.350–4.500)

## 2016-08-26 LAB — VITAMIN B12: VITAMIN B 12: 1267 pg/mL — AB (ref 180–914)

## 2016-08-26 LAB — T4, FREE: FREE T4: 0.89 ng/dL (ref 0.61–1.12)

## 2016-08-26 MED ORDER — INSULIN GLARGINE 100 UNIT/ML ~~LOC~~ SOLN
25.0000 [IU] | Freq: Two times a day (BID) | SUBCUTANEOUS | Status: DC
  Administered 2016-08-26 – 2016-08-27 (×2): 25 [IU] via SUBCUTANEOUS
  Filled 2016-08-26 (×3): qty 0.25

## 2016-08-26 NOTE — Progress Notes (Signed)
OT Cancellation Note  Patient Details Name: Maureen MustardJessie B Briggs MRN: [id number] DOB: April 03, 1935   Cancelled Treatment:    Noted pt from a SNF- will [facility] OT [facility] Karin GoldenLorraine D 08/26/2016, 12:12 PM

## 2016-08-26 NOTE — Progress Notes (Signed)
[facility] PROGRESS NOTE  Maureen Taylor:[id number] DOB: [birth year] DOA: 08/24/2016 PCP: Oneal Grout, MD  Interim summary and HPI 81 y.o. female with medical history significant of Parkinson disease, diabetes mellitus, hypertension, recurrent UTI, bipolar disorder. Patient presented to the ED secondary to altered mental status as reported by her skilled nursing facility and her daughter. She was recently evaluated in the ED for a fall. She has had multiple falls in the past week. Nothing has been given to help with her mental status. Her daughter states that she is not as talkative or as mobile as she usually is.  Assessment/Plan: Urinary tract infection Patient has a history of pansensitive E. Coli. Appears she was treated with ciprofloxacin for an infection. Started on Keflex by the ED on previous encounter (was supposed to still be on it on admission). -continue ceftriaxone for now -urine culture demonstrating >100,000 lactobacillus microorganism and blood cultures has remained neg -low grade temp overnight; and mild dysuria today  Altered mental status Patient is oriented, but somnolent and no talking much -Does have a diagnosis of Parkinson Disease as well -monitor mental status. Hopefully will continue improving with antibiotics -B12 and TSH WNL   Parkinson disease -Possibly contributing to recurrent falls -continue carbidopa-levodopa -PT/OT eval recommending SNF  Diabetes mellitus, insulin dependent -will use lantus 20u BID and SSI -CBG's elevated, will adjust lantus further   Hypertension -continue benazepril -BP stable   Hyperlipidemia -continue atorvastatin  Constipation -continue miralax -continue Senakot-s  Bipolar disorder -continue lamictal -continue abilify  Overactive bladder -continue holding oxybutynin  Obesity, Morbid -Body mass index is 41.25 kg/m.  -low calorie and healthy choices discussed with patient and  family.   Code Status: Full Family Communication: daughter at bedside  Disposition Plan: back to SNF when medically stable. Hopefully 1-2 days.   Consultants:  None   Procedures:  See below for x-ray reports   Antibiotics:  Rocephin 08/24/16  HPI/Subjective: Low grade temp, no CP, no SOB. Reports mild dysuria symptoms today. Patient is more alert/oriented (X2)  Objective: Vitals:   08/26/16 1006 08/26/16 1351  BP: (!) 144/58 138/67  Pulse: (!) 101 80  Resp: 20 18  Temp:  99.9 F (37.7 C)    Intake/Output Summary (Last 24 hours) at 08/26/16 1734 Last data filed at 08/26/16 1233  Gross per 24 hour  Intake              170 ml  Output                0 ml  Net              170 ml   Filed Weights   08/24/16 1131 08/24/16 2014  Weight: 111.1 kg (245 lb) 112.4 kg (247 lb 14.4 oz)    Exam:   General:  Afebrile, non toxic; multiple bruises in her face appreciated; left eyebrow laceration. Patient is more awake today and answering questions/following commands.   Cardiovascular:S1 and S2, no rubs or gallops  Respiratory: scattered rhonchi, no wheezing, no crackles   Abdomen: soft, NT, ND, positive BS  Musculoskeletal: bruises in arms and legs (different healing stages); no cyanosis, trace edema bilaterally appreciated  Data Reviewed: Basic Metabolic Panel:  Recent Labs Lab 08/24/16 0102 08/24/16 1305 08/25/16 0442  NA 136 135 137  K 4.2 4.0 4.6  CL 101 102 102  CO2 26 24 27   GLUCOSE 289* 205* 140*  BUN 17 14 12   CREATININE 1.01* 0.80 0.91  CALCIUM 9.2  8.4* 8.3*   Liver Function Tests:  Recent Labs Lab 08/24/16 1305  AST 22  ALT 9*  ALKPHOS 91  BILITOT 0.7  PROT 6.7  ALBUMIN 3.1*   CBC:  Recent Labs Lab 08/24/16 0102 08/24/16 1305 08/25/16 0442  WBC 11.3* 11.0* 12.8*  NEUTROABS 8.0* 8.0*  --   HGB 13.4 13.1 14.1  HCT 40.2 39.6 41.0  MCV 91.4 90.8 89.5  PLT 235 227 180   Cardiac Enzymes:  Recent Labs Lab 08/24/16 1305   TROPONINI <0.03   CBG:  Recent Labs Lab 08/25/16 1640 08/25/16 2117 08/26/16 0759 08/26/16 1129 08/26/16 1700  GLUCAP 148* 140* 109* 180* 209*    Recent Results (from the past 240 hour(s))  Urine culture     Status: Abnormal   Collection Time: 08/24/16  1:21 AM  Result Value Ref Range Status   Specimen Description URINE, CLEAN CATCH  Final   Special Requests NONE  Final   Culture >=100,000 COLONIES/mL LACTOBACILLUS SPECIES (A)  Final   Report Status 08/25/2016 FINAL  Final  Blood Culture (routine x 2)     Status: None (Preliminary result)   Collection Time: 08/24/16 12:56 PM  Result Value Ref Range Status   Specimen Description BLOOD LEFT HAND  Final   Special Requests IN PEDIATRIC BOTTLE 3 CC  Final   Culture   Final    NO GROWTH 2 DAYS [facility]    Report Status PENDING  Incomplete  Blood Culture (routine x 2)     Status: None (Preliminary result)   Collection Time: 08/24/16  1:06 PM  Result Value Ref Range Status   Specimen Description BLOOD RIGHT ANTECUBITAL  Final   Special Requests BOTTLES DRAWN AEROBIC AND ANAEROBIC 5 CC EA  Final   Culture   Final    NO GROWTH 2 DAYS [facility]    Report Status PENDING  Incomplete  Urine culture     Status: None   Collection Time: 08/24/16  2:05 PM  Result Value Ref Range Status   Specimen Description URINE, CATHETERIZED  Final   Special Requests NONE  Final   Culture NO GROWTH [facility]formed [facility]   Final   Report Status 08/25/2016 FINAL  Final  MRSA PCR Screening     Status: None   Collection Time: 08/25/16  3:13 AM  Result Value Ref Range Status   MRSA by PCR NEGATIVE NEGATIVE Final    Comment:        The GeneXpert MRSA Assay (FDA approved for NASAL specimens only), is one component of a comprehensive MRSA colonization surveillance program. It is not intended to diagnose MRSA infection nor to guide or monitor treatment for MRSA infections.       Studies: No results found.  Scheduled Meds: . ARIPiprazole  2 mg Oral Daily  . aspirin  81 mg Oral Daily  . atorvastatin  10 mg Oral Daily  . benazepril  20 mg Oral Daily  . carbidopa-levodopa  1 tablet Oral BID  . cefTRIAXone (ROCEPHIN)  IV  1 g Intravenous Q24H  . DULoxetine  60 mg Oral Daily  . enoxaparin (LOVENOX) injection  40 mg Subcutaneous Q24H  . gabapentin  200 mg Oral BID  . insulin aspart  0-15 Units Subcutaneous TID WC  . insulin aspart  0-5 Units Subcutaneous QHS  . insulin glargine  20 Units Subcutaneous BID  . lamoTRIgine  100 mg Oral BID  . metoprolol tartrate  25 mg Oral BID  . nystatin   Topical BID  . pantoprazole  20 mg Oral QHS  . polyethylene glycol  17 g Oral BID  . polyvinyl alcohol  1 drop Both Eyes BID  . senna-docusate  2 tablet Oral BID   Continuous Infusions:  Active Problems:   Bipolar depression (HCC)   Essential hypertension   Parkinson's disease (HCC)   Diabetes mellitus with neuropathy (HCC)   UTI (urinary tract infection)   Altered mental status   Constipation    Time spent: 25 minutes    Vassie LollMadera, Jameelah Watts  [facility] Pager [phone]. If 7PM-7AM, please contact night-coverage at [email or web address], password [email or web address] 08/26/2016, 5:34 PM  LOS: 2 days

## 2016-08-27 DIAGNOSIS — F0390 Unspecified dementia without behavioral disturbance: Secondary | ICD-10-CM | POA: Diagnosis not present

## 2016-08-27 DIAGNOSIS — N3281 Overactive bladder: Secondary | ICD-10-CM | POA: Diagnosis not present

## 2016-08-27 DIAGNOSIS — F3130 Bipolar disorder, current episode depressed, mild or moderate severity, unspecified: Secondary | ICD-10-CM | POA: Diagnosis not present

## 2016-08-27 DIAGNOSIS — R410 Disorientation, unspecified: Secondary | ICD-10-CM

## 2016-08-27 DIAGNOSIS — B379 Candidiasis, unspecified: Secondary | ICD-10-CM

## 2016-08-27 DIAGNOSIS — B9620 Unspecified Escherichia coli [E. coli] as the cause of diseases classified elsewhere: Secondary | ICD-10-CM | POA: Diagnosis not present

## 2016-08-27 DIAGNOSIS — K5900 Constipation, unspecified: Secondary | ICD-10-CM | POA: Diagnosis not present

## 2016-08-27 DIAGNOSIS — L22 Diaper dermatitis: Secondary | ICD-10-CM | POA: Diagnosis not present

## 2016-08-27 DIAGNOSIS — S0181XS Laceration without foreign body of other part of head, sequela: Secondary | ICD-10-CM | POA: Diagnosis not present

## 2016-08-27 DIAGNOSIS — G20 Parkinson's disease: Secondary | ICD-10-CM | POA: Diagnosis not present

## 2016-08-27 DIAGNOSIS — B372 Candidiasis of skin and nail: Secondary | ICD-10-CM | POA: Diagnosis not present

## 2016-08-27 DIAGNOSIS — R531 Weakness: Secondary | ICD-10-CM | POA: Diagnosis not present

## 2016-08-27 DIAGNOSIS — N390 Urinary tract infection, site not specified: Secondary | ICD-10-CM | POA: Diagnosis not present

## 2016-08-27 DIAGNOSIS — N3000 Acute cystitis without hematuria: Secondary | ICD-10-CM

## 2016-08-27 DIAGNOSIS — R488 Other symbolic dysfunctions: Secondary | ICD-10-CM | POA: Diagnosis not present

## 2016-08-27 DIAGNOSIS — F329 Major depressive disorder, single episode, unspecified: Secondary | ICD-10-CM | POA: Diagnosis not present

## 2016-08-27 DIAGNOSIS — Z79899 Other long term (current) drug therapy: Secondary | ICD-10-CM | POA: Diagnosis not present

## 2016-08-27 DIAGNOSIS — E1142 Type 2 diabetes mellitus with diabetic polyneuropathy: Secondary | ICD-10-CM | POA: Diagnosis not present

## 2016-08-27 DIAGNOSIS — R4182 Altered mental status, unspecified: Secondary | ICD-10-CM | POA: Diagnosis not present

## 2016-08-27 DIAGNOSIS — G629 Polyneuropathy, unspecified: Secondary | ICD-10-CM | POA: Diagnosis not present

## 2016-08-27 DIAGNOSIS — D509 Iron deficiency anemia, unspecified: Secondary | ICD-10-CM | POA: Diagnosis not present

## 2016-08-27 DIAGNOSIS — D649 Anemia, unspecified: Secondary | ICD-10-CM | POA: Diagnosis not present

## 2016-08-27 DIAGNOSIS — I10 Essential (primary) hypertension: Secondary | ICD-10-CM | POA: Diagnosis not present

## 2016-08-27 LAB — GLUCOSE, CAPILLARY
GLUCOSE-CAPILLARY: 149 mg/dL — AB (ref 65–99)
Glucose-Capillary: 175 mg/dL — ABNORMAL HIGH (ref 65–99)

## 2016-08-27 LAB — BASIC METABOLIC PANEL
Anion gap: 8 (ref 5–15)
BUN: 19 mg/dL (ref 6–20)
CHLORIDE: 101 mmol/L (ref 101–111)
CO2: 28 mmol/L (ref 22–32)
Calcium: 8.5 mg/dL — ABNORMAL LOW (ref 8.9–10.3)
Creatinine, Ser: 0.89 mg/dL (ref 0.44–1.00)
GFR calc Af Amer: >60 mL/min (ref >60)
GFR calc non Af Amer: >60 mL/min (ref >60)
Glucose, Bld: 159 mg/dL — ABNORMAL HIGH (ref 65–99)
Potassium: 3.8 mmol/L (ref 3.5–5.1)
SODIUM: 137 mmol/L (ref 135–145)

## 2016-08-27 LAB — CBC
HCT: 39.4 % (ref 36.0–46.0)
HEMOGLOBIN: 13 g/dL (ref 12.0–15.0)
MCH: 30.1 pg (ref 26.0–34.0)
MCHC: 33 g/dL (ref 30.0–36.0)
MCV: 91.2 fL (ref 78.0–100.0)
Platelets: 231 10*3/uL (ref 150–400)
RBC: 4.32 MIL/uL (ref 3.87–5.11)
RDW: 13.7 % (ref 11.5–15.5)
WBC: 9.9 10*3/uL (ref 4.0–10.5)

## 2016-08-27 MED ORDER — PHENAZOPYRIDINE HCL 100 MG PO TABS: 100.0000 mg | ORAL_TABLET | Freq: Three times a day (TID) | ORAL | Status: DC | PRN

## 2016-08-27 MED ORDER — CEFUROXIME AXETIL 500 MG PO TABS: 500.0000 mg | ORAL_TABLET | Freq: Two times a day (BID) | ORAL | Status: AC

## 2016-08-27 MED ORDER — FLUCONAZOLE 100 MG PO TABS: 100.0000 mg | ORAL_TABLET | Freq: Every day | ORAL | Status: AC

## 2016-08-27 MED ORDER — INSULIN GLARGINE 100 UNIT/ML ~~LOC~~ SOLN: 35.0000 [IU] | Freq: Two times a day (BID) | SUBCUTANEOUS | Status: DC

## 2016-08-27 NOTE — Clinical Social Work Note (Signed)
Ok per MD [facility]. [facility] and RN to call report.  EMS transportation arranged.  Daughter Delaney MeigsJoni is aware of d/c plan and denies any questions.  Patient is alert but confused.  CSW services will sign off.  Lorri Frederickonna T. Jaci LazierCrowder, KentuckyLCSW [phone] (weekend coverage)

## 2016-08-27 NOTE — Discharge Summary (Signed)
Physician Discharge Summary  Maureen Taylor ZOX:[id number] DOB: June 10, 1935 DOA: 08/24/2016  PCP: Oneal Grout, MD  Admit date: 08/24/2016 Discharge date: 08/27/2016  Time spent: 35 minutes  Recommendations for Outpatient Follow-up:  Diflucan for 3 days Antibiotics for 5 more days Pyridium up to 3 times a day for dysuria symptoms. Repeat BMET in 5 days to follow electrolytes and renal function    Discharge Diagnoses:  Active Problems:   Bipolar depression (HCC)   Essential hypertension   Parkinson's disease (HCC)   Diabetes mellitus with neuropathy (HCC)   UTI (urinary tract infection)   Altered mental status   Constipation   Acute cystitis without hematuria   Morbid obesity (HCC)   Discharge Condition: stable and improved. Discharge to SNF for further care and rehabilitation.  Diet recommendation: modified carb diet and heart healthy   Filed Weights   08/24/16 1131 08/24/16 2014  Weight: 111.1 kg (245 lb) 112.4 kg (247 lb 14.4 oz)    History of present illness:  81 y.o.femalewith medical history significant of Parkinson disease, diabetes mellitus, hypertension, recurrent UTI, bipolar disorder. Patient presented to the ED secondary to altered mental status as reported by her skilled nursing facility and her daughter. She was recently evaluated in the ED for a fall. She has had multiple falls in the past week. Nothing has been given to help with her mental status. Her daughter states that she is not as talkative or as mobile as she usually is.  Hospital Course:  Urinary tract infection -Patient has a history of pansensitive E. Coli recently and was on antibiotics -urine culture demonstrating >100,000 lactobacillus microorganism and blood cultures has remained neg -after 3 days of IV antibiotics and significant improvement clinically; will discharge on ceftin BID for another 5 days to complete treatment. -pyridium for dysuria as needed also prescribed -given findings  of erythematous genitalia and white discharge will also treat with diflucan for 3 days -patient advise to maintain adequate hydration   Altered mental status -Patient mentation is back to baseline at discharge -Does have a diagnosis of Parkinson Disease and maybe underlying dementia as well -B12 and TSH WNL -will complete treatment for her UTI   Parkinson disease -Possibly contributing to recurrent falls -continue carbidopa-levodopa -PT/OT to follow for rehab as per SNF protocol   Diabetes mellitus, insulin dependent -will discharge on adjusted home hypoglycemic regimen  -modified carb diet recommended   Hypertension -continue benazepril -BP stable and well controlled   Hyperlipidemia -continue atorvastatin  Constipation -continue miralax -continue Senakot-s  Bipolar disorder -continue lamictal -continue abilify  Overactive bladder -will resume oxybutynin at discharge -discussed with family, that even this improved incontinence, can be associated with UTI's  Obesity, Morbid -Body mass index is 41.25 kg/m.  -low calorie and healthy choices discussed with patient and family.   Procedures:  See below for x-ray reports   Consultations:  None   Discharge Exam: Vitals:   08/27/16 0630 08/27/16 1303  BP: (!) 152/75 (!) 142/57  Pulse: 80 91  Resp: 18 18  Temp: 98.8 F (37.1 C) 98.9 F (37.2 C)    General:  Afebrile, non toxic; multiple bruises in her face appreciated; left eyebrow laceration (healing properly). Patient is more awake today and answering questions/following commands and oriented X2 (per daughter essentially at baseline).   Cardiovascular:S1 and S2, no rubs or gallops  Respiratory: scattered rhonchi, no wheezing, no crackles   Abdomen: soft, NT, ND, positive BS  GU: redness and white discharge reported.  Musculoskeletal: bruises  in arms and legs (different healing stages); no cyanosis, trace edema bilaterally  appreciated   Discharge Instructions   Discharge Instructions    Diet - low sodium heart healthy    Complete by:  As directed    Discharge instructions    Complete by:  As directed    Take medications as prescribed Follow heart healthy diet Diflucan for 3 days Antibiotics for 5 more days Pyridium up to 3 times a day for dysuria symptoms. Repeat BMET in 5 days to follow electrolytes and renal function  Further care and physical therapy as per SNF protocol     Current Discharge Medication List    START taking these medications   Details  cefUROXime (CEFTIN) 500 MG tablet Take 1 tablet (500 mg total) by mouth 2 (two) times daily with a meal.    fluconazole (DIFLUCAN) 100 MG tablet Take 1 tablet (100 mg total) by mouth daily.    phenazopyridine (PYRIDIUM) 100 MG tablet Take 1 tablet (100 mg total) by mouth 3 (three) times daily as needed for pain.      CONTINUE these medications which have CHANGED   Details  insulin glargine (LANTUS) 100 UNIT/ML injection Inject 0.35 mLs (35 Units total) into the skin 2 (two) times daily. QAM and QHS      CONTINUE these medications which have NOT CHANGED   Details  acetaminophen (TYLENOL) 325 MG tablet Take 650 mg by mouth every 6 (six) hours as needed for mild pain.     ARIPiprazole (ABILIFY) 2 MG tablet Take 2 mg by mouth daily.    aspirin 81 MG EC tablet Take 81 mg by mouth daily. Swallow whole.    atorvastatin (LIPITOR) 10 MG tablet Take 10 mg by mouth daily.    benazepril (LOTENSIN) 20 MG tablet Take 20 mg by mouth daily.    bisacodyl (DULCOLAX) 10 MG suppository Place 10 mg rectally daily as needed for moderate constipation.    carbidopa-levodopa (PARCOPA) 25-100 MG disintegrating tablet Take 1 tablet by mouth 2 (two) times daily.     conjugated estrogens (PREMARIN) vaginal cream Place 1 Applicatorful vaginally daily. Apply 0.5mg  (pea-sized amount)  just inside the vaginal introitus with a finger-tip every night for two weeks and  then Monday, Wednesday and Friday nights. Qty: 30 g, Refills: 12   Associated Diagnoses: Vaginal atrophy    Cranberry 200 MG CAPS Take 400 mg by mouth daily. Take two 200-mg capsules = 400 mg    DULoxetine (CYMBALTA) 60 MG capsule Take 60 mg by mouth daily.    gabapentin (NEURONTIN) 100 MG capsule Take 200 mg by mouth 2 (two) times daily. Take two 100 mg capsules to = 200 mg PO BID    glimepiride (AMARYL) 4 MG tablet Take 4 mg by mouth daily with breakfast.    insulin aspart (NOVOLOG) 100 UNIT/ML injection Inject 8-12 Units into the skin 2 (two) times daily as needed for high blood sugar. IF CBG >200, GIVE NOVOLOG 8 UNITS SQ TWO TIMES DAILY AS NEEDED IF CBG >400, GIVE NOVOLOG 12 UNITS SQ TWO TIMES DAILY AS NEEDED    lamoTRIgine (LAMICTAL) 100 MG tablet Take 100 mg by mouth 2 (two) times daily.     loratadine (CLARITIN) 10 MG tablet Take 10 mg by mouth daily.     magnesium oxide (MAG-OX) 400 MG tablet Take 400 mg by mouth daily.    metoprolol tartrate (LOPRESSOR) 25 MG tablet Take 25 mg by mouth 2 (two) times daily. Take 25 mg by  mouth twice a day for HTN    METRONIDAZOLE, TOPICAL, (METROLOTION) 0.75 % LOTN Apply 1 application topically 2 (two) times daily. Starting 08/12/16. To face, for rosacea.    multivitamin-lutein (OCUVITE-LUTEIN) CAPS capsule Take 1 capsule by mouth daily.    oxybutynin (DITROPAN) 5 MG tablet Take 5 mg by mouth 3 (three) times daily.    pantoprazole (PROTONIX) 40 MG tablet Take 40 mg by mouth daily.     polyethylene glycol (MIRALAX / GLYCOLAX) packet Take 17 g by mouth 2 (two) times daily.    Propylene Glycol (SYSTANE BALANCE OP) Apply 1 drop to eye 2 (two) times daily. Both eyes    Protein (PROCEL PO) Take 1 scoop by mouth 2 (two) times daily.     sennosides-docusate sodium (SENOKOT-S) 8.6-50 MG tablet Take 2 tablets by mouth 2 (two) times daily.      STOP taking these medications     cephALEXin (KEFLEX) 500 MG capsule      ciprofloxacin (CIPRO) 500  MG tablet      loperamide (IMODIUM) 2 MG capsule      miconazole (MONISTAT 7) 2 % vaginal cream        Allergies  Allergen Reactions  . Codeine Itching  . Morphine And Related Itching  . Indocin [Indomethacin] Rash   Follow-up Information    Oneal GroutPANDEY, MAHIMA, MD. Schedule an appointment as soon as possible for a visit in 2 week(s).   Specialty:  Internal Medicine Contact information: [address] [place] [id number] [phone]           The results of significant diagnostics from this hospitalization (including imaging, microbiology, ancillary and laboratory) are listed below for reference.    Significant Diagnostic Studies: Dg Chest 2 View  Result Date: 08/24/2016 CLINICAL DATA:  Status post fall, with decreased O2 saturation. Initial encounter. EXAM: CHEST  2 VIEW COMPARISON:  Chest radiograph from 05/30/2014 FINDINGS: The lungs are hypoexpanded. Vascular congestion is noted. There is no evidence of focal opacification, pleural effusion or pneumothorax. The heart is mildly enlarged. No acute osseous abnormalities are seen. IMPRESSION: Lungs hypoexpanded. Vascular congestion and mild cardiomegaly. No displaced rib fractures identified. Electronically Signed   By: Roanna RaiderJeffery  Chang M.D.   On: 08/24/2016 01:03   Ct Head Wo Contrast  Result Date: 08/24/2016 CLINICAL DATA:  Status post fall with right-sided headache EXAM: CT HEAD WITHOUT CONTRAST CT MAXILLOFACIAL WITHOUT CONTRAST CT CERVICAL SPINE WITHOUT CONTRAST TECHNIQUE: Multidetector CT imaging of the head, cervical spine, and maxillofacial structures were performed using the standard protocol without intravenous contrast. Multiplanar CT image reconstructions of the cervical spine and maxillofacial structures were also generated. COMPARISON:  CT head and cervical spine 08/19/2016 FINDINGS: CT HEAD FINDINGS Brain: No mass lesion, intraparenchymal hemorrhage or extra-axial collection. No evidence of acute cortical infarct.  There is periventricular hypoattenuation compatible with chronic microvascular disease. Old right centrum semiovale lacunar infarct. Vascular: Mild carotid and vertebral artery calcification at the skullbase. Skull: No skull fracture identified. Medium-sized right frontal scalp hematoma/laceration. CT MAXILLOFACIAL FINDINGS Osseous: --Complex facial fracture types: No LeFort, zygomaticomaxillary complex or nasoorbitoethmoidal fracture. --Simple fracture types: None. --Mandible: No fracture or dislocation. Orbits: The globes appear intact. Normal appearance of the intra- and extraconal fat. Symmetric extraocular muscles. Sinuses: No fluid levels or advanced mucosal thickening. Soft tissues: Normal visualized extracranial facial soft tissues. CT CERVICAL SPINE FINDINGS Alignment: No acute static subluxation. Facets are aligned. Occipital condyles are normally positioned. Unchanged grade 1 C4-5 anterolisthesis. Skull base and vertebrae: No acute  fracture. Soft tissues and spinal canal: No prevertebral fluid or swelling. No visible canal hematoma. Disc levels: Multilevel facet hypertrophy and fusion at the C5-T1 levels. No osseous spinal canal stenosis. Upper chest: No pneumothorax, pulmonary nodule or pleural effusion. Other: There is a left level 3 lymph node measuring 1.1 cm, new from the prior study (series 8, image 42). Enlarged lymph nodes are also noted right level 2. IMPRESSION: 1. No acute intracranial abnormality. 2. Medium-sized right frontal scalp laceration/hematoma. No underlying skull fracture or facial fracture. 3. No acute fracture or static subluxation of the cervical spine. 4. New cervical lymphadenopathy compared to 08/19/2016, of uncertain significance. Electronically Signed   By: Deatra Robinson M.D.   On: 08/24/2016 00:57   Ct Head Wo Contrast  Result Date: 08/19/2016 CLINICAL DATA:  Fall EXAM: CT HEAD WITHOUT CONTRAST CT CERVICAL SPINE WITHOUT CONTRAST TECHNIQUE: Multidetector CT imaging of  the head and cervical spine was performed following the standard protocol without intravenous contrast. Multiplanar CT image reconstructions of the cervical spine were also generated. COMPARISON:  CT head and cervical spine 05/10/2016 FINDINGS: CT HEAD FINDINGS Brain: No mass lesion, intraparenchymal hemorrhage or extra-axial collection. No evidence of acute cortical infarct. There is periventricular hypoattenuation compatible with chronic microvascular disease. Vascular: No hyperdense vessel or unexpected calcification. Skull: Normal visualized skull base, calvarium and extracranial soft tissues. Sinuses/Orbits: No sinus fluid levels or advanced mucosal thickening. No mastoid effusion. Normal orbits. CT CERVICAL SPINE FINDINGS The lower cervical spine is severely degraded by motion the attenuation. Alignment: There is grade 1 anterolisthesis at C4-C5, unchanged. There is no acute static subluxation. The lateral masses of C1 and C2 in the occipital condyles are aligned. Skull base and vertebrae: No acute fracture. Soft tissues and spinal canal: No prevertebral fluid or swelling. No visible canal hematoma. Disc levels: There is fusion at the C5 -T1 levels. No osseous spinal canal stenosis. Multilevel facet hypertrophy. Upper chest: No pneumothorax, pulmonary nodule or pleural effusion. Other: Normal visualized paraspinal cervical soft tissues. IMPRESSION: 1. No acute intracranial abnormality. 2. No acute fracture or static subluxation of the cervical spine. 3. Findings of chronic microvascular ischemia. 4. Multilevel degenerative disc disease and facet arthrosis. Electronically Signed   By: Deatra Robinson M.D.   On: 08/19/2016 17:48   Ct Cervical Spine Wo Contrast  Result Date: 08/24/2016 CLINICAL DATA:  Status post fall with right-sided headache EXAM: CT HEAD WITHOUT CONTRAST CT MAXILLOFACIAL WITHOUT CONTRAST CT CERVICAL SPINE WITHOUT CONTRAST TECHNIQUE: Multidetector CT imaging of the head, cervical spine, and  maxillofacial structures were performed using the standard protocol without intravenous contrast. Multiplanar CT image reconstructions of the cervical spine and maxillofacial structures were also generated. COMPARISON:  CT head and cervical spine 08/19/2016 FINDINGS: CT HEAD FINDINGS Brain: No mass lesion, intraparenchymal hemorrhage or extra-axial collection. No evidence of acute cortical infarct. There is periventricular hypoattenuation compatible with chronic microvascular disease. Old right centrum semiovale lacunar infarct. Vascular: Mild carotid and vertebral artery calcification at the skullbase. Skull: No skull fracture identified. Medium-sized right frontal scalp hematoma/laceration. CT MAXILLOFACIAL FINDINGS Osseous: --Complex facial fracture types: No LeFort, zygomaticomaxillary complex or nasoorbitoethmoidal fracture. --Simple fracture types: None. --Mandible: No fracture or dislocation. Orbits: The globes appear intact. Normal appearance of the intra- and extraconal fat. Symmetric extraocular muscles. Sinuses: No fluid levels or advanced mucosal thickening. Soft tissues: Normal visualized extracranial facial soft tissues. CT CERVICAL SPINE FINDINGS Alignment: No acute static subluxation. Facets are aligned. Occipital condyles are normally positioned. Unchanged grade 1 C4-5 anterolisthesis. Skull  base and vertebrae: No acute fracture. Soft tissues and spinal canal: No prevertebral fluid or swelling. No visible canal hematoma. Disc levels: Multilevel facet hypertrophy and fusion at the C5-T1 levels. No osseous spinal canal stenosis. Upper chest: No pneumothorax, pulmonary nodule or pleural effusion. Other: There is a left level 3 lymph node measuring 1.1 cm, new from the prior study (series 8, image 42). Enlarged lymph nodes are also noted right level 2. IMPRESSION: 1. No acute intracranial abnormality. 2. Medium-sized right frontal scalp laceration/hematoma. No underlying skull fracture or facial  fracture. 3. No acute fracture or static subluxation of the cervical spine. 4. New cervical lymphadenopathy compared to 08/19/2016, of uncertain significance. Electronically Signed   By: Deatra RobinsonKevin  Herman M.D.   On: 08/24/2016 00:57   Ct Cervical Spine Wo Contrast  Result Date: 08/19/2016 CLINICAL DATA:  Fall EXAM: CT HEAD WITHOUT CONTRAST CT CERVICAL SPINE WITHOUT CONTRAST TECHNIQUE: Multidetector CT imaging of the head and cervical spine was performed following the standard protocol without intravenous contrast. Multiplanar CT image reconstructions of the cervical spine were also generated. COMPARISON:  CT head and cervical spine 05/10/2016 FINDINGS: CT HEAD FINDINGS Brain: No mass lesion, intraparenchymal hemorrhage or extra-axial collection. No evidence of acute cortical infarct. There is periventricular hypoattenuation compatible with chronic microvascular disease. Vascular: No hyperdense vessel or unexpected calcification. Skull: Normal visualized skull base, calvarium and extracranial soft tissues. Sinuses/Orbits: No sinus fluid levels or advanced mucosal thickening. No mastoid effusion. Normal orbits. CT CERVICAL SPINE FINDINGS The lower cervical spine is severely degraded by motion the attenuation. Alignment: There is grade 1 anterolisthesis at C4-C5, unchanged. There is no acute static subluxation. The lateral masses of C1 and C2 in the occipital condyles are aligned. Skull base and vertebrae: No acute fracture. Soft tissues and spinal canal: No prevertebral fluid or swelling. No visible canal hematoma. Disc levels: There is fusion at the C5 -T1 levels. No osseous spinal canal stenosis. Multilevel facet hypertrophy. Upper chest: No pneumothorax, pulmonary nodule or pleural effusion. Other: Normal visualized paraspinal cervical soft tissues. IMPRESSION: 1. No acute intracranial abnormality. 2. No acute fracture or static subluxation of the cervical spine. 3. Findings of chronic microvascular ischemia. 4.  Multilevel degenerative disc disease and facet arthrosis. Electronically Signed   By: Deatra RobinsonKevin  Herman M.D.   On: 08/19/2016 17:48   Ct Hip Right Wo Contrast  Result Date: 08/24/2016 CLINICAL DATA:  Status post fall, with right hip pain. Initial encounter. EXAM: CT OF THE RIGHT HIP WITHOUT CONTRAST TECHNIQUE: Multidetector CT imaging of the right hip was performed according to the standard protocol. Multiplanar CT image reconstructions were also generated. COMPARISON:  Pelvis radiograph performed 08/22/2016 FINDINGS: Bones/Joint/Cartilage There is no evidence of fracture or dislocation. The patient's right hip hemiarthroplasty is grossly unremarkable in appearance, without evidence of loosening. Mild underlying heterotopic bone formation is noted at the right acetabulum. Mild degenerative change is noted at the pubic symphysis. Ligaments Suboptimally assessed by CT. Muscles and Tendons The visualized musculature is grossly unremarkable. No tendon abnormalities are characterized. Soft tissues Visualized bowel loops are grossly unremarkable in appearance. The bladder is mildly distended and unremarkable in appearance. Scattered vascular calcifications are seen. IMPRESSION: 1. No evidence of fracture or dislocation. 2. Right hip arthroplasty is unremarkable in appearance, without evidence of loosening. 3. Scattered vascular calcifications seen. Electronically Signed   By: Roanna RaiderJeffery  Chang M.D.   On: 08/24/2016 01:00   Dg Chest Port 1 View  Result Date: 08/24/2016 CLINICAL DATA:  Fever with multiple falls last  week. EXAM: PORTABLE CHEST 1 VIEW COMPARISON:  08/24/2016 FINDINGS: Cardiomegaly. Low lung volumes with bibasilar atelectasis. No visible effusions or acute bony abnormality. IMPRESSION: Cardiomegaly.  Bibasilar atelectasis. Electronically Signed   By: Charlett Nose M.D.   On: 08/24/2016 12:54   Dg Hip Unilat W Or Wo Pelvis 2-3 Views Left  Result Date: 08/22/2016 CLINICAL DATA:  Fall at home, pain and  unable to ambulate EXAM: DG HIP (WITH OR WITHOUT PELVIS) 2-3V LEFT COMPARISON:  03/25/2015, 03/23/2013, 03/22/2013 FINDINGS: The patient is status post right hip replacement with no dislocation. Heterotopic bone formation about the right hip is unchanged. Pubic symphysis appears intact. Mild to moderate degenerative changes of the left hip. No acute fracture or dislocation. Heterotopic calcification along the proximal shaft of the left femur is radiographically stable. There are vascular calcifications. IMPRESSION: 1. No acute osseous abnormality. 2. Status post right hip replacement 3. Mild to moderate degenerative changes of the left hip Electronically Signed   By: Jasmine Pang M.D.   On: 08/22/2016 00:43   Ct Maxillofacial Wo Contrast  Result Date: 08/24/2016 CLINICAL DATA:  Status post fall with right-sided headache EXAM: CT HEAD WITHOUT CONTRAST CT MAXILLOFACIAL WITHOUT CONTRAST CT CERVICAL SPINE WITHOUT CONTRAST TECHNIQUE: Multidetector CT imaging of the head, cervical spine, and maxillofacial structures were performed using the standard protocol without intravenous contrast. Multiplanar CT image reconstructions of the cervical spine and maxillofacial structures were also generated. COMPARISON:  CT head and cervical spine 08/19/2016 FINDINGS: CT HEAD FINDINGS Brain: No mass lesion, intraparenchymal hemorrhage or extra-axial collection. No evidence of acute cortical infarct. There is periventricular hypoattenuation compatible with chronic microvascular disease. Old right centrum semiovale lacunar infarct. Vascular: Mild carotid and vertebral artery calcification at the skullbase. Skull: No skull fracture identified. Medium-sized right frontal scalp hematoma/laceration. CT MAXILLOFACIAL FINDINGS Osseous: --Complex facial fracture types: No LeFort, zygomaticomaxillary complex or nasoorbitoethmoidal fracture. --Simple fracture types: None. --Mandible: No fracture or dislocation. Orbits: The globes appear  intact. Normal appearance of the intra- and extraconal fat. Symmetric extraocular muscles. Sinuses: No fluid levels or advanced mucosal thickening. Soft tissues: Normal visualized extracranial facial soft tissues. CT CERVICAL SPINE FINDINGS Alignment: No acute static subluxation. Facets are aligned. Occipital condyles are normally positioned. Unchanged grade 1 C4-5 anterolisthesis. Skull base and vertebrae: No acute fracture. Soft tissues and spinal canal: No prevertebral fluid or swelling. No visible canal hematoma. Disc levels: Multilevel facet hypertrophy and fusion at the C5-T1 levels. No osseous spinal canal stenosis. Upper chest: No pneumothorax, pulmonary nodule or pleural effusion. Other: There is a left level 3 lymph node measuring 1.1 cm, new from the prior study (series 8, image 42). Enlarged lymph nodes are also noted right level 2. IMPRESSION: 1. No acute intracranial abnormality. 2. Medium-sized right frontal scalp laceration/hematoma. No underlying skull fracture or facial fracture. 3. No acute fracture or static subluxation of the cervical spine. 4. New cervical lymphadenopathy compared to 08/19/2016, of uncertain significance. Electronically Signed   By: Deatra Robinson M.D.   On: 08/24/2016 00:57    Microbiology: Recent Results (from the past 240 hour(s))  Urine culture     Status: Abnormal   Collection Time: 08/24/16  1:21 AM  Result Value Ref Range Status   Specimen Description URINE, CLEAN CATCH  Final   Special Requests NONE  Final   Culture >=100,000 COLONIES/mL LACTOBACILLUS SPECIES (A)  Final   Report Status 08/25/2016 FINAL  Final  Blood Culture (routine x 2)     Status: None (Preliminary result)  Collection Time: 08/24/16 12:56 PM  Result Value Ref Range Status   Specimen Description BLOOD LEFT HAND  Final   Special Requests IN PEDIATRIC BOTTLE 3 CC  Final   Culture   Final    NO GROWTH 2 DAYS Performed at [facility]    Report Status PENDING  Incomplete  Blood  Culture (routine x 2)     Status: None (Preliminary result)   Collection Time: 08/24/16  1:06 PM  Result Value Ref Range Status   Specimen Description BLOOD RIGHT ANTECUBITAL  Final   Special Requests BOTTLES DRAWN AEROBIC AND ANAEROBIC 5 CC EA  Final   Culture   Final    NO GROWTH 2 DAYS Performed at [facility]    Report Status PENDING  Incomplete  Urine culture     Status: None   Collection Time: 08/24/16  2:05 PM  Result Value Ref Range Status   Specimen Description URINE, CATHETERIZED  Final   Special Requests NONE  Final   Culture NO GROWTH Performed at [facility]   Final   Report Status 08/25/2016 FINAL  Final  MRSA PCR Screening     Status: None   Collection Time: 08/25/16  3:13 AM  Result Value Ref Range Status   MRSA by PCR NEGATIVE NEGATIVE Final    Comment:        The GeneXpert MRSA Assay (FDA approved for NASAL specimens only), is one component of a comprehensive MRSA colonization surveillance program. It is not intended to diagnose MRSA infection nor to guide or monitor treatment for MRSA infections.      Labs: Basic Metabolic Panel:  Recent Labs Lab 08/24/16 0102 08/24/16 1305 08/25/16 0442 08/27/16 0457  NA 136 135 137 137  K 4.2 4.0 4.6 3.8  CL 101 102 102 101  CO2 26 24 27 28   GLUCOSE 289* 205* 140* 159*  BUN 17 14 12 19   CREATININE 1.01* 0.80 0.91 0.89  CALCIUM 9.2 8.4* 8.3* 8.5*   Liver Function Tests:  Recent Labs Lab 08/24/16 1305  AST 22  ALT 9*  ALKPHOS 91  BILITOT 0.7  PROT 6.7  ALBUMIN 3.1*   CBC:  Recent Labs Lab 08/24/16 0102 08/24/16 1305 08/25/16 0442 08/27/16 0457  WBC 11.3* 11.0* 12.8* 9.9  NEUTROABS 8.0* 8.0*  --   --   HGB 13.4 13.1 14.1 13.0  HCT 40.2 39.6 41.0 39.4  MCV 91.4 90.8 89.5 91.2  PLT 235 227 180 231   Cardiac Enzymes:  Recent Labs Lab 08/24/16 1305  TROPONINI <0.03   CBG:  Recent Labs Lab 08/26/16 1129 08/26/16 1700 08/26/16 2219 08/27/16 0805 08/27/16 1159   GLUCAP 180* 209* 180* 149* 175*    Signed:  Vassie Loll MD.  [facility] 08/27/2016, 1:13 PM

## 2016-08-29 ENCOUNTER — Telehealth: Payer: Self-pay

## 2016-08-29 LAB — CULTURE, BLOOD (ROUTINE X 2)
CULTURE: NO GROWTH
Culture: NO GROWTH

## 2016-08-29 LAB — HEPATIC FUNCTION PANEL
ALK PHOS: 90 U/L (ref 25–125)
ALT: 16 U/L (ref 7–35)
AST: 24 U/L (ref 13–35)
Bilirubin, Total: 0.4 mg/dL

## 2016-08-29 LAB — BASIC METABOLIC PANEL
BUN: 30 mg/dL — AB (ref 4–21)
CREATININE: 1.1 mg/dL (ref 0.5–1.1)
Glucose: 178 mg/dL
Potassium: 3.7 mmol/L (ref 3.4–5.3)
Sodium: 145 mmol/L (ref 137–147)

## 2016-08-29 LAB — CBC AND DIFFERENTIAL
HCT: 41 % (ref 36–46)
HEMOGLOBIN: 13.3 g/dL (ref 12.0–16.0)
PLATELETS: 308 10*3/uL (ref 150–399)
WBC: 11.6 10*3/mL

## 2016-08-29 NOTE — Telephone Encounter (Signed)
Possible re-admission to facility. [facility] F/U is needed if patient was re-admitted to facility upon discharge. Hospital discharge from [facility] on 08/28/16.

## 2016-08-30 ENCOUNTER — Non-Acute Institutional Stay (SKILLED_NURSING_FACILITY): Payer: Medicare Other | Admitting: Internal Medicine

## 2016-08-30 ENCOUNTER — Encounter: Payer: Self-pay | Admitting: Internal Medicine

## 2016-08-30 DIAGNOSIS — B9620 Unspecified Escherichia coli [E. coli] as the cause of diseases classified elsewhere: Secondary | ICD-10-CM

## 2016-08-30 DIAGNOSIS — N3281 Overactive bladder: Secondary | ICD-10-CM

## 2016-08-30 DIAGNOSIS — R531 Weakness: Secondary | ICD-10-CM

## 2016-08-30 DIAGNOSIS — N390 Urinary tract infection, site not specified: Secondary | ICD-10-CM | POA: Diagnosis not present

## 2016-08-30 DIAGNOSIS — F329 Major depressive disorder, single episode, unspecified: Secondary | ICD-10-CM | POA: Diagnosis not present

## 2016-08-30 DIAGNOSIS — B372 Candidiasis of skin and nail: Secondary | ICD-10-CM

## 2016-08-30 DIAGNOSIS — K5900 Constipation, unspecified: Secondary | ICD-10-CM | POA: Diagnosis not present

## 2016-08-30 DIAGNOSIS — E1142 Type 2 diabetes mellitus with diabetic polyneuropathy: Secondary | ICD-10-CM | POA: Diagnosis not present

## 2016-08-30 DIAGNOSIS — S0181XS Laceration without foreign body of other part of head, sequela: Secondary | ICD-10-CM

## 2016-08-30 DIAGNOSIS — I10 Essential (primary) hypertension: Secondary | ICD-10-CM | POA: Diagnosis not present

## 2016-08-30 DIAGNOSIS — Z794 Long term (current) use of insulin: Secondary | ICD-10-CM

## 2016-08-30 DIAGNOSIS — G20 Parkinson's disease: Secondary | ICD-10-CM

## 2016-08-30 DIAGNOSIS — G629 Polyneuropathy, unspecified: Secondary | ICD-10-CM | POA: Diagnosis not present

## 2016-08-30 DIAGNOSIS — L22 Diaper dermatitis: Secondary | ICD-10-CM

## 2016-08-30 DIAGNOSIS — F0390 Unspecified dementia without behavioral disturbance: Secondary | ICD-10-CM | POA: Diagnosis not present

## 2016-08-30 DIAGNOSIS — F3130 Bipolar disorder, current episode depressed, mild or moderate severity, unspecified: Secondary | ICD-10-CM | POA: Diagnosis not present

## 2016-08-30 NOTE — Progress Notes (Signed)
LOCATION: [facility]  PCP: Oneal GroutPANDEY, Toshiro Hanken, MD   Code Status: Full Code  Goals of care: Advanced Directive information Advanced Directives 08/24/2016  Does Patient Have a Medical Advance Directive? No  Would patient like information on creating a medical advance directive? No - Patient declined       Extended Emergency Contact Information Primary Emergency Contact: [facility],[name] Address: [address]          [place], [place] [address] [place] Home Phone: [phone] Relation: Daughter Secondary Emergency Contact: Kerr,Jennifer L Address: [address]          [place], [place] [address] [place] Home Phone: [phone] Mobile Phone: [phone] Relation: Daughter   Allergies  Allergen Reactions  . Codeine Itching  . Morphine And Related Itching  . Indocin [Indomethacin] Rash    Chief Complaint  Patient presents with  . Readmit To SNF    Readmission Visit     HPI:  Patient is a 81 y.o. female seen today for Long-term care post hospital readmission from 08/24/2016-08/27/2016 with altered mental status. She was found to have Escherichia coli urinary tract infection. She was started on IV antibiotics and later switched to oral antibiotic at time of discharge. She was also started on antifungal treatment for candidiasis. Of note, patient was having frequent falls prior to this hospitalization She has medical history of diabetes mellitus, hypertension, recurrent urinary tract infection, Parkinson's disease among others. She is seen in her room today   Review of Systems:  Constitutional: Negative for fever, chills, diaphoresis.  HENT: Negative for headache, congestion, nasal discharge Eyes: Negative for blurred vision, double vision and discharge.  Respiratory: Negative for cough, shortness of breath and wheezing.   Cardiovascular: Negative for chest pain, palpitation, leg swelling.  Gastrointestinal: Negative for heartburn, nausea, vomiting,  abdominal pain. Last bowel movement was 2 days ago. Genitourinary: Negative for  flank pain. Positive for dysuria.   Musculoskeletal: Negative for back pain, fall in the facility.  Skin: Negative for itching, rash.  Neurological: Negative for dizziness. Positive for weakness. Psychiatric/Behavioral: Negative for depression   Past Medical History:  Diagnosis Date  . Allergic rhinitis   . Ascorbic acid deficiency   . Bipolar affective disorder (HCC)   . Candidiasis   . Charcot's joint   . Chronic constipation   . Dysuria   . GERD (gastroesophageal reflux disease)   . History of falling   . HLD (hyperlipidemia)   . HTN (hypertension)   . Magnesium deficiency   . Major depressive disorder   . Mixed incontinence   . Muscle weakness   . Neuropathic pain   . Neuropathy (HCC)   . OAB (overactive bladder)   . Obesity   . Oral pain   . Parkinson disease (HCC)   . Pneumonia   . Protein calorie malnutrition (HCC)   . Rosacea   . Slow transit constipation   . Type 2 diabetes mellitus with diabetic polyneuropathy, with long-term current use of insulin (HCC)   . Urinary tract infection without hematuria   . Vaginal atrophy   . Vitamin deficiency    Past Surgical History:  Procedure Laterality Date  . ABDOMINAL HYSTERECTOMY    . CERVICAL CONIZATION W/BX    . CHOLECYSTECTOMY    . REVISION TOTAL KNEE ARTHROPLASTY Right    Social History:   reports that she has never smoked. She has never used smokeless tobacco. She reports that she does not drink alcohol or use drugs.  Family History  Problem Relation Age of Onset  . Colon cancer Father   . Colon cancer Sister   . Stomach cancer Sister   . Rectal cancer Sister   . Kidney disease Neg Hx   . Bladder Cancer Neg Hx   . Prostate cancer Neg Hx     Medications:   Medication List       Accurate as of 08/30/16 12:39 PM. Always use your most recent med list.          acetaminophen 325 MG tablet Commonly known as:   TYLENOL Take 650 mg by mouth every 6 (six) hours as needed for mild pain.   ACIDOPHILUS PO Take 1 capsule by mouth daily. Stop date 09/02/16   ARIPiprazole 2 MG tablet Commonly known as:  ABILIFY Take 2 mg by mouth daily.   aspirin 81 MG EC tablet Take 81 mg by mouth daily. Swallow whole.   atorvastatin 10 MG tablet Commonly known as:  LIPITOR Take 10 mg by mouth daily.   bisacodyl 10 MG suppository Commonly known as:  DULCOLAX Place 10 mg rectally daily as needed for moderate constipation.   carbidopa-levodopa 25-100 MG disintegrating tablet Commonly known as:  PARCOPA Take 1 tablet by mouth 2 (two) times daily.   cefUROXime 500 MG tablet Commonly known as:  CEFTIN Take 1 tablet (500 mg total) by mouth 2 (two) times daily with a meal.   conjugated estrogens vaginal cream Commonly known as:  PREMARIN Place 1 Applicatorful vaginally daily. Apply 0.5mg  (pea-sized amount)  just inside the vaginal introitus with a finger-tip every night for two weeks and then Monday, Wednesday and Friday nights.   Cranberry 200 MG Caps Take 400 mg by mouth daily. Take two 200-mg capsules = 400 mg   DULoxetine 60 MG capsule Commonly known as:  CYMBALTA Take 60 mg by mouth daily.   fluconazole 100 MG tablet Commonly known as:  DIFLUCAN Take 1 tablet (100 mg total) by mouth daily.   gabapentin 100 MG capsule Commonly known as:  NEURONTIN Take 200 mg by mouth 2 (two) times daily. Take two 100 mg capsules to = 200 mg PO BID   glimepiride 4 MG tablet Commonly known as:  AMARYL Take 4 mg by mouth daily with breakfast.   insulin aspart 100 UNIT/ML injection Commonly known as:  novoLOG Inject 8-12 Units into the skin 2 (two) times daily as needed for high blood sugar. IF CBG >200, GIVE NOVOLOG 8 UNITS SQ TWO TIMES DAILY AS NEEDED IF CBG >400, GIVE NOVOLOG 12 UNITS SQ TWO TIMES DAILY AS NEEDED   insulin glargine 100 UNIT/ML injection Commonly known as:  LANTUS Inject 0.35 mLs (35 Units  total) into the skin 2 (two) times daily. QAM and QHS   lamoTRIgine 100 MG tablet Commonly known as:  LAMICTAL Take 100 mg by mouth 2 (two) times daily.   lisinopril 20 MG tablet Commonly known as:  PRINIVIL,ZESTRIL Take 20 mg by mouth daily.   loratadine 10 MG tablet Commonly known as:  CLARITIN Take 10 mg by mouth daily.   magnesium oxide 400 MG tablet Commonly known as:  MAG-OX Take 400 mg by mouth daily.   metoprolol tartrate 25 MG tablet Commonly known as:  LOPRESSOR Take 25 mg by mouth 2 (two) times daily. Take 25 mg by mouth twice a day for HTN   METROLOTION 0.75 % Lotn Generic drug:  METRONIDAZOLE (TOPICAL) Apply 1 application topically 2 (two) times daily. Starting 08/12/16. To face, for  rosacea.   multivitamin-lutein Caps capsule Take 1 capsule by mouth daily.   oxybutynin 5 MG tablet Commonly known as:  DITROPAN Take 5 mg by mouth 3 (three) times daily.   pantoprazole 40 MG tablet Commonly known as:  PROTONIX Take 40 mg by mouth daily.   phenazopyridine 100 MG tablet Commonly known as:  PYRIDIUM Take 1 tablet (100 mg total) by mouth 3 (three) times daily as needed for pain.   polyethylene glycol packet Commonly known as:  MIRALAX / GLYCOLAX Take 17 g by mouth 2 (two) times daily.   PROCEL PO Take 1 scoop by mouth 2 (two) times daily.   sennosides-docusate sodium 8.6-50 MG tablet Commonly known as:  SENOKOT-S Take 2 tablets by mouth 2 (two) times daily.   SYSTANE BALANCE OP Apply 1 drop to eye 2 (two) times daily. Both eyes       Immunizations: Immunization History  Administered Date(s) Administered  . Influenza-Unspecified 06/20/2016  . PPD Test 01/16/2015  . Tdap 08/19/2016     Physical Exam: Vitals:   08/30/16 1230  BP: (!) 161/94  Pulse: 96  Resp: 20  Temp: 97 F (36.1 C)  TempSrc: Oral  SpO2: 96%  Weight: 244 lb (110.7 kg)  Height: 5\' 6"  (1.676 m)   Body mass index is 39.38 kg/m.  General- elderly female, Obese, in no  acute distress Head- normocephalic, atraumatic Throat- moist mucus membrane Eyes- PERRLA, EOMI, no pallor, no icterus, no discharge, normal conjunctiva, normal sclera Neck- no cervical lymphadenopathy Cardiovascular- normal s1,s2, no murmur Respiratory- bilateral clear to auscultation, no wheeze, no rhonchi, no crackles, no use of accessory muscles Abdomen- bowel sounds present, soft, non tender, no guarding or rigidity, no CVA tenderness Musculoskeletal- able to move all 4 extremities, on wheelchair, trace leg edema, lower extremity weakness  Neurological- alert and oriented to person, place and time Skin- warm and dry, laceration to left forehead with sutures removed, bruises around both eyes that are resolving  Psychiatry- has flat affect   Labs reviewed: Basic Metabolic Panel:  Recent Labs  86/57/84 1305 08/25/16 0442 08/27/16 0457  NA 135 137 137  K 4.0 4.6 3.8  CL 102 102 101  CO2 24 27 28   GLUCOSE 205* 140* 159*  BUN 14 12 19   CREATININE 0.80 0.91 0.89  CALCIUM 8.4* 8.3* 8.5*   Liver Function Tests:  Recent Labs  03/10/16 07/04/16 08/24/16 1305  AST 11* 11* 22  ALT 14 12 9*  ALKPHOS 109 109 91  BILITOT  --   --  0.7  PROT  --   --  6.7  ALBUMIN  --   --  3.1*   No results for input(s): LIPASE, AMYLASE in the last 8760 hours. No results for input(s): AMMONIA in the last 8760 hours. CBC:  Recent Labs  03/10/16  08/24/16 0102 08/24/16 1305 08/25/16 0442 08/27/16 0457  WBC 9.6  < > 11.3* 11.0* 12.8* 9.9  NEUTROABS 5  --  8.0* 8.0*  --   --   HGB 12.0  --  13.4 13.1 14.1 13.0  HCT 37  --  40.2 39.6 41.0 39.4  MCV  --   < > 91.4 90.8 89.5 91.2  PLT 273  --  235 227 180 231  < > = values in this interval not displayed. Cardiac Enzymes:  Recent Labs  08/24/16 1305  TROPONINI <0.03   BNP: Invalid input(s): POCBNP CBG:  Recent Labs  08/26/16 2219 08/27/16 0805 08/27/16 1159  GLUCAP 180*  149* 175*    Radiological Exams: Dg Chest 2  View  Result Date: 08/24/2016 CLINICAL DATA:  Status post fall, with decreased O2 saturation. Initial encounter. EXAM: CHEST  2 VIEW COMPARISON:  Chest radiograph from 05/30/2014 FINDINGS: The lungs are hypoexpanded. Vascular congestion is noted. There is no evidence of focal opacification, pleural effusion or pneumothorax. The heart is mildly enlarged. No acute osseous abnormalities are seen. IMPRESSION: Lungs hypoexpanded. Vascular congestion and mild cardiomegaly. No displaced rib fractures identified. Electronically Signed   By: Roanna Raider M.D.   On: 08/24/2016 01:03   Ct Head Wo Contrast  Result Date: 08/24/2016 CLINICAL DATA:  Status post fall with right-sided headache EXAM: CT HEAD WITHOUT CONTRAST CT MAXILLOFACIAL WITHOUT CONTRAST CT CERVICAL SPINE WITHOUT CONTRAST TECHNIQUE: Multidetector CT imaging of the head, cervical spine, and maxillofacial structures were performed using the standard protocol without intravenous contrast. Multiplanar CT image reconstructions of the cervical spine and maxillofacial structures were also generated. COMPARISON:  CT head and cervical spine 08/19/2016 FINDINGS: CT HEAD FINDINGS Brain: No mass lesion, intraparenchymal hemorrhage or extra-axial collection. No evidence of acute cortical infarct. There is periventricular hypoattenuation compatible with chronic microvascular disease. Old right centrum semiovale lacunar infarct. Vascular: Mild carotid and vertebral artery calcification at the skullbase. Skull: No skull fracture identified. Medium-sized right frontal scalp hematoma/laceration. CT MAXILLOFACIAL FINDINGS Osseous: --Complex facial fracture types: No LeFort, zygomaticomaxillary complex or nasoorbitoethmoidal fracture. --Simple fracture types: None. --Mandible: No fracture or dislocation. Orbits: The globes appear intact. Normal appearance of the intra- and extraconal fat. Symmetric extraocular muscles. Sinuses: No fluid levels or advanced mucosal  thickening. Soft tissues: Normal visualized extracranial facial soft tissues. CT CERVICAL SPINE FINDINGS Alignment: No acute static subluxation. Facets are aligned. Occipital condyles are normally positioned. Unchanged grade 1 C4-5 anterolisthesis. Skull base and vertebrae: No acute fracture. Soft tissues and spinal canal: No prevertebral fluid or swelling. No visible canal hematoma. Disc levels: Multilevel facet hypertrophy and fusion at the C5-T1 levels. No osseous spinal canal stenosis. Upper chest: No pneumothorax, pulmonary nodule or pleural effusion. Other: There is a left level 3 lymph node measuring 1.1 cm, new from the prior study (series 8, image 42). Enlarged lymph nodes are also noted right level 2. IMPRESSION: 1. No acute intracranial abnormality. 2. Medium-sized right frontal scalp laceration/hematoma. No underlying skull fracture or facial fracture. 3. No acute fracture or static subluxation of the cervical spine. 4. New cervical lymphadenopathy compared to 08/19/2016, of uncertain significance. Electronically Signed   By: Deatra Robinson M.D.   On: 08/24/2016 00:57   Ct Head Wo Contrast  Result Date: 08/19/2016 CLINICAL DATA:  Fall EXAM: CT HEAD WITHOUT CONTRAST CT CERVICAL SPINE WITHOUT CONTRAST TECHNIQUE: Multidetector CT imaging of the head and cervical spine was performed following the standard protocol without intravenous contrast. Multiplanar CT image reconstructions of the cervical spine were also generated. COMPARISON:  CT head and cervical spine 05/10/2016 FINDINGS: CT HEAD FINDINGS Brain: No mass lesion, intraparenchymal hemorrhage or extra-axial collection. No evidence of acute cortical infarct. There is periventricular hypoattenuation compatible with chronic microvascular disease. Vascular: No hyperdense vessel or unexpected calcification. Skull: Normal visualized skull base, calvarium and extracranial soft tissues. Sinuses/Orbits: No sinus fluid levels or advanced mucosal thickening.  No mastoid effusion. Normal orbits. CT CERVICAL SPINE FINDINGS The lower cervical spine is severely degraded by motion the attenuation. Alignment: There is grade 1 anterolisthesis at C4-C5, unchanged. There is no acute static subluxation. The lateral masses of C1 and C2 in the occipital condyles are  aligned. Skull base and vertebrae: No acute fracture. Soft tissues and spinal canal: No prevertebral fluid or swelling. No visible canal hematoma. Disc levels: There is fusion at the C5 -T1 levels. No osseous spinal canal stenosis. Multilevel facet hypertrophy. Upper chest: No pneumothorax, pulmonary nodule or pleural effusion. Other: Normal visualized paraspinal cervical soft tissues. IMPRESSION: 1. No acute intracranial abnormality. 2. No acute fracture or static subluxation of the cervical spine. 3. Findings of chronic microvascular ischemia. 4. Multilevel degenerative disc disease and facet arthrosis. Electronically Signed   By: Deatra Robinson M.D.   On: 08/19/2016 17:48   Ct Cervical Spine Wo Contrast  Result Date: 08/24/2016 CLINICAL DATA:  Status post fall with right-sided headache EXAM: CT HEAD WITHOUT CONTRAST CT MAXILLOFACIAL WITHOUT CONTRAST CT CERVICAL SPINE WITHOUT CONTRAST TECHNIQUE: Multidetector CT imaging of the head, cervical spine, and maxillofacial structures were performed using the standard protocol without intravenous contrast. Multiplanar CT image reconstructions of the cervical spine and maxillofacial structures were also generated. COMPARISON:  CT head and cervical spine 08/19/2016 FINDINGS: CT HEAD FINDINGS Brain: No mass lesion, intraparenchymal hemorrhage or extra-axial collection. No evidence of acute cortical infarct. There is periventricular hypoattenuation compatible with chronic microvascular disease. Old right centrum semiovale lacunar infarct. Vascular: Mild carotid and vertebral artery calcification at the skullbase. Skull: No skull fracture identified. Medium-sized right frontal  scalp hematoma/laceration. CT MAXILLOFACIAL FINDINGS Osseous: --Complex facial fracture types: No LeFort, zygomaticomaxillary complex or nasoorbitoethmoidal fracture. --Simple fracture types: None. --Mandible: No fracture or dislocation. Orbits: The globes appear intact. Normal appearance of the intra- and extraconal fat. Symmetric extraocular muscles. Sinuses: No fluid levels or advanced mucosal thickening. Soft tissues: Normal visualized extracranial facial soft tissues. CT CERVICAL SPINE FINDINGS Alignment: No acute static subluxation. Facets are aligned. Occipital condyles are normally positioned. Unchanged grade 1 C4-5 anterolisthesis. Skull base and vertebrae: No acute fracture. Soft tissues and spinal canal: No prevertebral fluid or swelling. No visible canal hematoma. Disc levels: Multilevel facet hypertrophy and fusion at the C5-T1 levels. No osseous spinal canal stenosis. Upper chest: No pneumothorax, pulmonary nodule or pleural effusion. Other: There is a left level 3 lymph node measuring 1.1 cm, new from the prior study (series 8, image 42). Enlarged lymph nodes are also noted right level 2. IMPRESSION: 1. No acute intracranial abnormality. 2. Medium-sized right frontal scalp laceration/hematoma. No underlying skull fracture or facial fracture. 3. No acute fracture or static subluxation of the cervical spine. 4. New cervical lymphadenopathy compared to 08/19/2016, of uncertain significance. Electronically Signed   By: Deatra Robinson M.D.   On: 08/24/2016 00:57   Ct Cervical Spine Wo Contrast  Result Date: 08/19/2016 CLINICAL DATA:  Fall EXAM: CT HEAD WITHOUT CONTRAST CT CERVICAL SPINE WITHOUT CONTRAST TECHNIQUE: Multidetector CT imaging of the head and cervical spine was performed following the standard protocol without intravenous contrast. Multiplanar CT image reconstructions of the cervical spine were also generated. COMPARISON:  CT head and cervical spine 05/10/2016 FINDINGS: CT HEAD FINDINGS  Brain: No mass lesion, intraparenchymal hemorrhage or extra-axial collection. No evidence of acute cortical infarct. There is periventricular hypoattenuation compatible with chronic microvascular disease. Vascular: No hyperdense vessel or unexpected calcification. Skull: Normal visualized skull base, calvarium and extracranial soft tissues. Sinuses/Orbits: No sinus fluid levels or advanced mucosal thickening. No mastoid effusion. Normal orbits. CT CERVICAL SPINE FINDINGS The lower cervical spine is severely degraded by motion the attenuation. Alignment: There is grade 1 anterolisthesis at C4-C5, unchanged. There is no acute static subluxation. The lateral masses of C1 and  C2 in the occipital condyles are aligned. Skull base and vertebrae: No acute fracture. Soft tissues and spinal canal: No prevertebral fluid or swelling. No visible canal hematoma. Disc levels: There is fusion at the C5 -T1 levels. No osseous spinal canal stenosis. Multilevel facet hypertrophy. Upper chest: No pneumothorax, pulmonary nodule or pleural effusion. Other: Normal visualized paraspinal cervical soft tissues. IMPRESSION: 1. No acute intracranial abnormality. 2. No acute fracture or static subluxation of the cervical spine. 3. Findings of chronic microvascular ischemia. 4. Multilevel degenerative disc disease and facet arthrosis. Electronically Signed   By: Deatra Robinson M.D.   On: 08/19/2016 17:48   Ct Hip Right Wo Contrast  Result Date: 08/24/2016 CLINICAL DATA:  Status post fall, with right hip pain. Initial encounter. EXAM: CT OF THE RIGHT HIP WITHOUT CONTRAST TECHNIQUE: Multidetector CT imaging of the right hip was performed according to the standard protocol. Multiplanar CT image reconstructions were also generated. COMPARISON:  Pelvis radiograph performed 08/22/2016 FINDINGS: Bones/Joint/Cartilage There is no evidence of fracture or dislocation. The patient's right hip hemiarthroplasty is grossly unremarkable in appearance,  without evidence of loosening. Mild underlying heterotopic bone formation is noted at the right acetabulum. Mild degenerative change is noted at the pubic symphysis. Ligaments Suboptimally assessed by CT. Muscles and Tendons The visualized musculature is grossly unremarkable. No tendon abnormalities are characterized. Soft tissues Visualized bowel loops are grossly unremarkable in appearance. The bladder is mildly distended and unremarkable in appearance. Scattered vascular calcifications are seen. IMPRESSION: 1. No evidence of fracture or dislocation. 2. Right hip arthroplasty is unremarkable in appearance, without evidence of loosening. 3. Scattered vascular calcifications seen. Electronically Signed   By: Roanna Raider M.D.   On: 08/24/2016 01:00   Dg Chest Port 1 View  Result Date: 08/24/2016 CLINICAL DATA:  Fever with multiple falls last week. EXAM: PORTABLE CHEST 1 VIEW COMPARISON:  08/24/2016 FINDINGS: Cardiomegaly. Low lung volumes with bibasilar atelectasis. No visible effusions or acute bony abnormality. IMPRESSION: Cardiomegaly.  Bibasilar atelectasis. Electronically Signed   By: Charlett Nose M.D.   On: 08/24/2016 12:54   Dg Hip Unilat W Or Wo Pelvis 2-3 Views Left  Result Date: 08/22/2016 CLINICAL DATA:  Fall at home, pain and unable to ambulate EXAM: DG HIP (WITH OR WITHOUT PELVIS) 2-3V LEFT COMPARISON:  03/25/2015, 03/23/2013, 03/22/2013 FINDINGS: The patient is status post right hip replacement with no dislocation. Heterotopic bone formation about the right hip is unchanged. Pubic symphysis appears intact. Mild to moderate degenerative changes of the left hip. No acute fracture or dislocation. Heterotopic calcification along the proximal shaft of the left femur is radiographically stable. There are vascular calcifications. IMPRESSION: 1. No acute osseous abnormality. 2. Status post right hip replacement 3. Mild to moderate degenerative changes of the left hip Electronically Signed   By: Jasmine Pang M.D.   On: 08/22/2016 00:43   Ct Maxillofacial Wo Contrast  Result Date: 08/24/2016 CLINICAL DATA:  Status post fall with right-sided headache EXAM: CT HEAD WITHOUT CONTRAST CT MAXILLOFACIAL WITHOUT CONTRAST CT CERVICAL SPINE WITHOUT CONTRAST TECHNIQUE: Multidetector CT imaging of the head, cervical spine, and maxillofacial structures were performed using the standard protocol without intravenous contrast. Multiplanar CT image reconstructions of the cervical spine and maxillofacial structures were also generated. COMPARISON:  CT head and cervical spine 08/19/2016 FINDINGS: CT HEAD FINDINGS Brain: No mass lesion, intraparenchymal hemorrhage or extra-axial collection. No evidence of acute cortical infarct. There is periventricular hypoattenuation compatible with chronic microvascular disease. Old right centrum semiovale lacunar infarct. Vascular:  Mild carotid and vertebral artery calcification at the skullbase. Skull: No skull fracture identified. Medium-sized right frontal scalp hematoma/laceration. CT MAXILLOFACIAL FINDINGS Osseous: --Complex facial fracture types: No LeFort, zygomaticomaxillary complex or nasoorbitoethmoidal fracture. --Simple fracture types: None. --Mandible: No fracture or dislocation. Orbits: The globes appear intact. Normal appearance of the intra- and extraconal fat. Symmetric extraocular muscles. Sinuses: No fluid levels or advanced mucosal thickening. Soft tissues: Normal visualized extracranial facial soft tissues. CT CERVICAL SPINE FINDINGS Alignment: No acute static subluxation. Facets are aligned. Occipital condyles are normally positioned. Unchanged grade 1 C4-5 anterolisthesis. Skull base and vertebrae: No acute fracture. Soft tissues and spinal canal: No prevertebral fluid or swelling. No visible canal hematoma. Disc levels: Multilevel facet hypertrophy and fusion at the C5-T1 levels. No osseous spinal canal stenosis. Upper chest: No pneumothorax, pulmonary nodule or  pleural effusion. Other: There is a left level 3 lymph node measuring 1.1 cm, new from the prior study (series 8, image 42). Enlarged lymph nodes are also noted right level 2. IMPRESSION: 1. No acute intracranial abnormality. 2. Medium-sized right frontal scalp laceration/hematoma. No underlying skull fracture or facial fracture. 3. No acute fracture or static subluxation of the cervical spine. 4. New cervical lymphadenopathy compared to 08/19/2016, of uncertain significance. Electronically Signed   By: Deatra RobinsonKevin  Herman M.D.   On: 08/24/2016 00:57    Assessment/Plan  Generalized weakness From recent urinary tract infection. Will have her be evaluated by physical therapy and occupational therapy team to help work on regaining her strength. Fall precautions to be in place.  Escherichia coli urinary tract infection Continue and complete her course of cefuroxime in 500 mg twice a day on 09/01/2016. Hydration encouraged.  Candidiasis Continue and complete course of Diflucan 100 mg daily today. Perineal hygiene to be maintained.  Left forehead laceration Had sutured in place in the hospital. The sutures were removed yesterday and this appears to be healing well. Monitor  Hypertension Monitor blood pressure and heart rate every shift for 1 week for now. Continue metoprolol tartrate 25 mg twice a day, lisinopril 20 mg daily.  Diabetes mellitus type 2 with neuropathy Hemoglobin A1c reviewed. Continue Lantus 35 units twice a day and glimepiride 4 mg daily with sliding scale insulin Humalog with meals twice a day. Monitor blood sugar reading. Continue gabapentin but does adjustments made as below. Continue aspirin and statin. Lab Results  Component Value Date   HGBA1C 9.8 07/04/2016   Parkinson's disease Continue her carbidopa-levodopa twice a day.  Neuropathy pain Has Charcot joints disease. Currently on gabapentin 200 mg twice a day. Pain has been under control. Given her recent altered mental  status and hospitalization, change her gabapentin to 200 mg only at bedtime for now and monitor.  Chronic constipation Continue MiraLAX twice a day and Senokot-S 2 tablets twice a day for now. Hydration to be maintained. Also on Dulcolax suppository on a daily basis as needed.  Overactive bladder Currently on oxybutynin 5 mg 3 times a day. Given her recent altered mental status and fall prior to hospitalization, change her oxybutynin to 5 mg twice a day and monitor.    Goals of care: long term care   Labs/tests ordered: CBC, CMP on 08/31/2016  Family/ staff Communication: reviewed care plan with patient and nursing supervisor    Oneal GroutMAHIMA Madisan Bice, MD Internal Medicine [facility] [address] [place], [place] [address] Cell Phone (Monday-Friday 8 am - 5 pm): [phone] On Call: [phone] and follow prompts after 5  pm and on weekends Office Phone: [phone] Office Fax: [phone]

## 2016-08-31 LAB — BASIC METABOLIC PANEL
BUN: 25 mg/dL — AB (ref 4–21)
Creatinine: 0.9 mg/dL (ref 0.5–1.1)
GLUCOSE: 146 mg/dL
POTASSIUM: 4.2 mmol/L (ref 3.4–5.3)
SODIUM: 143 mmol/L (ref 137–147)

## 2016-08-31 LAB — CBC AND DIFFERENTIAL
HEMATOCRIT: 35 % — AB (ref 36–46)
HEMOGLOBIN: 11.7 g/dL — AB (ref 12.0–16.0)
Platelets: 313 10*3/uL (ref 150–399)
WBC: 10.2 10^3/mL

## 2016-08-31 LAB — HEPATIC FUNCTION PANEL
ALK PHOS: 87 U/L (ref 25–125)
ALT: 13 U/L (ref 7–35)
AST: 17 U/L (ref 13–35)
BILIRUBIN, TOTAL: 0.4 mg/dL

## 2016-09-01 LAB — BASIC METABOLIC PANEL
BUN: 19 mg/dL (ref 4–21)
CREATININE: 0.8 mg/dL (ref 0.5–1.1)
Glucose: 142 mg/dL
POTASSIUM: 4.2 mmol/L (ref 3.4–5.3)
Sodium: 145 mmol/L (ref 137–147)

## 2016-09-26 DIAGNOSIS — A419 Sepsis, unspecified organism: Secondary | ICD-10-CM

## 2016-09-26 HISTORY — DX: Sepsis, unspecified organism: A41.9

## 2016-10-05 ENCOUNTER — Encounter (HOSPITAL_COMMUNITY): Payer: Self-pay

## 2016-10-05 ENCOUNTER — Emergency Department (HOSPITAL_COMMUNITY): Payer: Medicare Other

## 2016-10-05 ENCOUNTER — Inpatient Hospital Stay (HOSPITAL_COMMUNITY)
Admission: EM | Admit: 2016-10-05 | Discharge: 2016-10-09 | DRG: 871 | Disposition: A | Discharge status: Skilled Nursing Facility | Payer: Medicare Other | Attending: Internal Medicine | Admitting: Internal Medicine

## 2016-10-05 DIAGNOSIS — R6520 Severe sepsis without septic shock: Secondary | ICD-10-CM | POA: Diagnosis present

## 2016-10-05 DIAGNOSIS — Z79899 Other long term (current) drug therapy: Secondary | ICD-10-CM

## 2016-10-05 DIAGNOSIS — A419 Sepsis, unspecified organism: Secondary | ICD-10-CM | POA: Diagnosis not present

## 2016-10-05 DIAGNOSIS — E876 Hypokalemia: Secondary | ICD-10-CM | POA: Diagnosis present

## 2016-10-05 DIAGNOSIS — K219 Gastro-esophageal reflux disease without esophagitis: Secondary | ICD-10-CM | POA: Diagnosis present

## 2016-10-05 DIAGNOSIS — F319 Bipolar disorder, unspecified: Secondary | ICD-10-CM | POA: Diagnosis present

## 2016-10-05 DIAGNOSIS — E11610 Type 2 diabetes mellitus with diabetic neuropathic arthropathy: Secondary | ICD-10-CM | POA: Diagnosis present

## 2016-10-05 DIAGNOSIS — Z888 Allergy status to other drugs, medicaments and biological substances status: Secondary | ICD-10-CM

## 2016-10-05 DIAGNOSIS — E1140 Type 2 diabetes mellitus with diabetic neuropathy, unspecified: Secondary | ICD-10-CM | POA: Diagnosis not present

## 2016-10-05 DIAGNOSIS — A4151 Sepsis due to Escherichia coli [E. coli]: Principal | ICD-10-CM | POA: Diagnosis present

## 2016-10-05 DIAGNOSIS — N390 Urinary tract infection, site not specified: Secondary | ICD-10-CM | POA: Diagnosis present

## 2016-10-05 DIAGNOSIS — E785 Hyperlipidemia, unspecified: Secondary | ICD-10-CM | POA: Diagnosis present

## 2016-10-05 DIAGNOSIS — N3281 Overactive bladder: Secondary | ICD-10-CM | POA: Diagnosis present

## 2016-10-05 DIAGNOSIS — Z794 Long term (current) use of insulin: Secondary | ICD-10-CM

## 2016-10-05 DIAGNOSIS — N952 Postmenopausal atrophic vaginitis: Secondary | ICD-10-CM

## 2016-10-05 DIAGNOSIS — R109 Unspecified abdominal pain: Secondary | ICD-10-CM | POA: Diagnosis not present

## 2016-10-05 DIAGNOSIS — E1165 Type 2 diabetes mellitus with hyperglycemia: Secondary | ICD-10-CM | POA: Diagnosis present

## 2016-10-05 DIAGNOSIS — Z96651 Presence of right artificial knee joint: Secondary | ICD-10-CM | POA: Diagnosis present

## 2016-10-05 DIAGNOSIS — R402421 Glasgow coma scale score 9-12, in the field [EMT or ambulance]: Secondary | ICD-10-CM | POA: Diagnosis not present

## 2016-10-05 DIAGNOSIS — R32 Unspecified urinary incontinence: Secondary | ICD-10-CM | POA: Diagnosis present

## 2016-10-05 DIAGNOSIS — E118 Type 2 diabetes mellitus with unspecified complications: Secondary | ICD-10-CM | POA: Diagnosis not present

## 2016-10-05 DIAGNOSIS — Z7982 Long term (current) use of aspirin: Secondary | ICD-10-CM

## 2016-10-05 DIAGNOSIS — E1142 Type 2 diabetes mellitus with diabetic polyneuropathy: Secondary | ICD-10-CM | POA: Diagnosis present

## 2016-10-05 DIAGNOSIS — G9341 Metabolic encephalopathy: Secondary | ICD-10-CM | POA: Diagnosis present

## 2016-10-05 DIAGNOSIS — Z96641 Presence of right artificial hip joint: Secondary | ICD-10-CM | POA: Diagnosis present

## 2016-10-05 DIAGNOSIS — R000 Tachycardia, unspecified: Secondary | ICD-10-CM | POA: Diagnosis present

## 2016-10-05 DIAGNOSIS — K5909 Other constipation: Secondary | ICD-10-CM | POA: Diagnosis present

## 2016-10-05 DIAGNOSIS — G20 Parkinson's disease: Secondary | ICD-10-CM | POA: Diagnosis present

## 2016-10-05 DIAGNOSIS — I10 Essential (primary) hypertension: Secondary | ICD-10-CM | POA: Diagnosis present

## 2016-10-05 DIAGNOSIS — G9340 Encephalopathy, unspecified: Secondary | ICD-10-CM

## 2016-10-05 DIAGNOSIS — Z6837 Body mass index (BMI) 37.0-37.9, adult: Secondary | ICD-10-CM

## 2016-10-05 DIAGNOSIS — R509 Fever, unspecified: Secondary | ICD-10-CM | POA: Diagnosis not present

## 2016-10-05 DIAGNOSIS — Z9181 History of falling: Secondary | ICD-10-CM | POA: Diagnosis not present

## 2016-10-05 DIAGNOSIS — Z885 Allergy status to narcotic agent status: Secondary | ICD-10-CM

## 2016-10-05 DIAGNOSIS — N3000 Acute cystitis without hematuria: Secondary | ICD-10-CM | POA: Diagnosis not present

## 2016-10-05 LAB — CBC WITH DIFFERENTIAL/PLATELET
Basophils Absolute: 0 10*3/uL (ref 0.0–0.1)
Basophils Relative: 0 %
Eosinophils Absolute: 0 10*3/uL (ref 0.0–0.7)
Eosinophils Relative: 0 %
HCT: 48.2 % — ABNORMAL HIGH (ref 36.0–46.0)
Hemoglobin: 16.3 g/dL — ABNORMAL HIGH (ref 12.0–15.0)
Lymphocytes Relative: 8 %
Lymphs Abs: 1.4 10*3/uL (ref 0.7–4.0)
MCH: 30.8 pg (ref 26.0–34.0)
MCHC: 33.8 g/dL (ref 30.0–36.0)
MCV: 91.1 fL (ref 78.0–100.0)
Monocytes Absolute: 1.6 10*3/uL — ABNORMAL HIGH (ref 0.1–1.0)
Monocytes Relative: 9 %
Neutro Abs: 15.3 10*3/uL — ABNORMAL HIGH (ref 1.7–7.7)
Neutrophils Relative %: 83 %
Platelets: 265 10*3/uL (ref 150–400)
RBC: 5.29 MIL/uL — ABNORMAL HIGH (ref 3.87–5.11)
RDW: 14.3 % (ref 11.5–15.5)
WBC: 18.4 10*3/uL — ABNORMAL HIGH (ref 4.0–10.5)

## 2016-10-05 LAB — LACTIC ACID, PLASMA
LACTIC ACID, VENOUS: 2.9 mmol/L — AB (ref 0.5–1.9)
Lactic Acid, Venous: 2.6 mmol/L (ref 0.5–1.9)

## 2016-10-05 LAB — COMPREHENSIVE METABOLIC PANEL
ALT: 9 U/L — ABNORMAL LOW (ref 14–54)
AST: 21 U/L (ref 15–41)
Albumin: 3.1 g/dL — ABNORMAL LOW (ref 3.5–5.0)
Alkaline Phosphatase: 115 U/L (ref 38–126)
Anion gap: 12 (ref 5–15)
BUN: 14 mg/dL (ref 6–20)
CO2: 24 mmol/L (ref 22–32)
Calcium: 9.4 mg/dL (ref 8.9–10.3)
Chloride: 101 mmol/L (ref 101–111)
Creatinine, Ser: 1.05 mg/dL — ABNORMAL HIGH (ref 0.44–1.00)
GFR calc Af Amer: 57 mL/min — ABNORMAL LOW (ref >60)
GFR calc non Af Amer: 49 mL/min — ABNORMAL LOW (ref >60)
Glucose, Bld: 323 mg/dL — ABNORMAL HIGH (ref 65–99)
Potassium: 4.1 mmol/L (ref 3.5–5.1)
Sodium: 137 mmol/L (ref 135–145)
Total Bilirubin: 1.6 mg/dL — ABNORMAL HIGH (ref 0.3–1.2)
Total Protein: 7.6 g/dL (ref 6.5–8.1)

## 2016-10-05 LAB — URINALYSIS, ROUTINE W REFLEX MICROSCOPIC
Bilirubin Urine: NEGATIVE
Glucose, UA: 150 mg/dL — AB
Ketones, ur: NEGATIVE mg/dL
Nitrite: NEGATIVE
Protein, ur: 100 mg/dL — AB
Specific Gravity, Urine: 1.006 (ref 1.005–1.030)
Squamous Epithelial / LPF: NONE SEEN
pH: 5 (ref 5.0–8.0)

## 2016-10-05 LAB — PROTIME-INR
INR: 1.21
Prothrombin Time: 15.3 seconds — ABNORMAL HIGH (ref 11.4–15.2)

## 2016-10-05 LAB — I-STAT CG4 LACTIC ACID, ED
Lactic Acid, Venous: 3.86 mmol/L (ref 0.5–1.9)
Lactic Acid, Venous: 3.05 mmol/L (ref 0.5–1.9)

## 2016-10-05 LAB — INFLUENZA PANEL BY PCR (TYPE A & B)
Influenza A By PCR: NEGATIVE
Influenza B By PCR: NEGATIVE

## 2016-10-05 LAB — CBG MONITORING, ED
Glucose-Capillary: 304 mg/dL — ABNORMAL HIGH (ref 65–99)
Glucose-Capillary: 301 mg/dL — ABNORMAL HIGH (ref 65–99)
Glucose-Capillary: 249 mg/dL — ABNORMAL HIGH (ref 65–99)

## 2016-10-05 LAB — PROCALCITONIN: PROCALCITONIN: 2.86 ng/mL

## 2016-10-05 MED ORDER — LISINOPRIL 20 MG PO TABS
20.0000 mg | ORAL_TABLET | Freq: Every day | ORAL | Status: DC
Start: 2016-10-05 — End: 2016-10-09
  Administered 2016-10-06 – 2016-10-09 (×4): 20 mg via ORAL
  Filled 2016-10-05 (×5): qty 1

## 2016-10-05 MED ORDER — ALBUTEROL SULFATE (2.5 MG/3ML) 0.083% IN NEBU
2.5000 mg | INHALATION_SOLUTION | RESPIRATORY_TRACT | Status: DC
  Administered 2016-10-05: 2.5 mg via RESPIRATORY_TRACT
  Filled 2016-10-05 (×3): qty 3

## 2016-10-05 MED ORDER — DULOXETINE HCL 60 MG PO CPEP
60.0000 mg | ORAL_CAPSULE | Freq: Every day | ORAL | Status: DC
  Administered 2016-10-06 – 2016-10-09 (×4): 60 mg via ORAL
  Filled 2016-10-05 (×5): qty 1

## 2016-10-05 MED ORDER — SODIUM CHLORIDE 0.9 % IV SOLN
INTRAVENOUS | Status: AC
  Administered 2016-10-05: 13:00:00 via INTRAVENOUS

## 2016-10-05 MED ORDER — ACETAMINOPHEN 650 MG RE SUPP: 650.0000 mg | Freq: Four times a day (QID) | RECTAL | Status: DC | PRN

## 2016-10-05 MED ORDER — ATORVASTATIN CALCIUM 10 MG PO TABS
10.0000 mg | ORAL_TABLET | Freq: Every day | ORAL | Status: DC
  Administered 2016-10-06 – 2016-10-09 (×3): 10 mg via ORAL
  Filled 2016-10-05 (×5): qty 1

## 2016-10-05 MED ORDER — CARBIDOPA-LEVODOPA 25-100 MG PO TBDP: 1.0000 | ORAL_TABLET | Freq: Two times a day (BID) | ORAL | Status: DC

## 2016-10-05 MED ORDER — ACETAMINOPHEN 650 MG RE SUPP
650.0000 mg | Freq: Once | RECTAL | Status: AC
  Administered 2016-10-05: 650 mg via RECTAL
  Filled 2016-10-05: qty 1

## 2016-10-05 MED ORDER — CEFEPIME HCL 2 G IJ SOLR
2.0000 g | Freq: Two times a day (BID) | INTRAMUSCULAR | Status: DC
  Administered 2016-10-05 – 2016-10-06 (×2): 2 g via INTRAVENOUS
  Filled 2016-10-05 (×2): qty 2

## 2016-10-05 MED ORDER — BISACODYL 10 MG RE SUPP: 10.0000 mg | Freq: Every day | RECTAL | Status: DC | PRN

## 2016-10-05 MED ORDER — ARIPIPRAZOLE 2 MG PO TABS
2.0000 mg | ORAL_TABLET | Freq: Every day | ORAL | Status: DC
  Administered 2016-10-06 – 2016-10-09 (×4): 2 mg via ORAL
  Filled 2016-10-05 (×5): qty 1

## 2016-10-05 MED ORDER — MAGNESIUM OXIDE 400 (241.3 MG) MG PO TABS
400.0000 mg | ORAL_TABLET | Freq: Every day | ORAL | Status: DC
  Administered 2016-10-06 – 2016-10-09 (×3): 400 mg via ORAL
  Filled 2016-10-05 (×5): qty 1

## 2016-10-05 MED ORDER — SODIUM CHLORIDE 0.9 % IV BOLUS (SEPSIS)
30.0000 mL/kg | Freq: Once | INTRAVENOUS | Status: AC
  Administered 2016-10-05: 3321 mL via INTRAVENOUS

## 2016-10-05 MED ORDER — CARBIDOPA-LEVODOPA 25-100 MG PO TABS
1.0000 | ORAL_TABLET | Freq: Two times a day (BID) | ORAL | Status: DC
  Administered 2016-10-06 – 2016-10-09 (×7): 1 via ORAL
  Filled 2016-10-05 (×9): qty 1

## 2016-10-05 MED ORDER — ESTROGENS, CONJUGATED 0.625 MG/GM VA CREA
1.0000 | TOPICAL_CREAM | VAGINAL | Status: DC
  Filled 2016-10-05: qty 30

## 2016-10-05 MED ORDER — INSULIN GLARGINE 100 UNIT/ML ~~LOC~~ SOLN
35.0000 [IU] | Freq: Two times a day (BID) | SUBCUTANEOUS | Status: DC
  Administered 2016-10-05: 35 [IU] via SUBCUTANEOUS
  Filled 2016-10-05 (×2): qty 0.35

## 2016-10-05 MED ORDER — DEXTROSE 5 % IV SOLN: 2.0000 g | Freq: Once | INTRAVENOUS | Status: DC

## 2016-10-05 MED ORDER — ALBUTEROL SULFATE (2.5 MG/3ML) 0.083% IN NEBU
2.5000 mg | INHALATION_SOLUTION | RESPIRATORY_TRACT | Status: DC | PRN
  Administered 2016-10-07: 2.5 mg via RESPIRATORY_TRACT
  Filled 2016-10-05: qty 3

## 2016-10-05 MED ORDER — ESTROGENS, CONJUGATED 0.625 MG/GM VA CREA: 1.0000 | TOPICAL_CREAM | VAGINAL | Status: DC

## 2016-10-05 MED ORDER — ACETAMINOPHEN 325 MG PO TABS
650.0000 mg | ORAL_TABLET | Freq: Four times a day (QID) | ORAL | Status: DC | PRN
  Administered 2016-10-06 – 2016-10-08 (×2): 650 mg via ORAL
  Filled 2016-10-05 (×2): qty 2

## 2016-10-05 MED ORDER — ONDANSETRON HCL 4 MG/2ML IJ SOLN: 4.0000 mg | Freq: Four times a day (QID) | INTRAMUSCULAR | Status: DC | PRN

## 2016-10-05 MED ORDER — BISACODYL 5 MG PO TBEC: 5.0000 mg | DELAYED_RELEASE_TABLET | Freq: Every day | ORAL | Status: DC | PRN

## 2016-10-05 MED ORDER — INSULIN ASPART 100 UNIT/ML ~~LOC~~ SOLN: 0.0000 [IU] | Freq: Every day | SUBCUTANEOUS | Status: DC

## 2016-10-05 MED ORDER — SENNOSIDES-DOCUSATE SODIUM 8.6-50 MG PO TABS
2.0000 | ORAL_TABLET | Freq: Two times a day (BID) | ORAL | Status: DC
  Administered 2016-10-06 – 2016-10-09 (×5): 2 via ORAL
  Filled 2016-10-05 (×8): qty 2

## 2016-10-05 MED ORDER — METOPROLOL TARTRATE 25 MG PO TABS
25.0000 mg | ORAL_TABLET | Freq: Two times a day (BID) | ORAL | Status: DC
  Administered 2016-10-06 – 2016-10-09 (×7): 25 mg via ORAL
  Filled 2016-10-05 (×7): qty 1

## 2016-10-05 MED ORDER — ONDANSETRON HCL 4 MG PO TABS: 4.0000 mg | ORAL_TABLET | Freq: Four times a day (QID) | ORAL | Status: DC | PRN

## 2016-10-05 MED ORDER — ENOXAPARIN SODIUM 40 MG/0.4ML ~~LOC~~ SOLN
40.0000 mg | SUBCUTANEOUS | Status: DC
  Administered 2016-10-06: 40 mg via SUBCUTANEOUS
  Filled 2016-10-05: qty 0.4

## 2016-10-05 MED ORDER — SODIUM CHLORIDE 0.9% FLUSH
3.0000 mL | Freq: Two times a day (BID) | INTRAVENOUS | Status: DC
  Administered 2016-10-06 – 2016-10-09 (×4): 3 mL via INTRAVENOUS

## 2016-10-05 MED ORDER — VANCOMYCIN HCL IN DEXTROSE 1-5 GM/200ML-% IV SOLN
1000.0000 mg | Freq: Once | INTRAVENOUS | Status: AC
  Administered 2016-10-05: 1000 mg via INTRAVENOUS
  Filled 2016-10-05: qty 200

## 2016-10-05 MED ORDER — PIPERACILLIN-TAZOBACTAM 3.375 G IVPB 30 MIN
3.3750 g | Freq: Once | INTRAVENOUS | Status: AC
Start: 2016-10-05 — End: 2016-10-05
  Administered 2016-10-05: 3.375 g via INTRAVENOUS
  Filled 2016-10-05: qty 50

## 2016-10-05 MED ORDER — INSULIN ASPART 100 UNIT/ML ~~LOC~~ SOLN
0.0000 [IU] | Freq: Three times a day (TID) | SUBCUTANEOUS | Status: DC
  Administered 2016-10-05: 7 [IU] via SUBCUTANEOUS
  Administered 2016-10-06: 3 [IU] via SUBCUTANEOUS
  Administered 2016-10-06: 5 [IU] via SUBCUTANEOUS
  Administered 2016-10-06: 3 [IU] via SUBCUTANEOUS
  Administered 2016-10-07: 2 [IU] via SUBCUTANEOUS
  Administered 2016-10-07 (×2): 5 [IU] via SUBCUTANEOUS
  Administered 2016-10-08 (×2): 3 [IU] via SUBCUTANEOUS
  Administered 2016-10-08: 2 [IU] via SUBCUTANEOUS
  Administered 2016-10-09: 3 [IU] via SUBCUTANEOUS
  Administered 2016-10-09: 2 [IU] via SUBCUTANEOUS
  Filled 2016-10-05: qty 1

## 2016-10-05 MED ORDER — SODIUM CHLORIDE 0.9 % IV BOLUS (SEPSIS)
500.0000 mL | Freq: Once | INTRAVENOUS | Status: AC
  Administered 2016-10-05: 500 mL via INTRAVENOUS

## 2016-10-05 NOTE — ED Triage Notes (Signed)
[facility]  [facility]. Staff reported at 0600 am pt. Was unresponsive, lethargic and warm to touch.  Paramedics reports upon arrival pt. Was responding only to sternal rub.  She was warm to touch.  Pt. Was fighting the staff when they were assessing her.  Upon arrival to the ED. Pt. Is fevered 102.2 rectal and alert and able to follow commands.  Skin is flushed and warm to toucn.

## 2016-10-05 NOTE — Progress Notes (Signed)
Pharmacy Antibiotic Note  Maureen Taylor is a 82 y.o. female admitted on 10/05/2016 with UTI and AMS.  Pharmacy has been consulted for cefepime dosing.  Plan: Cefepime 2g IV q12h  Monitor culture data, renal function and clinical course  Height: 5\' 8"  (172.7 cm) Weight: 244 lb (110.7 kg) IBW/kg (Calculated) : 63.9  Temp (24hrs), Avg:102.2 F (39 C), Min:102.2 F (39 C), Max:102.2 F (39 C)   Recent Labs Lab 10/05/16 0931 10/05/16 0948  WBC 18.4*  --   CREATININE 1.05*  --   LATICACIDVEN  --  3.86*    Estimated Creatinine Clearance: 56.7 mL/min (by C-G formula based on SCr of 1.05 mg/dL (H)).    Allergies  Allergen Reactions  . Codeine Itching  . Morphine And Related Itching  . Indocin [Indomethacin] Rash     [name], PharmD, BCPS Clinical Pharmacist Pager [phone] 10/05/2016 12:21 PM

## 2016-10-05 NOTE — ED Notes (Signed)
Called Maureen Taylor regarding pt's blood sugar of 301 and sliding scale Novolog orders.  Pt is not alert enough to eat and failed the stroke swallow screen.  Per Maureen Taylor, administer 7 units of Novolog.

## 2016-10-05 NOTE — ED Provider Notes (Signed)
[facility]-EMERGENCY DEPT Provider Note   CSN: [id number] Arrival date & time: 10/05/16  0903     History   Chief Complaint Chief Complaint  Patient presents with  . Fever  . Altered Mental Status    HPI Maureen Taylor is a 82 y.o. female.  HPI   82 year old female transferred from [facility] for evaluation of decreased mental status. Repoterdly went to [facility] after discharge from the hospitall in early December with UTI. Staff first noticed her in this condition around 6:00 this morning. Currently, patient is very drowsy. She will open her eyes to voice but is nonverbal and not following commands. EMS reports that she was only responding to sternal or for them but staff at [facility] said she was initially combative for them this morning as they were trying to assess her.  Past Medical History:  Diagnosis Date  . Allergic rhinitis   . Ascorbic acid deficiency   . Bipolar affective disorder (HCC)   . Candidiasis   . Charcot's joint   . Chronic constipation   . Dysuria   . GERD (gastroesophageal reflux disease)   . History of falling   . HLD (hyperlipidemia)   . HTN (hypertension)   . Magnesium deficiency   . Major depressive disorder   . Mixed incontinence   . Muscle weakness   . Neuropathic pain   . Neuropathy (HCC)   . OAB (overactive bladder)   . Obesity   . Oral pain   . Parkinson disease (HCC)   . Pneumonia   . Protein calorie malnutrition (HCC)   . Rosacea   . Slow transit constipation   . Type 2 diabetes mellitus with diabetic polyneuropathy, with long-term current use of insulin (HCC)   . Urinary tract infection without hematuria   . Vaginal atrophy   . Vitamin deficiency     Patient Active Problem List   Diagnosis Date Noted  . Acute cystitis without hematuria   . Morbid obesity (HCC)   . UTI (urinary tract infection) 08/24/2016  . Altered mental status 08/24/2016  . Constipation 08/24/2016  . Hyperlipidemia 06/05/2015  . Type 2 diabetes  mellitus with diabetic polyneuropathy (HCC) 06/05/2015  . Vitamin D deficiency 06/05/2015  . Recurrent UTI 04/17/2015  . Vaginal atrophy 04/17/2015  . Incontinence 04/17/2015  . OAB (overactive bladder) 03/03/2015  . Bipolar depression (HCC) 01/05/2015  . GERD (gastroesophageal reflux disease) 01/05/2015  . Allergic rhinitis 01/05/2015  . Essential hypertension 01/05/2015  . Candida infection 01/05/2015  . Parkinson's disease (HCC) 01/05/2015  . Neuropathy (HCC) 01/05/2015  . Diabetes mellitus with neuropathy (HCC) 01/05/2015    Past Surgical History:  Procedure Laterality Date  . ABDOMINAL HYSTERECTOMY    . CERVICAL CONIZATION W/BX    . CHOLECYSTECTOMY    . REVISION TOTAL KNEE ARTHROPLASTY Right     OB History    No data available       Home Medications    Prior to Admission medications   Medication Sig Start Date End Date Taking? Authorizing Provider  acetaminophen (TYLENOL) 325 MG tablet Take 650 mg by mouth every 6 (six) hours as needed for mild pain.     Historical Provider, MD  ARIPiprazole (ABILIFY) 2 MG tablet Take 2 mg by mouth daily.    Historical Provider, MD  aspirin 81 MG EC tablet Take 81 mg by mouth daily. Swallow whole.    Historical Provider, MD  atorvastatin (LIPITOR) 10 MG tablet Take 10 mg by mouth daily.  Historical Provider, MD  bisacodyl (DULCOLAX) 10 MG suppository Place 10 mg rectally daily as needed for moderate constipation.    Historical Provider, MD  carbidopa-levodopa (PARCOPA) 25-100 MG disintegrating tablet Take 1 tablet by mouth 2 (two) times daily.     Historical Provider, MD  conjugated estrogens (PREMARIN) vaginal cream Place 1 Applicatorful vaginally daily. Apply 0.5mg  (pea-sized amount)  just inside the vaginal introitus with a finger-tip every night for two weeks and then Monday, Wednesday and Friday nights. 02/25/16   Carollee HerterShannon A McGowan, PA-C  Cranberry 200 MG CAPS Take 400 mg by mouth daily. Take two 200-mg capsules = 400 mg     Historical Provider, MD  DULoxetine (CYMBALTA) 60 MG capsule Take 60 mg by mouth daily.    Historical Provider, MD  gabapentin (NEURONTIN) 100 MG capsule Take 200 mg by mouth 2 (two) times daily. Take two 100 mg capsules to = 200 mg PO BID    Historical Provider, MD  glimepiride (AMARYL) 4 MG tablet Take 4 mg by mouth daily with breakfast.    Historical Provider, MD  insulin aspart (NOVOLOG) 100 UNIT/ML injection Inject 8-12 Units into the skin 2 (two) times daily as needed for high blood sugar. IF CBG >200, GIVE NOVOLOG 8 UNITS SQ TWO TIMES DAILY AS NEEDED IF CBG >400, GIVE NOVOLOG 12 UNITS SQ TWO TIMES DAILY AS NEEDED    Historical Provider, MD  insulin glargine (LANTUS) 100 UNIT/ML injection Inject 0.35 mLs (35 Units total) into the skin 2 (two) times daily. QAM and QHS 08/27/16   Vassie Lollarlos Madera, MD  Lactobacillus (ACIDOPHILUS PO) Take 1 capsule by mouth daily. Stop date 09/02/16    Historical Provider, MD  lamoTRIgine (LAMICTAL) 100 MG tablet Take 100 mg by mouth 2 (two) times daily.     Historical Provider, MD  lisinopril (PRINIVIL,ZESTRIL) 20 MG tablet Take 20 mg by mouth daily.    Historical Provider, MD  loratadine (CLARITIN) 10 MG tablet Take 10 mg by mouth daily.     Historical Provider, MD  magnesium oxide (MAG-OX) 400 MG tablet Take 400 mg by mouth daily.    Historical Provider, MD  metoprolol tartrate (LOPRESSOR) 25 MG tablet Take 25 mg by mouth 2 (two) times daily. Take 25 mg by mouth twice a day for HTN    Historical Provider, MD  METRONIDAZOLE, TOPICAL, (METROLOTION) 0.75 % LOTN Apply 1 application topically 2 (two) times daily. Starting 08/12/16. To face, for rosacea.    Historical Provider, MD  multivitamin-lutein (OCUVITE-LUTEIN) CAPS capsule Take 1 capsule by mouth daily.    Historical Provider, MD  oxybutynin (DITROPAN) 5 MG tablet Take 5 mg by mouth 3 (three) times daily.    Historical Provider, MD  pantoprazole (PROTONIX) 40 MG tablet Take 40 mg by mouth daily.     Historical  Provider, MD  phenazopyridine (PYRIDIUM) 100 MG tablet Take 1 tablet (100 mg total) by mouth 3 (three) times daily as needed for pain. 08/27/16   Vassie Lollarlos Madera, MD  polyethylene glycol [facility](MIRALAX / GLYCOLAX) packet Take 17 g by mouth 2 (two) times daily.    Historical Provider, MD  Propylene Glycol (SYSTANE BALANCE OP) Apply 1 drop to eye 2 (two) times daily. Both eyes    Historical Provider, MD  Protein (PROCEL PO) Take 1 scoop by mouth 2 (two) times daily.     Historical Provider, MD  sennosides-docusate sodium (SENOKOT-S) 8.6-50 MG tablet Take 2 tablets by mouth 2 (two) times daily.    Historical Provider, MD  Family History Family History  Problem Relation Age of Onset  . Colon cancer Father   . Colon cancer Sister   . Stomach cancer Sister   . Rectal cancer Sister   . Kidney disease Neg Hx   . Bladder Cancer Neg Hx   . Prostate cancer Neg Hx     Social History Social History  Substance Use Topics  . Smoking status: Never Smoker  . Smokeless tobacco: Never Used  . Alcohol use No     Allergies   Codeine; Morphine and related; and Indocin [indomethacin]   Review of Systems Review of Systems  Level V caveat because patient is nonverbal currently. Physical Exam Updated Vital Signs BP 120/97 (BP Location: Right Arm)   Pulse (!) 123   Temp 102.2 F (39 C) (Rectal)   Resp 24   Ht 5\' 8"  (1.727 m)   Wt 244 lb (110.7 kg)   SpO2 94%   BMI 37.10 kg/m   Physical Exam  Constitutional:  Elderly female laying in bed. Obese.  HENT:  Head: Normocephalic and atraumatic.  Eyes: Conjunctivae are normal. Pupils are equal, round, and reactive to light. Right eye exhibits no discharge. Left eye exhibits no discharge.  Cardiovascular: Regular rhythm and normal heart sounds.  Exam reveals no gallop and no friction rub.   No murmur heard. Tachycardic with a regular rhythm  Pulmonary/Chest: Breath sounds normal. No respiratory distress.  She is tachypneic with respiration rate  around 25. There is no accessory muscle usage though. No adventitious breath sounds appreciated.  Abdominal: Soft. She exhibits no distension. There is no tenderness.  Musculoskeletal: She exhibits no edema or tenderness.  Neurological:  Drowsy. Will open her eyes to voice. She does move her extremities spontaneously but not on command.  Skin: Skin is warm and dry.  She is warm to the touch. No obvious wounds noted.  Psychiatric:  Unable to assess  Nursing note and vitals reviewed.    ED Treatments / Results  Labs (all labs ordered are listed, but only abnormal results are displayed) Labs Reviewed  CULTURE, BLOOD (ROUTINE X 2) - Abnormal; Notable for the following:       Result Value   Culture ESCHERICHIA COLI (*)    All other components within normal limits  URINE CULTURE - Abnormal; Notable for the following:    Culture >=100,000 COLONIES/mL ESCHERICHIA COLI (*)    Organism ID, Bacteria ESCHERICHIA COLI (*)    All other components within normal limits  BLOOD CULTURE ID PANEL (REFLEXED) - Abnormal; Notable for the following:    Enterobacteriaceae species   (*)    Value: CRITICAL RESULT CALLED TO, READ BACK BY AND VERIFIED WITH:   Escherichia coli DETECTED (*)    All other components within normal limits  COMPREHENSIVE METABOLIC PANEL - Abnormal; Notable for the following:    Glucose, Bld 323 (*)    Creatinine, Ser 1.05 (*)    Albumin 3.1 (*)    ALT 9 (*)    Total Bilirubin 1.6 (*)    GFR calc non Af Amer 49 (*)    GFR calc Af Amer 57 (*)    All other components within normal limits  CBC WITH DIFFERENTIAL/PLATELET - Abnormal; Notable for the following:    WBC 18.4 (*)    RBC 5.29 (*)    Hemoglobin 16.3 (*)    HCT 48.2 (*)    Neutro Abs 15.3 (*)    Monocytes Absolute 1.6 (*)    All other  components within normal limits  PROTIME-INR - Abnormal; Notable for the following:    Prothrombin Time 15.3 (*)    All other components within normal limits  URINALYSIS, ROUTINE W  REFLEX MICROSCOPIC - Abnormal; Notable for the following:    APPearance TURBID (*)    Glucose, UA 150 (*)    Hgb urine dipstick MODERATE (*)    Protein, ur 100 (*)    Leukocytes, UA LARGE (*)    Bacteria, UA MANY (*)    Non Squamous Epithelial 6-30 (*)    All other components within normal limits  LACTIC ACID, PLASMA - Abnormal; Notable for the following:    Lactic Acid, Venous 2.6 (*)    All other components within normal limits  LACTIC ACID, PLASMA - Abnormal; Notable for the following:    Lactic Acid, Venous 2.9 (*)    All other components within normal limits  HEMOGLOBIN A1C - Abnormal; Notable for the following:    Hgb A1c MFr Bld 9.8 (*)    All other components within normal limits  BASIC METABOLIC PANEL - Abnormal; Notable for the following:    Potassium 3.4 (*)    Chloride 112 (*)    Glucose, Bld 185 (*)    Calcium 8.2 (*)    GFR calc non Af Amer 59 (*)    All other components within normal limits  CBC - Abnormal; Notable for the following:    WBC 14.4 (*)    All other components within normal limits  GLUCOSE, CAPILLARY - Abnormal; Notable for the following:    Glucose-Capillary 166 (*)    All other components within normal limits  GLUCOSE, CAPILLARY - Abnormal; Notable for the following:    Glucose-Capillary 163 (*)    All other components within normal limits  CBC - Abnormal; Notable for the following:    WBC 15.0 (*)    RBC 3.79 (*)    Hemoglobin 11.7 (*)    HCT 35.4 (*)    All other components within normal limits  BASIC METABOLIC PANEL - Abnormal; Notable for the following:    Potassium 3.4 (*)    Glucose, Bld 132 (*)    Calcium 8.3 (*)    GFR calc non Af Amer 58 (*)    All other components within normal limits  GLUCOSE, CAPILLARY - Abnormal; Notable for the following:    Glucose-Capillary 222 (*)    All other components within normal limits  GLUCOSE, CAPILLARY - Abnormal; Notable for the following:    Glucose-Capillary 163 (*)    All other components  within normal limits  GLUCOSE, CAPILLARY - Abnormal; Notable for the following:    Glucose-Capillary 128 (*)    All other components within normal limits  GLUCOSE, CAPILLARY - Abnormal; Notable for the following:    Glucose-Capillary 215 (*)    All other components within normal limits  I-STAT CG4 LACTIC ACID, ED - Abnormal; Notable for the following:    Lactic Acid, Venous 3.86 (*)    All other components within normal limits  I-STAT CG4 LACTIC ACID, ED - Abnormal; Notable for the following:    Lactic Acid, Venous 3.05 (*)    All other components within normal limits  CBG MONITORING, ED - Abnormal; Notable for the following:    Glucose-Capillary 304 (*)    All other components within normal limits  CBG MONITORING, ED - Abnormal; Notable for the following:    Glucose-Capillary 301 (*)    All other components within normal limits  CBG MONITORING, ED - Abnormal; Notable for the following:    Glucose-Capillary 249 (*)    All other components within normal limits  CULTURE, BLOOD (ROUTINE X 2)  INFLUENZA PANEL BY PCR (TYPE A & B, H1N1)  PROCALCITONIN  MAGNESIUM    EKG  EKG Interpretation  Date/Time:  Wednesday October 05 2016 09:09:12 EST Ventricular Rate:  126 PR Interval:    QRS Duration: 85 QT Interval:  325 QTC Calculation: 471 R Axis:   23 Text Interpretation:  Sinus tachycardia Confirmed by Juleen China  MD, Dorsey Authement ([id number]) on 10/05/2016 9:53:48 AM       Radiology No results found.  Procedures Procedures (including critical care time)  Medications Ordered in ED Medications  vancomycin (VANCOCIN) IVPB 1000 mg/200 mL premix (not administered)  piperacillin-tazobactam (ZOSYN) IVPB 3.375 g (not administered)  acetaminophen (TYLENOL) suppository 650 mg (not administered)  sodium chloride 0.9 % bolus 3,321 mL (3,321 mLs Intravenous New Bag/Given 10/05/16 0934)     Initial Impression / Assessment and Plan / ED Course  I have reviewed the triage vital signs and the nursing  notes.  Pertinent labs & imaging results that were available during my care of the patient were reviewed by me and considered in my medical decision making (see chart for details).  Clinical Course    82:73 AM 82 year old female with decreased mental status. Encephalopathy likely secondary to infectious process. She is febrile, tachypneic and tachycardic.  Currently normotensive, but initial lactic acid is almost 4.  She has two PIVs. She will be given a 30 mL/kg bolus and empiric abx. Infectious work-up including influenza. Will need admitted.   Final Clinical Impressions(s) / ED Diagnoses   Final diagnoses:  Severe sepsis (HCC)  Urinary tract infection without hematuria, site unspecified  Encephalopathy    New Prescriptions New Prescriptions   No medications on file     Raeford Razor, MD 10/07/16 1431

## 2016-10-05 NOTE — ED Notes (Signed)
Delaney MeigsJoni (Daughter) [phone]

## 2016-10-05 NOTE — H&P (Signed)
History and Physical    Maureen MustardJessie B Friesz FAO:[id number] DOB: [birth year] DOA: 10/05/2016  PCP: Oneal GroutPANDEY, MAHIMA, MD Patient coming from: facility [facility]  Chief Complaint: lethargic/fever  HPI: Maureen Taylor is a 82 y.o. female with medical history significant for Parkinson's disease, diabetes, hypertension, currently UTIs, bipolar [facility] chief complaint acute encephalopathy and fever. Initial evaluation reveals sepsis likely related to urinary tract infection  Information is obtained from the chart as patient currently noncommunicative but is more alert. Chart review indicates staff at the facility noted patient to be flushed, lethargic and warm to touch. EMS was called and noted she would respond to sternal rub. He came time to transport her however she was fighting with staff and moving all extremities. No reports of any recent nausea vomiting diarrhea. Chart indicates she was discharged from the hospital approximately a month ago after a fall related to altered mental status secondary to urinary tract infection.   ED Course: In the emergency department she's more alert rectal temp is 102. Tachycardia and 124 tachypnea when 29 pressure stable on the high end of normal. In the emergency department she receives fluid resuscitation IV antibiotics and Tylenol. At the time of admission she is more alert heart rate trending down respiratory rate 17 oxygen saturation level 96% on 2 L  Review of Systems: As per HPI otherwise 10 point review of systems negative.   Ambulatory Status: Chart review indicates patient ambulates with walker with unsteady gait related to worsening Parkinson's and deconditioning. Had a fall as recently as last month  Past Medical History:  Diagnosis Date  . Allergic rhinitis   . Ascorbic acid deficiency   . Bipolar affective disorder (HCC)   . Candidiasis   . Charcot's joint   . Chronic constipation   . Dysuria   . GERD  (gastroesophageal reflux disease)   . History of falling   . HLD (hyperlipidemia)   . HTN (hypertension)   . Magnesium deficiency   . Major depressive disorder   . Mixed incontinence   . Muscle weakness   . Neuropathic pain   . Neuropathy (HCC)   . OAB (overactive bladder)   . Obesity   . Oral pain   . Parkinson disease (HCC)   . Pneumonia   . Protein calorie malnutrition (HCC)   . Rosacea   . Slow transit constipation   . Type 2 diabetes mellitus with diabetic polyneuropathy, with long-term current use of insulin (HCC)   . Urinary tract infection without hematuria   . Vaginal atrophy   . Vitamin deficiency     Past Surgical History:  Procedure Laterality Date  . ABDOMINAL HYSTERECTOMY    . CERVICAL CONIZATION W/BX    . CHOLECYSTECTOMY    . REVISION TOTAL KNEE ARTHROPLASTY Right     Social History   Social History  . Marital status: Widowed    Spouse name: N/A  . Number of children: N/A  . Years of education: N/A   Occupational History  . Not on file.   Social History Main Topics  . Smoking status: Never Smoker  . Smokeless tobacco: Never Used  . Alcohol use No  . Drug use: No  . Sexual activity: No   Other Topics Concern  . Not on file   Social History Narrative  . No narrative on file   [facility] Allergies  Allergen Reactions  . Codeine Itching  . Morphine And Related Itching  . Indocin [  Indomethacin] Rash    Family History  Problem Relation Age of Onset  . Colon cancer Father   . Colon cancer Sister   . Stomach cancer Sister   . Rectal cancer Sister   . Kidney disease Neg Hx   . Bladder Cancer Neg Hx   . Prostate cancer Neg Hx     Prior to Admission medications   Medication Sig Start Date End Date Taking? Authorizing Provider  acetaminophen (TYLENOL) 325 MG tablet Take 650 mg by mouth every 6 (six) hours as needed for mild pain.    Yes Historical Provider, MD  ARIPiprazole (ABILIFY) 2 MG tablet Take 2 mg by mouth daily.    Yes Historical Provider, MD  aspirin 81 MG EC tablet Take 81 mg by mouth daily. Swallow whole.   Yes Historical Provider, MD  atorvastatin (LIPITOR) 10 MG tablet Take 10 mg by mouth daily.   Yes Historical Provider, MD  bisacodyl (DULCOLAX) 10 MG suppository Place 10 mg rectally daily as needed for moderate constipation.   Yes Historical Provider, MD  carbidopa-levodopa (PARCOPA) 25-100 MG disintegrating tablet Take 1 tablet by mouth 2 (two) times daily.    Yes Historical Provider, MD  conjugated estrogens (PREMARIN) vaginal cream Place 1 Applicatorful vaginally daily. Apply 0.5mg  (pea-sized amount)  just inside the vaginal introitus with a finger-tip every night for two weeks and then Monday, Wednesday and Friday nights. Patient taking differently: Place 1 Applicatorful vaginally every Monday, Wednesday, and Friday. Apply 0.5mg  (pea-sized amount)  just inside the vaginal introitus with a finger-tip every Monday, Wednesday and Friday nights. 02/25/16  Yes Shannon A McGowan, PA-C  Cranberry 200 MG CAPS Take 400 mg by mouth daily. Take two 200-mg capsules = 400 mg   Yes Historical Provider, MD  DULoxetine (CYMBALTA) 60 MG capsule Take 60 mg by mouth daily.   Yes Historical Provider, MD  gabapentin (NEURONTIN) 100 MG capsule Take 200 mg by mouth at bedtime.    Yes Historical Provider, MD  glimepiride (AMARYL) 4 MG tablet Take 4 mg by mouth daily with breakfast.   Yes Historical Provider, MD  insulin glargine (LANTUS) 100 UNIT/ML injection Inject 0.35 mLs (35 Units total) into the skin 2 (two) times daily. QAM and QHS 08/27/16  Yes Vassie Loll, MD  insulin lispro (HUMALOG) 100 UNIT/ML injection Inject 8-12 Units into the skin See admin instructions. Check CBG BID for CBG of 200-399 give 8 unit; for CBG>400 give 12 units hold for CBG <200-   Yes Historical Provider, MD  lamoTRIgine (LAMICTAL) 100 MG tablet Take 100 mg by mouth 2 (two) times daily.    Yes Historical Provider, MD  lisinopril (PRINIVIL,ZESTRIL)  20 MG tablet Take 20 mg by mouth daily.   Yes Historical Provider, MD  loratadine (CLARITIN) 10 MG tablet Take 10 mg by mouth daily.    Yes Historical Provider, MD  magnesium oxide (MAG-OX) 400 MG tablet Take 400 mg by mouth daily.   Yes Historical Provider, MD  metoprolol tartrate (LOPRESSOR) 25 MG tablet Take 25 mg by mouth 2 (two) times daily. Take 25 mg by mouth twice a day for HTN   Yes Historical Provider, MD  METRONIDAZOLE, TOPICAL, (METROLOTION) 0.75 % LOTN Apply 1 application topically 2 (two) times daily. Starting 08/12/16. To face, for rosacea.   Yes Historical Provider, MD  multivitamin-lutein (OCUVITE-LUTEIN) CAPS capsule Take 1 capsule by mouth daily.   Yes Historical Provider, MD  oxybutynin (DITROPAN) 5 MG tablet Take 5 mg by mouth 2 (two) times  daily.    Yes Historical Provider, MD  pantoprazole (PROTONIX) 40 MG tablet Take 40 mg by mouth daily.    Yes Historical Provider, MD  polyethylene glycol (MIRALAX / GLYCOLAX) packet Take 17 g by mouth 2 (two) times daily.   Yes Historical Provider, MD  Propylene Glycol (SYSTANE BALANCE OP) Apply 1 drop to eye 2 (two) times daily. Both eyes   Yes Historical Provider, MD  Protein (PROCEL PO) Take 1 scoop by mouth 2 (two) times daily.    Yes Historical Provider, MD  sennosides-docusate sodium (SENOKOT-S) 8.6-50 MG tablet Take 2 tablets by mouth 2 (two) times daily.   Yes Historical Provider, MD    Physical Exam: Vitals:   10/05/16 1115 10/05/16 1130 10/05/16 1145 10/05/16 1200  BP: 135/68   133/66  Pulse: 115 112 114 110  Resp: 22 21 17    Temp:      TempSrc:      SpO2: 94% 96% 96% 96%  Weight:      Height:         General:  Appears somewhat anxious quite flushed warm to touch does not appear uncomfortable Eyes:  PERRL, EOMI, normal lids, iris ENT:  grossly normal hearing, lips & tongue, his membranes of her mouth are pink but dry Neck:  no LAD, masses or thyromegaly Cardiovascular:  RRR, no m/r/g. No LE edema.  Respiratory:    Normal respiratory effort. Mild end expiratory wheezing particularly anterior no crackles Abdomen:  soft, ntnd, obese positive bowel sounds somewhat sluggish no guarding or rebounding Skin:  no rash or induration seen on limited exam  Musculoskeletal:  grossly normal tone BUE/BLE, good ROM, no bony abnormality Psychiatric:  grossly normal mood and affect, speech fluent and appropriate, AOx3 Neurologic:  Alert attempts to follow commands non-verbal but will grunt in answer to question   Labs on Admission: I have personally reviewed following labs and imaging studies  CBC:  Recent Labs Lab 10/05/16 0931  WBC 18.4*  NEUTROABS 15.3*  HGB 16.3*  HCT 48.2*  MCV 91.1  PLT 265   Basic Metabolic Panel:  Recent Labs Lab 10/05/16 0931  NA 137  K 4.1  CL 101  CO2 24  GLUCOSE 323*  BUN 14  CREATININE 1.05*  CALCIUM 9.4   GFR: Estimated Creatinine Clearance: 56.7 mL/min (by C-G formula based on SCr of 1.05 mg/dL (H)). Liver Function Tests:  Recent Labs Lab 10/05/16 0931  AST 21  ALT 9*  ALKPHOS 115  BILITOT 1.6*  PROT 7.6  ALBUMIN 3.1*   No results for input(s): LIPASE, AMYLASE in the last 168 hours. No results for input(s): AMMONIA in the last 168 hours. Coagulation Profile:  Recent Labs Lab 10/05/16 0931  INR 1.21   Cardiac Enzymes: No results for input(s): CKTOTAL, CKMB, CKMBINDEX, TROPONINI in the last 168 hours. BNP (last 3 results) No results for input(s): PROBNP in the last 8760 hours. HbA1C: No results for input(s): HGBA1C in the last 72 hours. CBG: No results for input(s): GLUCAP in the last 168 hours. Lipid Profile: No results for input(s): CHOL, HDL, LDLCALC, TRIG, CHOLHDL, LDLDIRECT in the last 72 hours. Thyroid Function Tests: No results for input(s): TSH, T4TOTAL, FREET4, T3FREE, THYROIDAB in the last 72 hours. Anemia Panel: No results for input(s): VITAMINB12, FOLATE, FERRITIN, TIBC, IRON, RETICCTPCT in the last 72 hours. Urine analysis:      Component Value Date/Time   COLORURINE YELLOW 10/05/2016 1028   APPEARANCEUR TURBID (A) 10/05/2016 1028   APPEARANCEUR Cloudy (A)  02/25/2016 1548   LABSPEC 1.006 10/05/2016 1028   LABSPEC 1.017 10/30/2014 0830   PHURINE 5.0 10/05/2016 1028   GLUCOSEU 150 (A) 10/05/2016 1028   GLUCOSEU Negative 10/30/2014 0830   HGBUR MODERATE (A) 10/05/2016 1028   BILIRUBINUR NEGATIVE 10/05/2016 1028   BILIRUBINUR Negative 02/25/2016 1548   BILIRUBINUR Negative 10/30/2014 0830   KETONESUR NEGATIVE 10/05/2016 1028   PROTEINUR 100 (A) 10/05/2016 1028   NITRITE NEGATIVE 10/05/2016 1028   LEUKOCYTESUR LARGE (A) 10/05/2016 1028   LEUKOCYTESUR 3+ (A) 02/25/2016 1548   LEUKOCYTESUR 2+ 10/30/2014 0830    Creatinine Clearance: Estimated Creatinine Clearance: 56.7 mL/min (by C-G formula based on SCr of 1.05 mg/dL (H)).  Sepsis Labs: @LABRCNTIP (procalcitonin:4,lacticidven:4) ) Recent Results (from the past 240 hour(s))  Culture, blood (Routine x 2)     Status: None (Preliminary result)   Collection Time: 10/05/16  9:31 AM  Result Value Ref Range Status   Specimen Description BLOOD RIGHT ANTECUBITAL  Final   Special Requests BOTTLES DRAWN AEROBIC AND ANAEROBIC  5CC  Final   Culture NO GROWTH <12 HOURS  Final   Report Status PENDING  Incomplete  Culture, blood (Routine x 2)     Status: None (Preliminary result)   Collection Time: 10/05/16 10:10 AM  Result Value Ref Range Status   Specimen Description BLOOD RIGHT HAND  Final   Special Requests BOTTLES DRAWN AEROBIC AND ANAEROBIC 5CC  Final   Culture NO GROWTH <12 HOURS  Final   Report Status PENDING  Incomplete     Radiological Exams on Admission: Dg Chest Portable 1 View  Result Date: 10/05/2016 CLINICAL DATA:  Fever.  Unresponsive.  Lethargy. EXAM: PORTABLE CHEST 1 VIEW COMPARISON:  08/24/2016. FINDINGS: Normal heart size. Aortic atherosclerosis is identified. No pleural effusion or edema. No airspace opacities. IMPRESSION: 1. No acute  cardiopulmonary abnormalities. 2. Aortic atherosclerosis. Electronically Signed   By: Signa Kell M.D.   On: 10/05/2016 09:57    EKG: Independently reviewed. Sinus tachycardia  Assessment/Plan Principal Problem:   Sepsis (HCC) Active Problems:   GERD (gastroesophageal reflux disease)   Essential hypertension   Parkinson's disease (HCC)   Diabetes mellitus with neuropathy (HCC)   Hyperlipidemia   UTI (urinary tract infection)   Morbid obesity (HCC)   Acute encephalopathy   #1. Sepsis secondary to urinary tract infection recurrent. Lactic acid elevated leukocytosis greater than 18 max temperature 102.2 rectally tachycardia tachypnea acute encephalopathy. Blood pressure stable. She received fluid resuscitation and IV antibiotics. -Admit to telemetry -Blood culture - follow Urine culture -cefepime per protocol -Procalcitonin -IV fluids -Track lactic acid -Monitor closely  #2. Urinary tract infection. Recurrent. Patient has a history of pansensitive Escherichia coli. -Follow urine culture -cefipime per protocol -Monitor urine output -IV fluids  3. Acute encephalopathy related to above. History of same. She is more alert on admission but slow to respond. With daughter who indicates patient's baseline is interactive and quite verbal able to make her wants and needs known -tylenol for fever -anti-biotics for UTI -Monitor closely if no improvement consider imaging -Continue home meds  #4. Diabetes. Controlled. Serum glucose 323. Home meds include Lantus, Neurontin, Amaryl Anion gap 12 -continue home lantus. 2 months ago hemoglobin A1c 9.8. -Continue home Lantus -Hold oral agents -Sliding scale insulin for optimal control -Carb modified diet  5. Hypertension. Fair control in the emergency department. I medications include Lopressor -Continue Lopressor -Monitor closely  #6. Bipolar disorder. Appears stable at baseline -continue home meds -monitor   DVT prophylaxis:  lovenox  Code Status: full  Family Communication: daughter on phone  Disposition Plan: back to [facility]  Consults called: none  Admission status: inpatient    Gwenyth Bender MD [facility]  If 7PM-7AM, please contact night-coverage [email or web address] Password [email or web address]  10/05/2016, 12:28 PM

## 2016-10-05 NOTE — ED Notes (Signed)
Spoke with patient placement regarding delay in bed assignment.  Pt is next to get a bed and is suppose to [facility].

## 2016-10-05 NOTE — ED Notes (Signed)
Paged Black to [phone], RCharity fundraiser

## 2016-10-05 NOTE — ED Notes (Signed)
Placed patient on the monitor did ekg shown to [employer]t [employer] cleaned patient up pt is resting

## 2016-10-05 NOTE — ED Notes (Signed)
Attempted to wet pt mouth to start swallow screen to give pt meds. pt said "no." then tried to give some apple sauce pt said "no" again. Therefore unable to give pt oral meds or do swallow screen

## 2016-10-05 NOTE — Care Management Note (Signed)
Case Management Note  Patient Details  Name: Maureen Taylor MRN: [id number] Date of Birth: [birth year]  Subjective/Objective:                  [facility]. SNF report: at 0600 am pt was unresponsive, lethargic and warm to touch.  Paramedics report:  pt as responding only to sternal rub. In ED: pt fighting staff when they were assessing her. Pt temp 102.2 rectal.   Action/Plan: Follow for disposition needs. /Admit status INPATIENT (Encephalopathy likely secondary to infectious process); anticipate discharge RETURN TO SNF.    Expected Discharge Date:  10/08/16               Expected Discharge Plan:  Skilled Nursing Facility  In-House Referral:  Clinical Social Work  Discharge planning Services  NA  Status of Service:  In process, will continue to follow  If discussed at Long Length of Stay Meetings, dates discussed:    Additional Comments:  Oletta CohnWood, Malynn Lucy, RN 10/05/2016, 3:00 PM

## 2016-10-06 DIAGNOSIS — N3000 Acute cystitis without hematuria: Secondary | ICD-10-CM

## 2016-10-06 DIAGNOSIS — E1140 Type 2 diabetes mellitus with diabetic neuropathy, unspecified: Secondary | ICD-10-CM

## 2016-10-06 DIAGNOSIS — A4151 Sepsis due to Escherichia coli [E. coli]: Principal | ICD-10-CM

## 2016-10-06 DIAGNOSIS — R7881 Bacteremia: Secondary | ICD-10-CM

## 2016-10-06 DIAGNOSIS — G20 Parkinson's disease: Secondary | ICD-10-CM

## 2016-10-06 DIAGNOSIS — E785 Hyperlipidemia, unspecified: Secondary | ICD-10-CM

## 2016-10-06 DIAGNOSIS — Z794 Long term (current) use of insulin: Secondary | ICD-10-CM

## 2016-10-06 DIAGNOSIS — I10 Essential (primary) hypertension: Secondary | ICD-10-CM

## 2016-10-06 DIAGNOSIS — G9340 Encephalopathy, unspecified: Secondary | ICD-10-CM

## 2016-10-06 LAB — CBC
HCT: 36.8 % (ref 36.0–46.0)
Hemoglobin: 12.3 g/dL (ref 12.0–15.0)
MCH: 30.8 pg (ref 26.0–34.0)
MCHC: 33.4 g/dL (ref 30.0–36.0)
MCV: 92.2 fL (ref 78.0–100.0)
Platelets: 223 10*3/uL (ref 150–400)
RBC: 3.99 MIL/uL (ref 3.87–5.11)
RDW: 14.5 % (ref 11.5–15.5)
WBC: 14.4 10*3/uL — AB (ref 4.0–10.5)

## 2016-10-06 LAB — BLOOD CULTURE ID PANEL (REFLEXED)
Acinetobacter baumannii: NOT DETECTED
Candida albicans: NOT DETECTED
Candida glabrata: NOT DETECTED
Candida krusei: NOT DETECTED
Candida parapsilosis: NOT DETECTED
Candida tropicalis: NOT DETECTED
Carbapenem resistance: NOT DETECTED
Enterobacter cloacae complex: NOT DETECTED
Enterococcus species: NOT DETECTED
Escherichia coli: DETECTED — AB
Haemophilus influenzae: NOT DETECTED
Klebsiella oxytoca: NOT DETECTED
Klebsiella pneumoniae: NOT DETECTED
Listeria monocytogenes: NOT DETECTED
Methicillin resistance: NOT DETECTED
Neisseria meningitidis: NOT DETECTED
Proteus species: NOT DETECTED
Pseudomonas aeruginosa: NOT DETECTED
Serratia marcescens: NOT DETECTED
Staphylococcus aureus (BCID): NOT DETECTED
Staphylococcus species: NOT DETECTED
Streptococcus agalactiae: NOT DETECTED
Streptococcus pneumoniae: NOT DETECTED
Streptococcus pyogenes: NOT DETECTED
Streptococcus species: NOT DETECTED
Vancomycin resistance: NOT DETECTED

## 2016-10-06 LAB — GLUCOSE, CAPILLARY
Glucose-Capillary: 163 mg/dL — ABNORMAL HIGH (ref 65–99)
Glucose-Capillary: 163 mg/dL — ABNORMAL HIGH (ref 65–99)
Glucose-Capillary: 166 mg/dL — ABNORMAL HIGH (ref 65–99)
Glucose-Capillary: 222 mg/dL — ABNORMAL HIGH (ref 65–99)

## 2016-10-06 LAB — BASIC METABOLIC PANEL
Anion gap: 7 (ref 5–15)
BUN: 16 mg/dL (ref 6–20)
CHLORIDE: 112 mmol/L — AB (ref 101–111)
CO2: 23 mmol/L (ref 22–32)
CREATININE: 0.9 mg/dL (ref 0.44–1.00)
Calcium: 8.2 mg/dL — ABNORMAL LOW (ref 8.9–10.3)
GFR calc non Af Amer: 59 mL/min — ABNORMAL LOW (ref >60)
Glucose, Bld: 185 mg/dL — ABNORMAL HIGH (ref 65–99)
POTASSIUM: 3.4 mmol/L — AB (ref 3.5–5.1)
SODIUM: 142 mmol/L (ref 135–145)

## 2016-10-06 LAB — HEMOGLOBIN A1C
HEMOGLOBIN A1C: 9.8 % — AB (ref 4.8–5.6)
MEAN PLASMA GLUCOSE: 235 mg/dL

## 2016-10-06 MED ORDER — SODIUM CHLORIDE 0.9 % IV SOLN
INTRAVENOUS | Status: DC
  Administered 2016-10-06: 19:00:00 via INTRAVENOUS

## 2016-10-06 MED ORDER — SODIUM CHLORIDE 0.9 % IV SOLN
1.0000 g | Freq: Three times a day (TID) | INTRAVENOUS | Status: DC
  Administered 2016-10-06 – 2016-10-07 (×3): 1 g via INTRAVENOUS
  Filled 2016-10-06 (×3): qty 1

## 2016-10-06 MED ORDER — POTASSIUM CHLORIDE CRYS ER 20 MEQ PO TBCR
40.0000 meq | EXTENDED_RELEASE_TABLET | Freq: Once | ORAL | Status: AC
  Administered 2016-10-06: 40 meq via ORAL
  Filled 2016-10-06: qty 2

## 2016-10-06 MED ORDER — INSULIN GLARGINE 100 UNIT/ML ~~LOC~~ SOLN
35.0000 [IU] | Freq: Every day | SUBCUTANEOUS | Status: DC
  Administered 2016-10-06 – 2016-10-09 (×4): 35 [IU] via SUBCUTANEOUS
  Filled 2016-10-06 (×4): qty 0.35

## 2016-10-06 MED ORDER — DEXTROSE 5 % IV SOLN
2.0000 g | INTRAVENOUS | Status: DC
  Administered 2016-10-06: 2 g via INTRAVENOUS
  Filled 2016-10-06: qty 2

## 2016-10-06 NOTE — Progress Notes (Signed)
PHARMACY - PHYSICIAN COMMUNICATION CRITICAL VALUE ALERT - BLOOD CULTURE IDENTIFICATION (BCID)  Results for orders placed or performed during the hospital encounter of 10/05/16  Blood Culture ID Panel (Reflexed) (Collected: 10/05/2016  9:31 AM)  Result Value Ref Range   Enterococcus species NOT DETECTED NOT DETECTED   Vancomycin resistance NOT DETECTED NOT DETECTED   Listeria monocytogenes NOT DETECTED NOT DETECTED   Staphylococcus species NOT DETECTED NOT DETECTED   Staphylococcus aureus NOT DETECTED NOT DETECTED   Methicillin resistance NOT DETECTED NOT DETECTED   Streptococcus species NOT DETECTED NOT DETECTED   Streptococcus agalactiae NOT DETECTED NOT DETECTED   Streptococcus pneumoniae NOT DETECTED NOT DETECTED   Streptococcus pyogenes NOT DETECTED NOT DETECTED   Acinetobacter baumannii NOT DETECTED NOT DETECTED   Enterobacteriaceae species (A) NOT DETECTED    CRITICAL RESULT CALLED TO, READ BACK BY AND VERIFIED WITH:   Enterobacter cloacae complex NOT DETECTED NOT DETECTED   Escherichia coli DETECTED (A) NOT DETECTED   Klebsiella oxytoca NOT DETECTED NOT DETECTED   Klebsiella pneumoniae NOT DETECTED NOT DETECTED   Proteus species NOT DETECTED NOT DETECTED   Serratia marcescens NOT DETECTED NOT DETECTED   Carbapenem resistance NOT DETECTED NOT DETECTED   Haemophilus influenzae NOT DETECTED NOT DETECTED   Neisseria meningitidis NOT DETECTED NOT DETECTED   Pseudomonas aeruginosa NOT DETECTED NOT DETECTED   Candida albicans NOT DETECTED NOT DETECTED   Candida glabrata NOT DETECTED NOT DETECTED   Candida krusei NOT DETECTED NOT DETECTED   Candida parapsilosis NOT DETECTED NOT DETECTED   Candida tropicalis NOT DETECTED NOT DETECTED    Name of physician (or Provider) Contacted: Maren ReamerKaren Kirby NP  Changes to prescribed antibiotics required: Change to rocephin 2gm IV q24hr.   Harland GermanAndrew Ryot Burrous, Pharm D 10/06/2016 2:03 AM

## 2016-10-06 NOTE — Progress Notes (Addendum)
PROGRESS NOTE  Maureen Taylor  MRN:[id number] DOB: 10/29/1934  DOA: 10/05/2016 PCP: PANDEY, MAHIMA, MD   Brief Narrative:  81-year-old female patient with history of GERD, HLD, HTN, MDD/bipolar disorder, Parkinson's disease, DM 2 with neuropathy, UTIs, presented to [facility] ED from [facility] on 10/05/16 due to altered mental status and fever. At the nursing home, she was flushed, lethargic and warm to touch. EMS was called and she would not respond to sternal rub however this improved while transporting her to ED. Hospitalized a month ago after a fall related to altered mental status from UTI. In the ED, temperature 102, tachycardic, tachypnea, and admitted for sepsis secondary to UTI and acute encephalopathy related to sepsis. Clinically improving.   Assessment & Plan:   Principal Problem:   Sepsis (HCC) Active Problems:   GERD (gastroesophageal reflux disease)   Essential hypertension   Parkinson's disease (HCC)   Diabetes mellitus with neuropathy (HCC)   Hyperlipidemia   UTI (urinary tract infection)   Morbid obesity (HCC)   Acute encephalopathy   1. Sepsis secondary to Escherichia coli UTI (recurrent UTIs) and bacteremia: Met sepsis criteria on admission. Treated per sepsis protocol with IV fluids and IV cefepime. Sepsis physiology have improved/resolved. Continue gentle IV fluids and narrow antibiotics pending sensitivity results. Blood cultures 1, BCID and urine culture confirms Escherichia coli. Pharmacy had discussed with admitting team and had changed antibiotics from cefepime to IV ceftriaxone. However upon reviewing chart, noted ESBL Escherichia coli on urine culture in 2016. Discussed with pharmacist and agree on switching to Merrem pending sensitivities. 2. Acute encephalopathy: Secondary to sepsis. Improving.  3. Type II DM, uncontrolled with neuropathy: Discussed with diabetes coordinator. Continue sliding scale but resume reduced dose of Lantus. Adjust insulin's as needed. A1c  9.8 suggesting poor control. Oral medications held. 4. Essential hypertension: Controlled. Continue metoprolol & lisinopril. 5. MDD/bipolar disorder: To be assessed when mental status improves. Is on Abilify and duloxetine. If mental status declined so does not continue to improve then consider holding these medications temporarily. 6. Parkinson's disease: Continue Sinemet. 7. Hyperlipidemia: Statins. 8. Hypokalemia: Replace and follow.   DVT prophylaxis: Lovenox Code Status: Full Family Communication: None at bedside Disposition Plan: DC [facility] when medically stable   Consultants:   None  Procedures:   None  Antimicrobials:   IV cefepime    Subjective: Seen this morning. Slightly somnolent but easily arousable and oriented to self, partly to place and time. Denied complaints. Denied pain or dyspnea. As per RN, progressively became more alert during the course of this morning, ate half of her lunch and as per family, mental status has improved.  Objective:  Vitals:   10/05/16 2121 10/06/16 0259 10/06/16 0602 10/06/16 1507  BP: 137/64  136/64 (!) 125/56  Pulse: (!) 109  (!) 107 93  Resp: 20  18 (!) 1  Temp: 98.1 F (36.7 C)  98.3 F (36.8 C) 98.6 F (37 C)  TempSrc:    Oral  SpO2: 95%  93% 99%  Weight: 109.9 kg (242 lb 3.2 oz) 109.8 kg (242 lb 1 oz)    Height:        Intake/Output Summary (Last 24 hours) at 10/06/16 1634 Last data filed at 10/06/16 0300  Gross per 24 hour  Intake               50 ml  Output                0 ml  Net                 50 ml   Filed Weights   10/05/16 0918 10/05/16 2121 10/06/16 0259  Weight: 110.7 kg (244 lb) 109.9 kg (242 lb 3.2 oz) 109.8 kg (242 lb 1 oz)    Examination:  General exam: Pleasant elderly female lying comfortably propped up in bed. Respiratory system: Clear to auscultation/poor inspiratory effort.  Cardiovascular system: S1 & S2 heard, RRR. No JVD, murmurs, rubs, gallops or clicks. No pedal edema. Telemetry:  Sinus tachycardia in the low 100s. Gastrointestinal system: Abdomen is nondistended, soft and nontender. No organomegaly or masses felt. Normal bowel sounds heard. Central nervous system: Somnolent but easily arousable and oriented to self, partly to place and time. No focal neurological deficits. Extremities: Moving all extremities symmetrically. Skin: No rashes, lesions or ulcers Psychiatry: Judgement and insight impaired. Mood & affect appropriate.     Data Reviewed: I have personally reviewed following labs and imaging studies  CBC:  Recent Labs Lab 10/05/16 0931 10/06/16 0644  WBC 18.4* 14.4*  NEUTROABS 15.3*  --   HGB 16.3* 12.3  HCT 48.2* 36.8  MCV 91.1 92.2  PLT 265 223   Basic Metabolic Panel:  Recent Labs Lab 10/05/16 0931 10/06/16 0644  NA 137 142  K 4.1 3.4*  CL 101 112*  CO2 24 23  GLUCOSE 323* 185*  BUN 14 16  CREATININE 1.05* 0.90  CALCIUM 9.4 8.2*   GFR: Estimated Creatinine Clearance: 65.9 mL/min (by C-G formula based on SCr of 0.9 mg/dL). Liver Function Tests:  Recent Labs Lab 10/05/16 0931  AST 21  ALT 9*  ALKPHOS 115  BILITOT 1.6*  PROT 7.6  ALBUMIN 3.1*   No results for input(s): LIPASE, AMYLASE in the last 168 hours. No results for input(s): AMMONIA in the last 168 hours. Coagulation Profile:  Recent Labs Lab 10/05/16 0931  INR 1.21   Cardiac Enzymes: No results for input(s): CKTOTAL, CKMB, CKMBINDEX, TROPONINI in the last 168 hours. BNP (last 3 results) No results for input(s): PROBNP in the last 8760 hours. HbA1C:  Recent Labs  10/05/16 0931  HGBA1C 9.8*   CBG:  Recent Labs Lab 10/05/16 1236 10/05/16 1713 10/05/16 1911 10/06/16 0818 10/06/16 1156  GLUCAP 304* 301* 249* 166* 163*   Lipid Profile: No results for input(s): CHOL, HDL, LDLCALC, TRIG, CHOLHDL, LDLDIRECT in the last 72 hours. Thyroid Function Tests: No results for input(s): TSH, T4TOTAL, FREET4, T3FREE, THYROIDAB in the last 72 hours. Anemia  Panel: No results for input(s): VITAMINB12, FOLATE, FERRITIN, TIBC, IRON, RETICCTPCT in the last 72 hours.  Sepsis Labs:  Recent Labs Lab 10/05/16 0931 10/05/16 0948 10/05/16 1316 10/05/16 1338 10/05/16 1707 10/06/16 0644  PROCALCITON  --   --  2.86  --   --   --   WBC 18.4*  --   --   --   --  14.4*  LATICACIDVEN  --  3.86* 2.6* 3.05* 2.9*  --     Recent Results (from the past 240 hour(s))  Culture, blood (Routine x 2)     Status: Abnormal (Preliminary result)   Collection Time: 10/05/16  9:31 AM  Result Value Ref Range Status   Specimen Description BLOOD RIGHT ANTECUBITAL  Final   Special Requests BOTTLES DRAWN AEROBIC AND ANAEROBIC  5CC  Final   Culture  Setup Time   Final    GRAM NEGATIVE RODS IN BOTH AEROBIC AND ANAEROBIC BOTTLES CRITICAL RESULT CALLED TO, READ BACK BY AND VERIFIED WITH: A MEYER,PHARMD @0143 10/06/16 MKELLY,MLT    Culture ESCHERICHIA COLI SUSCEPTIBILITIES TO   FOLLOW  (A)  Final   Report Status PENDING  Incomplete  Blood Culture ID Panel (Reflexed)     Status: Abnormal   Collection Time: 10/05/16  9:31 AM  Result Value Ref Range Status   Enterococcus species NOT DETECTED NOT DETECTED Final   Vancomycin resistance NOT DETECTED NOT DETECTED Final   Listeria monocytogenes NOT DETECTED NOT DETECTED Final   Staphylococcus species NOT DETECTED NOT DETECTED Final   Staphylococcus aureus NOT DETECTED NOT DETECTED Final   Methicillin resistance NOT DETECTED NOT DETECTED Final   Streptococcus species NOT DETECTED NOT DETECTED Final   Streptococcus agalactiae NOT DETECTED NOT DETECTED Final   Streptococcus pneumoniae NOT DETECTED NOT DETECTED Final   Streptococcus pyogenes NOT DETECTED NOT DETECTED Final   Acinetobacter baumannii NOT DETECTED NOT DETECTED Final   Enterobacteriaceae species (A) NOT DETECTED Final    CRITICAL RESULT CALLED TO, READ BACK BY AND VERIFIED WITH:    Comment: A MEYER,PHARMD _0  10/06/16 MKELLY,MLT DETECTED DETECTED     Enterobacter cloacae complex NOT DETECTED NOT DETECTED Final   Escherichia coli DETECTED (A) NOT DETECTED Final    Comment: CRITICAL RESULT CALLED TO, READ BACK BY AND VERIFIED WITH: A MEYER,PHARMD _1  10/06/16 MKELLY,MLT    Klebsiella oxytoca NOT DETECTED NOT DETECTED Final   Klebsiella pneumoniae NOT DETECTED NOT DETECTED Final   Proteus species NOT DETECTED NOT DETECTED Final   Serratia marcescens NOT DETECTED NOT DETECTED Final   Carbapenem resistance NOT DETECTED NOT DETECTED Final   Haemophilus influenzae NOT DETECTED NOT DETECTED Final   Neisseria meningitidis NOT DETECTED NOT DETECTED Final   Pseudomonas aeruginosa NOT DETECTED NOT DETECTED Final   Candida albicans NOT DETECTED NOT DETECTED Final   Candida glabrata NOT DETECTED NOT DETECTED Final   Candida krusei NOT DETECTED NOT DETECTED Final   Candida parapsilosis NOT DETECTED NOT DETECTED Final   Candida tropicalis NOT DETECTED NOT DETECTED Final  Culture, blood (Routine x 2)     Status: None (Preliminary result)   Collection Time: 10/05/16 10:10 AM  Result Value Ref Range Status   Specimen Description BLOOD RIGHT HAND  Final   Special Requests BOTTLES DRAWN AEROBIC AND ANAEROBIC 5CC  Final   Culture NO GROWTH 1 DAY  Final   Report Status PENDING  Incomplete  Urine culture     Status: Abnormal (Preliminary result)   Collection Time: 10/05/16 10:28 AM  Result Value Ref Range Status   Specimen Description URINE, CATHETERIZED  Final   Special Requests NONE  Final   Culture >=100,000 COLONIES/mL ESCHERICHIA COLI (A)  Final   Report Status PENDING  Incomplete         Radiology Studies: Dg Chest Portable 1 View  Result Date: 10/05/2016 CLINICAL DATA:  Fever.  Unresponsive.  Lethargy. EXAM: PORTABLE CHEST 1 VIEW COMPARISON:  08/24/2016. FINDINGS: Normal heart size. Aortic atherosclerosis is identified. No pleural effusion or edema. No airspace opacities. IMPRESSION: 1. No acute cardiopulmonary abnormalities. 2. Aortic  atherosclerosis. Electronically Signed   By: Kerby Moors M.D.   On: 10/05/2016 09:57        Scheduled Meds: . ARIPiprazole  2 mg Oral Daily  . atorvastatin  10 mg Oral Daily  . carbidopa-levodopa  1 tablet Oral BID  . cefTRIAXone (ROCEPHIN)  IV  2 g Intravenous Q24H  . [START ON 10/07/2016] conjugated estrogens  1 Applicatorful Vaginal Q M,W,F  . DULoxetine  60 mg Oral Daily  . enoxaparin (LOVENOX) injection  40 mg Subcutaneous Q24H  . insulin aspart  0-15 Units Subcutaneous TID WC  . insulin aspart  0-5 Units Subcutaneous QHS  . insulin glargine  35 Units Subcutaneous Daily  . lisinopril  20 mg Oral Daily  . magnesium oxide  400 mg Oral Daily  . metoprolol tartrate  25 mg Oral BID  . senna-docusate  2 tablet Oral BID  . sodium chloride flush  3 mL Intravenous Q12H   Continuous Infusions:   LOS: 1 day     ,, MD [facility] Pager [phone]  If 7PM-7AM, please contact night-coverage [email or web address] Password [email or web address] 10/06/2016, 4:34 PM   

## 2016-10-06 NOTE — Progress Notes (Addendum)
Inpatient Diabetes Program Recommendations  AACE/ADA: New Consensus Statement on Inpatient Glycemic Control (2015)  Target Ranges:  Prepandial:   less than 140 mg/dL      Peak postprandial:   less than 180 mg/dL (1-2 hours)      Critically ill patients:  140 - 180 mg/dL   Lab Results  Component Value Date   GLUCAP 163 (H) 10/06/2016   HGBA1C 9.8 (H) 10/05/2016    Review of Glycemic Control:  Results for Maureen Taylor, Maureen Taylor (MRN [id number]) as of 10/06/2016 14:33  Ref. Range 10/05/2016 12:36 10/05/2016 17:13 10/05/2016 19:11 10/06/2016 08:18 10/06/2016 11:56  Glucose-Capillary Latest Ref Range: 65 - 99 mg/dL 784304 (H) 696301 (H) 295249 (H) 166 (H) 163 (H)   Diabetes history: Type 2 diabetes Outpatient Diabetes medications: Amaryl 4 mg daily, Lantus 35 units bid, Humalog 8-12 units tid with meals Current orders for Inpatient glycemic control:  Novolog moderate tid with meals and HS  Inpatient Diabetes Program Recommendations:   Spoke with patient and family at bedside briefly.  Patient is from NH and is on insulin at the facility. Called and discussed patient with MD.  Orders received to restart Lantus 35 units daily (1/2 of patient's home dose).   Thanks, Beryl MeagerJenny Germany Chelf, RN, BC-ADM Inpatient Diabetes Coordinator Pager [phone] (8a-5p)

## 2016-10-06 NOTE — Evaluation (Signed)
Clinical/Bedside Swallow Evaluation Patient Details  Name: Maureen Taylor Phimmasone MRN: [id number] Date of Birth: [birth year]  Today's Date: 10/06/2016 Time: SLP Start Time (ACUTE ONLY): 1545 SLP Stop Time (ACUTE ONLY): 1559 SLP Time Calculation (min) (ACUTE ONLY): 14 min  Past Medical History:  Past Medical History:  Diagnosis Date  . Allergic rhinitis   . Ascorbic acid deficiency   . Bipolar affective disorder (HCC)   . Candidiasis   . Charcot's joint   . Chronic constipation   . Dysuria   . GERD (gastroesophageal reflux disease)   . History of falling   . HLD (hyperlipidemia)   . HTN (hypertension)   . Magnesium deficiency   . Major depressive disorder   . Mixed incontinence   . Muscle weakness   . Neuropathic pain   . Neuropathy (HCC)   . OAB (overactive bladder)   . Obesity   . Oral pain   . Parkinson disease (HCC)   . Pneumonia   . Protein calorie malnutrition (HCC)   . Rosacea   . Slow transit constipation   . Type 2 diabetes mellitus with diabetic polyneuropathy, with long-term current use of insulin (HCC)   . Urinary tract infection without hematuria   . Vaginal atrophy   . Vitamin deficiency    Past Surgical History:  Past Surgical History:  Procedure Laterality Date  . ABDOMINAL HYSTERECTOMY    . CERVICAL CONIZATION W/BX    . CHOLECYSTECTOMY    . REVISION TOTAL KNEE ARTHROPLASTY Right    HPI:  Maureen Taylor Macek is a 82 y.o. female with medical history significant for Parkinson's disease, diabetes, hypertension, currently UTIs, bipolar [facility] chief complaint acute encephalopathy and fever. Initial evaluation reveals sepsis likely related to urinary tract infection   Assessment / Plan / Recommendation Clinical Impression  Pt admitted with urosepsis and altered mental status. She failed swallow screen with nurse. Pt began a diet today of regular diet with thin liquids and nurse reported pt had no problems swallowing during  meal time or medication administration. Pt had no signs or symptoms of aspiration across all consistencies (thin liquids, puree, cracker). Recommend patient continue regular diet with thin liquids. No follow up with speech therapy is recommended.     Aspiration Risk  No limitations    Diet Recommendation Regular;Thin liquid   Liquid Administration via: Cup;Straw Medication Administration: Whole meds with liquid Supervision: Staff to assist with self feeding    Other  Recommendations Oral Care Recommendations: Oral care BID   Follow up Recommendations None        Swallow Study   General Date of Onset: 10/06/15 HPI: Maureen Taylor Smullen is a 82 y.o. female with medical history significant for Parkinson's disease, diabetes, hypertension, currently UTIs, bipolar [facility] chief complaint acute encephalopathy and fever. Initial evaluation reveals sepsis likely related to urinary tract infection Type of Study: Bedside Swallow Evaluation Diet Prior to this Study: Regular;Thin liquids Temperature Spikes Noted: Yes Respiratory Status: Nasal cannula History of Recent Intubation: No Behavior/Cognition: Alert;Cooperative Oral Cavity Assessment: Within Functional Limits Oral Care Completed by SLP: No Oral Cavity - Dentition: Adequate natural dentition Vision: Functional for self-feeding Self-Feeding Abilities: Needs assist Patient Positioning: Upright in bed Baseline Vocal Quality: Normal Volitional Cough: Strong Volitional Swallow: Able to elicit    Oral/Motor/Sensory Function Overall Oral Motor/Sensory Function: Within functional limits   Ice Chips Ice chips: Not tested   Thin Liquid Thin Liquid: Within functional limits Presentation: Cup;Straw  Nectar Thick Nectar Thick Liquid: Not tested   Honey Thick Honey Thick Liquid: Not tested   Puree Puree: Within functional limits Presentation: Spoon;Self Fed   Solid   GO   Solid: Within functional  limits Presentation: Self Fed    Functional Assessment Tool Used: Bedside Swallow Evaluation  Lindalou Hose Alan Riles, MA, CCC-SLP 10/06/2016 4:20 PM

## 2016-10-06 NOTE — Progress Notes (Signed)
Pharmacy Antibiotic Note  Maureen Taylor is a 82 y.o. female admitted on 10/05/2016 with UTI and history of ESBL Ecoli.  Pharmacy has been consulted for meropenem dosing.  Plan: After discussion with Dr. Waymon AmatoHongalgi, antibiotics will be changed meropenem 1g IV every 8 hours.  Follow-up culture results, clinical status, and renal function.   Height: 5\' 8"  (172.7 cm) Weight: 242 lb 1 oz (109.8 kg) IBW/kg (Calculated) : 63.9  Temp (24hrs), Avg:98.5 F (36.9 C), Min:98.1 F (36.7 C), Max:98.8 F (37.1 C)   Recent Labs Lab 10/05/16 0931 10/05/16 0948 10/05/16 1316 10/05/16 1338 10/05/16 1707 10/06/16 0644  WBC 18.4*  --   --   --   --  14.4*  CREATININE 1.05*  --   --   --   --  0.90  LATICACIDVEN  --  3.86* 2.6* 3.05* 2.9*  --     Estimated Creatinine Clearance: 65.9 mL/min (by C-G formula based on SCr of 0.9 mg/dL).    Allergies  Allergen Reactions  . Codeine Itching  . Morphine And Related Itching  . Indocin [Indomethacin] Rash    Antimicrobials this admission: Cefepime 1/10 >> 1/10 Ceftriaxone 1/10 >> 1/11 Merrem 1/11 >>  Dose adjustments this admission:  Microbiology results: 1/10 Blood >> Ecoli (susceptibilities pending) 1/10 Urine >100K Ecoli (susceptibilities pending)  Thank you for allowing pharmacy to be a part of this patient's care.  Link SnufferJessica Macon Lesesne, PharmD, BCPS Clinical Pharmacist [phone] 10/06/2016 4:55 PM

## 2016-10-07 LAB — BASIC METABOLIC PANEL
ANION GAP: 8 (ref 5–15)
BUN: 14 mg/dL (ref 6–20)
CHLORIDE: 110 mmol/L (ref 101–111)
CO2: 23 mmol/L (ref 22–32)
Calcium: 8.3 mg/dL — ABNORMAL LOW (ref 8.9–10.3)
Creatinine, Ser: 0.92 mg/dL (ref 0.44–1.00)
GFR calc Af Amer: >60 mL/min (ref >60)
GFR, EST NON AFRICAN AMERICAN: 58 mL/min — AB (ref >60)
Glucose, Bld: 132 mg/dL — ABNORMAL HIGH (ref 65–99)
POTASSIUM: 3.4 mmol/L — AB (ref 3.5–5.1)
SODIUM: 141 mmol/L (ref 135–145)

## 2016-10-07 LAB — CBC
HCT: 35.4 % — ABNORMAL LOW (ref 36.0–46.0)
HEMOGLOBIN: 11.7 g/dL — AB (ref 12.0–15.0)
MCH: 30.9 pg (ref 26.0–34.0)
MCHC: 33.1 g/dL (ref 30.0–36.0)
MCV: 93.4 fL (ref 78.0–100.0)
Platelets: 232 10*3/uL (ref 150–400)
RBC: 3.79 MIL/uL — AB (ref 3.87–5.11)
RDW: 14.8 % (ref 11.5–15.5)
WBC: 15 10*3/uL — AB (ref 4.0–10.5)

## 2016-10-07 LAB — URINE CULTURE: Culture: >=100000 — AB

## 2016-10-07 LAB — CULTURE, BLOOD (ROUTINE X 2)

## 2016-10-07 LAB — GLUCOSE, CAPILLARY
GLUCOSE-CAPILLARY: 128 mg/dL — AB (ref 65–99)
GLUCOSE-CAPILLARY: 215 mg/dL — AB (ref 65–99)
GLUCOSE-CAPILLARY: 202 mg/dL — AB (ref 65–99)
Glucose-Capillary: 143 mg/dL — ABNORMAL HIGH (ref 65–99)

## 2016-10-07 LAB — MAGNESIUM: MAGNESIUM: 2 mg/dL (ref 1.7–2.4)

## 2016-10-07 MED ORDER — CEFTRIAXONE SODIUM 2 G IJ SOLR
2.0000 g | INTRAMUSCULAR | Status: DC
  Administered 2016-10-07: 2 g via INTRAVENOUS
  Filled 2016-10-07 (×2): qty 2

## 2016-10-07 MED ORDER — POTASSIUM CHLORIDE CRYS ER 20 MEQ PO TBCR
40.0000 meq | EXTENDED_RELEASE_TABLET | ORAL | Status: AC
  Administered 2016-10-07 (×2): 40 meq via ORAL
  Filled 2016-10-07 (×2): qty 2

## 2016-10-07 MED ORDER — ENOXAPARIN SODIUM 60 MG/0.6ML ~~LOC~~ SOLN
55.0000 mg | SUBCUTANEOUS | Status: DC
  Administered 2016-10-07 – 2016-10-09 (×3): 55 mg via SUBCUTANEOUS
  Filled 2016-10-07 (×3): qty 0.6

## 2016-10-07 NOTE — Clinical Social Work Note (Signed)
Clinical Social Work Assessment  Patient Details  Name: Maureen Taylor MRN: [id number] Date of Birth: 05/24/1935  Date of referral:  10/07/16               Reason for consult:  Discharge Planning                Permission sought to share information with:  Facility Medical sales representativeContact Representative, Family Supports Permission granted to share information::  No  Name::     Engineer, manufacturingJoni  Agency::  [facility]  Relationship::  Daughter  Contact Information:     Housing/Transportation Living arrangements for the past 2 months:  Skilled Building surveyorursing Facility Source of Information:  Adult Children Patient Interpreter Needed:  None Criminal Activity/Legal Involvement Pertinent to Current Situation/Hospitalization:  No - Comment as needed Significant Relationships:  Adult Children Lives with:  Facility Resident Do you feel safe going back to the place where you live?  Yes Need for family participation in patient care:  Yes (Comment)  Care giving concerns:  CSW received consult regarding discharge planning. CSW spoke with patient and patient's daughter. [facility] They would like for patient to return there at discharge. CSW to continue to follow patient for needs.    Social Worker assessment / plan:  CSW spoke with patient and patient's daughter, Maureen MeigsJoni, concerning [facility] before returning home.  Employment status:  Retired Database administratornsurance information:  [place], [place] Recommend[place]:  Not assessed at this time Information / Referral to community resources:  Skilled Nursing Facility  Patient/Family's Response to care:  Patient and patient's daughter recognize need for rehab before returning home and are agreeable to returning to [facility].   Patient/Family's Understanding of and Emotional Response to Diagnosis, Current Treatment, and Prognosis:  Patient/family is realistic regarding therapy needs and expressed being hopeful for SNF placement.  Patient's daughter expressed understanding of CSW role and discharge process. No questions/concerns about plan or treatment.    Emotional Assessment Appearance:  Appears stated age Attitude/Demeanor/Rapport:  Unable to Assess Affect (typically observed):  Unable to Assess Orientation:  Oriented to Self, Oriented to Place, Oriented to  Time Alcohol / Substance use:  Not Applicable Psych involvement (Current and /or in the community):  No (Comment)  Discharge Needs  Concerns to be addressed:  Care Coordination Readmission within the last 30 days:  No Current discharge risk:  None Barriers to Discharge:  Continued Medical Work up   Maureen Taylor, LCSWA 10/07/2016, 9:11 AM

## 2016-10-07 NOTE — [place] FL2 (Signed)
[place] MEDICAID FL2 LEVEL OF CARE SCREENING TOOL     IDENTIFICATION  Patient Name: Maureen Taylor Birthdate: 01-30-1935 Sex: female Admission Date (Current Location): 10/05/2016  [facility] [place]   Facility and Address:  [facility], [address], [place], [place] [address]      Provider Number: [id number]  Attending Physician Name and Address:  Elease EtienneAnand D Hongalgi, MD  Relative Name and Phone Number:  Delaney MeigsJoni, daughter, [phone]    Current Level of Care: Hospital Recommended Level of Care: Skilled Nursing Facility Prior Approval Number:    Date Approved/Denied:   PASRR Number: [id number]  Discharge Plan: SNF    Current Diagnoses: Patient Active Problem List   Diagnosis Date Noted  . Sepsis (HCC) 10/05/2016  . Acute encephalopathy 10/05/2016  . Diabetes mellitus with complication (HCC)   . Complicated UTI (urinary tract infection)   . Acute cystitis without hematuria   . Morbid obesity (HCC)   . UTI (urinary tract infection) 08/24/2016  . Altered mental status 08/24/2016  . Constipation 08/24/2016  . Hyperlipidemia 06/05/2015  . Type 2 diabetes mellitus with diabetic polyneuropathy (HCC) 06/05/2015  . Vitamin D deficiency 06/05/2015  . Recurrent UTI 04/17/2015  . Vaginal atrophy 04/17/2015  . Incontinence 04/17/2015  . OAB (overactive bladder) 03/03/2015  . Bipolar depression (HCC) 01/05/2015  . GERD (gastroesophageal reflux disease) 01/05/2015  . Allergic rhinitis 01/05/2015  . Essential hypertension 01/05/2015  . Candida infection 01/05/2015  . Parkinson's disease (HCC) 01/05/2015  . Neuropathy (HCC) 01/05/2015  . Diabetes mellitus with neuropathy (HCC) 01/05/2015    Orientation RESPIRATION BLADDER Height & Weight     Self, Situation, Place  Normal Continent Weight: 109.6 kg (241 lb 11.2 oz) Height:  5\' 8"  (172.7 cm)  BEHAVIORAL SYMPTOMS/MOOD NEUROLOGICAL BOWEL NUTRITION STATUS      Continent Diet  (Please see DC Summary)  AMBULATORY STATUS COMMUNICATION OF NEEDS Skin   Extensive Assist Verbally Normal                       Personal Care Assistance Level of Assistance  Bathing, Feeding, Dressing Bathing Assistance: Maximum assistance Feeding assistance: Limited assistance Dressing Assistance: Limited assistance     Functional Limitations Info             SPECIAL CARE FACTORS FREQUENCY  PT (By licensed PT)                    Contractures      Additional Factors Info  Code Status, Allergies, Psychotropic, Insulin Sliding Scale Code Status Info: Full Allergies Info: Codeine, Morphine And Related, Indocin Indomethacin Psychotropic Info: Cymbalta Insulin Sliding Scale Info: Codeine, Morphine And Related, Indocin Indomethacin       Current Medications (10/07/2016):  This is the current hospital active medication list Current Facility-Administered Medications  Medication Dose Route Frequency Provider Last Rate Last Dose  . acetaminophen (TYLENOL) tablet 650 mg  650 mg Oral Q6H PRN Gwenyth BenderKaren M Black, NP   650 mg at 10/06/16 1844   Or  . acetaminophen (TYLENOL) suppository 650 mg  650 mg Rectal Q6H PRN Gwenyth BenderKaren M Black, NP      . albuterol (PROVENTIL) (2.5 MG/3ML) 0.083% nebulizer solution 2.5 mg  2.5 mg Nebulization Q4H PRN Ozella Rocksavid J Merrell, MD   2.5 mg at 10/07/16 [phone]  . ARIPiprazole (ABILIFY) tablet 2 mg  2 mg Oral Daily Gwenyth BenderKaren M Black, NP   2 mg at 10/06/16  1045  . atorvastatin (LIPITOR) tablet 10 mg  10 mg Oral Daily Gwenyth Bender, NP   10 mg at 10/06/16 1043  . bisacodyl (DULCOLAX) EC tablet 5 mg  5 mg Oral Daily PRN Gwenyth Bender, NP      . bisacodyl (DULCOLAX) suppository 10 mg  10 mg Rectal Daily PRN Gwenyth Bender, NP      . carbidopa-levodopa (SINEMET IR) 25-100 MG per tablet immediate release 1 tablet  1 tablet Oral BID Ozella Rocks, MD   1 tablet at 10/06/16 2340  . conjugated estrogens (PREMARIN) vaginal cream 1 Applicatorful  1 Applicatorful Vaginal Q  M,W,F Ozella Rocks, MD      . DULoxetine (CYMBALTA) DR capsule 60 mg  60 mg Oral Daily Gwenyth Bender, NP   60 mg at 10/06/16 1044  . enoxaparin (LOVENOX) injection 55 mg  55 mg Subcutaneous Q24H [employer], RPH      . insulin aspart (novoLOG) injection 0-15 Units  0-15 Units Subcutaneous TID WC Gwenyth Bender, NP   2 Units at 10/07/16 [phone]  . insulin aspart (novoLOG) injection 0-5 Units  0-5 Units Subcutaneous QHS Gwenyth Bender, NP   Stopped at 10/06/16 0039  . insulin glargine (LANTUS) injection 35 Units  35 Units Subcutaneous Daily Elease Etienne, MD   35 Units at 10/06/16 1833  . lisinopril (PRINIVIL,ZESTRIL) tablet 20 mg  20 mg Oral Daily Lesle Chris Black, NP   20 mg at 10/06/16 1044  . magnesium oxide (MAG-OX) tablet 400 mg  400 mg Oral Daily Lesle Chris Black, NP   400 mg at 10/06/16 1043  . meropenem (MERREM) 1 g in sodium chloride 0.9 % 100 mL IVPB  1 g Intravenous Q8H Quenton Fetter, RPH   1 g at 10/07/16 0829  . metoprolol tartrate (LOPRESSOR) tablet 25 mg  25 mg Oral BID Gwenyth Bender, NP   25 mg at 10/06/16 2346  . ondansetron (ZOFRAN) tablet 4 mg  4 mg Oral Q6H PRN Gwenyth Bender, NP       Or  . ondansetron [facility]: [facility] Rhonda W) injection 4 mg  4 mg Intravenous Q6H PRN Gwenyth Bender, NP      . senna-docusate (Senokot-S) tablet 2 tablet  2 tablet Oral BID Gwenyth Bender, NP   2 tablet at 10/06/16 2340  . sodium chloride flush (NS) 0.9 % injection 3 mL  3 mL Intravenous Q12H Gwenyth Bender, NP   3 mL at 10/06/16 2200     Discharge Medications: Please see discharge summary for a list of discharge medications.  Relevant Imaging Results:  Relevant Lab Results:   Additional Information SSN:  [id number]  Mearl Latin, LCSWA

## 2016-10-07 NOTE — Progress Notes (Signed)
PROGRESS NOTE  Maureen Taylor  YIF:[id number] DOB: 18-Apr-1935  DOA: 10/05/2016 PCP: Blanchie Serve, MD   Brief Narrative:  82 year old female patient with history of GERD, HLD, HTN, MDD/bipolar disorder, Parkinson's disease, DM 2 with neuropathy, UTIs, presented to [facility] ED from [facility] on 10/05/16 due to altered mental status and fever. At the nursing home, she was flushed, lethargic and warm to touch. EMS was called and she would not respond to sternal rub however this improved while transporting her to ED. Hospitalized a month ago after a fall related to altered mental status from UTI. In the ED, temperature 102, tachycardic, tachypnea, and admitted for sepsis secondary to UTI and acute encephalopathy related to sepsis. Clinically improving.   Assessment & Plan:   Principal Problem:   Sepsis ([place]) Active Problems:   GERD (gastroesophageal reflux disease)   Essential hypertension   Parkinson's disease ([place])   Diabetes mellitus with neuropathy ([place])   Hyperlipidemia   UTI (urinary tract infection)   Morbid obesity ([place])   Acute encephalopathy   1. Sepsis secondary to Escherichia coli UTI (recurrent UTIs) and bacteremia: Met sepsis criteria on admission. Treated per sepsis protocol with IV fluids. Sepsis physiology have improved/resolved. Blood cultures 1, BCID and urine culture confirms Escherichia coli-pansensitive. Prior to sensitivities, there was concern for ESBL and patient had been treated with meropenem. Now transitioned to IV Rocephin. Discussed with infectious disease M.D. on call: When appropriate, okay to transition to oral amoxicillin or Bactrim to complete total 7-10 days course of antibiotics. 2. Acute encephalopathy: Secondary to sepsis. Continues to steadily improve. 3. Type II DM, uncontrolled with neuropathy: Discussed with diabetes coordinator. Continue sliding scale but resumed reduced dose of Lantus. Adjust insulin's as needed. A1c 9.8 suggesting poor control. Oral  medications held. 4. Essential hypertension: Controlled. Continue metoprolol & lisinopril. 5. MDD/bipolar disorder: To be assessed when mental status improves. Is on Abilify and duloxetine. If mental status declined so does not continue to improve then consider holding these medications temporarily. 6. Parkinson's disease: Continue Sinemet. 7. Hyperlipidemia: Statins. 8. Hypokalemia: Replace and follow. magnesium normal.  9. Anemia:? Dilutional. Follow CBCs.    DVT prophylaxis: Lovenox Code Status: Full Family Communication: None at bedside Disposition Plan: DC to SNF probably in the next 48 hours.    Consultants:   None  Procedures:   None  Antimicrobials:   IV ceftriaxone  Subjective: Denies chest pain or dyspnea. Complained of some intermittent dysuria.  Objective:  Vitals:   10/06/16 2345 10/07/16 0246 10/07/16 0517 10/07/16 1311  BP: (!) 132/52  (!) 112/51 (!) 132/53  Pulse: 80  73 77  Resp: _0 Temp: 98 F (36.7 C)  98 F (36.7 C) 98.4 F (36.9 C)  TempSrc: Oral  Oral Oral  SpO2: 99%  98% 94%  Weight:  109.6 kg (241 lb 11.2 oz)    Height:        Intake/Output Summary (Last 24 hours) at 10/07/16 1615 Last data filed at 10/07/16 0400  Gross per 24 hour  Intake           891.25 ml  Output                0 ml  Net           891.25 ml   Filed Weights   10/05/16 2121 10/06/16 0259 10/07/16 0246  Weight: 109.9 kg (242 lb 3.2 oz) 109.8 kg (242 lb 1 oz) 109.6 kg (241 lb 11.2 oz)  Examination:  General exam: Pleasant elderly female lying comfortably propped up in bed. Respiratory system: Clear to auscultation. No increased work of breathing. Cardiovascular system: S1 & S2 heard, RRR. No JVD, murmurs, rubs, gallops or clicks. No pedal edema.  Gastrointestinal system: Abdomen is nondistended, soft and nontender. No organomegaly or masses felt. Normal bowel sounds heard. Central nervous system: Alert and oriented to person and place. No focal  neurological deficits. Extremities: Moving all extremities symmetrically. Skin: No rashes, lesions or ulcers Psychiatry: Judgement and insight impaired. Mood & affect appropriate.     Data Reviewed: I have personally reviewed following labs and imaging studies  CBC:  Recent Labs Lab 10/05/16 0931 10/06/16 0644 10/07/16 0700  WBC 18.4* 14.4* 15.0*  NEUTROABS 15.3*  --   --   HGB 16.3* 12.3 11.7*  HCT 48.2* 36.8 35.4*  MCV 91.1 92.2 93.4  PLT 265 223 161   Basic Metabolic Panel:  Recent Labs Lab 10/05/16 0931 10/06/16 0644 10/07/16 0700  NA 137 142 141  K 4.1 3.4* 3.4*  CL 101 112* 110  CO2 _0 GLUCOSE 323* 185* 132*  BUN _1 CREATININE 1.05* 0.90 0.92  CALCIUM 9.4 8.2* 8.3*  MG  --   --  2.0   GFR: Estimated Creatinine Clearance: 64.3 mL/min (by C-G formula based on SCr of 0.92 mg/dL). Liver Function Tests:  Recent Labs Lab 10/05/16 0931  AST 21  ALT 9*  ALKPHOS 115  BILITOT 1.6*  PROT 7.6  ALBUMIN 3.1*   No results for input(s): LIPASE, AMYLASE in the last 168 hours. No results for input(s): AMMONIA in the last 168 hours. Coagulation Profile:  Recent Labs Lab 10/05/16 0931  INR 1.21   Cardiac Enzymes: No results for input(s): CKTOTAL, CKMB, CKMBINDEX, TROPONINI in the last 168 hours. BNP (last 3 results) No results for input(s): PROBNP in the last 8760 hours. HbA1C:  Recent Labs  10/05/16 0931  HGBA1C 9.8*   CBG:  Recent Labs Lab 10/06/16 1156 10/06/16 1707 10/06/16 2344 10/07/16 0740 10/07/16 1152  GLUCAP 163* 222* 163* 128* 215*   Lipid Profile: No results for input(s): CHOL, HDL, LDLCALC, TRIG, CHOLHDL, LDLDIRECT in the last 72 hours. Thyroid Function Tests: No results for input(s): TSH, T4TOTAL, FREET4, T3FREE, THYROIDAB in the last 72 hours. Anemia Panel: No results for input(s): VITAMINB12, FOLATE, FERRITIN, TIBC, IRON, RETICCTPCT in the last 72 hours.  Sepsis Labs:  Recent Labs Lab 10/05/16 0931  10/05/16 0948 10/05/16 1316 10/05/16 1338 10/05/16 1707 10/06/16 0644 10/07/16 0700  PROCALCITON  --   --  2.86  --   --   --   --   WBC 18.4*  --   --   --   --  14.4* 15.0*  LATICACIDVEN  --  3.86* 2.6* 3.05* 2.9*  --   --     Recent Results (from the past 240 hour(s))  Culture, blood (Routine x 2)     Status: Abnormal   Collection Time: 10/05/16  9:31 AM  Result Value Ref Range Status   Specimen Description BLOOD RIGHT ANTECUBITAL  Final   Special Requests BOTTLES DRAWN AEROBIC AND ANAEROBIC  5CC  Final   Culture  Setup Time   Final    GRAM NEGATIVE RODS IN BOTH AEROBIC AND ANAEROBIC BOTTLES CRITICAL RESULT CALLED TO, READ BACK BY AND VERIFIED WITH: A MEYER,PHARMD _2  10/06/16 MKELLY,MLT    Culture ESCHERICHIA COLI (A)  Final   Report Status 10/07/2016 FINAL  Final  Organism ID, Bacteria ESCHERICHIA COLI  Final      Susceptibility   Escherichia coli - MIC*    AMPICILLIN 8 SENSITIVE Sensitive     CEFAZOLIN <=4 SENSITIVE Sensitive     CEFEPIME <=1 SENSITIVE Sensitive     CEFTAZIDIME <=1 SENSITIVE Sensitive     CEFTRIAXONE <=1 SENSITIVE Sensitive     CIPROFLOXACIN <=0.25 SENSITIVE Sensitive     GENTAMICIN <=1 SENSITIVE Sensitive     IMIPENEM <=0.25 SENSITIVE Sensitive     TRIMETH/SULFA <=20 SENSITIVE Sensitive     AMPICILLIN/SULBACTAM <=2 SENSITIVE Sensitive     PIP/TAZO <=4 SENSITIVE Sensitive     Extended ESBL NEGATIVE Sensitive     * ESCHERICHIA COLI  Blood Culture ID Panel (Reflexed)     Status: Abnormal   Collection Time: 10/05/16  9:31 AM  Result Value Ref Range Status   Enterococcus species NOT DETECTED NOT DETECTED Final   Vancomycin resistance NOT DETECTED NOT DETECTED Final   Listeria monocytogenes NOT DETECTED NOT DETECTED Final   Staphylococcus species NOT DETECTED NOT DETECTED Final   Staphylococcus aureus NOT DETECTED NOT DETECTED Final   Methicillin resistance NOT DETECTED NOT DETECTED Final   Streptococcus species NOT DETECTED NOT DETECTED Final    Streptococcus agalactiae NOT DETECTED NOT DETECTED Final   Streptococcus pneumoniae NOT DETECTED NOT DETECTED Final   Streptococcus pyogenes NOT DETECTED NOT DETECTED Final   Acinetobacter baumannii NOT DETECTED NOT DETECTED Final   Enterobacteriaceae species (A) NOT DETECTED Final    CRITICAL RESULT CALLED TO, READ BACK BY AND VERIFIED WITH:    Comment: A MEYER,PHARMD _0  10/06/16 MKELLY,MLT DETECTED DETECTED    Enterobacter cloacae complex NOT DETECTED NOT DETECTED Final   Escherichia coli DETECTED (A) NOT DETECTED Final    Comment: CRITICAL RESULT CALLED TO, READ BACK BY AND VERIFIED WITH: A MEYER,PHARMD _1  10/06/16 MKELLY,MLT    Klebsiella oxytoca NOT DETECTED NOT DETECTED Final   Klebsiella pneumoniae NOT DETECTED NOT DETECTED Final   Proteus species NOT DETECTED NOT DETECTED Final   Serratia marcescens NOT DETECTED NOT DETECTED Final   Carbapenem resistance NOT DETECTED NOT DETECTED Final   Haemophilus influenzae NOT DETECTED NOT DETECTED Final   Neisseria meningitidis NOT DETECTED NOT DETECTED Final   Pseudomonas aeruginosa NOT DETECTED NOT DETECTED Final   Candida albicans NOT DETECTED NOT DETECTED Final   Candida glabrata NOT DETECTED NOT DETECTED Final   Candida krusei NOT DETECTED NOT DETECTED Final   Candida parapsilosis NOT DETECTED NOT DETECTED Final   Candida tropicalis NOT DETECTED NOT DETECTED Final  Culture, blood (Routine x 2)     Status: None (Preliminary result)   Collection Time: 10/05/16 10:10 AM  Result Value Ref Range Status   Specimen Description BLOOD RIGHT HAND  Final   Special Requests BOTTLES DRAWN AEROBIC AND ANAEROBIC 5CC  Final   Culture  Setup Time   Final    GRAM NEGATIVE RODS IN BOTH AEROBIC AND ANAEROBIC BOTTLES CRITICAL VALUE NOTED.  VALUE IS CONSISTENT WITH PREVIOUSLY REPORTED AND CALLED VALUE.    Culture GRAM NEGATIVE RODS  Final   Report Status PENDING  Incomplete  Urine culture     Status: Abnormal   Collection Time: 10/05/16  10:28 AM  Result Value Ref Range Status   Specimen Description URINE, CATHETERIZED  Final   Special Requests NONE  Final   Culture >=100,000 COLONIES/mL ESCHERICHIA COLI (A)  Final   Report Status 10/07/2016 FINAL  Final   Organism ID, Bacteria ESCHERICHIA COLI (A)  Final  Susceptibility   Escherichia coli - MIC*    AMPICILLIN 8 SENSITIVE Sensitive     CEFAZOLIN <=4 SENSITIVE Sensitive     CEFTRIAXONE <=1 SENSITIVE Sensitive     CIPROFLOXACIN <=0.25 SENSITIVE Sensitive     GENTAMICIN <=1 SENSITIVE Sensitive     IMIPENEM <=0.25 SENSITIVE Sensitive     NITROFURANTOIN <=16 SENSITIVE Sensitive     TRIMETH/SULFA <=20 SENSITIVE Sensitive     AMPICILLIN/SULBACTAM 4 SENSITIVE Sensitive     PIP/TAZO <=4 SENSITIVE Sensitive     Extended ESBL NEGATIVE Sensitive     * >=100,000 COLONIES/mL ESCHERICHIA COLI         Radiology Studies: No results found.      Scheduled Meds: . ARIPiprazole  2 mg Oral Daily  . atorvastatin  10 mg Oral Daily  . carbidopa-levodopa  1 tablet Oral BID  . cefTRIAXone (ROCEPHIN)  IV  2 g Intravenous Q24H  . conjugated estrogens  1 Applicatorful Vaginal Q M,W,F  . DULoxetine  60 mg Oral Daily  . enoxaparin (LOVENOX) injection  55 mg Subcutaneous Q24H  . insulin aspart  0-15 Units Subcutaneous TID WC  . insulin aspart  0-5 Units Subcutaneous QHS  . insulin glargine  35 Units Subcutaneous Daily  . lisinopril  20 mg Oral Daily  . magnesium oxide  400 mg Oral Daily  . metoprolol tartrate  25 mg Oral BID  . potassium chloride  40 mEq Oral Q4H  . senna-docusate  2 tablet Oral BID  . sodium chloride flush  3 mL Intravenous Q12H   Continuous Infusions:   LOS: 2 days     [facility], MD [facility] Pager [phone]  If 7PM-7AM, please contact night-coverage [email or web address] Password [facility] 10/07/2016, 4:15 PM

## 2016-10-08 ENCOUNTER — Inpatient Hospital Stay (HOSPITAL_COMMUNITY): Payer: Medicare Other

## 2016-10-08 LAB — GLUCOSE, CAPILLARY
GLUCOSE-CAPILLARY: 143 mg/dL — AB (ref 65–99)
GLUCOSE-CAPILLARY: 153 mg/dL — AB (ref 65–99)
Glucose-Capillary: 189 mg/dL — ABNORMAL HIGH (ref 65–99)
Glucose-Capillary: 171 mg/dL — ABNORMAL HIGH (ref 65–99)

## 2016-10-08 LAB — COMPREHENSIVE METABOLIC PANEL
ALBUMIN: 2.6 g/dL — AB (ref 3.5–5.0)
ALT: 6 U/L — AB (ref 14–54)
AST: 20 U/L (ref 15–41)
Alkaline Phosphatase: 100 U/L (ref 38–126)
Anion gap: 8 (ref 5–15)
BUN: 11 mg/dL (ref 6–20)
CHLORIDE: 108 mmol/L (ref 101–111)
CO2: 23 mmol/L (ref 22–32)
CREATININE: 0.77 mg/dL (ref 0.44–1.00)
Calcium: 9 mg/dL (ref 8.9–10.3)
GFR calc Af Amer: >60 mL/min (ref >60)
GFR calc non Af Amer: >60 mL/min (ref >60)
GLUCOSE: 154 mg/dL — AB (ref 65–99)
POTASSIUM: 4.2 mmol/L (ref 3.5–5.1)
SODIUM: 139 mmol/L (ref 135–145)
Total Bilirubin: 0.4 mg/dL (ref 0.3–1.2)
Total Protein: 6.9 g/dL (ref 6.5–8.1)

## 2016-10-08 LAB — CBC
HCT: 39.7 % (ref 36.0–46.0)
Hemoglobin: 13.1 g/dL (ref 12.0–15.0)
MCH: 30.3 pg (ref 26.0–34.0)
MCHC: 33 g/dL (ref 30.0–36.0)
MCV: 91.9 fL (ref 78.0–100.0)
PLATELETS: 285 10*3/uL (ref 150–400)
RBC: 4.32 MIL/uL (ref 3.87–5.11)
RDW: 14 % (ref 11.5–15.5)
WBC: 12.2 10*3/uL — AB (ref 4.0–10.5)

## 2016-10-08 LAB — CULTURE, BLOOD (ROUTINE X 2)

## 2016-10-08 MED ORDER — DEXTROSE 5 % IV SOLN
2.0000 g | INTRAVENOUS | Status: DC
  Administered 2016-10-08 – 2016-10-09 (×2): 2 g via INTRAVENOUS
  Filled 2016-10-08 (×2): qty 2

## 2016-10-08 MED ORDER — LAMOTRIGINE 100 MG PO TABS
100.0000 mg | ORAL_TABLET | Freq: Two times a day (BID) | ORAL | Status: DC
  Administered 2016-10-08 – 2016-10-09 (×2): 100 mg via ORAL
  Filled 2016-10-08 (×3): qty 1

## 2016-10-08 MED ORDER — PANTOPRAZOLE SODIUM 40 MG PO TBEC
40.0000 mg | DELAYED_RELEASE_TABLET | Freq: Every day | ORAL | Status: DC
  Administered 2016-10-09: 40 mg via ORAL
  Filled 2016-10-08 (×2): qty 1

## 2016-10-08 MED ORDER — OXYBUTYNIN CHLORIDE 5 MG PO TABS
5.0000 mg | ORAL_TABLET | Freq: Two times a day (BID) | ORAL | Status: DC
  Administered 2016-10-08 – 2016-10-09 (×2): 5 mg via ORAL
  Filled 2016-10-08 (×3): qty 1

## 2016-10-08 MED ORDER — AMOXICILLIN 500 MG PO TABS: 500.0000 mg | ORAL_TABLET | Freq: Three times a day (TID) | ORAL | Status: DC

## 2016-10-08 MED ORDER — HYDRALAZINE HCL 20 MG/ML IJ SOLN
5.0000 mg | Freq: Four times a day (QID) | INTRAMUSCULAR | Status: DC | PRN
Start: 2016-10-08 — End: 2016-10-09
  Administered 2016-10-08: 5 mg via INTRAVENOUS
  Filled 2016-10-08: qty 1

## 2016-10-08 MED ORDER — ALUM & MAG HYDROXIDE-SIMETH 200-200-20 MG/5ML PO SUSP
30.0000 mL | Freq: Four times a day (QID) | ORAL | Status: DC | PRN
  Administered 2016-10-08: 30 mL via ORAL
  Filled 2016-10-08: qty 30

## 2016-10-08 MED ORDER — ESTROGENS, CONJUGATED 0.625 MG/GM VA CREA: 1.0000 | TOPICAL_CREAM | VAGINAL | Status: AC

## 2016-10-08 MED ORDER — ASPIRIN EC 81 MG PO TBEC
81.0000 mg | DELAYED_RELEASE_TABLET | Freq: Every day | ORAL | Status: DC
  Administered 2016-10-09: 81 mg via ORAL
  Filled 2016-10-08 (×2): qty 1

## 2016-10-08 NOTE — Progress Notes (Signed)
Per MD, patient discharged cancelled.   Osborne Cascoadia Dollie Mayse LCSWA [phone]

## 2016-10-08 NOTE — Progress Notes (Signed)
Patient refused to let me collect a MRSA swab

## 2016-10-08 NOTE — Discharge Summary (Signed)
Physician Discharge Summary  Maureen Taylor PNT:[id number] DOB: Jan 11, 1935  PCP: Blanchie Serve, MD  Admit date: 10/05/2016 Discharge date: 10/08/2016   Recommendations for Outpatient Follow-up:  1. Dr.Mahima Pandey/MD at SNF in 4 days with repeat labs (CBC & BMP).  Home Health: None Equipment/Devices: None    Discharge Condition: Improved and stable  CODE STATUS: Full  Diet recommendation: Heart healthy & diabetic diet.  Discharge Diagnoses:  Principal Problem:   Sepsis ([place]) Active Problems:   GERD (gastroesophageal reflux disease)   Essential hypertension   Parkinson's disease ([place])   Diabetes mellitus with neuropathy ([place])   Hyperlipidemia   UTI (urinary tract infection)   Morbid obesity ([place])   Acute encephalopathy   Brief/Interim Summary: 82 year old female patient with history of GERD, HLD, HTN, MDD/bipolar disorder, Parkinson's disease, DM 2 with neuropathy, UTIs, presented to [facility] ED from [facility] on 10/05/16 due to altered mental status and fever. At the nursing home, she was flushed, lethargic and warm to touch. EMS was called and she would not respond to sternal rub however this improved while transporting her to ED. Hospitalized a month ago after a fall related to altered mental status from UTI. In the ED, temperature 102, tachycardic, tachypnea, and admitted for sepsis secondary to UTI and acute encephalopathy related to sepsis.    Assessment & Plan:   1. Sepsis secondary to Escherichia coli UTI (recurrent UTIs) and bacteremia: Met sepsis criteria on admission. Treated per sepsis protocol with IV fluids. Sepsis physiology resolved. Blood cultures 1, BCID and urine culture confirms Escherichia coli-pansensitive. Prior to sensitivities, there was concern for ESBL and patient had been treated with meropenem. Now transitioned to IV Rocephin. She will complete 4 days of IV antibiotics prior to discharge. Discussed with infectious disease M.D. on call on 10/07/16: okay  to transition to oral amoxicillin or Bactrim to complete total 7-10 days course of antibiotics. DC on amoxicillin to complete total 10 days treatment. It is very important to maintain local hygiene to avoid frequent UTIs. As per discussion with daughter, several times in the night patient is incontinent and is not changed until the next morning and she and her sister were planning to discuss with staff at SNF. 2. Acute encephalopathy: Secondary to sepsis. As per discussion with daughter, mental status almost back to baseline. 3. Type II DM, uncontrolled with neuropathy: A1c 9.8 suggesting poor control. Patient was placed on reduced dose of Lantus, oral hypoglycemics were held and she was placed on SSI. Mildly uncontrolled and fluctuating. At discharge, resume home regimen of insulin's and oral hypoglycemics with close CBG monitoring and adjust doses as needed. 4. Essential hypertension: Mildly uncontrolled. Continue metoprolol & lisinopril. 5. MDD/bipolar disorder: Continue Abilify and duloxetine. Stable. 6. Parkinson's disease: Continue Sinemet. 7. Hyperlipidemia: Statins. 8. Hypokalemia: Replaced. Magnesium normal.    Consultants:   None  Procedures:   None   Discharge Instructions  Discharge Instructions    Call MD for:    Complete by:  As directed    Worsening or recurrent confusion or altered mental status.   Call MD for:  difficulty breathing, headache or visual disturbances    Complete by:  As directed    Call MD for:  extreme fatigue    Complete by:  As directed    Call MD for:  persistant dizziness or light-headedness    Complete by:  As directed    Call MD for:  persistant nausea and vomiting    Complete by:  As directed  Call MD for:  severe uncontrolled pain    Complete by:  As directed    Call MD for:  temperature >100.4    Complete by:  As directed    Diet - low sodium heart healthy    Complete by:  As directed    Diet Carb Modified    Complete by:  As  directed    Increase activity slowly    Complete by:  As directed        Medication List    TAKE these medications   acetaminophen 325 MG tablet Commonly known as:  TYLENOL Take 650 mg by mouth every 6 (six) hours as needed for mild pain.   amoxicillin 500 MG tablet Commonly known as:  AMOXIL Take 1 tablet (500 mg total) by mouth 3 (three) times daily. Discontinue after 10/14/2016 doses. Start taking on:  10/09/2016   ARIPiprazole 2 MG tablet Commonly known as:  ABILIFY Take 2 mg by mouth daily.   aspirin 81 MG EC tablet Take 81 mg by mouth daily. Swallow whole.   atorvastatin 10 MG tablet Commonly known as:  LIPITOR Take 10 mg by mouth daily.   bisacodyl 10 MG suppository Commonly known as:  DULCOLAX Place 10 mg rectally daily as needed for moderate constipation.   carbidopa-levodopa 25-100 MG disintegrating tablet Commonly known as:  PARCOPA Take 1 tablet by mouth 2 (two) times daily.   conjugated estrogens vaginal cream Commonly known as:  PREMARIN Place 1 Applicatorful vaginally every Monday, Wednesday, and Friday. Apply 0.'5mg'$  (pea-sized amount)  just inside the vaginal introitus with a finger-tip every Monday, Wednesday and Friday nights. Start taking on:  10/10/2016 What changed:  when to take this  additional instructions   Cranberry 200 MG Caps Take 400 mg by mouth daily. Take two 200-mg capsules = 400 mg   DULoxetine 60 MG capsule Commonly known as:  CYMBALTA Take 60 mg by mouth daily.   gabapentin 100 MG capsule Commonly known as:  NEURONTIN Take 200 mg by mouth at bedtime.   glimepiride 4 MG tablet Commonly known as:  AMARYL Take 4 mg by mouth daily with breakfast.   insulin glargine 100 UNIT/ML injection Commonly known as:  LANTUS Inject 0.35 mLs (35 Units total) into the skin 2 (two) times daily. QAM and QHS   insulin lispro 100 UNIT/ML injection Commonly known as:  HUMALOG Inject 8-12 Units into the skin See admin instructions. Check CBG  BID for CBG of 200-399 give 8 unit; for CBG>400 give 12 units hold for CBG <200-   lamoTRIgine 100 MG tablet Commonly known as:  LAMICTAL Take 100 mg by mouth 2 (two) times daily.   lisinopril 20 MG tablet Commonly known as:  PRINIVIL,ZESTRIL Take 20 mg by mouth daily.   loratadine 10 MG tablet Commonly known as:  CLARITIN Take 10 mg by mouth daily.   magnesium oxide 400 MG tablet Commonly known as:  MAG-OX Take 400 mg by mouth daily.   metoprolol tartrate 25 MG tablet Commonly known as:  LOPRESSOR Take 25 mg by mouth 2 (two) times daily. Take 25 mg by mouth twice a day for HTN   METROLOTION 0.75 % Lotn Generic drug:  METRONIDAZOLE (TOPICAL) Apply 1 application topically 2 (two) times daily. Starting 08/12/16. To face, for rosacea.   multivitamin-lutein Caps capsule Take 1 capsule by mouth daily.   oxybutynin 5 MG tablet Commonly known as:  DITROPAN Take 5 mg by mouth 2 (two) times daily.   pantoprazole 40  MG tablet Commonly known as:  PROTONIX Take 40 mg by mouth daily.   polyethylene glycol packet Commonly known as:  MIRALAX / GLYCOLAX Take 17 g by mouth 2 (two) times daily.   PROCEL PO Take 1 scoop by mouth 2 (two) times daily.   sennosides-docusate sodium 8.6-50 MG tablet Commonly known as:  SENOKOT-S Take 2 tablets by mouth 2 (two) times daily.   SYSTANE BALANCE OP Apply 1 drop to eye 2 (two) times daily. Both eyes      Follow-up Information    MD at SNF.Marland Kitchen Schedule an appointment as soon as possible for a visit in 4 day(s).   Why:  To be seen with repeat labs (CBC & BMP).       Blanchie Serve, MD. Schedule an appointment as soon as possible for a visit.   Specialty:  Internal Medicine Why:  She may be the PCP at SNF. Contact information: [place] [address] [phone]          Allergies  Allergen Reactions  . Codeine Itching  . Morphine And Related Itching  . Indocin [Indomethacin] Rash       Procedures/Studies: Dg Chest Portable 1 View  Result Date: 10/05/2016 CLINICAL DATA:  Fever.  Unresponsive.  Lethargy. EXAM: PORTABLE CHEST 1 VIEW COMPARISON:  08/24/2016. FINDINGS: Normal heart size. Aortic atherosclerosis is identified. No pleural effusion or edema. No airspace opacities. IMPRESSION: 1. No acute cardiopulmonary abnormalities. 2. Aortic atherosclerosis. Electronically Signed   By: Kerby Moors M.D.   On: 10/05/2016 09:57      Subjective: Denies complaints. No pain or dyspnea reported. States that her dysuria has improved. As per RN, no acute issues reported. As per discussion with daughter today, they visited her last night and her mental status was almost close to baseline.  Discharge Exam:  Vitals:   10/07/16 1311 10/07/16 2129 10/08/16 0407 10/08/16 0456  BP: (!) 132/53 (!) 145/56  (!) 153/80  Pulse: 77 74  70  Resp: '20 18  17  '$ Temp: 98.4 F (36.9 C) 98.2 F (36.8 C)  99.1 F (37.3 C)  TempSrc: Oral Oral  Oral  SpO2: 94% 99%  98%  Weight:   111.3 kg (245 lb 6.4 oz)   Height:        General exam: Pleasant elderly female sitting up comfortably in bed. Respiratory system: Clear to auscultation. No increased work of breathing. Cardiovascular system: S1 & S2 heard, RRR. No JVD, murmurs, rubs, gallops or clicks. No pedal edema.  Gastrointestinal system: Abdomen is nondistended, soft and nontender. No organomegaly or masses felt. Normal bowel sounds heard. Central nervous system: Alert and oriented to person, place and partly to time. No focal neurological deficits. Flat affect Extremities: Moving all extremities symmetrically. Skin: No rashes, lesions or ulcers Psychiatry: Judgement and insight impaired. Mood & affect: Flat affect/? Mask face     The results of significant diagnostics from this hospitalization (including imaging, microbiology, ancillary and laboratory) are listed below for reference.     Microbiology: Recent Results (from the  past 240 hour(s))  Culture, blood (Routine x 2)     Status: Abnormal   Collection Time: 10/05/16  9:31 AM  Result Value Ref Range Status   Specimen Description BLOOD RIGHT ANTECUBITAL  Final   Special Requests BOTTLES DRAWN AEROBIC AND ANAEROBIC  5CC  Final   Culture  Setup Time   Final    GRAM NEGATIVE RODS IN BOTH AEROBIC AND ANAEROBIC BOTTLES CRITICAL RESULT CALLED  TO, READ BACK BY AND VERIFIED WITH: A MEYER,PHARMD '@0143'$  10/06/16 MKELLY,MLT    Culture ESCHERICHIA COLI (A)  Final   Report Status 10/07/2016 FINAL  Final   Organism ID, Bacteria ESCHERICHIA COLI  Final      Susceptibility   Escherichia coli - MIC*    AMPICILLIN 8 SENSITIVE Sensitive     CEFAZOLIN <=4 SENSITIVE Sensitive     CEFEPIME <=1 SENSITIVE Sensitive     CEFTAZIDIME <=1 SENSITIVE Sensitive     CEFTRIAXONE <=1 SENSITIVE Sensitive     CIPROFLOXACIN <=0.25 SENSITIVE Sensitive     GENTAMICIN <=1 SENSITIVE Sensitive     IMIPENEM <=0.25 SENSITIVE Sensitive     TRIMETH/SULFA <=20 SENSITIVE Sensitive     AMPICILLIN/SULBACTAM <=2 SENSITIVE Sensitive     PIP/TAZO <=4 SENSITIVE Sensitive     Extended ESBL NEGATIVE Sensitive     * ESCHERICHIA COLI  Blood Culture ID Panel (Reflexed)     Status: Abnormal   Collection Time: 10/05/16  9:31 AM  Result Value Ref Range Status   Enterococcus species NOT DETECTED NOT DETECTED Final   Vancomycin resistance NOT DETECTED NOT DETECTED Final   Listeria monocytogenes NOT DETECTED NOT DETECTED Final   Staphylococcus species NOT DETECTED NOT DETECTED Final   Staphylococcus aureus NOT DETECTED NOT DETECTED Final   Methicillin resistance NOT DETECTED NOT DETECTED Final   Streptococcus species NOT DETECTED NOT DETECTED Final   Streptococcus agalactiae NOT DETECTED NOT DETECTED Final   Streptococcus pneumoniae NOT DETECTED NOT DETECTED Final   Streptococcus pyogenes NOT DETECTED NOT DETECTED Final   Acinetobacter baumannii NOT DETECTED NOT DETECTED Final   Enterobacteriaceae species  (A) NOT DETECTED Final    CRITICAL RESULT CALLED TO, READ BACK BY AND VERIFIED WITH:    Comment: A MEYER,PHARMD '@0143'$  10/06/16 MKELLY,MLT DETECTED DETECTED    Enterobacter cloacae complex NOT DETECTED NOT DETECTED Final   Escherichia coli DETECTED (A) NOT DETECTED Final    Comment: CRITICAL RESULT CALLED TO, READ BACK BY AND VERIFIED WITH: A MEYER,PHARMD '@0143'$  10/06/16 MKELLY,MLT    Klebsiella oxytoca NOT DETECTED NOT DETECTED Final   Klebsiella pneumoniae NOT DETECTED NOT DETECTED Final   Proteus species NOT DETECTED NOT DETECTED Final   Serratia marcescens NOT DETECTED NOT DETECTED Final   Carbapenem resistance NOT DETECTED NOT DETECTED Final   Haemophilus influenzae NOT DETECTED NOT DETECTED Final   Neisseria meningitidis NOT DETECTED NOT DETECTED Final   Pseudomonas aeruginosa NOT DETECTED NOT DETECTED Final   Candida albicans NOT DETECTED NOT DETECTED Final   Candida glabrata NOT DETECTED NOT DETECTED Final   Candida krusei NOT DETECTED NOT DETECTED Final   Candida parapsilosis NOT DETECTED NOT DETECTED Final   Candida tropicalis NOT DETECTED NOT DETECTED Final  Culture, blood (Routine x 2)     Status: None (Preliminary result)   Collection Time: 10/05/16 10:10 AM  Result Value Ref Range Status   Specimen Description BLOOD RIGHT HAND  Final   Special Requests BOTTLES DRAWN AEROBIC AND ANAEROBIC 5CC  Final   Culture  Setup Time   Final    GRAM NEGATIVE RODS IN BOTH AEROBIC AND ANAEROBIC BOTTLES CRITICAL VALUE NOTED.  VALUE IS CONSISTENT WITH PREVIOUSLY REPORTED AND CALLED VALUE.    Culture GRAM NEGATIVE RODS  Final   Report Status PENDING  Incomplete  Urine culture     Status: Abnormal   Collection Time: 10/05/16 10:28 AM  Result Value Ref Range Status   Specimen Description URINE, CATHETERIZED  Final   Special Requests NONE  Final   Culture >=100,000 COLONIES/mL ESCHERICHIA COLI (A)  Final   Report Status 10/07/2016 FINAL  Final   Organism ID, Bacteria ESCHERICHIA COLI  (A)  Final      Susceptibility   Escherichia coli - MIC*    AMPICILLIN 8 SENSITIVE Sensitive     CEFAZOLIN <=4 SENSITIVE Sensitive     CEFTRIAXONE <=1 SENSITIVE Sensitive     CIPROFLOXACIN <=0.25 SENSITIVE Sensitive     GENTAMICIN <=1 SENSITIVE Sensitive     IMIPENEM <=0.25 SENSITIVE Sensitive     NITROFURANTOIN <=16 SENSITIVE Sensitive     TRIMETH/SULFA <=20 SENSITIVE Sensitive     AMPICILLIN/SULBACTAM 4 SENSITIVE Sensitive     PIP/TAZO <=4 SENSITIVE Sensitive     Extended ESBL NEGATIVE Sensitive     * >=100,000 COLONIES/mL ESCHERICHIA COLI     Labs: BNP (last 3 results) No results for input(s): BNP in the last 8760 hours. Basic Metabolic Panel:  Recent Labs Lab 10/05/16 0931 10/06/16 0644 10/07/16 0700 10/08/16 0643  NA 137 142 141 139  K 4.1 3.4* 3.4* 4.2  CL 101 112* 110 108  CO2 '24 23 23 23  '$ GLUCOSE 323* 185* 132* 154*  BUN '14 16 14 11  '$ CREATININE 1.05* 0.90 0.92 0.77  CALCIUM 9.4 8.2* 8.3* 9.0  MG  --   --  2.0  --    Liver Function Tests:  Recent Labs Lab 10/05/16 0931 10/08/16 0643  AST 21 20  ALT 9* 6*  ALKPHOS 115 100  BILITOT 1.6* 0.4  PROT 7.6 6.9  ALBUMIN 3.1* 2.6*   CBC:  Recent Labs Lab 10/05/16 0931 10/06/16 0644 10/07/16 0700 10/08/16 0643  WBC 18.4* 14.4* 15.0* 12.2*  NEUTROABS 15.3*  --   --   --   HGB 16.3* 12.3 11.7* 13.1  HCT 48.2* 36.8 35.4* 39.7  MCV 91.1 92.2 93.4 91.9  PLT 265 223 232 285   CBG:  Recent Labs Lab 10/07/16 1152 10/07/16 1627 10/07/16 2131 10/08/16 0726 10/08/16 1142  GLUCAP 215* 202* 143* 143* 189*   Urinalysis    Component Value Date/Time   COLORURINE YELLOW 10/05/2016 1028   APPEARANCEUR TURBID (A) 10/05/2016 1028   APPEARANCEUR Cloudy (A) 02/25/2016 1548   LABSPEC 1.006 10/05/2016 1028   LABSPEC 1.017 10/30/2014 0830   PHURINE 5.0 10/05/2016 1028   GLUCOSEU 150 (A) 10/05/2016 1028   GLUCOSEU Negative 10/30/2014 0830   HGBUR MODERATE (A) 10/05/2016 1028   BILIRUBINUR NEGATIVE 10/05/2016  1028   BILIRUBINUR Negative 02/25/2016 1548   BILIRUBINUR Negative 10/30/2014 0830   KETONESUR NEGATIVE 10/05/2016 1028   PROTEINUR 100 (A) 10/05/2016 1028   NITRITE NEGATIVE 10/05/2016 1028   LEUKOCYTESUR LARGE (A) 10/05/2016 1028   LEUKOCYTESUR 3+ (A) 02/25/2016 1548   LEUKOCYTESUR 2+ 10/30/2014 0830   Discussed in detail with patient's daughter. Updated care and answered questions.  Time coordinating discharge: Over 30 minutes  SIGNED:  Vernell Leep, MD, FACP, [place]. [facility] Pager [phone]  If 7PM-7AM, please contact night-coverage [email or web address] Password [facility] 10/08/2016, 11:43 AM

## 2016-10-08 NOTE — Progress Notes (Signed)
Pt's BP 189/89. MD made aware. PRN and scheduled BP meds given. Will recheck BP in 30 min.

## 2016-10-08 NOTE — Progress Notes (Signed)
Addendum  Patient had been discharged to SNF but then started complaining of abdominal pain and nausea which she had not until then. BM 2 days ago. No vomiting. Examined at bedside: Abdomen is non distended, soft and non tender. Normal bowel sounds. No acute findings. KUB without acute findings.   Abdominal pain, unclear etiology. ? Dyspepsia. Trial of Maalox. Continue PPI. Cancel DC. Monitor overnight and reassess. Discussed again with daughter. Discussed with RN.  Marcellus ScottHONGALGI,ANAND, MD, FACP, FHM. [facility] Pager [phone]  If 7PM-7AM, please contact night-coverage [email or web address] Password [email or web address] 10/08/2016, 6:01 PM'

## 2016-10-08 NOTE — Progress Notes (Signed)
Patient will DC to: [facility] Anticipated DC date: 10/08/16 Family notified: Daughter Transport by: Sharin MonsPTAR 2:30pm   Per MD [facility]. RN, patient, patient's family, and facility notified of DC. Discharge Summary sent to facility. RN given number for report. DC packet on chart. Ambulance transport requested for patient.   CSW signing off.  Cristobal GoldmannNadia Jacquline Terrill, [place]WA Clinical Social Worker [phone]

## 2016-10-09 LAB — GLUCOSE, CAPILLARY
GLUCOSE-CAPILLARY: 146 mg/dL — AB (ref 65–99)
Glucose-Capillary: 173 mg/dL — ABNORMAL HIGH (ref 65–99)

## 2016-10-09 MED ORDER — AMLODIPINE BESYLATE 5 MG PO TABS: 5.0000 mg | ORAL_TABLET | Freq: Every day | ORAL | Status: DC

## 2016-10-09 MED ORDER — AMOXICILLIN 500 MG PO TABS: 500.0000 mg | ORAL_TABLET | Freq: Three times a day (TID) | ORAL | Status: DC

## 2016-10-09 MED ORDER — INSULIN GLARGINE 100 UNIT/ML ~~LOC~~ SOLN: 35.0000 [IU] | Freq: Every day | SUBCUTANEOUS | Status: DC

## 2016-10-09 MED ORDER — ALUM & MAG HYDROXIDE-SIMETH 200-200-20 MG/5ML PO SUSP: 30.0000 mL | Freq: Four times a day (QID) | ORAL | Status: AC | PRN

## 2016-10-09 MED ORDER — AMLODIPINE BESYLATE 5 MG PO TABS
5.0000 mg | ORAL_TABLET | Freq: Every day | ORAL | Status: DC
  Administered 2016-10-09: 5 mg via ORAL
  Filled 2016-10-09: qty 1

## 2016-10-09 NOTE — Clinical Social Work Placement (Signed)
   CLINICAL SOCIAL WORK PLACEMENT  NOTE  Date:  10/09/2016  Patient Details  Name: Maureen Taylor MRN: [id number] Date of Birth: Jan 21, 1935  Clinical Social Work is seeking post-discharge placement for this patient at the Skilled  Nursing Facility level of care (*CSW will initial, date and re-position this form in  chart as items are completed):  Yes   Patient/family provided with [place] Clinical Social Work Department's list of facilities offering this level of care within the geographic area requested by the patient (or if unable, by the patient's family).  Yes   Patient/family informed of their freedom to choose among providers that offer the needed level of care, that participate in Medicare, Medicaid or managed care program needed by the patient, have an available bed and are willing to accept the patient.  Yes   Patient/family informed of [facility] ownership interest in [facility] and [facility], as well as of the fact that they are under no obligation to receive care at these facilities.  PASRR submitted to EDS on 10/07/16     PASRR number received on 10/07/16     Existing PASRR number confirmed on 10/07/16     FL2 transmitted to all facilities in geographic area requested by pt/family on 10/07/16     FL2 transmitted to all facilities within larger geographic area on 10/07/16     Patient informed that his/her managed care company has contracts with or will negotiate with certain facilities, including the following:        Yes   Patient/family informed of bed offers received.  [facility]     Physician recommends and patient chooses bed at  [facility])    Patient to be transferred to  [facility]) on 10/09/16.  Patient to be transferred to facility by PTAR     Patient family notified on 10/09/16 of transfer.  Name of family member notified:  Wallene HuhJoni Alexander     PHYSICIAN       Additional Comment:     _______________________________________________ Mercy RidingJonathan F Lotus Santillo, LCSWA 10/09/2016, 4:11 PM

## 2016-10-09 NOTE — Discharge Summary (Addendum)
Physician Discharge Summary  Maureen Taylor ZSW:[id number] DOB: Mar 30, 1935  PCP: Blanchie Serve, MD  Admit date: 10/05/2016 Discharge date: 10/09/2016   Recommendations for Outpatient Follow-up:  1. Dr.Mahima Pandey/MD at SNF in 4 days with repeat labs (CBC & BMP). 2. Diabetes medications have been adjusted (Amaryl discontinued and Lantus dosed reduced). Please follow up closely at SNF and adjust medications as needed.  Home Health: None Equipment/Devices: None    Discharge Condition: Improved and stable  CODE STATUS: Full  Diet recommendation: Heart healthy & diabetic diet.  Discharge Diagnoses:  Principal Problem:   Sepsis ([place]) Active Problems:   GERD (gastroesophageal reflux disease)   Essential hypertension   Parkinson's disease ([place])   Diabetes mellitus with neuropathy ([place])   Hyperlipidemia   UTI (urinary tract infection)   Morbid obesity ([place])   Acute encephalopathy   Brief/Interim Summary: 82 year old female patient with history of GERD, HLD, HTN, MDD/bipolar disorder, Parkinson's disease, DM 2 with neuropathy, UTIs, presented to [facility] ED from [facility] on 10/05/16 due to altered mental status and fever. At the nursing home, she was flushed, lethargic and warm to touch. EMS was called and she would not respond to sternal rub however this improved while transporting her to ED. Hospitalized a month ago after a fall related to altered mental status from UTI. In the ED, temperature 102, tachycardic, tachypnea, and admitted for sepsis secondary to UTI and acute encephalopathy related to sepsis.    Assessment & Plan:   1. Sepsis secondary to Escherichia coli UTI (recurrent UTIs) and bacteremia: Met sepsis criteria on admission. Treated per sepsis protocol with IV fluids. Sepsis physiology resolved. Blood cultures 1, BCID and urine culture confirms Escherichia coli-pansensitive. Prior to sensitivities, there was concern for ESBL and patient had been treated with meropenem.  Now transitioned to IV Rocephin. She will complete 5 days of IV antibiotics prior to discharge. Discussed with infectious disease M.D. on call on 10/07/16: okay to transition to oral amoxicillin or Bactrim to complete total 7-10 days course of antibiotics. DC on amoxicillin to complete total 10 days treatment. It is very important to maintain local hygiene to avoid frequent UTIs. As per discussion with daughter, several times in the night patient is incontinent and is not changed until the next morning and she and her sister were planning to discuss with staff at SNF. 2. Acute encephalopathy: Secondary to sepsis. As per discussion with daughter, mental status almost back to baseline. 3. Type II DM, uncontrolled with neuropathy: A1c 9.8 suggesting poor control. Patient was placed on reduced dose of Lantus, oral hypoglycemics were held and she was placed on SSI. Mildly uncontrolled and fluctuating. At discharge, given her fluctuating oral intake, discontinue Amaryl 4 MG daily and reduce Lantus from 35 units 2 times daily to 35 units once daily. Monitor CBGs closely and adjust medications as needed. Continue SNF SSI. 4. Essential hypertension: Continue metoprolol & lisinopril. Patient had uncontrolled blood pressures yesterday and overnight 189/89. Added amlodipine 5 MG daily. Monitor at SNF. 5. MDD/bipolar disorder: Continue Abilify and duloxetine. Stable. 6. Parkinson's disease: Continue Sinemet. 7. Hyperlipidemia: Statins. 8. Hypokalemia: Replaced. Magnesium normal.  9. Abdominal pain: Patient was supposed to be discharged to SNF on 1/13. However she started complaining of abdominal pain, no specific location. Abdominal exam showed no acute findings. KUB showed no acute findings. Unclear etiology.? Dyspepsia. Tried a dose of Maalox. Continue PPI. Patient states that her abdominal pain has resolved since last night.   Consultants:   None  Procedures:  None   Discharge Instructions  Discharge  Instructions    Call MD for:    Complete by:  As directed    Worsening or recurrent confusion or altered mental status.   Call MD for:  difficulty breathing, headache or visual disturbances    Complete by:  As directed    Call MD for:  extreme fatigue    Complete by:  As directed    Call MD for:  persistant dizziness or light-headedness    Complete by:  As directed    Call MD for:  persistant nausea and vomiting    Complete by:  As directed    Call MD for:  severe uncontrolled pain    Complete by:  As directed    Call MD for:  severe uncontrolled pain    Complete by:  As directed    Call MD for:  temperature >100.4    Complete by:  As directed    Diet - low sodium heart healthy    Complete by:  As directed    Diet Carb Modified    Complete by:  As directed    Increase activity slowly    Complete by:  As directed        Medication List    STOP taking these medications   glimepiride 4 MG tablet Commonly known as:  AMARYL     TAKE these medications   acetaminophen 325 MG tablet Commonly known as:  TYLENOL Take 650 mg by mouth every 6 (six) hours as needed for mild pain.   alum & mag hydroxide-simeth 200-200-20 MG/5ML suspension Commonly known as:  MAALOX/MYLANTA Take 30 mLs by mouth every 6 (six) hours as needed for indigestion or heartburn.   amLODipine 5 MG tablet Commonly known as:  NORVASC Take 1 tablet (5 mg total) by mouth daily. Start taking on:  10/10/2016   amoxicillin 500 MG tablet Commonly known as:  AMOXIL Take 1 tablet (500 mg total) by mouth 3 (three) times daily. Discontinue after 10/14/2016 doses.   ARIPiprazole 2 MG tablet Commonly known as:  ABILIFY Take 2 mg by mouth daily.   aspirin 81 MG EC tablet Take 81 mg by mouth daily. Swallow whole.   atorvastatin 10 MG tablet Commonly known as:  LIPITOR Take 10 mg by mouth daily.   bisacodyl 10 MG suppository Commonly known as:  DULCOLAX Place 10 mg rectally daily as needed for moderate  constipation.   carbidopa-levodopa 25-100 MG disintegrating tablet Commonly known as:  PARCOPA Take 1 tablet by mouth 2 (two) times daily.   conjugated estrogens vaginal cream Commonly known as:  PREMARIN Place 1 Applicatorful vaginally every Monday, Wednesday, and Friday. Apply 0.95m (pea-sized amount)  just inside the vaginal introitus with a finger-tip every Monday, Wednesday and Friday nights. Start taking on:  10/10/2016 What changed:  when to take this  additional instructions   Cranberry 200 MG Caps Take 400 mg by mouth daily. Take two 200-mg capsules = 400 mg   DULoxetine 60 MG capsule Commonly known as:  CYMBALTA Take 60 mg by mouth daily.   gabapentin 100 MG capsule Commonly known as:  NEURONTIN Take 200 mg by mouth at bedtime.   insulin glargine 100 UNIT/ML injection Commonly known as:  LANTUS Inject 0.35 mLs (35 Units total) into the skin daily. What changed:  when to take this  additional instructions   insulin lispro 100 UNIT/ML injection Commonly known as:  HUMALOG Inject 8-12 Units into the skin See admin instructions.  Check CBG BID for CBG of 200-399 give 8 unit; for CBG>400 give 12 units hold for CBG <200-   lamoTRIgine 100 MG tablet Commonly known as:  LAMICTAL Take 100 mg by mouth 2 (two) times daily.   lisinopril 20 MG tablet Commonly known as:  PRINIVIL,ZESTRIL Take 20 mg by mouth daily.   loratadine 10 MG tablet Commonly known as:  CLARITIN Take 10 mg by mouth daily.   magnesium oxide 400 MG tablet Commonly known as:  MAG-OX Take 400 mg by mouth daily.   metoprolol tartrate 25 MG tablet Commonly known as:  LOPRESSOR Take 25 mg by mouth 2 (two) times daily. Take 25 mg by mouth twice a day for HTN   METROLOTION 0.75 % Lotn Generic drug:  METRONIDAZOLE (TOPICAL) Apply 1 application topically 2 (two) times daily. Starting 08/12/16. To face, for rosacea.   multivitamin-lutein Caps capsule Take 1 capsule by mouth daily.   oxybutynin 5  MG tablet Commonly known as:  DITROPAN Take 5 mg by mouth 2 (two) times daily.   pantoprazole 40 MG tablet Commonly known as:  PROTONIX Take 40 mg by mouth daily.   polyethylene glycol packet Commonly known as:  MIRALAX / GLYCOLAX Take 17 g by mouth 2 (two) times daily.   PROCEL PO Take 1 scoop by mouth 2 (two) times daily.   sennosides-docusate sodium 8.6-50 MG tablet Commonly known as:  SENOKOT-S Take 2 tablets by mouth 2 (two) times daily.   SYSTANE BALANCE OP Apply 1 drop to eye 2 (two) times daily. Both eyes      Follow-up Information    MD at SNF.Marland Kitchen Schedule an appointment as soon as possible for a visit in 4 day(s).   Why:  To be seen with repeat labs (CBC & BMP).       Blanchie Serve, MD. Schedule an appointment as soon as possible for a visit.   Specialty:  Internal Medicine Why:  She may be the PCP at SNF. Contact information: [place] [address] [phone]          Allergies  Allergen Reactions  . Codeine Itching  . Morphine And Related Itching  . Indocin [Indomethacin] Rash      Procedures/Studies: Dg Chest Portable 1 View  Result Date: 10/05/2016 CLINICAL DATA:  Fever.  Unresponsive.  Lethargy. EXAM: PORTABLE CHEST 1 VIEW COMPARISON:  08/24/2016. FINDINGS: Normal heart size. Aortic atherosclerosis is identified. No pleural effusion or edema. No airspace opacities. IMPRESSION: 1. No acute cardiopulmonary abnormalities. 2. Aortic atherosclerosis. Electronically Signed   By: Kerby Moors M.D.   On: 10/05/2016 09:57   Dg Abd Portable 1v  Result Date: 10/08/2016 CLINICAL DATA:  Abdominal pain.  Fever. EXAM: PORTABLE ABDOMEN - 1 VIEW COMPARISON:  One-view abdomen 03/25/2015 FINDINGS: The heart is enlarged. Lung bases are clear. The bowel gas pattern is normal. Degenerative changes are present in the lumbar spine and left hip. Right total hip arthroplasty is noted. IMPRESSION: 1. Normal bowel gas pattern. 2. Cardiomegaly without  failure. 3. Degenerative changes in the lumbar spine are stable. Electronically Signed   By: San Morelle M.D.   On: 10/08/2016 15:25      Subjective: Denies complaints. Denies abdominal pain since sometime last night. No nausea or vomiting. As per RN, no acute issues reported. Received a dose of IV when necessary hydralazine overnight for elevated blood pressure.  Discharge Exam:  Vitals:   10/08/16 2159 10/08/16 2303 10/09/16 0657 10/09/16 1043  BP:  Marland Kitchen)  157/85 (!) 131/59 131/68  Pulse: 66 71 73 82  Resp:   18   Temp:   97.7 F (36.5 C)   TempSrc:   Oral   SpO2: 93%  94%   Weight:   112 kg (246 lb 14.6 oz)   Height:        General exam: Pleasant elderly female lying comfortably supine in bed. Respiratory system: Clear to auscultation. No increased work of breathing. Cardiovascular system: S1 & S2 heard, RRR. No JVD, murmurs, rubs, gallops or clicks. No pedal edema.  Gastrointestinal system: Abdomen is nondistended, soft and nontender. No rigidity, guarding or rebound. No organomegaly or masses felt. Normal bowel sounds heard. Central nervous system: Alert and oriented to person, place and partly to time. No focal neurological deficits. Flat affect Extremities: Moving all extremities symmetrically. Skin: No rashes, lesions or ulcers Psychiatry: Judgement and insight impaired. Mood & affect: Flat affect/? Mask face     The results of significant diagnostics from this hospitalization (including imaging, microbiology, ancillary and laboratory) are listed below for reference.     Microbiology: Recent Results (from the past 240 hour(s))  Culture, blood (Routine x 2)     Status: Abnormal   Collection Time: 10/05/16  9:31 AM  Result Value Ref Range Status   Specimen Description BLOOD RIGHT ANTECUBITAL  Final   Special Requests BOTTLES DRAWN AEROBIC AND ANAEROBIC  5CC  Final   Culture  Setup Time   Final    GRAM NEGATIVE RODS IN BOTH AEROBIC AND ANAEROBIC  BOTTLES CRITICAL RESULT CALLED TO, READ BACK BY AND VERIFIED WITH: A MEYER,PHARMD _0  10/06/16 MKELLY,MLT    Culture ESCHERICHIA COLI (A)  Final   Report Status 10/07/2016 FINAL  Final   Organism ID, Bacteria ESCHERICHIA COLI  Final      Susceptibility   Escherichia coli - MIC*    AMPICILLIN 8 SENSITIVE Sensitive     CEFAZOLIN <=4 SENSITIVE Sensitive     CEFEPIME <=1 SENSITIVE Sensitive     CEFTAZIDIME <=1 SENSITIVE Sensitive     CEFTRIAXONE <=1 SENSITIVE Sensitive     CIPROFLOXACIN <=0.25 SENSITIVE Sensitive     GENTAMICIN <=1 SENSITIVE Sensitive     IMIPENEM <=0.25 SENSITIVE Sensitive     TRIMETH/SULFA <=20 SENSITIVE Sensitive     AMPICILLIN/SULBACTAM <=2 SENSITIVE Sensitive     PIP/TAZO <=4 SENSITIVE Sensitive     Extended ESBL NEGATIVE Sensitive     * ESCHERICHIA COLI  Blood Culture ID Panel (Reflexed)     Status: Abnormal   Collection Time: 10/05/16  9:31 AM  Result Value Ref Range Status   Enterococcus species NOT DETECTED NOT DETECTED Final   Vancomycin resistance NOT DETECTED NOT DETECTED Final   Listeria monocytogenes NOT DETECTED NOT DETECTED Final   Staphylococcus species NOT DETECTED NOT DETECTED Final   Staphylococcus aureus NOT DETECTED NOT DETECTED Final   Methicillin resistance NOT DETECTED NOT DETECTED Final   Streptococcus species NOT DETECTED NOT DETECTED Final   Streptococcus agalactiae NOT DETECTED NOT DETECTED Final   Streptococcus pneumoniae NOT DETECTED NOT DETECTED Final   Streptococcus pyogenes NOT DETECTED NOT DETECTED Final   Acinetobacter baumannii NOT DETECTED NOT DETECTED Final   Enterobacteriaceae species (A) NOT DETECTED Final    CRITICAL RESULT CALLED TO, READ BACK BY AND VERIFIED WITH:    Comment: A MEYER,PHARMD _1  10/06/16 MKELLY,MLT DETECTED DETECTED    Enterobacter cloacae complex NOT DETECTED NOT DETECTED Final   Escherichia coli DETECTED (A) NOT DETECTED Final    Comment:  CRITICAL RESULT CALLED TO, READ BACK BY AND VERIFIED  WITH: A MEYER,PHARMD _0  10/06/16 MKELLY,MLT    Klebsiella oxytoca NOT DETECTED NOT DETECTED Final   Klebsiella pneumoniae NOT DETECTED NOT DETECTED Final   Proteus species NOT DETECTED NOT DETECTED Final   Serratia marcescens NOT DETECTED NOT DETECTED Final   Carbapenem resistance NOT DETECTED NOT DETECTED Final   Haemophilus influenzae NOT DETECTED NOT DETECTED Final   Neisseria meningitidis NOT DETECTED NOT DETECTED Final   Pseudomonas aeruginosa NOT DETECTED NOT DETECTED Final   Candida albicans NOT DETECTED NOT DETECTED Final   Candida glabrata NOT DETECTED NOT DETECTED Final   Candida krusei NOT DETECTED NOT DETECTED Final   Candida parapsilosis NOT DETECTED NOT DETECTED Final   Candida tropicalis NOT DETECTED NOT DETECTED Final  Culture, blood (Routine x 2)     Status: Abnormal   Collection Time: 10/05/16 10:10 AM  Result Value Ref Range Status   Specimen Description BLOOD RIGHT HAND  Final   Special Requests BOTTLES DRAWN AEROBIC AND ANAEROBIC 5CC  Final   Culture  Setup Time   Final    GRAM NEGATIVE RODS IN BOTH AEROBIC AND ANAEROBIC BOTTLES CRITICAL VALUE NOTED.  VALUE IS CONSISTENT WITH PREVIOUSLY REPORTED AND CALLED VALUE.    Culture (A)  Final    ESCHERICHIA COLI SUSCEPTIBILITIES PERFORMED ON PREVIOUS CULTURE WITHIN THE LAST 5 DAYS.    Report Status 10/08/2016 FINAL  Final  Urine culture     Status: Abnormal   Collection Time: 10/05/16 10:28 AM  Result Value Ref Range Status   Specimen Description URINE, CATHETERIZED  Final   Special Requests NONE  Final   Culture >=100,000 COLONIES/mL ESCHERICHIA COLI (A)  Final   Report Status 10/07/2016 FINAL  Final   Organism ID, Bacteria ESCHERICHIA COLI (A)  Final      Susceptibility   Escherichia coli - MIC*    AMPICILLIN 8 SENSITIVE Sensitive     CEFAZOLIN <=4 SENSITIVE Sensitive     CEFTRIAXONE <=1 SENSITIVE Sensitive     CIPROFLOXACIN <=0.25 SENSITIVE Sensitive     GENTAMICIN <=1 SENSITIVE Sensitive     IMIPENEM  <=0.25 SENSITIVE Sensitive     NITROFURANTOIN <=16 SENSITIVE Sensitive     TRIMETH/SULFA <=20 SENSITIVE Sensitive     AMPICILLIN/SULBACTAM 4 SENSITIVE Sensitive     PIP/TAZO <=4 SENSITIVE Sensitive     Extended ESBL NEGATIVE Sensitive     * >=100,000 COLONIES/mL ESCHERICHIA COLI     Labs: BNP (last 3 results) No results for input(s): BNP in the last 8760 hours. Basic Metabolic Panel:  Recent Labs Lab 10/05/16 0931 10/06/16 0644 10/07/16 0700 10/08/16 0643  NA 137 142 141 139  K 4.1 3.4* 3.4* 4.2  CL 101 112* 110 108  CO2 _1 GLUCOSE 323* 185* 132* 154*  BUN _2 CREATININE 1.05* 0.90 0.92 0.77  CALCIUM 9.4 8.2* 8.3* 9.0  MG  --   --  2.0  --    Liver Function Tests:  Recent Labs Lab 10/05/16 0931 10/08/16 0643  AST 21 20  ALT 9* 6*  ALKPHOS 115 100  BILITOT 1.6* 0.4  PROT 7.6 6.9  ALBUMIN 3.1* 2.6*   CBC:  Recent Labs Lab 10/05/16 0931 10/06/16 0644 10/07/16 0700 10/08/16 0643  WBC 18.4* 14.4* 15.0* 12.2*  NEUTROABS 15.3*  --   --   --   HGB 16.3* 12.3 11.7* 13.1  HCT 48.2* 36.8 35.4* 39.7  MCV 91.1 92.2 93.4  91.9  PLT 265 223 232 285   CBG:  Recent Labs Lab 10/08/16 0726 10/08/16 1142 10/08/16 1710 10/08/16 2156 10/09/16 0817  GLUCAP 143* 189* 171* 153* 146*   Urinalysis    Component Value Date/Time   COLORURINE YELLOW 10/05/2016 1028   APPEARANCEUR TURBID (A) 10/05/2016 1028   APPEARANCEUR Cloudy (A) 02/25/2016 1548   LABSPEC 1.006 10/05/2016 1028   LABSPEC 1.017 10/30/2014 0830   PHURINE 5.0 10/05/2016 1028   GLUCOSEU 150 (A) 10/05/2016 1028   GLUCOSEU Negative 10/30/2014 0830   HGBUR MODERATE (A) 10/05/2016 1028   BILIRUBINUR NEGATIVE 10/05/2016 1028   BILIRUBINUR Negative 02/25/2016 1548   BILIRUBINUR Negative 10/30/2014 0830   KETONESUR NEGATIVE 10/05/2016 1028   PROTEINUR 100 (A) 10/05/2016 1028   NITRITE NEGATIVE 10/05/2016 1028   LEUKOCYTESUR LARGE (A) 10/05/2016 1028   LEUKOCYTESUR 3+ (A) 02/25/2016  1548   LEUKOCYTESUR 2+ 10/30/2014 0830   Discussed in detail with patient's daughter again today. Updated care and answered questions.  Time coordinating discharge: Over 30 minutes  SIGNED:  Vernell Leep, MD, FACP, [place]. [facility] Pager [phone]  If 7PM-7AM, please contact night-coverage [email or web address] Password [facility] 10/09/2016, 11:35 AM

## 2016-10-09 NOTE — Progress Notes (Signed)
Nsg Discharge Note  Admit Date:  10/05/2016 Discharge date: 10/09/2016   Nadara MustardJessie B Rubiano to be D/C'd Skilled nursing facility per MD order.  AVS completed.  Copy for chart, and copy for patient signed, and dated. Patient/caregiver able to verbalize understanding.  Discharge Medication: Allergies as of 10/09/2016      Reactions   Codeine Itching   Morphine And Related Itching   Indocin [indomethacin] Rash      Medication List    STOP taking these medications   glimepiride 4 MG tablet Commonly known as:  AMARYL     TAKE these medications   acetaminophen 325 MG tablet Commonly known as:  TYLENOL Take 650 mg by mouth every 6 (six) hours as needed for mild pain.   alum & mag hydroxide-simeth 200-200-20 MG/5ML suspension Commonly known as:  MAALOX/MYLANTA Take 30 mLs by mouth every 6 (six) hours as needed for indigestion or heartburn.   amLODipine 5 MG tablet Commonly known as:  NORVASC Take 1 tablet (5 mg total) by mouth daily. Start taking on:  10/10/2016   amoxicillin 500 MG tablet Commonly known as:  AMOXIL Take 1 tablet (500 mg total) by mouth 3 (three) times daily. Discontinue after 10/14/2016 doses. Start taking on:  10/10/2016   ARIPiprazole 2 MG tablet Commonly known as:  ABILIFY Take 2 mg by mouth daily.   aspirin 81 MG EC tablet Take 81 mg by mouth daily. Swallow whole.   atorvastatin 10 MG tablet Commonly known as:  LIPITOR Take 10 mg by mouth daily.   bisacodyl 10 MG suppository Commonly known as:  DULCOLAX Place 10 mg rectally daily as needed for moderate constipation.   carbidopa-levodopa 25-100 MG disintegrating tablet Commonly known as:  PARCOPA Take 1 tablet by mouth 2 (two) times daily.   conjugated estrogens vaginal cream Commonly known as:  PREMARIN Place 1 Applicatorful vaginally every Monday, Wednesday, and Friday. Apply 0.5mg  (pea-sized amount)  just inside the vaginal introitus with a finger-tip every Monday, Wednesday and Friday  nights. Start taking on:  10/10/2016 What changed:  when to take this  additional instructions   Cranberry 200 MG Caps Take 400 mg by mouth daily. Take two 200-mg capsules = 400 mg   DULoxetine 60 MG capsule Commonly known as:  CYMBALTA Take 60 mg by mouth daily.   gabapentin 100 MG capsule Commonly known as:  NEURONTIN Take 200 mg by mouth at bedtime.   insulin glargine 100 UNIT/ML injection Commonly known as:  LANTUS Inject 0.35 mLs (35 Units total) into the skin daily. What changed:  when to take this  additional instructions   insulin lispro 100 UNIT/ML injection Commonly known as:  HUMALOG Inject 8-12 Units into the skin See admin instructions. Check CBG BID for CBG of 200-399 give 8 unit; for CBG>400 give 12 units hold for CBG <200-   lamoTRIgine 100 MG tablet Commonly known as:  LAMICTAL Take 100 mg by mouth 2 (two) times daily.   lisinopril 20 MG tablet Commonly known as:  PRINIVIL,ZESTRIL Take 20 mg by mouth daily.   loratadine 10 MG tablet Commonly known as:  CLARITIN Take 10 mg by mouth daily.   magnesium oxide 400 MG tablet Commonly known as:  MAG-OX Take 400 mg by mouth daily.   metoprolol tartrate 25 MG tablet Commonly known as:  LOPRESSOR Take 25 mg by mouth 2 (two) times daily. Take 25 mg by mouth twice a day for HTN   METROLOTION 0.75 % Lotn Generic drug:  METRONIDAZOLE (  TOPICAL) Apply 1 application topically 2 (two) times daily. Starting 08/12/16. To face, for rosacea.   multivitamin-lutein Caps capsule Take 1 capsule by mouth daily.   oxybutynin 5 MG tablet Commonly known as:  DITROPAN Take 5 mg by mouth 2 (two) times daily.   pantoprazole 40 MG tablet Commonly known as:  PROTONIX Take 40 mg by mouth daily.   polyethylene glycol packet Commonly known as:  MIRALAX / GLYCOLAX Take 17 g by mouth 2 (two) times daily.   PROCEL PO Take 1 scoop by mouth 2 (two) times daily.   sennosides-docusate sodium 8.6-50 MG tablet Commonly  known as:  SENOKOT-S Take 2 tablets by mouth 2 (two) times daily.   SYSTANE BALANCE OP Apply 1 drop to eye 2 (two) times daily. Both eyes       Discharge Assessment: Vitals:   10/09/16 0657 10/09/16 1043  BP: (!) 131/59 131/68  Pulse: 73 82  Resp: 18   Temp: 97.7 F (36.5 C)    Skin clean, dry and intact without evidence of skin break down, no evidence of skin tears noted. IV catheter discontinued intact. Site without signs and symptoms of complications - no redness or edema noted at insertion site, patient denies c/o pain - only slight tenderness at site.  Dressing with slight pressure applied.  D/c Instructions-Education: Discharge instructions given to patient/family with verbalized understanding. D/c education completed with patient/family including follow up instructions, medication list, d/c activities limitations if indicated, with other d/c instructions as indicated by MD - patient able to verbalize understanding, all questions fully answered. Patient instructed to return to ED, call 911, or call MD for any changes in condition.  Patient transported via [facility] to [address]  [facility] called at 13:43 but may be delayed up to 2 hours.  Camillo Flaming, RN 10/09/2016 1:44 PM

## 2016-10-09 NOTE — Progress Notes (Signed)
Progress note  Patient had been discharged to SNF on 1/13 but developed abdominal pain and discharge had to be canceled. Interviewed and examined patient this morning. Discussed with RN and patient's family. Abdominal pain seems to have resolved. Updated discharge summary with history and physical exam and medication changes. Updated DC summary will need to be printed. RN to provide her with today's dose of IV ceftriaxone prior to discharge. DC to SNF.  Maureen ScottHONGALGI,Maureen Woessner, MD, FACP, FHM. [facility] Pager [phone]  If 7PM-7AM, please contact night-coverage [email or web address] Password [email or web address] 10/09/2016, 11:45 AM

## 2016-10-09 NOTE — Progress Notes (Signed)
[facility]  RN will call and arrange EMS for transport.  [facility]  [facility].  CSW contacted/spoke to the pt's daughter Wallene HuhJoni alexander and they are aware of the D/C plan.  Pt's daughter is in agreement with the plan.  Please reconsult with CSW in future if needed. CSW signing off.   Dorothe PeaJonathan F. Carine Nordgren, LCSWA, LCAS   10/09/16

## 2016-10-09 NOTE — Progress Notes (Addendum)
Attempt[facility]. Was transferred to nurses station twice with no answer both times. Attempted report again. Was able to reach nurse receiving patient and report was given.

## 2016-10-10 ENCOUNTER — Telehealth: Payer: Self-pay

## 2016-10-10 ENCOUNTER — Encounter: Payer: Self-pay | Admitting: Adult Health

## 2016-10-10 ENCOUNTER — Non-Acute Institutional Stay (SKILLED_NURSING_FACILITY): Payer: Medicare Other | Admitting: Adult Health

## 2016-10-10 DIAGNOSIS — Z794 Long term (current) use of insulin: Secondary | ICD-10-CM

## 2016-10-10 DIAGNOSIS — G629 Polyneuropathy, unspecified: Secondary | ICD-10-CM

## 2016-10-10 DIAGNOSIS — F3130 Bipolar disorder, current episode depressed, mild or moderate severity, unspecified: Secondary | ICD-10-CM | POA: Diagnosis not present

## 2016-10-10 DIAGNOSIS — G20 Parkinson's disease: Secondary | ICD-10-CM

## 2016-10-10 DIAGNOSIS — E785 Hyperlipidemia, unspecified: Secondary | ICD-10-CM

## 2016-10-10 DIAGNOSIS — K219 Gastro-esophageal reflux disease without esophagitis: Secondary | ICD-10-CM

## 2016-10-10 DIAGNOSIS — I10 Essential (primary) hypertension: Secondary | ICD-10-CM | POA: Diagnosis not present

## 2016-10-10 DIAGNOSIS — B9620 Unspecified Escherichia coli [E. coli] as the cause of diseases classified elsewhere: Secondary | ICD-10-CM

## 2016-10-10 DIAGNOSIS — E1140 Type 2 diabetes mellitus with diabetic neuropathy, unspecified: Secondary | ICD-10-CM

## 2016-10-10 DIAGNOSIS — K5900 Constipation, unspecified: Secondary | ICD-10-CM

## 2016-10-10 DIAGNOSIS — A4151 Sepsis due to Escherichia coli [E. coli]: Secondary | ICD-10-CM

## 2016-10-10 DIAGNOSIS — E782 Mixed hyperlipidemia: Secondary | ICD-10-CM | POA: Diagnosis not present

## 2016-10-10 DIAGNOSIS — N390 Urinary tract infection, site not specified: Secondary | ICD-10-CM | POA: Diagnosis not present

## 2016-10-10 DIAGNOSIS — R531 Weakness: Secondary | ICD-10-CM

## 2016-10-10 NOTE — Progress Notes (Signed)
DATE:  10/10/2016   MRN:  [id number]  BIRTHDAY: May 25, 1935  Facility:  Nursing Home Location:  [facility]  Nursing Home Room Number: 905-A  LEVEL OF CARE:  SNF [phone])  Contact Information    Name Relation Home Work [place] Daughter [phone]     Katrina Stack Daughter [phone]  [phone]       Code Status History    Date Active Date Inactive Code Status Order ID Comments User Context   10/05/2016 12:17 PM 10/09/2016  6:37 PM Full Code [id number]  Gwenyth Bender, NP ED   08/24/2016  8:19 PM 08/27/2016  7:12 PM Full Code [id number]  Narda Bonds, MD Inpatient       Chief Complaint  Patient presents with  . Hospitalization Follow-up    HISTORY OF PRESENT ILLNESS:  This is a 81-YO female readmitted to [facility] for long-term care on 10/09/2016 following an admission to [facility] 10/05/2016-10/09/2016 for altered mental status and fever.  She was diagnosed with sepsis secondary to UTI and acute encephalopathy related to sepsis. She was given IV fluids. There was concern for ESBL so she was started on meropenem then transition to IV Rocephin. Treatment was discussed with infectious diseas  on 10/07/16 then transitioned to oral amoxicillin to complete a total of 10 days course of antibiotics. Latest A1c 9.8, due to fluctuating oral intake, Amaryl was discontinued and Lantus was decreased to 35 units once a day instead of twice a day. She had uncontrolled blood pressure in the hospital so amlodipine 5 mg was added.  She was seen in the room today with a female friend. She complained about the right side break of her wheelchair being broken.  PAST MEDICAL HISTORY:  Past Medical History:  Diagnosis Date  . Allergic rhinitis   . Ascorbic acid deficiency   . Bipolar affective disorder (HCC)   . Candidiasis   . Charcot's joint   . Chronic constipation   . Dysuria   . GERD (gastroesophageal reflux disease)   . History of  falling   . HLD (hyperlipidemia)   . HTN (hypertension)   . Magnesium deficiency   . Major depressive disorder   . Mixed incontinence   . Muscle weakness   . Neuropathic pain   . Neuropathy (HCC)   . OAB (overactive bladder)   . Obesity   . Oral pain   . Parkinson disease (HCC)   . Pneumonia   . Protein calorie malnutrition (HCC)   . Rosacea   . Slow transit constipation   . Type 2 diabetes mellitus with diabetic polyneuropathy, with long-term current use of insulin (HCC)   . Urinary tract infection without hematuria   . Vaginal atrophy   . Vitamin deficiency      CURRENT MEDICATIONS: Reviewed  Patient's Medications  New Prescriptions   No medications on file  Previous Medications   ACETAMINOPHEN (TYLENOL) 325 MG TABLET    Take 650 mg by mouth every 6 (six) hours as needed for mild pain.    ALUM & MAG HYDROXIDE-SIMETH (MAALOX/MYLANTA) 200-200-20 MG/5ML SUSPENSION    Take 30 mLs by mouth every 6 (six) hours as needed for indigestion or heartburn.   AMLODIPINE (NORVASC) 5 MG TABLET    Take 1 tablet (5 mg total) by mouth daily.   AMOXICILLIN (AMOXIL) 500 MG TABLET    Take 1 tablet (500 mg total) by mouth 3 (three) times daily. Discontinue after 10/14/2016 doses.  ARIPIPRAZOLE (ABILIFY) 2 MG TABLET    Take 2 mg by mouth daily.   ASPIRIN 81 MG EC TABLET    Take 81 mg by mouth daily. Swallow whole.   ATORVASTATIN (LIPITOR) 10 MG TABLET    Take 10 mg by mouth daily.   BISACODYL (DULCOLAX) 10 MG SUPPOSITORY    Place 10 mg rectally daily as needed for moderate constipation.   CARBIDOPA-LEVODOPA (PARCOPA) 25-100 MG DISINTEGRATING TABLET    Take 1 tablet by mouth 2 (two) times daily.    CONJUGATED ESTROGENS (PREMARIN) VAGINAL CREAM    Place 1 Applicatorful vaginally every Monday, Wednesday, and Friday. Apply 0.5mg  (pea-sized amount)  just inside the vaginal introitus with a finger-tip every Monday, Wednesday and Friday nights.   CRANBERRY 200 MG CAPS    Take 400 mg by mouth daily. Take  two 200-mg capsules = 400 mg   DULOXETINE (CYMBALTA) 60 MG CAPSULE    Take 60 mg by mouth daily.   GABAPENTIN (NEURONTIN) 100 MG CAPSULE    Take 200 mg by mouth at bedtime.    INSULIN GLARGINE (LANTUS) 100 UNIT/ML INJECTION    Inject 0.35 mLs (35 Units total) into the skin daily.   INSULIN LISPRO (HUMALOG) 100 UNIT/ML INJECTION    Inject 8-12 Units into the skin See admin instructions. Check CBG BID for CBG of 200-399 give 8 unit; for CBG>400 give 12 units hold for CBG <200-   LAMOTRIGINE (LAMICTAL) 100 MG TABLET    Take 100 mg by mouth 2 (two) times daily.    LISINOPRIL (PRINIVIL,ZESTRIL) 20 MG TABLET    Take 20 mg by mouth daily.    LORATADINE (CLARITIN) 10 MG TABLET    Take 10 mg by mouth daily.    MAGNESIUM OXIDE (MAG-OX) 400 MG TABLET    Take 400 mg by mouth daily.   METOPROLOL TARTRATE (LOPRESSOR) 25 MG TABLET    Take 25 mg by mouth 2 (two) times daily. Take 25 mg by mouth twice a day for HTN   METRONIDAZOLE, TOPICAL, (METROLOTION) 0.75 % LOTN    Apply 1 application topically 2 (two) times daily. Starting 08/12/16. To face, for rosacea.   MULTIVITAMIN-LUTEIN (OCUVITE-LUTEIN) CAPS CAPSULE    Take 1 capsule by mouth daily.   OXYBUTYNIN (DITROPAN) 5 MG TABLET    Take 5 mg by mouth 2 (two) times daily.    PANTOPRAZOLE (PROTONIX) 40 MG TABLET    Take 40 mg by mouth daily.    POLYETHYLENE GLYCOL (MIRALAX / GLYCOLAX) PACKET    Take 17 g by mouth 2 (two) times daily.   PROPYLENE GLYCOL (SYSTANE BALANCE OP)    Apply 1 drop to eye 2 (two) times daily. Both eyes   PROTEIN (PROCEL PO)    Take 1 scoop by mouth 2 (two) times daily.    SENNOSIDES-DOCUSATE SODIUM (SENOKOT-S) 8.6-50 MG TABLET    Take 2 tablets by mouth 2 (two) times daily.  Modified Medications   No medications on file  Discontinued Medications   No medications on file     Allergies  Allergen Reactions  . Codeine Itching  . Morphine And Related Itching  . Indocin [Indomethacin] Rash     REVIEW OF SYSTEMS:  GENERAL: no change  in appetite, no fatigue, no weight changes, no fever, chills or weakness EYES: Denies change in vision, dry eyes, eye pain, itching or discharge EARS: Denies change in hearing, ringing in ears, or earache NOSE: Denies nasal congestion or epistaxis MOUTH and THROAT: Denies oral discomfort,  gingival pain or bleeding, pain from teeth or hoarseness   RESPIRATORY: no cough, SOB, DOE, wheezing, hemoptysis CARDIAC: no chest pain, edema or palpitations GI: no abdominal pain, diarrhea, constipation, heart burn, nausea or vomiting GU: Denies dysuria, frequency, hematuria, incontinence, or discharge PSYCHIATRIC: Denies feeling of depression or anxiety. No report of hallucinations, insomnia, paranoia, or agitation    PHYSICAL EXAMINATION  GENERAL APPEARANCE: Well nourished. In no acute distress. Obese SKIN:  Skin is warm and dry.  HEAD: Normal in size and contour. No evidence of trauma EYES: Lids open and close normally. No blepharitis, entropion or ectropion. PERRL. Conjunctivae are clear and sclerae are white. Lenses are without opacity EARS: Pinnae are normal. Patient hears normal voice tunes of the examiner MOUTH and THROAT: Lips are without lesions. Oral mucosa is moist and without lesions. Tongue is normal in shape, size, and color and without lesions NECK: supple, trachea midline, no neck masses, no thyroid tenderness, no thyromegaly LYMPHATICS: no LAN in the neck, no supraclavicular LAN RESPIRATORY: breathing is even & unlabored, BS CTAB CARDIAC: RRR, no murmur,no extra heart sounds, no edema GI: abdomen soft, normal BS, no masses, no tenderness, no hepatomegaly, no splenomegaly EXTREMITIES:  Able to move 4 extremities  PSYCHIATRIC: Alert and oriented X 3. Affect and behavior are appropriate  LABS/RADIOLOGY: Labs reviewed: Basic Metabolic Panel:  Recent Labs  16/10/96 0644 10/07/16 0700 10/08/16 0643  NA 142 141 139  K 3.4* 3.4* 4.2  CL 112* 110 108  CO2 23 23 23   GLUCOSE 185*  132* 154*  BUN 16 14 11   CREATININE 0.90 0.92 0.77  CALCIUM 8.2* 8.3* 9.0  MG  --  2.0  --    Liver Function Tests:  Recent Labs  08/24/16 1305 10/05/16 0931 10/08/16 0643  AST 22 21 20   ALT 9* 9* 6*  ALKPHOS 91 115 100  BILITOT 0.7 1.6* 0.4  PROT 6.7 7.6 6.9  ALBUMIN 3.1* 3.1* 2.6*   CBC:  Recent Labs  08/24/16 0102 08/24/16 1305  10/05/16 0931 10/06/16 0644 10/07/16 0700 10/08/16 0643  WBC 11.3* 11.0*  < > 18.4* 14.4* 15.0* 12.2*  NEUTROABS 8.0* 8.0*  --  15.3*  --   --   --   HGB 13.4 13.1  < > 16.3* 12.3 11.7* 13.1  HCT 40.2 39.6  < > 48.2* 36.8 35.4* 39.7  MCV 91.4 90.8  < > 91.1 92.2 93.4 91.9  PLT 235 227  < > 265 223 232 285  < > = values in this interval not displayed. Lipid Panel:  Recent Labs  12/03/15 07/04/16  HDL 32* 35   Cardiac Enzymes:  Recent Labs  08/24/16 1305  TROPONINI <0.03   CBG:  Recent Labs  10/08/16 2156 10/09/16 0817 10/09/16 1232  GLUCAP 153* 146* 173*      Dg Chest Portable 1 View  Result Date: 10/05/2016 CLINICAL DATA:  Fever.  Unresponsive.  Lethargy. EXAM: PORTABLE CHEST 1 VIEW COMPARISON:  08/24/2016. FINDINGS: Normal heart size. Aortic atherosclerosis is identified. No pleural effusion or edema. No airspace opacities. IMPRESSION: 1. No acute cardiopulmonary abnormalities. 2. Aortic atherosclerosis. Electronically Signed   By: Signa Kell M.D.   On: 10/05/2016 09:57   Dg Abd Portable 1v  Result Date: 10/08/2016 CLINICAL DATA:  Abdominal pain.  Fever. EXAM: PORTABLE ABDOMEN - 1 VIEW COMPARISON:  One-view abdomen 03/25/2015 FINDINGS: The heart is enlarged. Lung bases are clear. The bowel gas pattern is normal. Degenerative changes are present in the lumbar spine and  left hip. Right total hip arthroplasty is noted. IMPRESSION: 1. Normal bowel gas pattern. 2. Cardiomegaly without failure. 3. Degenerative changes in the lumbar spine are stable. Electronically Signed   By: Marin Robertshristopher  Mattern M.D.   On: 10/08/2016  15:25    ASSESSMENT/PLAN:  Generalized weakness - for rehabilitation, PT and OT, for therapeutic strengthening exercises; fall precautions  Sepsis secondary to Escherichia coli UTI and bacteremia - there was concern for ESBL so she was started on meropenem then transition to IV Rocephin. It was discussed with infectious disease and she was discharged on amoxicillin to complete a total of 10 days treatment; check CBC   Escherichia coli UTI - she was discharged on amoxicillin to complete a total of 10 days treatment;   Diabetes mellitus, type II - due to fluctuating oral intake, Amaryl was discontinued and Lantus was decreased to 35 units once a day instead of twice a day, Humalog sliding scale twice a day Lab Results  Component Value Date   HGBA1C 9.8 (H) 10/05/2016   Essential hypertension - continue metoprolol and lisinopril, amlodipine 5 mg daily was recently added; BP/HR twice a day 1 week; check BMP  Bipolar disorder - continue Abilify and duloxetine  Parkinson's disease - continue Sinemet  Hyperlipidemia - continue atorvastatin 10 mg 1 tab by mouth daily  Constipation - continue MiraLAX 17 g by mouth twice a day, senna S2 tabs by mouth twice a day and Dulcolax 10 mg suppository 1 rectally daily when necessary  Neuropathy - continue Gabapentin 100 mg 2 capsules PO Q HS  Bipolar disorder - continue Abilify 2 mg 1 tab PO Q D   GERD - continue Protonix 40 mg 1 tab by mouth every morning    Goals of care:  Short-term rehabilitation/ long-term care    Monina C. Medina-Vargas - NP [facility] [phone]

## 2016-10-10 NOTE — Telephone Encounter (Signed)
Re-admission to facility. [facility] . [facility] is needed if patient was re-admitted to facility upon discharge. Hospital discharge from [facility] on 10/09/16.

## 2016-10-11 ENCOUNTER — Non-Acute Institutional Stay (SKILLED_NURSING_FACILITY): Payer: Medicare Other | Admitting: Internal Medicine

## 2016-10-11 ENCOUNTER — Encounter: Payer: Self-pay | Admitting: Internal Medicine

## 2016-10-11 DIAGNOSIS — K219 Gastro-esophageal reflux disease without esophagitis: Secondary | ICD-10-CM

## 2016-10-11 DIAGNOSIS — F319 Bipolar disorder, unspecified: Secondary | ICD-10-CM

## 2016-10-11 DIAGNOSIS — N390 Urinary tract infection, site not specified: Secondary | ICD-10-CM | POA: Diagnosis not present

## 2016-10-11 DIAGNOSIS — G20 Parkinson's disease: Secondary | ICD-10-CM | POA: Diagnosis not present

## 2016-10-11 DIAGNOSIS — R531 Weakness: Secondary | ICD-10-CM

## 2016-10-11 DIAGNOSIS — B9620 Unspecified Escherichia coli [E. coli] as the cause of diseases classified elsewhere: Secondary | ICD-10-CM | POA: Diagnosis not present

## 2016-10-11 DIAGNOSIS — E1149 Type 2 diabetes mellitus with other diabetic neurological complication: Secondary | ICD-10-CM | POA: Diagnosis not present

## 2016-10-11 DIAGNOSIS — E785 Hyperlipidemia, unspecified: Secondary | ICD-10-CM | POA: Diagnosis not present

## 2016-10-11 DIAGNOSIS — F3130 Bipolar disorder, current episode depressed, mild or moderate severity, unspecified: Secondary | ICD-10-CM | POA: Diagnosis not present

## 2016-10-11 DIAGNOSIS — I10 Essential (primary) hypertension: Secondary | ICD-10-CM | POA: Diagnosis not present

## 2016-10-11 DIAGNOSIS — G20A1 Parkinson's disease without dyskinesia, without mention of fluctuations: Secondary | ICD-10-CM

## 2016-10-11 NOTE — Progress Notes (Signed)
LOCATION: [facility]  PCP: Oneal Grout, MD   Code Status: Full Code  Goals of care: Advanced Directive information Advanced Directives 10/05/2016  Does Patient Have a Medical Advance Directive? No  Would patient like information on creating a medical advance directive? No - Patient declined       Extended Emergency Contact Information Primary Emergency Contact: [facility] Address: [address]          [place], [place] [address] Darden Amber of [place] Home Phone: [phone] Relation: Daughter Secondary Emergency Contact: Kerr,Jennifer L Address: [address]          [place], [place] [address] Darden Amber of [place] Home Phone: [phone] Mobile Phone: [phone] Relation: Daughter   Allergies  Allergen Reactions  . Codeine Itching  . Morphine And Related Itching  . Indocin [Indomethacin] Rash    Chief Complaint  Patient presents with  . Readmit To SNF    Readmission Visit      HPI:  Patient is a 82 y.o. female seen today for Long-term care post hospital readmission from 10/05/2016-10/09/2016 with sepsis from Escherichia coli UTI. She was treated with IV antibiotic and later switched to oral antibiotic. Her Lantus dose was adjusted and her oral hypoglycemic was discontinued in the hospital. She is seen in her room today. She has medical history of Parkinson's disease, bipolar disorder, hypertension, high fall risk, type 2 diabetes mellitus among others.   Review of Systems:  Constitutional: Negative for fever, chills, diaphoresis. Feels weak and tired.  HENT: Negative for headache, congestion, nasal discharge, sore throat, difficulty swallowing.   Eyes: Negative for double vision and discharge.  Respiratory: Negative for cough, shortness of breath and wheezing.   Cardiovascular: Negative for chest pain, palpitations.  Gastrointestinal: Negative for nausea, vomiting, abdominal pain. Positive for heartburn. Last bowel movement was yesterday.  Genitourinary:  Negative for dysuria and flank pain.  Musculoskeletal: Negative for back pain, fall in the facility.  Skin: Negative for itching, rash.  Neurological: Negative for dizziness. Psychiatric/Behavioral: Negative for depression   Past Medical History:  Diagnosis Date  . Allergic rhinitis   . Ascorbic acid deficiency   . Bipolar affective disorder (HCC)   . Candidiasis   . Charcot's joint   . Chronic constipation   . Dysuria   . GERD (gastroesophageal reflux disease)   . History of falling   . HLD (hyperlipidemia)   . HTN (hypertension)   . Magnesium deficiency   . Major depressive disorder   . Mixed incontinence   . Muscle weakness   . Neuropathic pain   . Neuropathy (HCC)   . OAB (overactive bladder)   . Obesity   . Oral pain   . Parkinson disease (HCC)   . Pneumonia   . Protein calorie malnutrition (HCC)   . Rosacea   . Slow transit constipation   . Type 2 diabetes mellitus with diabetic polyneuropathy, with long-term current use of insulin (HCC)   . Urinary tract infection without hematuria   . Vaginal atrophy   . Vitamin deficiency    Past Surgical History:  Procedure Laterality Date  . ABDOMINAL HYSTERECTOMY    . CERVICAL CONIZATION W/BX    . CHOLECYSTECTOMY    . REVISION TOTAL KNEE ARTHROPLASTY Right    Social History:   reports that she has never smoked. She has never used smokeless tobacco. She reports that she does not drink alcohol or use drugs.  Family History  Problem Relation Age of Onset  . Colon cancer  Father   . Colon cancer Sister   . Stomach cancer Sister   . Rectal cancer Sister   . Kidney disease Neg Hx   . Bladder Cancer Neg Hx   . Prostate cancer Neg Hx     Medications: Allergies as of 10/11/2016      Reactions   Codeine Itching   Morphine And Related Itching   Indocin [indomethacin] Rash      Medication List       Accurate as of 10/11/16 12:11 PM. Always use your most recent med list.          acetaminophen 325 MG  tablet Commonly known as:  TYLENOL Take 650 mg by mouth every 6 (six) hours as needed for mild pain.   alum & mag hydroxide-simeth 200-200-20 MG/5ML suspension Commonly known as:  MAALOX/MYLANTA Take 30 mLs by mouth every 6 (six) hours as needed for indigestion or heartburn.   amLODipine 5 MG tablet Commonly known as:  NORVASC Take 1 tablet (5 mg total) by mouth daily.   amoxicillin 500 MG tablet Commonly known as:  AMOXIL Take 1 tablet (500 mg total) by mouth 3 (three) times daily. Discontinue after 10/14/2016 doses.   ARIPiprazole 2 MG tablet Commonly known as:  ABILIFY Take 2 mg by mouth daily.   aspirin 81 MG EC tablet Take 81 mg by mouth daily. Swallow whole.   atorvastatin 10 MG tablet Commonly known as:  LIPITOR Take 10 mg by mouth daily.   bisacodyl 10 MG suppository Commonly known as:  DULCOLAX Place 10 mg rectally daily as needed for moderate constipation.   carbidopa-levodopa 25-100 MG disintegrating tablet Commonly known as:  PARCOPA Take 1 tablet by mouth 2 (two) times daily.   conjugated estrogens vaginal cream Commonly known as:  PREMARIN Place 1 Applicatorful vaginally every Monday, Wednesday, and Friday. Apply 0.5mg  (pea-sized amount)  just inside the vaginal introitus with a finger-tip every Monday, Wednesday and Friday nights.   Cranberry 200 MG Caps Take 400 mg by mouth daily. Take two 200-mg capsules = 400 mg   DULoxetine 60 MG capsule Commonly known as:  CYMBALTA Take 60 mg by mouth daily.   gabapentin 100 MG capsule Commonly known as:  NEURONTIN Take 200 mg by mouth at bedtime.   insulin glargine 100 UNIT/ML injection Commonly known as:  LANTUS Inject 0.35 mLs (35 Units total) into the skin daily.   insulin lispro 100 UNIT/ML injection Commonly known as:  HUMALOG Inject 8-12 Units into the skin See admin instructions. Check CBG BID for CBG of 200-399 give 8 unit; for CBG>400 give 12 units hold for CBG <200-   lamoTRIgine 100 MG  tablet Commonly known as:  LAMICTAL Take 100 mg by mouth 2 (two) times daily.   lisinopril 20 MG tablet Commonly known as:  PRINIVIL,ZESTRIL Take 20 mg by mouth daily.   loratadine 10 MG tablet Commonly known as:  CLARITIN Take 10 mg by mouth daily.   magnesium oxide 400 MG tablet Commonly known as:  MAG-OX Take 400 mg by mouth daily.   metoprolol tartrate 25 MG tablet Commonly known as:  LOPRESSOR Take 25 mg by mouth 2 (two) times daily. Take 25 mg by mouth twice a day for HTN   METROLOTION 0.75 % Lotn Generic drug:  METRONIDAZOLE (TOPICAL) Apply 1 application topically 2 (two) times daily. Starting 08/12/16. To face, for rosacea.   multivitamin-lutein Caps capsule Take 1 capsule by mouth daily.   oxybutynin 5 MG tablet Commonly known as:  DITROPAN Take 5 mg by mouth 2 (two) times daily.   pantoprazole 40 MG tablet Commonly known as:  PROTONIX Take 40 mg by mouth daily.   polyethylene glycol packet Commonly known as:  MIRALAX / GLYCOLAX Take 17 g by mouth 2 (two) times daily.   PROCEL PO Take 1 scoop by mouth 2 (two) times daily.   sennosides-docusate sodium 8.6-50 MG tablet Commonly known as:  SENOKOT-S Take 2 tablets by mouth 2 (two) times daily.   SYSTANE BALANCE OP Apply 1 drop to eye 2 (two) times daily. Both eyes       Immunizations: Immunization History  Administered Date(s) Administered  . Influenza-Unspecified 06/20/2016  . PPD Test 01/16/2015  . Tdap 08/19/2016     Physical Exam:  Vitals:   10/11/16 1202  BP: (!) 121/50  Pulse: 87  Resp: 16  Temp: 97.5 F (36.4 C)  TempSrc: Oral  SpO2: 94%  Weight: 236 lb 12.8 oz (107.4 kg)  Height: 5\' 6"  (1.676 m)   Body mass index is 38.22 kg/m.  General- elderly female, Obese, in no acute distress  Head- normocephalic, atraumatic Nose- no maxillary or frontal sinus tenderness, no nasal discharge Throat- moist mucus membrane Eyes- PERRLA, EOMI, no pallor, no icterus, no discharge, normal  conjunctiva, normal sclera Neck- no cervical lymphadenopathy Cardiovascular- normal s1,s2, no murmur Respiratory- bilateral clear to auscultation, no wheeze, no rhonchi, no crackles, no use of accessory muscles Abdomen- bowel sounds present, soft, non tender Musculoskeletal- able to move all 4 extremities, generalized weakness with limited range of motion, trace leg edema, old surgical scar to both knees Neurological- alert and oriented to person, place and time this visit Skin- warm and dry Psychiatry- normal mood and affect    Labs reviewed: Basic Metabolic Panel:  Recent Labs  16/10/96 0644 10/07/16 0700 10/08/16 0643  NA 142 141 139  K 3.4* 3.4* 4.2  CL 112* 110 108  CO2 23 23 23   GLUCOSE 185* 132* 154*  BUN 16 14 11   CREATININE 0.90 0.92 0.77  CALCIUM 8.2* 8.3* 9.0  MG  --  2.0  --    Liver Function Tests:  Recent Labs  08/24/16 1305  08/31/16 10/05/16 0931 10/08/16 0643  AST 22  < > 17 21 20   ALT 9*  < > 13 9* 6*  ALKPHOS 91  < > 87 115 100  BILITOT 0.7  --   --  1.6* 0.4  PROT 6.7  --   --  7.6 6.9  ALBUMIN 3.1*  --   --  3.1* 2.6*  < > = values in this interval not displayed. No results for input(s): LIPASE, AMYLASE in the last 8760 hours. No results for input(s): AMMONIA in the last 8760 hours. CBC:  Recent Labs  08/24/16 0102 08/24/16 1305  10/05/16 0931 10/06/16 0644 10/07/16 0700 10/08/16 0643  WBC 11.3* 11.0*  < > 18.4* 14.4* 15.0* 12.2*  NEUTROABS 8.0* 8.0*  --  15.3*  --   --   --   HGB 13.4 13.1  < > 16.3* 12.3 11.7* 13.1  HCT 40.2 39.6  < > 48.2* 36.8 35.4* 39.7  MCV 91.4 90.8  < > 91.1 92.2 93.4 91.9  PLT 235 227  < > 265 223 232 285  < > = values in this interval not displayed. Cardiac Enzymes:  Recent Labs  08/24/16 1305  TROPONINI <0.03   BNP: Invalid input(s): POCBNP CBG:  Recent Labs  10/08/16 2156 10/09/16 0817 10/09/16 1232  GLUCAP 153* 146* 173*    Radiological Exams: Dg Chest Portable 1 View  Result Date:  10/05/2016 CLINICAL DATA:  Fever.  Unresponsive.  Lethargy. EXAM: PORTABLE CHEST 1 VIEW COMPARISON:  08/24/2016. FINDINGS: Normal heart size. Aortic atherosclerosis is identified. No pleural effusion or edema. No airspace opacities. IMPRESSION: 1. No acute cardiopulmonary abnormalities. 2. Aortic atherosclerosis. Electronically Signed   By: Signa Kellaylor  Stroud M.D.   On: 10/05/2016 09:57   Dg Abd Portable 1v  Result Date: 10/08/2016 CLINICAL DATA:  Abdominal pain.  Fever. EXAM: PORTABLE ABDOMEN - 1 VIEW COMPARISON:  One-view abdomen 03/25/2015 FINDINGS: The heart is enlarged. Lung bases are clear. The bowel gas pattern is normal. Degenerative changes are present in the lumbar spine and left hip. Right total hip arthroplasty is noted. IMPRESSION: 1. Normal bowel gas pattern. 2. Cardiomegaly without failure. 3. Degenerative changes in the lumbar spine are stable. Electronically Signed   By: Marin Robertshristopher  Mattern M.D.   On: 10/08/2016 15:25    Assessment/Plan  Generalized weakness From deconditioning from recent urinary tract infection requiring hospitalization. Will need for her to work with physical therapy and occupational therapy team to help restore her strength and balance. She is a high fall risk. Fall precautions to be taken.  Escherichia coli urinary tract infection Continue amoxicillin 500 mg 3 times a day until 10/14/2016. Hydration to be encouraged. Perineal hygiene to be maintained with perineal check every 2-3 hours. Continue cranberry supplement.  Type 2 diabetes mellitus with neuropathy Lab Results  Component Value Date   HGBA1C 9.8 (H) 10/05/2016   Reviewed hemoglobin A1c. Her Amaryl was discontinued this hospitalization. Continue gabapentin at bedtime. Her Lantus was also decreased from 35 units twice a day to once a day. Continue Lantus 35 units daily for now. Continue sliding scale insulin with meals.  Gastroesophageal reflux disease Currently on pantoprazole 40 mg daily and Maalox  30 mL every 6 hours as needed  Hypertension Monitor blood pressure reading. Continue amlodipine 5 mg daily, Lopressor 25 mg twice a day, lisinopril 20 mg daily and baby aspirin.  Bipolar disorder Continue Abilify 2 mg daily and Cymbalta 60 mg daily with Lamictal 100 mg twice a day  Hyperlipidemia Continue her atorvastatin.  Parkinson's disease Continue her carbidopa-levodopa, no changes made.   Goals of care: Long-term care   Labs/tests ordered: CBC, BMP 10/17/16  Family/ staff Communication: reviewed care plan with patient and nursing supervisor    Oneal GroutMAHIMA Raad Clayson, MD Internal Medicine [facility] [address] [place], [place] [address] Cell Phone (Monday-Friday 8 am - 5 pm): [phone] On Call: [phone] and follow prompts after 5 pm and on weekends Office Phone: [phone] Office Fax: [phone]

## 2016-10-14 LAB — BASIC METABOLIC PANEL
BUN: 16 mg/dL (ref 4–21)
Creatinine: 0.7 mg/dL (ref 0.5–1.1)
GLUCOSE: 170 mg/dL
POTASSIUM: 4.3 mmol/L (ref 3.4–5.3)
Sodium: 141 mmol/L (ref 137–147)

## 2016-10-14 LAB — CBC AND DIFFERENTIAL
HCT: 38 % (ref 36–46)
Hemoglobin: 12.5 g/dL (ref 12.0–16.0)
Neutrophils Absolute: 8 /uL
PLATELETS: 326 10*3/uL (ref 150–399)
WBC: 12 10*3/mL

## 2016-10-17 LAB — CBC AND DIFFERENTIAL
HEMATOCRIT: 35 % — AB (ref 36–46)
HEMOGLOBIN: 11.5 g/dL — AB (ref 12.0–16.0)
NEUTROS ABS: 7 /uL
Platelets: 293 10*3/uL (ref 150–399)
WBC: 10.4 10^3/mL

## 2016-10-25 LAB — BASIC METABOLIC PANEL
BUN: 19 mg/dL (ref 4–21)
CREATININE: 0.8 mg/dL (ref 0.5–1.1)
Glucose: 204 mg/dL
POTASSIUM: 4.2 mmol/L (ref 3.4–5.3)
SODIUM: 141 mmol/L (ref 137–147)

## 2016-11-18 ENCOUNTER — Non-Acute Institutional Stay (SKILLED_NURSING_FACILITY): Payer: Medicare Other | Admitting: Adult Health

## 2016-11-18 ENCOUNTER — Encounter: Payer: Self-pay | Admitting: Adult Health

## 2016-11-18 DIAGNOSIS — K219 Gastro-esophageal reflux disease without esophagitis: Secondary | ICD-10-CM

## 2016-11-18 DIAGNOSIS — F3130 Bipolar disorder, current episode depressed, mild or moderate severity, unspecified: Secondary | ICD-10-CM

## 2016-11-18 DIAGNOSIS — G20 Parkinson's disease: Secondary | ICD-10-CM

## 2016-11-18 DIAGNOSIS — Z794 Long term (current) use of insulin: Secondary | ICD-10-CM | POA: Diagnosis not present

## 2016-11-18 DIAGNOSIS — N3281 Overactive bladder: Secondary | ICD-10-CM

## 2016-11-18 DIAGNOSIS — E785 Hyperlipidemia, unspecified: Secondary | ICD-10-CM

## 2016-11-18 DIAGNOSIS — G629 Polyneuropathy, unspecified: Secondary | ICD-10-CM

## 2016-11-18 DIAGNOSIS — K5900 Constipation, unspecified: Secondary | ICD-10-CM

## 2016-11-18 DIAGNOSIS — E1140 Type 2 diabetes mellitus with diabetic neuropathy, unspecified: Secondary | ICD-10-CM

## 2016-11-18 DIAGNOSIS — G20A1 Parkinson's disease without dyskinesia, without mention of fluctuations: Secondary | ICD-10-CM

## 2016-11-18 DIAGNOSIS — I10 Essential (primary) hypertension: Secondary | ICD-10-CM

## 2016-11-18 NOTE — Progress Notes (Signed)
DATE:  11/18/2016   MRN:  [id number]  BIRTHDAY: Mar 14, 1935  Facility:  Nursing Home Location:  [facility]  Nursing Home Room Number: 905-A  LEVEL OF CARE:  SNF [phone])  Contact Information    Name Relation Home Work [place] Daughter [phone]     Katrina Stack Daughter [phone]  [phone]       Code Status History    Date Active Date Inactive Code Status Order ID Comments User Context   10/05/2016 12:17 PM 10/09/2016  6:37 PM Full Code [id number]  Gwenyth Bender, NP ED   08/24/2016  8:19 PM 08/27/2016  7:12 PM Full Code [id number]  Narda Bonds, MD Inpatient       Chief Complaint  Patient presents with  . Medical Management of Chronic Issues    HISTORY OF PRESENT ILLNESS:  This is a 81-YO female seen for a routine visit.  She is a long-term care resident at [facility]. She was recently discharged from PT and OT.    She was seen in the room today while holding a doll that she ordered. She did not voice any concerns.    PAST MEDICAL HISTORY:  Past Medical History:  Diagnosis Date  . Allergic rhinitis   . Ascorbic acid deficiency   . Bipolar affective disorder (HCC)   . Candidiasis   . Charcot's joint   . Chronic constipation   . Dysuria   . GERD (gastroesophageal reflux disease)   . History of falling   . HLD (hyperlipidemia)   . HTN (hypertension)   . Magnesium deficiency   . Major depressive disorder   . Mixed incontinence   . Muscle weakness   . Neuropathic pain   . Neuropathy (HCC)   . OAB (overactive bladder)   . Obesity   . Oral pain   . Parkinson disease (HCC)   . Pneumonia   . Protein calorie malnutrition (HCC)   . Rosacea   . Slow transit constipation   . Type 2 diabetes mellitus with diabetic polyneuropathy, with long-term current use of insulin (HCC)   . Urinary tract infection without hematuria   . Vaginal atrophy   . Vitamin deficiency      CURRENT MEDICATIONS:  Reviewed  Patient's Medications  New Prescriptions   No medications on file  Previous Medications   ACETAMINOPHEN (TYLENOL) 325 MG TABLET    Take 650 mg by mouth every 6 (six) hours as needed for mild pain.    ALUM & MAG HYDROXIDE-SIMETH (MAALOX/MYLANTA) 200-200-20 MG/5ML SUSPENSION    Take 30 mLs by mouth every 6 (six) hours as needed for indigestion or heartburn.   AMLODIPINE (NORVASC) 5 MG TABLET    Take 1 tablet (5 mg total) by mouth daily.   ARIPIPRAZOLE (ABILIFY) 2 MG TABLET    Take 2 mg by mouth daily.   ASPIRIN 81 MG EC TABLET    Take 81 mg by mouth daily. Swallow whole.   ATORVASTATIN (LIPITOR) 10 MG TABLET    Take 10 mg by mouth daily.   BISACODYL (DULCOLAX) 10 MG SUPPOSITORY    Place 10 mg rectally daily as needed for moderate constipation.   CARBIDOPA-LEVODOPA (PARCOPA) 25-100 MG DISINTEGRATING TABLET    Take 1 tablet by mouth 2 (two) times daily.    CONJUGATED ESTROGENS (PREMARIN) VAGINAL CREAM    Place 1 Applicatorful vaginally every Monday, Wednesday, and Friday. Apply 0.5mg  (pea-sized amount)  just inside the vaginal introitus with  a finger-tip every Monday, Wednesday and Friday nights.   CRANBERRY 200 MG CAPS    Take 400 mg by mouth daily. Take two 200-mg capsules = 400 mg   DULOXETINE (CYMBALTA) 60 MG CAPSULE    Take 60 mg by mouth daily.   GABAPENTIN (NEURONTIN) 100 MG CAPSULE    Take 200 mg by mouth at bedtime.    INSULIN GLARGINE (LANTUS) 100 UNIT/ML INJECTION    Inject 0.35 mLs (35 Units total) into the skin daily.   INSULIN LISPRO (HUMALOG) 100 UNIT/ML INJECTION    Inject 8-12 Units into the skin See admin instructions. Check CBG BID for CBG of 200-399 give 8 unit; for CBG>400 give 12 units hold for CBG <200-   LAMOTRIGINE (LAMICTAL) 100 MG TABLET    Take 100 mg by mouth 2 (two) times daily.    LISINOPRIL (PRINIVIL,ZESTRIL) 20 MG TABLET    Take 20 mg by mouth daily.    LORATADINE (CLARITIN) 10 MG TABLET    Take 10 mg by mouth daily.    MAGNESIUM OXIDE (MAG-OX) 400 MG  TABLET    Take 400 mg by mouth daily.   METOPROLOL TARTRATE (LOPRESSOR) 25 MG TABLET    Take 25 mg by mouth 2 (two) times daily. Take 25 mg by mouth twice a day for HTN   METRONIDAZOLE, TOPICAL, (METROLOTION) 0.75 % LOTN    Apply 1 application topically 2 (two) times daily. Starting 08/12/16. To face, for rosacea.   MULTIVITAMIN-LUTEIN (OCUVITE-LUTEIN) CAPS CAPSULE    Take 1 capsule by mouth daily.   OXYBUTYNIN (DITROPAN) 5 MG TABLET    Take 5 mg by mouth 2 (two) times daily.    PANTOPRAZOLE (PROTONIX) 40 MG TABLET    Take 40 mg by mouth daily.    POLYETHYLENE GLYCOL (MIRALAX / GLYCOLAX) PACKET    Take 17 g by mouth 2 (two) times daily.   PROPYLENE GLYCOL (SYSTANE BALANCE OP)    Apply 1 drop to eye 2 (two) times daily. Both eyes   PROTEIN (PROCEL PO)    Take 1 scoop by mouth 2 (two) times daily.    SENNOSIDES-DOCUSATE SODIUM (SENOKOT-S) 8.6-50 MG TABLET    Take 2 tablets by mouth 2 (two) times daily.  Modified Medications   No medications on file  Discontinued Medications   AMOXICILLIN (AMOXIL) 500 MG TABLET    Take 1 tablet (500 mg total) by mouth 3 (three) times daily. Discontinue after 10/14/2016 doses.     Allergies  Allergen Reactions  . Codeine Itching  . Morphine And Related Itching  . Indocin [Indomethacin] Rash     REVIEW OF SYSTEMS:  GENERAL: no change in appetite, no fatigue, no weight changes, no fever, chills or weakness EYES: Denies change in vision, dry eyes, eye pain, itching or discharge EARS: Denies change in hearing, ringing in ears, or earache NOSE: Denies nasal congestion or epistaxis MOUTH and THROAT: Denies oral discomfort, gingival pain or bleeding, pain from teeth or hoarseness   RESPIRATORY: no cough, SOB, DOE, wheezing, hemoptysis CARDIAC: no chest pain, edema or palpitations GI: no abdominal pain, diarrhea, constipation, heart burn, nausea or vomiting GU: Denies dysuria, frequency, hematuria, incontinence, or discharge PSYCHIATRIC: Denies feeling of  depression or anxiety. No report of hallucinations, insomnia, paranoia, or agitation     PHYSICAL EXAMINATION  GENERAL APPEARANCE: Well nourished. In no acute distress. Obese SKIN:  Skin is warm and dry.  HEAD: Normal in size and contour. No evidence of trauma EYES: Lids open and close normally.  No blepharitis, entropion or ectropion. PERRL. Conjunctivae are clear and sclerae are white. Lenses are without opacity EARS: Pinnae are normal. Patient hears normal voice tunes of the examiner MOUTH and THROAT: Lips are without lesions. Oral mucosa is moist and without lesions. Tongue is normal in shape, size, and color and without lesions NECK: supple, trachea midline, no neck masses, no thyroid tenderness, no thyromegaly LYMPHATICS: no LAN in the neck, no supraclavicular LAN RESPIRATORY: breathing is even & unlabored, BS CTAB CARDIAC: RRR, no murmur,no extra heart sounds, no edema GI: abdomen soft, normal BS, no masses, no tenderness, no hepatomegaly, no splenomegaly EXTREMITIES:  Able to move X 4 extremities PSYCHIATRIC: Alert to person and place, disoriented to time. Affect and behavior are appropriate    LABS/RADIOLOGY: Labs reviewed: Basic Metabolic Panel:  Recent Labs  16/10/96 0644 10/07/16 0700 10/08/16 0643 10/14/16 10/25/16  NA 142 141 139 141 141  K 3.4* 3.4* 4.2 4.3 4.2  CL 112* 110 108  --   --   CO2 23 23 23   --   --   GLUCOSE 185* 132* 154*  --   --   BUN 16 14 11 16 19   CREATININE 0.90 0.92 0.77 0.7 0.8  CALCIUM 8.2* 8.3* 9.0  --   --   MG  --  2.0  --   --   --    Liver Function Tests:  Recent Labs  08/24/16 1305  08/31/16 10/05/16 0931 10/08/16 0643  AST 22  < > 17 21 20   ALT 9*  < > 13 9* 6*  ALKPHOS 91  < > 87 115 100  BILITOT 0.7  --   --  1.6* 0.4  PROT 6.7  --   --  7.6 6.9  ALBUMIN 3.1*  --   --  3.1* 2.6*  < > = values in this interval not displayed. CBC:  Recent Labs  10/05/16 0931 10/06/16 0644 10/07/16 0700 10/08/16 0643 10/14/16  10/17/16  WBC 18.4* 14.4* 15.0* 12.2* 12.0 10.4  NEUTROABS 15.3*  --   --   --  8 7  HGB 16.3* 12.3 11.7* 13.1 12.5 11.5*  HCT 48.2* 36.8 35.4* 39.7 38 35*  MCV 91.1 92.2 93.4 91.9  --   --   PLT 265 223 232 285 326 293   Lipid Panel:  Recent Labs  12/03/15 07/04/16  HDL 32* 35   Cardiac Enzymes:  Recent Labs  08/24/16 1305  TROPONINI <0.03   CBG:  Recent Labs  10/08/16 2156 10/09/16 0817 10/09/16 1232  GLUCAP 153* 146* 173*      ASSESSMENT/PLAN:  Parkinson's disease - continue Sinemet 25-100 mg by mouth twice a day  Bipolar disorder - continue lamotrigine 100 mg 1 tab by mouth twice a day, Abilify 2 mg 1 tab by mouth daily and Cymbalta 60 mg 1 capsule by mouth daily  Hypertension - continue metoprolol tartrate 25 mg 1 tab by mouth twice a day, lisinopril 20 mg 1 tab by mouth daily and amlodipine besylate 5 mg 1 tab by mouth daily  Constipation - continue MiraLAX 17 g by mouth twice a day, senna S 2 tabs by mouth twice a day, Dulcolax 10 mg suppository 1 rectally daily when necessary  Overactive bladder - continue oxybutynin 5 mg 1 tab by mouth twice a day  Neuropathy - stable; continue gabapentin 100 mg 2 capsules = 200 mg by mouth daily at bedtime  Hyperlipidemia - continue atorvastatin 10 mg 1 tab by mouth daily  GERD - continue Protonix 40 mg 1 tab by mouth daily  Diabetes mellitus, type II - continue Humalog 100 units/mL sliding scale subcutaneous twice a day and Lantus 100 units/mL inject 35 units subcutaneous daily Lab Results  Component Value Date   HGBA1C 9.8 (H) 10/05/2016      Goals of care:  Long-term care    Catherene Kaleta C. Medina-Vargas - NP    [facility] [phone]

## 2016-11-23 LAB — HM DIABETES EYE EXAM

## 2016-11-25 DIAGNOSIS — Z79899 Other long term (current) drug therapy: Secondary | ICD-10-CM | POA: Diagnosis not present

## 2016-12-07 DIAGNOSIS — F0280 Dementia in other diseases classified elsewhere without behavioral disturbance: Secondary | ICD-10-CM | POA: Diagnosis not present

## 2016-12-07 DIAGNOSIS — F329 Major depressive disorder, single episode, unspecified: Secondary | ICD-10-CM | POA: Diagnosis not present

## 2016-12-07 DIAGNOSIS — F3130 Bipolar disorder, current episode depressed, mild or moderate severity, unspecified: Secondary | ICD-10-CM | POA: Diagnosis not present

## 2016-12-07 DIAGNOSIS — F0390 Unspecified dementia without behavioral disturbance: Secondary | ICD-10-CM | POA: Diagnosis not present

## 2016-12-13 ENCOUNTER — Non-Acute Institutional Stay (SKILLED_NURSING_FACILITY): Payer: Medicare Other | Admitting: Adult Health

## 2016-12-13 ENCOUNTER — Encounter: Payer: Self-pay | Admitting: Adult Health

## 2016-12-13 DIAGNOSIS — E1140 Type 2 diabetes mellitus with diabetic neuropathy, unspecified: Secondary | ICD-10-CM

## 2016-12-13 DIAGNOSIS — Z794 Long term (current) use of insulin: Secondary | ICD-10-CM | POA: Diagnosis not present

## 2016-12-13 DIAGNOSIS — K5909 Other constipation: Secondary | ICD-10-CM | POA: Diagnosis not present

## 2016-12-13 DIAGNOSIS — G629 Polyneuropathy, unspecified: Secondary | ICD-10-CM

## 2016-12-13 DIAGNOSIS — N3281 Overactive bladder: Secondary | ICD-10-CM | POA: Diagnosis not present

## 2016-12-13 NOTE — Progress Notes (Signed)
DATE:  12/13/2016   MRN:  [id number]  BIRTHDAY: Feb 16, 1935  Facility:  Nursing Home Location:  [facility]  Nursing Home Room Number: 905-A  LEVEL OF CARE:  SNF [phone])  Contact Information    Name Relation Home Work [place]   Alexander,[name] Daughter [phone]     Katrina StackKerr,Jennifer L Daughter [phone]  [phone]       Code Status History    Date Active Date Inactive Code Status Order ID Comments User Context   10/05/2016 12:17 PM 10/09/2016  6:37 PM Full Code [id number]  Gwenyth BenderKaren M Black, NP ED   08/24/2016  8:19 PM 08/27/2016  7:12 PM Full Code [id number]  Narda Bondsalph A Nettey, MD Inpatient       Chief Complaint  Patient presents with  . Medical Management of Chronic Issues    HISTORY OF PRESENT ILLNESS:  This is a 81-YO female seen for a routine visit.  [facility]. She reports having constipation and noticed blood from her rectum. She also reports having noticed blood from her urine. No fever has been reported.She has recently finished Cipro course for UTI.   PAST MEDICAL HISTORY:  Past Medical History:  Diagnosis Date  . Allergic rhinitis   . Ascorbic acid deficiency   . Bipolar affective disorder (HCC)   . Candidiasis   . Charcot's joint   . Chronic constipation   . Dysuria   . GERD (gastroesophageal reflux disease)   . History of falling   . HLD (hyperlipidemia)   . HTN (hypertension)   . Magnesium deficiency   . Major depressive disorder   . Mixed incontinence   . Muscle weakness   . Neuropathic pain   . Neuropathy (HCC)   . OAB (overactive bladder)   . Obesity   . Oral pain   . Parkinson disease (HCC)   . Pneumonia   . Protein calorie malnutrition (HCC)   . Rosacea   . Slow transit constipation   . Type 2 diabetes mellitus with diabetic polyneuropathy, with long-term current use of insulin (HCC)   . Urinary tract infection without hematuria   . Vaginal atrophy   . Vitamin  deficiency      CURRENT MEDICATIONS: Reviewed  Patient's Medications  New Prescriptions   No medications on file  Previous Medications   ACETAMINOPHEN (TYLENOL) 325 MG TABLET    Take 650 mg by mouth every 6 (six) hours as needed for mild pain.    ALUM & MAG HYDROXIDE-SIMETH (MAALOX/MYLANTA) 200-200-20 MG/5ML SUSPENSION    Take 30 mLs by mouth every 6 (six) hours as needed for indigestion or heartburn.   AMLODIPINE (NORVASC) 5 MG TABLET    Take 1 tablet (5 mg total) by mouth daily.   ARIPIPRAZOLE (ABILIFY) 2 MG TABLET    Take 2 mg by mouth daily.   ASPIRIN 81 MG EC TABLET    Take 81 mg by mouth daily. Swallow whole.   ATORVASTATIN (LIPITOR) 10 MG TABLET    Take 10 mg by mouth daily.   BISACODYL (DULCOLAX) 10 MG SUPPOSITORY    Place 10 mg rectally daily as needed for moderate constipation.   CARBIDOPA-LEVODOPA (PARCOPA) 25-100 MG DISINTEGRATING TABLET    Take 1 tablet by mouth 2 (two) times daily.    CARBOXYMETHYLCELLULOSE 1 % OPHTHALMIC SOLUTION    1 drop 3 (three) times daily.   CONJUGATED ESTROGENS (PREMARIN) VAGINAL CREAM    Place 1 Applicatorful vaginally every  Monday, Wednesday, and Friday. Apply 0.5mg  (pea-sized amount)  just inside the vaginal introitus with a finger-tip every Monday, Wednesday and Friday nights.   CRANBERRY 200 MG CAPS    Take 400 mg by mouth daily. Take two 200-mg capsules = 400 mg   DULOXETINE (CYMBALTA) 60 MG CAPSULE    Take 60 mg by mouth daily.   GABAPENTIN (NEURONTIN) 100 MG CAPSULE    Take 200 mg by mouth at bedtime.    INSULIN GLARGINE (LANTUS) 100 UNIT/ML INJECTION    Inject 0.35 mLs (35 Units total) into the skin daily.   INSULIN LISPRO (HUMALOG) 100 UNIT/ML INJECTION    Inject 8-12 Units into the skin See admin instructions. Check CBG BID for CBG of 200-399 give 8 unit; for CBG>400 give 12 units hold for CBG <200-   LAMOTRIGINE (LAMICTAL) 100 MG TABLET    Take 100 mg by mouth 2 (two) times daily.    LISINOPRIL (PRINIVIL,ZESTRIL) 20 MG TABLET    Take 20 mg  by mouth daily.    LORATADINE (CLARITIN) 10 MG TABLET    Take 10 mg by mouth daily.    MAGNESIUM OXIDE (MAG-OX) 400 MG TABLET    Take 400 mg by mouth daily.   METOPROLOL TARTRATE (LOPRESSOR) 25 MG TABLET    Take 25 mg by mouth 2 (two) times daily. Take 25 mg by mouth twice a day for HTN   METRONIDAZOLE, TOPICAL, (METROLOTION) 0.75 % LOTN    Apply 1 application topically 2 (two) times daily. Starting 08/12/16. To face, for rosacea.   MULTIVITAMIN-LUTEIN (OCUVITE-LUTEIN) CAPS CAPSULE    Take 1 capsule by mouth daily.   OXYBUTYNIN (DITROPAN) 5 MG TABLET    Take 5 mg by mouth 2 (two) times daily.    PANTOPRAZOLE (PROTONIX) 40 MG TABLET    Take 40 mg by mouth daily.    POLYETHYLENE GLYCOL (MIRALAX / GLYCOLAX) PACKET    Take 17 g by mouth 2 (two) times daily.   PROTEIN (PROCEL PO)    Take 1 scoop by mouth 2 (two) times daily.    SENNOSIDES-DOCUSATE SODIUM (SENOKOT-S) 8.6-50 MG TABLET    Take 2 tablets by mouth 2 (two) times daily.  Modified Medications   No medications on file  Discontinued Medications   PROPYLENE GLYCOL (SYSTANE BALANCE OP)    Apply 1 drop to eye 2 (two) times daily. Both eyes     Allergies  Allergen Reactions  . Codeine Itching  . Morphine And Related Itching  . Indocin [Indomethacin] Rash     REVIEW OF SYSTEMS:  GENERAL: no change in appetite, no fatigue, no weight changes, no fever, chills or weakness EYES: Denies change in vision, dry eyes, eye pain, itching or discharge EARS: Denies change in hearing, ringing in ears, or earache NOSE: Denies nasal congestion or epistaxis MOUTH and THROAT: Denies oral discomfort, gingival pain or bleeding, pain from teeth or hoarseness   RESPIRATORY: no cough, SOB, DOE, wheezing, hemoptysis CARDIAC: no chest pain, edema or palpitations GI: no abdominal pain, diarrhea, heart burn, nausea or vomiting, + constipation GU: Denies dysuria, frequency, incontinence, or discharge, + hematuria PSYCHIATRIC: Denies feeling of depression or  anxiety. No report of hallucinations, insomnia, paranoia, or agitation    PHYSICAL EXAMINATION  GENERAL APPEARANCE: Well nourished. In no acute distress. Obese SKIN:  Skin is warm and dry.  HEAD: Normal in size and contour. No evidence of trauma EYES: Lids open and close normally. No blepharitis, entropion or ectropion. PERRL. Conjunctivae are clear and  sclerae are white. Lenses are without opacity EARS: Pinnae are normal. Patient hears normal voice tunes of the examiner MOUTH and THROAT: Lips are without lesions. Oral mucosa is moist and without lesions. Tongue is normal in shape, size, and color and without lesions NECK: supple, trachea midline, no neck masses, no thyroid tenderness, no thyromegaly LYMPHATICS: no LAN in the neck, no supraclavicular LAN RESPIRATORY: breathing is even & unlabored, BS CTAB CARDIAC: RRR, no murmur,no extra heart sounds, no edema GI: abdomen soft, normal BS, no masses, no tenderness, no hepatomegaly, no splenomegaly EXTREMITIES:  Able to move 4 extremities PSYCHIATRIC: Alert to time and person, disoriented to place.  Affect and behavior are appropriate    LABS/RADIOLOGY: Labs reviewed: Basic Metabolic Panel:  Recent Labs  40/98/11 0644 10/07/16 0700 10/08/16 0643 10/14/16 10/25/16  NA 142 141 139 141 141  K 3.4* 3.4* 4.2 4.3 4.2  CL 112* 110 108  --   --   CO2 23 23 23   --   --   GLUCOSE 185* 132* 154*  --   --   BUN 16 14 11 16 19   CREATININE 0.90 0.92 0.77 0.7 0.8  CALCIUM 8.2* 8.3* 9.0  --   --   MG  --  2.0  --   --   --    Liver Function Tests:  Recent Labs  08/24/16 1305  08/31/16 10/05/16 0931 10/08/16 0643  AST 22  < > 17 21 20   ALT 9*  < > 13 9* 6*  ALKPHOS 91  < > 87 115 100  BILITOT 0.7  --   --  1.6* 0.4  PROT 6.7  --   --  7.6 6.9  ALBUMIN 3.1*  --   --  3.1* 2.6*  < > = values in this interval not displayed. CBC:  Recent Labs  10/05/16 0931 10/06/16 0644 10/07/16 0700 10/08/16 0643 10/14/16 10/17/16  WBC  18.4* 14.4* 15.0* 12.2* 12.0 10.4  NEUTROABS 15.3*  --   --   --  8 7  HGB 16.3* 12.3 11.7* 13.1 12.5 11.5*  HCT 48.2* 36.8 35.4* 39.7 38 35*  MCV 91.1 92.2 93.4 91.9  --   --   PLT 265 223 232 285 326 293   Lipid Panel:  Recent Labs  07/04/16  HDL 35   Cardiac Enzymes:  Recent Labs  08/24/16 1305  TROPONINI <0.03   CBG:  Recent Labs  10/08/16 2156 10/09/16 0817 10/09/16 1232  GLUCAP 153* 146* 173*     ASSESSMENT/PLAN:  Overactive bladder - continue oxybutynin 5 mg 1 tab by mouth twice a day  Chronic constipation - start lenses 145 g 1 capsule by mouth daily, continue senna S 2 tabs by mouth twice a day and MiraLAX 17 g by mouth twice a day  Neuropathy - continue gabapentin 100 mg daily 2 capsules = 200 mg by mouth daily at bedtime  Diabetes mellitus, type II - continue Humalog 8 units subcutaneous for CBG 200 10/29/1997 and CBG > 400 give 12 units subcutaneous and to hold for CBG <200, Lantus 100 units/mL inject 35 units subcutaneous daily Lab Results  Component Value Date   HGBA1C 9.8 (H) 10/05/2016   Hypomagnesemia - continue magnesium 400 mg 1 tab by mouth daily; check magnesium level    Goals of care:  Long-term care   Aaryana Betke C. Medina-Vargas - NP    [employer] [phone]

## 2016-12-14 DIAGNOSIS — N390 Urinary tract infection, site not specified: Secondary | ICD-10-CM | POA: Diagnosis not present

## 2016-12-14 LAB — CBC AND DIFFERENTIAL
HEMATOCRIT: 43 % (ref 36–46)
HEMOGLOBIN: 14.4 g/dL (ref 12.0–16.0)
PLATELETS: 308 10*3/uL (ref 150–399)
WBC: 10.3 10*3/mL

## 2016-12-15 DIAGNOSIS — N390 Urinary tract infection, site not specified: Secondary | ICD-10-CM | POA: Diagnosis not present

## 2017-01-09 ENCOUNTER — Non-Acute Institutional Stay (SKILLED_NURSING_FACILITY): Payer: Medicare Other | Admitting: Adult Health

## 2017-01-09 ENCOUNTER — Encounter: Payer: Self-pay | Admitting: Adult Health

## 2017-01-09 DIAGNOSIS — L719 Rosacea, unspecified: Secondary | ICD-10-CM

## 2017-01-09 DIAGNOSIS — E1140 Type 2 diabetes mellitus with diabetic neuropathy, unspecified: Secondary | ICD-10-CM | POA: Diagnosis not present

## 2017-01-09 DIAGNOSIS — I10 Essential (primary) hypertension: Secondary | ICD-10-CM | POA: Diagnosis not present

## 2017-01-09 DIAGNOSIS — G20 Parkinson's disease: Secondary | ICD-10-CM | POA: Diagnosis not present

## 2017-01-09 DIAGNOSIS — Z794 Long term (current) use of insulin: Secondary | ICD-10-CM

## 2017-01-09 DIAGNOSIS — F3130 Bipolar disorder, current episode depressed, mild or moderate severity, unspecified: Secondary | ICD-10-CM

## 2017-01-09 NOTE — Progress Notes (Signed)
DATE:  01/09/2017   MRN:  [id number]  BIRTHDAY: 1935-07-19  Facility:  Nursing Home Location:  [facility]  Nursing Home Room Number: 905-A  LEVEL OF CARE:  SNF [phone])  Contact Information    Name Relation Home Work [place] Daughter [phone]     Katrina Stack Daughter [phone]  [phone]       Code Status History    Date Active Date Inactive Code Status Order ID Comments User Context   10/05/2016 12:17 PM 10/09/2016  6:37 PM Full Code [id number]  Gwenyth Bender, NP ED   08/24/2016  8:19 PM 08/27/2016  7:12 PM Full Code [id number]  Narda Bonds, MD Inpatient       Chief Complaint  Patient presents with  . Medical Management of Chronic Issues    HISTORY OF PRESENT ILLNESS:  This is a 81-YO female seen for a routine visit.  She is a long-term care resident of [facility]. She was seen in the room today and verbalized no concerns. Pharmacy consultant has recommended Lantus to be increased since CBGs has been in the 400s.   PAST MEDICAL HISTORY:  Past Medical History:  Diagnosis Date  . Acute encephalopathy   . Allergic rhinitis   . Ascorbic acid deficiency   . Bipolar affective disorder (HCC)   . Candidiasis   . Charcot's joint   . Chronic constipation   . Dysuria   . GERD (gastroesophageal reflux disease)   . History of falling   . HLD (hyperlipidemia)   . HTN (hypertension)   . Magnesium deficiency   . Major depressive disorder   . Mixed incontinence   . Morbid obesity (HCC)   . Muscle weakness   . Neuropathic pain   . Neuropathy   . OAB (overactive bladder)   . Obesity   . Oral pain   . Parkinson disease (HCC)   . Pneumonia   . Protein calorie malnutrition (HCC)   . Rosacea   . Sepsis (HCC) 09/2016  . Slow transit constipation   . Type 2 diabetes mellitus with diabetic polyneuropathy, with long-term current use of insulin (HCC)   . Urinary tract infection without hematuria   . Vaginal  atrophy   . Vitamin deficiency      CURRENT MEDICATIONS: Reviewed  Patient's Medications  New Prescriptions   No medications on file  Previous Medications   ACETAMINOPHEN (TYLENOL) 325 MG TABLET    Take 650 mg by mouth every 6 (six) hours as needed for mild pain.    ALUM & MAG HYDROXIDE-SIMETH (MAALOX/MYLANTA) 200-200-20 MG/5ML SUSPENSION    Take 30 mLs by mouth every 6 (six) hours as needed for indigestion or heartburn.   AMLODIPINE (NORVASC) 5 MG TABLET    Take 1 tablet (5 mg total) by mouth daily.   ARIPIPRAZOLE (ABILIFY) 2 MG TABLET    Take 2 mg by mouth daily.   ASPIRIN 81 MG EC TABLET    Take 81 mg by mouth daily. Swallow whole.   ATORVASTATIN (LIPITOR) 10 MG TABLET    Take 10 mg by mouth daily.   BISACODYL (DULCOLAX) 10 MG SUPPOSITORY    Place 10 mg rectally daily as needed for moderate constipation.   CARBIDOPA-LEVODOPA (PARCOPA) 25-100 MG DISINTEGRATING TABLET    Take 1 tablet by mouth 2 (two) times daily.    CARBOXYMETHYLCELLULOSE 1 % OPHTHALMIC SOLUTION    1 drop 3 (three) times daily.   CONJUGATED ESTROGENS (PREMARIN)  VAGINAL CREAM    Place 1 Applicatorful vaginally every Monday, Wednesday, and Friday. Apply 0.5mg  (pea-sized amount)  just inside the vaginal introitus with a finger-tip every Monday, Wednesday and Friday nights.   CRANBERRY 200 MG CAPS    Take 400 mg by mouth daily. Take two 200-mg capsules = 400 mg   DULOXETINE (CYMBALTA) 60 MG CAPSULE    Take 60 mg by mouth daily.   GABAPENTIN (NEURONTIN) 100 MG CAPSULE    Take 200 mg by mouth at bedtime.    INSULIN GLARGINE (LANTUS) 100 UNIT/ML INJECTION    Inject 0.35 mLs (35 Units total) into the skin daily.   INSULIN LISPRO (HUMALOG) 100 UNIT/ML INJECTION    Inject 8-12 Units into the skin See admin instructions. Check CBG BID for CBG of 200-399 give 8 unit; for CBG>400 give 12 units hold for CBG <200-   LAMOTRIGINE (LAMICTAL) 100 MG TABLET    Take 100 mg by mouth 2 (two) times daily.    LINACLOTIDE (LINZESS) 145 MCG CAPS  CAPSULE    Take 145 mcg by mouth daily before breakfast.   LISINOPRIL (PRINIVIL,ZESTRIL) 20 MG TABLET    Take 20 mg by mouth daily.    LORATADINE (CLARITIN) 10 MG TABLET    Take 10 mg by mouth daily.    MAGNESIUM OXIDE (MAG-OX) 400 MG TABLET    Take 400 mg by mouth daily.   METOPROLOL TARTRATE (LOPRESSOR) 25 MG TABLET    Take 25 mg by mouth 2 (two) times daily. Take 25 mg by mouth twice a day for HTN   METRONIDAZOLE, TOPICAL, (METROLOTION) 0.75 % LOTN    Apply 1 application topically 2 (two) times daily. Starting 08/12/16. To face, for rosacea.   MULTIVITAMIN-LUTEIN (OCUVITE-LUTEIN) CAPS CAPSULE    Take 1 capsule by mouth daily.   OXYBUTYNIN (DITROPAN) 5 MG TABLET    Take 5 mg by mouth 2 (two) times daily.    PANTOPRAZOLE (PROTONIX) 40 MG TABLET    Take 40 mg by mouth daily.    POLYETHYLENE GLYCOL (MIRALAX / GLYCOLAX) PACKET    Take 17 g by mouth 2 (two) times daily.   PROTEIN (PROCEL PO)    Take 1 scoop by mouth 2 (two) times daily.    SENNOSIDES-DOCUSATE SODIUM (SENOKOT-S) 8.6-50 MG TABLET    Take 2 tablets by mouth 2 (two) times daily.  Modified Medications   No medications on file  Discontinued Medications   No medications on file     Allergies  Allergen Reactions  . Codeine Itching  . Morphine And Related Itching  . Indocin [Indomethacin] Rash     REVIEW OF SYSTEMS:  GENERAL: no change in appetite, no fatigue, no weight changes, no fever, chills or weakness EYES: Denies change in vision, dry eyes, eye pain, itching or discharge EARS: Denies change in hearing, ringing in ears, or earache NOSE: Denies nasal congestion or epistaxis MOUTH and THROAT: Denies oral discomfort, gingival pain or bleeding, pain from teeth or hoarseness   RESPIRATORY: no cough, SOB, DOE, wheezing, hemoptysis CARDIAC: no chest pain, edema or palpitations GI: no abdominal pain, diarrhea, heart burn, nausea or vomiting GU: Denies dysuria, frequency, incontinence, or discharge PSYCHIATRIC: Denies feeling  of depression or anxiety. No report of hallucinations, insomnia, paranoia, or agitation    PHYSICAL EXAMINATION  GENERAL APPEARANCE: Well nourished. In no acute distress. Obese SKIN:  Skin is warm and dry.  HEAD: Normal in size and contour. No evidence of trauma EYES: Lids open and close normally.  No blepharitis, entropion or ectropion. PERRL. Conjunctivae are clear and sclerae are white. Lenses are without opacity EARS: Pinnae are normal. Patient hears normal voice tunes of the examiner MOUTH and THROAT: Lips are without lesions. Oral mucosa is moist and without lesions. Tongue is normal in shape, size, and color and without lesions NECK: supple, trachea midline, no neck masses, no thyroid tenderness, no thyromegaly LYMPHATICS: no LAN in the neck, no supraclavicular LAN RESPIRATORY: breathing is even & unlabored, BS CTAB CARDIAC: RRR, no murmur,no extra heart sounds, no edema GI: abdomen soft, normal BS, no masses, no tenderness, no hepatomegaly, no splenomegaly EXTREMITIES:  Able to move 4 extremities PSYCHIATRIC: Alert  And oriented X 3.  Affect and behavior are appropriate    LABS/RADIOLOGY: Labs reviewed: Basic Metabolic Panel:  Recent Labs  40/98/11 0644 10/07/16 0700 10/08/16 0643 10/14/16 10/25/16  NA 142 141 139 141 141  K 3.4* 3.4* 4.2 4.3 4.2  CL 112* 110 108  --   --   CO2 --   --   GLUCOSE 185* 132* 154*  --   --   BUN CREATININE 0.90 0.92 0.77 0.7 0.8  CALCIUM 8.2* 8.3* 9.0  --   --   MG  --  2.0  --   --   --    Liver Function Tests:  Recent Labs  08/24/16 1305  08/31/16 10/05/16 0931 10/08/16 0643  AST 22  < > ALT 9*  < > 13 9* 6*  ALKPHOS 91  < > 87 115 100  BILITOT 0.7  --   --  1.6* 0.4  PROT 6.7  --   --  7.6 6.9  ALBUMIN 3.1*  --   --  3.1* 2.6*  < > = values in this interval not displayed. CBC:  Recent Labs  10/05/16 0931 10/06/16 0644 10/07/16 0700 10/08/16 0643 10/14/16 10/17/16 12/14/16  WBC  18.4* 14.4* 15.0* 12.2* 12.0 10.4 10.3  NEUTROABS 15.3*  --   --   --  8 7  --   HGB 16.3* 12.3 11.7* 13.1 12.5 11.5* 14.4  HCT 48.2* 36.8 35.4* 39.7 38 35* 43  MCV 91.1 92.2 93.4 91.9  --   --   --   PLT 265 223 232 285 326 293 308   Lipid Panel:  Recent Labs  07/04/16  HDL 35   Cardiac Enzymes:  Recent Labs  08/24/16 1305  TROPONINI <0.03   CBG:  Recent Labs  10/08/16 2156 10/09/16 0817 10/09/16 1232  GLUCAP 153* 146* 173*     ASSESSMENT/PLAN:  Diabetes Mellitus, type 2 - CBGs has been in the 400s and last hgbA1c taken 1/18 was 9.8, will increase Lantus from 35 to 40 units SQ Q D and continue Humalog  Sliding scale BID; will check hgbA1c   Rosacea - minimal facial skin redness noted; continue metronidazole 0.75% lotion applied topically to face twice a day  Parkinson's disease - able to walk with walker without tremors noted; continue Sinemet 25-100 mg ODT 1 capsule by mouth twice a day  Hypertension - well controlled; continue metoprolol titrate 25 mg 1 tab by mouth twice a day and lisinopril 20 mg 1 tab by mouth daily; check BMP  Bipolar disorder - no inappropriate behavior has been noted; continue Abilify 2 mg 1 tab by mouth daily and Cymbalta 60 mg 1 capsule by mouth daily     Goals of care:  Long-term care   Monina C. Medina-Vargas - NP    [employer] [employer] [phone]

## 2017-01-10 DIAGNOSIS — E08319 Diabetes mellitus due to underlying condition with unspecified diabetic retinopathy without macular edema: Secondary | ICD-10-CM | POA: Diagnosis not present

## 2017-01-10 DIAGNOSIS — E083419 Diabetes mellitus due to underlying condition with severe nonproliferative diabetic retinopathy with macular edema, unspecified eye: Secondary | ICD-10-CM | POA: Diagnosis not present

## 2017-01-10 LAB — BASIC METABOLIC PANEL
BUN: 19 mg/dL (ref 4–21)
CREATININE: 0.9 mg/dL (ref 0.5–1.1)
GLUCOSE: 498 mg/dL
POTASSIUM: 4.5 mmol/L (ref 3.4–5.3)
SODIUM: 137 mmol/L (ref 137–147)

## 2017-01-10 LAB — HEMOGLOBIN A1C: Hemoglobin A1C: 13.3

## 2017-01-11 ENCOUNTER — Emergency Department (HOSPITAL_COMMUNITY)
Admission: EM | Admit: 2017-01-11 | Discharge: 2017-01-12 | Disposition: A | Discharge status: Home / Self Care | Payer: Medicare Other | Attending: Emergency Medicine | Admitting: Emergency Medicine

## 2017-01-11 ENCOUNTER — Encounter (HOSPITAL_COMMUNITY): Payer: Self-pay | Admitting: Family Medicine

## 2017-01-11 ENCOUNTER — Emergency Department (HOSPITAL_COMMUNITY): Payer: Medicare Other

## 2017-01-11 DIAGNOSIS — E1140 Type 2 diabetes mellitus with diabetic neuropathy, unspecified: Secondary | ICD-10-CM | POA: Diagnosis not present

## 2017-01-11 DIAGNOSIS — G20 Parkinson's disease: Secondary | ICD-10-CM | POA: Diagnosis not present

## 2017-01-11 DIAGNOSIS — Z7982 Long term (current) use of aspirin: Secondary | ICD-10-CM | POA: Insufficient documentation

## 2017-01-11 DIAGNOSIS — I10 Essential (primary) hypertension: Secondary | ICD-10-CM | POA: Insufficient documentation

## 2017-01-11 DIAGNOSIS — Z79899 Other long term (current) drug therapy: Secondary | ICD-10-CM | POA: Diagnosis not present

## 2017-01-11 DIAGNOSIS — E139 Other specified diabetes mellitus without complications: Secondary | ICD-10-CM | POA: Diagnosis not present

## 2017-01-11 DIAGNOSIS — R739 Hyperglycemia, unspecified: Secondary | ICD-10-CM

## 2017-01-11 DIAGNOSIS — Z794 Long term (current) use of insulin: Secondary | ICD-10-CM | POA: Diagnosis not present

## 2017-01-11 DIAGNOSIS — Z96651 Presence of right artificial knee joint: Secondary | ICD-10-CM | POA: Diagnosis not present

## 2017-01-11 DIAGNOSIS — E1365 Other specified diabetes mellitus with hyperglycemia: Secondary | ICD-10-CM | POA: Diagnosis not present

## 2017-01-11 DIAGNOSIS — E1165 Type 2 diabetes mellitus with hyperglycemia: Secondary | ICD-10-CM | POA: Insufficient documentation

## 2017-01-11 DIAGNOSIS — N390 Urinary tract infection, site not specified: Secondary | ICD-10-CM | POA: Diagnosis not present

## 2017-01-11 LAB — URINALYSIS, ROUTINE W REFLEX MICROSCOPIC
BILIRUBIN URINE: NEGATIVE
Ketones, ur: NEGATIVE mg/dL
NITRITE: NEGATIVE
PH: 5 (ref 5.0–8.0)
Protein, ur: 30 mg/dL — AB
Specific Gravity, Urine: 1.021 (ref 1.005–1.030)

## 2017-01-11 LAB — I-STAT CHEM 8, ED
BUN: 21 mg/dL — ABNORMAL HIGH (ref 6–20)
CHLORIDE: 98 mmol/L — AB (ref 101–111)
CREATININE: 0.9 mg/dL (ref 0.44–1.00)
Calcium, Ion: 1.17 mmol/L (ref 1.15–1.40)
GLUCOSE: 504 mg/dL — AB (ref 65–99)
HCT: 42 % (ref 36.0–46.0)
HEMOGLOBIN: 14.3 g/dL (ref 12.0–15.0)
POTASSIUM: 4.3 mmol/L (ref 3.5–5.1)
Sodium: 134 mmol/L — ABNORMAL LOW (ref 135–145)
TCO2: 26 mmol/L (ref 0–100)

## 2017-01-11 LAB — BLOOD GAS, VENOUS
Acid-Base Excess: 1.9 mmol/L (ref 0.0–2.0)
Bicarbonate: 27.2 mmol/L (ref 20.0–28.0)
Drawn by: 11249
FIO2: 0.21
O2 SAT: 33.2 %
PATIENT TEMPERATURE: 98.6
pCO2, Ven: 47.3 mmHg (ref 44.0–60.0)
pH, Ven: 7.378 (ref 7.250–7.430)

## 2017-01-11 LAB — CBG MONITORING, ED: GLUCOSE-CAPILLARY: 529 mg/dL — AB (ref 65–99)

## 2017-01-11 MED ORDER — DEXTROSE 5 % IV SOLN
1.0000 g | Freq: Once | INTRAVENOUS | Status: AC
  Administered 2017-01-12: 1 g via INTRAVENOUS
  Filled 2017-01-11: qty 10

## 2017-01-11 MED ORDER — SODIUM CHLORIDE 0.9 % IV BOLUS (SEPSIS)
1000.0000 mL | Freq: Once | INTRAVENOUS | Status: AC
  Administered 2017-01-11: 1000 mL via INTRAVENOUS

## 2017-01-11 NOTE — ED Provider Notes (Signed)
[facility]-EMERGENCY DEPT Provider Note   CSN: [id number] Arrival date & time: 01/11/17  2132 By signing my name below, I, Levon Hedger, attest that this documentation has been prepared under the direction and in the presence of non-physician practitioner, Antony Madura, PA-C. Electronically Signed: Levon Hedger, Scribe. 01/11/2017. 10:37 PM.    History   Chief Complaint Chief Complaint  Patient presents with  . Hyperglycemia    HPI Maureen Taylor is a 82 y.o. female, brought in by ambulance from [facility], with a history of DM with neuropathy, Parkinson's Disease, HTN, and HLD who presents to the Emergency Department complaining of hyperglycemia onset tonight. Per records, pt had her blood sugar checked tonight at 5 pm, which read high. She was given 15 units of Humalog with no significant relief. Her CBG levels were rechecked at 10 pm and was still reading high. Pt reports associated suprapubic pain, dysuria, increased urinary frequency, and polydipsia. No alleviating or modifying factors noted.  Pt states she has been compliant with her medications. She has no other acute complaints at this time.   The history is provided by the patient. No language interpreter was used.    Past Medical History:  Diagnosis Date  . Acute encephalopathy   . Allergic rhinitis   . Ascorbic acid deficiency   . Bipolar affective disorder (HCC)   . Candidiasis   . Charcot's joint   . Chronic constipation   . Dysuria   . GERD (gastroesophageal reflux disease)   . History of falling   . HLD (hyperlipidemia)   . HTN (hypertension)   . Magnesium deficiency   . Major depressive disorder   . Mixed incontinence   . Morbid obesity (HCC)   . Muscle weakness   . Neuropathic pain   . Neuropathy   . OAB (overactive bladder)   . Obesity   . Oral pain   . Parkinson disease (HCC)   . Pneumonia   . Protein calorie malnutrition (HCC)   . Rosacea   . Sepsis (HCC) 09/2016  . Slow transit  constipation   . Type 2 diabetes mellitus with diabetic polyneuropathy, with long-term current use of insulin (HCC)   . Urinary tract infection without hematuria   . Vaginal atrophy   . Vitamin deficiency     Patient Active Problem List   Diagnosis Date Noted  . Sepsis (HCC) 10/05/2016  . Acute encephalopathy 10/05/2016  . Diabetes mellitus with complication (HCC)   . Complicated UTI (urinary tract infection)   . Acute cystitis without hematuria   . Morbid obesity (HCC)   . UTI (urinary tract infection) 08/24/2016  . Altered mental status 08/24/2016  . Constipation 08/24/2016  . Hyperlipidemia 06/05/2015  . Type 2 diabetes mellitus with diabetic polyneuropathy (HCC) 06/05/2015  . Vitamin D deficiency 06/05/2015  . Recurrent UTI 04/17/2015  . Vaginal atrophy 04/17/2015  . Incontinence 04/17/2015  . OAB (overactive bladder) 03/03/2015  . Bipolar depression (HCC) 01/05/2015  . GERD (gastroesophageal reflux disease) 01/05/2015  . Allergic rhinitis 01/05/2015  . Essential hypertension 01/05/2015  . Candida infection 01/05/2015  . Parkinson's disease (HCC) 01/05/2015  . Neuropathy 01/05/2015  . Diabetes mellitus with neuropathy (HCC) 01/05/2015    Past Surgical History:  Procedure Laterality Date  . ABDOMINAL HYSTERECTOMY    . CERVICAL CONIZATION W/BX    . CHOLECYSTECTOMY    . REVISION TOTAL KNEE ARTHROPLASTY Right     OB History    No data available  Home Medications    Prior to Admission medications   Medication Sig Start Date End Date Taking? Authorizing Provider  acetaminophen (TYLENOL) 325 MG tablet Take 650 mg by mouth every 6 (six) hours as needed for mild pain.    Yes Historical Provider, MD  amLODipine (NORVASC) 5 MG tablet Take 1 tablet (5 mg total) by mouth daily. 10/10/16  Yes Elease Etienne, MD  ARIPiprazole (ABILIFY) 2 MG tablet Take 2 mg by mouth daily.   Yes Historical Provider, MD  aspirin 81 MG EC tablet Take 81 mg by mouth daily. Swallow whole.    Yes Historical Provider, MD  atorvastatin (LIPITOR) 10 MG tablet Take 10 mg by mouth daily.   Yes Historical Provider, MD  carbidopa-levodopa (PARCOPA) 25-100 MG disintegrating tablet Take 1 tablet by mouth 2 (two) times daily.    Yes Historical Provider, MD  carboxymethylcellulose 1 % ophthalmic solution Apply 1 drop to eye 3 (three) times daily.    Yes Historical Provider, MD  Cranberry 200 MG CAPS Take 2 capsules by mouth daily.   Yes Historical Provider, MD  DULoxetine (CYMBALTA) 60 MG capsule Take 60 mg by mouth daily.   Yes Historical Provider, MD  gabapentin (NEURONTIN) 100 MG capsule Take 200 mg by mouth at bedtime.    Yes Historical Provider, MD  insulin glargine (LANTUS) 100 UNIT/ML injection Inject 0.35 mLs (35 Units total) into the skin daily. Patient taking differently: Inject 40 Units into the skin daily.  10/09/16  Yes Elease Etienne, MD  insulin lispro (HUMALOG) 100 UNIT/ML injection Inject 8-12 Units into the skin See admin instructions. Check CBG BID for CBG of 200-399 give 8 unit; for CBG>400 give 12 units hold for CBG <200-   Yes Historical Provider, MD  lamoTRIgine (LAMICTAL) 100 MG tablet Take 100 mg by mouth 2 (two) times daily.    Yes Historical Provider, MD  linaclotide (LINZESS) 145 MCG CAPS capsule Take 145 mcg by mouth daily before breakfast.   Yes Historical Provider, MD  lisinopril (PRINIVIL,ZESTRIL) 20 MG tablet Take 20 mg by mouth daily.    Yes Historical Provider, MD  loratadine (CLARITIN) 10 MG tablet Take 10 mg by mouth daily.   Yes Historical Provider, MD  Magnesium 400 MG CAPS Take 1 capsule by mouth daily.   Yes Historical Provider, MD  metoprolol tartrate (LOPRESSOR) 25 MG tablet Take 25 mg by mouth 2 (two) times daily. Take 25 mg by mouth twice a day for HTN   Yes Historical Provider, MD  METRONIDAZOLE, TOPICAL, (METROLOTION) 0.75 % LOTN Apply 1 application topically 2 (two) times daily. Starting 08/12/16. To face, for rosacea.   Yes Historical Provider, MD    multivitamin-lutein (OCUVITE-LUTEIN) CAPS capsule Take 1 capsule by mouth daily.   Yes Historical Provider, MD  oxybutynin (DITROPAN) 5 MG tablet Take 5 mg by mouth 2 (two) times daily.    Yes Historical Provider, MD  pantoprazole (PROTONIX) 40 MG tablet Take 40 mg by mouth daily.    Yes Historical Provider, MD  polyethylene glycol (MIRALAX / GLYCOLAX) packet Take 17 g by mouth 2 (two) times daily.   Yes Historical Provider, MD  Protein (PROCEL 100) POWD Take 1 scoop by mouth 2 (two) times daily.   Yes Historical Provider, MD  sennosides-docusate sodium (SENOKOT-S) 8.6-50 MG tablet Take 2 tablets by mouth 2 (two) times daily.   Yes Historical Provider, MD  alum & mag hydroxide-simeth (MAALOX/MYLANTA) 200-200-20 MG/5ML suspension Take 30 mLs by mouth every 6 (six) hours  as needed for indigestion or heartburn. 10/09/16   Elease Etienne, MD  bisacodyl (DULCOLAX) 10 MG suppository Place 10 mg rectally daily as needed for moderate constipation.    Historical Provider, MD  cephALEXin (KEFLEX) 500 MG capsule Take 1 capsule (500 mg total) by mouth 2 (two) times daily. 01/12/17   Antony Madura, PA-C  conjugated estrogens (PREMARIN) vaginal cream Place 1 Applicatorful vaginally every Monday, Wednesday, and Friday. Apply 0.5mg  (pea-sized amount)  just inside the vaginal introitus with a finger-tip every Monday, Wednesday and Friday nights. 10/10/16   Elease Etienne, MD    Family History Family History  Problem Relation Age of Onset  . Colon cancer Father   . Colon cancer Sister   . Stomach cancer Sister   . Rectal cancer Sister   . Kidney disease Neg Hx   . Bladder Cancer Neg Hx   . Prostate cancer Neg Hx     Social History Social History  Substance Use Topics  . Smoking status: Never Smoker  . Smokeless tobacco: Never Used  . Alcohol use No    Allergies   Codeine; Morphine and related; and Indocin [indomethacin]   Review of Systems Review of Systems All systems reviewed and are negative  for acute change except as noted in the HPI.   Physical Exam Updated Vital Signs BP (!) 134/58 (BP Location: Left Arm)   Pulse 89   Temp 98.1 F (36.7 C) (Oral)   Resp 20   Ht  (1.676 m)   Wt 107 kg   SpO2 96%   BMI 38.09 kg/m   Physical Exam  Constitutional: She is oriented to person, place, and time. She appears well-developed and well-nourished. No distress.  Alert and nontoxic appearing  HENT:  Head: Normocephalic and atraumatic.  Eyes: Conjunctivae and EOM are normal. No scleral icterus.  Neck: Normal range of motion.  Cardiovascular: Normal rate, regular rhythm and intact distal pulses.   Pulmonary/Chest: Effort normal. No respiratory distress.  Respirations even and unlabored. Lungs CTAB.  Abdominal: Soft. She exhibits no distension and no mass. There is tenderness. There is no guarding.  Obese abdomen. Minimal suprapubic TTP. No rigidity or guarding.  Musculoskeletal: Normal range of motion.  Neurological: She is alert and oriented to person, place, and time. She exhibits normal muscle tone. Coordination normal.  Skin: Skin is warm and dry. No rash noted. She is not diaphoretic. No erythema. No pallor.  Psychiatric: She has a normal mood and affect. Her behavior is normal.  Nursing note and vitals reviewed.   ED Treatments / Results  DIAGNOSTIC STUDIES:  Oxygen Saturation is 93% on RA, adequate by my interpretation.    COORDINATION OF CARE:  10:36 PM Discussed treatment plan with pt at bedside and pt agreed to plan.   Labs (all labs ordered are listed, but only abnormal results are displayed) Labs Reviewed  CBC WITH DIFFERENTIAL/PLATELET - Abnormal; Notable for the following:       Result Value   WBC 11.6 (*)    Neutro Abs 7.8 (*)    All other components within normal limits  COMPREHENSIVE METABOLIC PANEL - Abnormal; Notable for the following:    Sodium 133 (*)    Chloride 97 (*)    Glucose, Bld 496 (*)    Albumin 3.4 (*)    ALT 8 (*)    Alkaline  Phosphatase 148 (*)    GFR calc non Af Amer 54 (*)    All other components within normal  limits  URINALYSIS, ROUTINE W REFLEX MICROSCOPIC - Abnormal; Notable for the following:    APPearance TURBID (*)    Glucose, UA >=500 (*)    Hgb urine dipstick MODERATE (*)    Protein, ur 30 (*)    Leukocytes, UA LARGE (*)    Bacteria, UA MANY (*)    Squamous Epithelial / LPF 0-5 (*)    All other components within normal limits  CBG MONITORING, ED - Abnormal; Notable for the following:    Glucose-Capillary 529 (*)    All other components within normal limits  I-STAT CHEM 8, ED - Abnormal; Notable for the following:    Sodium 134 (*)    Chloride 98 (*)    BUN 21 (*)    Glucose, Bld 504 (*)    All other components within normal limits  CBG MONITORING, ED - Abnormal; Notable for the following:    Glucose-Capillary 484 (*)    All other components within normal limits  CBG MONITORING, ED - Abnormal; Notable for the following:    Glucose-Capillary 354 (*)    All other components within normal limits  URINE CULTURE  BLOOD GAS, VENOUS    EKG  EKG Interpretation None       Radiology Dg Chest 2 View  Result Date: 01/11/2017 CLINICAL DATA:  Hyperglycemia. History of hypertension, Parkinson disease, pneumonia, diabetes. EXAM: CHEST  2 VIEW COMPARISON:  10/05/2016 FINDINGS: Mild cardiac enlargement with normal pulmonary vascularity. No focal airspace disease or consolidation in the lungs. No blunting of costophrenic angles. No pneumothorax. Mediastinal contours appear intact. Calcification of aorta. Degenerative changes in the spine and shoulders. No significant change since prior study. IMPRESSION: No evidence of active pulmonary disease.  Mild cardiac enlargement. Electronically Signed   By: Burman Nieves M.D.   On: 01/11/2017 23:44    Procedures Procedures (including critical care time)  Medications Ordered in ED Medications  sodium chloride 0.9 % bolus 1,000 mL (0 mLs Intravenous  Stopped 01/12/17 0105)  cefTRIAXone (ROCEPHIN) 1 g in dextrose 5 % 50 mL IVPB (0 g Intravenous Stopped 01/12/17 0105)  sodium chloride 0.9 % bolus 1,000 mL (0 mLs Intravenous Stopped 01/12/17 0255)     Initial Impression / Assessment and Plan / ED Course  I have reviewed the triage vital signs and the nursing notes.  Pertinent labs & imaging results that were available during my care of the patient were reviewed by me and considered in my medical decision making (see chart for details).     82 year old female presents to the emergency department from her facility for worsening hyperglycemia. Hyperglycemia found to be in the setting of acute urinary tract infection. Urine sent for culture. No evidence of diabetic ketoacidosis. Sugars improved with IV fluids. Patient started on antibiotics in the emergency department. Will discharge on Keflex. Return precautions discussed and provided. Patient discharged in stable condition, via [facility], back to her SNF.   Final Clinical Impressions(s) / ED Diagnoses   Final diagnoses:  Acute UTI  Hyperglycemia    New Prescriptions Discharge Medication List as of 01/12/2017  3:01 AM    START taking these medications   Details  cephALEXin (KEFLEX) 500 MG capsule Take 1 capsule (500 mg total) by mouth 2 (two) times daily., Starting Thu 01/12/2017, Print       I personally performed the services described in this documentation, which was scribed in my presence. The recorded information has been reviewed and is accurate.      Antony Madura, PA-C  01/21/17 8119    Mancel Bale, MD 01/21/17 [id number]

## 2017-01-11 NOTE — ED Notes (Signed)
Notified radiology that patient is ready for scan.

## 2017-01-11 NOTE — ED Notes (Signed)
Bed: WA01 Expected date:  Expected time:  Means of arrival:  Comments: EMS  Hyperglycemia

## 2017-01-11 NOTE — ED Triage Notes (Signed)
Patient is from [facility] and transported via [employer]. Pt's blood sugar at 17:00, read high. Pt was given 15U of Humalog. Recheck of blood sugar at 20:00, high reading. EMS reports patient is noncompliant of diabetes management.

## 2017-01-11 NOTE — ED Notes (Signed)
Attempted to obtain blood but was unsuccessful. Notified International aid/development worker (Press photographer) for assistance.

## 2017-01-11 NOTE — ED Notes (Signed)
CBG 529 

## 2017-01-12 DIAGNOSIS — E1165 Type 2 diabetes mellitus with hyperglycemia: Secondary | ICD-10-CM | POA: Diagnosis not present

## 2017-01-12 LAB — CBC WITH DIFFERENTIAL/PLATELET
Basophils Absolute: 0.1 10*3/uL (ref 0.0–0.1)
Basophils Relative: 1 %
EOS ABS: 0.4 10*3/uL (ref 0.0–0.7)
Eosinophils Relative: 4 %
HCT: 40.2 % (ref 36.0–46.0)
Hemoglobin: 13.6 g/dL (ref 12.0–15.0)
LYMPHS ABS: 2.3 10*3/uL (ref 0.7–4.0)
Lymphocytes Relative: 20 %
MCH: 29.6 pg (ref 26.0–34.0)
MCHC: 33.8 g/dL (ref 30.0–36.0)
MCV: 87.4 fL (ref 78.0–100.0)
Monocytes Absolute: 1 10*3/uL (ref 0.1–1.0)
Monocytes Relative: 8 %
Neutro Abs: 7.8 10*3/uL — ABNORMAL HIGH (ref 1.7–7.7)
Neutrophils Relative %: 67 %
PLATELETS: 279 10*3/uL (ref 150–400)
RBC: 4.6 MIL/uL (ref 3.87–5.11)
RDW: 13.1 % (ref 11.5–15.5)
WBC: 11.6 10*3/uL — AB (ref 4.0–10.5)

## 2017-01-12 LAB — COMPREHENSIVE METABOLIC PANEL
ALT: 8 U/L — AB (ref 14–54)
ANION GAP: 10 (ref 5–15)
AST: 19 U/L (ref 15–41)
Albumin: 3.4 g/dL — ABNORMAL LOW (ref 3.5–5.0)
Alkaline Phosphatase: 148 U/L — ABNORMAL HIGH (ref 38–126)
BUN: 20 mg/dL (ref 6–20)
CALCIUM: 9.2 mg/dL (ref 8.9–10.3)
CO2: 26 mmol/L (ref 22–32)
Chloride: 97 mmol/L — ABNORMAL LOW (ref 101–111)
Creatinine, Ser: 0.97 mg/dL (ref 0.44–1.00)
GFR calc non Af Amer: 54 mL/min — ABNORMAL LOW (ref >60)
GLUCOSE: 496 mg/dL — AB (ref 65–99)
Potassium: 4.2 mmol/L (ref 3.5–5.1)
Sodium: 133 mmol/L — ABNORMAL LOW (ref 135–145)
TOTAL PROTEIN: 7.4 g/dL (ref 6.5–8.1)
Total Bilirubin: 0.7 mg/dL (ref 0.3–1.2)

## 2017-01-12 LAB — CBG MONITORING, ED
Glucose-Capillary: 354 mg/dL — ABNORMAL HIGH (ref 65–99)
Glucose-Capillary: 484 mg/dL — ABNORMAL HIGH (ref 65–99)

## 2017-01-12 MED ORDER — SODIUM CHLORIDE 0.9 % IV BOLUS (SEPSIS)
1000.0000 mL | Freq: Once | INTRAVENOUS | Status: AC
  Administered 2017-01-12: 1000 mL via INTRAVENOUS

## 2017-01-12 MED ORDER — CEPHALEXIN 500 MG PO CAPS: 500.0000 mg | ORAL_CAPSULE | Freq: Two times a day (BID) | ORAL | 0 refills | Status: DC

## 2017-01-12 NOTE — ED Notes (Signed)
CBG--354 

## 2017-01-12 NOTE — ED Notes (Signed)
CBG 484 

## 2017-01-12 NOTE — ED Notes (Signed)
Notified RN,Joanna pt. I-stat Chem 8 results glucose 504.

## 2017-01-12 NOTE — ED Provider Notes (Signed)
  Face-to-face evaluation   History: Here for evaluation of elevated blood sugar.  She received sliding scale insulin prior to transport, she is a SNF.  Physical exam: Alert elderly female.  No respiratory distress.  Abdomen soft and nontender.  Medical screening examination/treatment/procedure(s) were conducted as a shared visit with non-physician practitioner(s) and myself.  I personally evaluated the patient during the encounter   Mancel Bale, MD 01/21/17 [id number]

## 2017-01-12 NOTE — Discharge Instructions (Signed)
Take Keflex as prescribed until finished. Check your sugar level at least 4-5 times per day to ensure that your glucose level is not getting too high. Follow up with your primary care doctor to ensure resolution of symptoms. Return to the ED for new or concerning symptoms.

## 2017-01-14 LAB — URINE CULTURE: Culture: 60000 — AB

## 2017-01-15 ENCOUNTER — Telehealth: Payer: Self-pay

## 2017-01-15 NOTE — Telephone Encounter (Signed)
Post ED Visit - Positive Culture Follow-up  Culture report reviewed by antimicrobial stewardship pharmacist:   Enzo Bi, Pharm.D.  Celedonio Miyamoto, Pharm.D., BCPS AQ-ID  Garvin Fila, Pharm.D., BCPS  Georgina Pillion, Pharm.D., BCPS  [place], [place].D., BCPS, AAHIVP  Estella Husk, Pharm.D., BCPS, AAHIVP  Lysle Pearl, PharmD, BCPS  Casilda Carls, PharmD, BCPS  Pollyann Samples, PharmD, BCPS  Positive urine culture Treated with Cephalexin, organism sensitive to the same and no further patient follow-up is required at this time.  Jerry Caras 01/15/2017, 10:43 AM

## 2017-02-08 ENCOUNTER — Encounter: Payer: Self-pay | Admitting: Adult Health

## 2017-02-08 ENCOUNTER — Non-Acute Institutional Stay (SKILLED_NURSING_FACILITY): Payer: Medicare Other | Admitting: Adult Health

## 2017-02-08 DIAGNOSIS — L719 Rosacea, unspecified: Secondary | ICD-10-CM | POA: Diagnosis not present

## 2017-02-08 DIAGNOSIS — I10 Essential (primary) hypertension: Secondary | ICD-10-CM

## 2017-02-08 DIAGNOSIS — K5909 Other constipation: Secondary | ICD-10-CM

## 2017-02-08 DIAGNOSIS — F3130 Bipolar disorder, current episode depressed, mild or moderate severity, unspecified: Secondary | ICD-10-CM | POA: Diagnosis not present

## 2017-02-08 DIAGNOSIS — G20A1 Parkinson's disease without dyskinesia, without mention of fluctuations: Secondary | ICD-10-CM

## 2017-02-08 DIAGNOSIS — E1140 Type 2 diabetes mellitus with diabetic neuropathy, unspecified: Secondary | ICD-10-CM

## 2017-02-08 DIAGNOSIS — G20 Parkinson's disease: Secondary | ICD-10-CM

## 2017-02-08 DIAGNOSIS — K219 Gastro-esophageal reflux disease without esophagitis: Secondary | ICD-10-CM | POA: Diagnosis not present

## 2017-02-08 DIAGNOSIS — E785 Hyperlipidemia, unspecified: Secondary | ICD-10-CM | POA: Diagnosis not present

## 2017-02-08 DIAGNOSIS — Z794 Long term (current) use of insulin: Secondary | ICD-10-CM

## 2017-02-08 DIAGNOSIS — N3281 Overactive bladder: Secondary | ICD-10-CM

## 2017-02-08 DIAGNOSIS — G629 Polyneuropathy, unspecified: Secondary | ICD-10-CM | POA: Diagnosis not present

## 2017-02-08 DIAGNOSIS — J309 Allergic rhinitis, unspecified: Secondary | ICD-10-CM | POA: Diagnosis not present

## 2017-02-08 NOTE — Progress Notes (Signed)
DATE:  02/08/2017   MRN:  [id number]  BIRTHDAY: 10/03/1934  Facility:  Nursing Home Location:  [facility]  Nursing Home Room Number: 905-A  LEVEL OF CARE:  SNF [phone])  Contact Information    Name Relation Home Work [place] Daughter [phone]     Katrina Stack Daughter [phone]  [phone]       Code Status History    Date Active Date Inactive Code Status Order ID Comments User Context   10/05/2016 12:17 PM 10/09/2016  6:37 PM Full Code [id number]  Gwenyth Bender, NP ED   08/24/2016  8:19 PM 08/27/2016  7:12 PM Full Code [id number]  Narda Bonds, MD Inpatient       Chief Complaint  Patient presents with  . Medical Management of Chronic Issues    HISTORY OF PRESENT ILLNESS:  This is a 81-YO female seen for a routine visit.  She is a long-term care resident at [facility]. She was recently discharged from Speech therapy services and diet was changed to regular diet. Procel was recently discontinued due to refusals.  She was sent to ER on 01/11/17 for high blood sugar and has completed 7-day course of Keflex for UTI.    PAST MEDICAL HISTORY:  Past Medical History:  Diagnosis Date  . Acute encephalopathy   . Allergic rhinitis   . Ascorbic acid deficiency   . Bipolar affective disorder (HCC)   . Candidiasis   . Charcot's joint   . Chronic constipation   . Dysuria   . GERD (gastroesophageal reflux disease)   . History of falling   . HLD (hyperlipidemia)   . HTN (hypertension)   . Magnesium deficiency   . Major depressive disorder   . Mixed incontinence   . Morbid obesity (HCC)   . Muscle weakness   . Neuropathic pain   . Neuropathy   . OAB (overactive bladder)   . Obesity   . Oral pain   . Parkinson disease (HCC)   . Pneumonia   . Protein calorie malnutrition (HCC)   . Rosacea   . Sepsis (HCC) 09/2016  . Slow transit constipation   . Type 2 diabetes mellitus with diabetic polyneuropathy,  with long-term current use of insulin (HCC)   . Urinary tract infection without hematuria   . Vaginal atrophy   . Vitamin deficiency      CURRENT MEDICATIONS: Reviewed  Patient's Medications  New Prescriptions   No medications on file  Previous Medications   ACETAMINOPHEN (TYLENOL) 325 MG TABLET    Take 650 mg by mouth every 6 (six) hours as needed for mild pain.    ALUM & MAG HYDROXIDE-SIMETH (MAALOX/MYLANTA) 200-200-20 MG/5ML SUSPENSION    Take 30 mLs by mouth every 6 (six) hours as needed for indigestion or heartburn.   AMLODIPINE (NORVASC) 5 MG TABLET    Take 1 tablet (5 mg total) by mouth daily.   ARIPIPRAZOLE (ABILIFY) 2 MG TABLET    Take 2 mg by mouth daily.   ASPIRIN 81 MG EC TABLET    Take 81 mg by mouth daily. Swallow whole.   ATORVASTATIN (LIPITOR) 10 MG TABLET    Take 10 mg by mouth daily.   BISACODYL (DULCOLAX) 10 MG SUPPOSITORY    Place 10 mg rectally daily as needed for moderate constipation.   CARBIDOPA-LEVODOPA (PARCOPA) 25-100 MG DISINTEGRATING TABLET    Take 1 tablet by mouth 2 (two) times daily.    CARBOXYMETHYLCELLULOSE  1 % OPHTHALMIC SOLUTION    Apply 1 drop to eye 3 (three) times daily.    CONJUGATED ESTROGENS (PREMARIN) VAGINAL CREAM    Place 1 Applicatorful vaginally every Monday, Wednesday, and Friday. Apply 0.5mg  (pea-sized amount)  just inside the vaginal introitus with a finger-tip every Monday, Wednesday and Friday nights.   CRANBERRY 200 MG CAPS    Take 2 capsules by mouth daily.   DULOXETINE (CYMBALTA) 60 MG CAPSULE    Take 60 mg by mouth daily.   GABAPENTIN (NEURONTIN) 100 MG CAPSULE    Take 200 mg by mouth at bedtime.    INSULIN GLARGINE (LANTUS) 100 UNIT/ML INJECTION    Inject 40 Units into the skin at bedtime.   INSULIN LISPRO (HUMALOG) 100 UNIT/ML INJECTION    Inject 8-12 Units into the skin See admin instructions. Check CBG BID for CBG of 200-399 give 8 unit; for CBG>400 give 12 units hold for CBG <200-   LAMOTRIGINE (LAMICTAL) 100 MG TABLET    Take  100 mg by mouth 2 (two) times daily.    LINACLOTIDE (LINZESS) 145 MCG CAPS CAPSULE    Take 145 mcg by mouth daily before breakfast.   LISINOPRIL (PRINIVIL,ZESTRIL) 20 MG TABLET    Take 20 mg by mouth daily.    LORATADINE (CLARITIN) 10 MG TABLET    Take 10 mg by mouth daily.   MAGNESIUM 400 MG CAPS    Take 1 capsule by mouth daily.   METOPROLOL TARTRATE (LOPRESSOR) 25 MG TABLET    Take 25 mg by mouth 2 (two) times daily. Take 25 mg by mouth twice a day for HTN   METRONIDAZOLE, TOPICAL, (METROLOTION) 0.75 % LOTN    Apply 1 application topically 2 (two) times daily. Starting 08/12/16. To face, for rosacea.   MULTIVITAMIN-LUTEIN (OCUVITE-LUTEIN) CAPS CAPSULE    Take 1 capsule by mouth daily.   OXYBUTYNIN (DITROPAN) 5 MG TABLET    Take 5 mg by mouth 2 (two) times daily.    PANTOPRAZOLE (PROTONIX) 40 MG TABLET    Take 40 mg by mouth daily.    POLYETHYLENE GLYCOL (MIRALAX / GLYCOLAX) PACKET    Take 17 g by mouth 2 (two) times daily.   SENNOSIDES-DOCUSATE SODIUM (SENOKOT-S) 8.6-50 MG TABLET    Take 2 tablets by mouth 2 (two) times daily.  Modified Medications   No medications on file  Discontinued Medications   CEPHALEXIN (KEFLEX) 500 MG CAPSULE    Take 1 capsule (500 mg total) by mouth 2 (two) times daily.   INSULIN GLARGINE (LANTUS) 100 UNIT/ML INJECTION    Inject 0.35 mLs (35 Units total) into the skin daily.   PROTEIN (PROCEL 100) POWD    Take 1 scoop by mouth 2 (two) times daily.     Allergies  Allergen Reactions  . Codeine Itching  . Morphine And Related Itching  . Indocin [Indomethacin] Rash     REVIEW OF SYSTEMS:  GENERAL: no change in appetite, no fatigue, no weight changes, no fever, chills or weakness EYES: Denies change in vision, dry eyes, eye pain, itching or discharge EARS: Denies change in hearing, ringing in ears, or earache NOSE: Denies nasal congestion or epistaxis MOUTH and THROAT: Denies oral discomfort, gingival pain or bleeding, pain from teeth or hoarseness     RESPIRATORY: no cough, SOB, DOE, wheezing, hemoptysis CARDIAC: no chest pain, edema or palpitations GI: no abdominal pain, diarrhea, constipation, heart burn, nausea or vomiting GU: Denies dysuria, frequency, hematuria, incontinence, or discharge PSYCHIATRIC: Denies feeling of  depression or anxiety. No report of hallucinations, insomnia, paranoia, or agitation   PHYSICAL EXAMINATION  GENERAL APPEARANCE: Well nourished. In no acute distress. Obese SKIN:  Skin is warm and dry.  HEAD: Normal in size and contour. No evidence of trauma EYES: Lids open and close normally. No blepharitis, entropion or ectropion. PERRL. Conjunctivae are clear and sclerae are white. Lenses are without opacity EARS: Pinnae are normal. Patient hears normal voice tunes of the examiner MOUTH and THROAT: Lips are without lesions. Oral mucosa is moist and without lesions. Tongue is normal in shape, size, and color and without lesions NECK: supple, trachea midline, no neck masses, no thyroid tenderness, no thyromegaly LYMPHATICS: no LAN in the neck, no supraclavicular LAN RESPIRATORY: breathing is even & unlabored, BS CTAB CARDIAC: RRR, no murmur,no extra heart sounds, no edema GI: abdomen soft, normal BS, no masses, no tenderness, no hepatomegaly, no splenomegaly EXTREMITIES:  Able to move X 4 extremities PSYCHIATRIC: Alert and oriented X 3. Affect and behavior are appropriate   LABS/RADIOLOGY: Labs reviewed: Basic Metabolic Panel:  Recent Labs  16/06/9600/12/18 0700 10/08/16 0643  01/10/17 01/11/17 2338 01/11/17 2346  NA 141 139  < > 137 133* 134*  K 3.4* 4.2  < > 4.5 4.2 4.3  CL 110 108  --   --  97* 98*  CO2 23 23  --   --  26  --   GLUCOSE 132* 154*  --   --  496* 504*  BUN 14 11  < > 19 20 21*  CREATININE 0.92 0.77  < > 0.9 0.97 0.90  CALCIUM 8.3* 9.0  --   --  9.2  --   MG 2.0  --   --   --   --   --   < > = values in this interval not displayed. Liver Function Tests:  Recent Labs  10/05/16 0931  10/08/16 0643 01/11/17 2338  AST 21 20 19   ALT 9* 6* 8*  ALKPHOS 115 100 148*  BILITOT 1.6* 0.4 0.7  PROT 7.6 6.9 7.4  ALBUMIN 3.1* 2.6* 3.4*   CBC:  Recent Labs  10/07/16 0700 10/08/16 0643 10/14/16 10/17/16 12/14/16 01/11/17 2338 01/11/17 2346  WBC 15.0* 12.2* 12.0 10.4 10.3 11.6*  --   NEUTROABS  --   --  8 7  --  7.8*  --   HGB 11.7* 13.1 12.5 11.5* 14.4 13.6 14.3  HCT 35.4* 39.7 38 35* 43 40.2 42.0  MCV 93.4 91.9  --   --   --  87.4  --   PLT 232 285 326 293 308 279  --    Lipid Panel:  Recent Labs  07/04/16  HDL 35   Cardiac Enzymes:  Recent Labs  08/24/16 1305  TROPONINI <0.03   CBG:  Recent Labs  01/11/17 2156 01/12/17 0027 01/12/17 0217  GLUCAP 529* 484* 354*      Dg Chest 2 View  Result Date: 01/11/2017 CLINICAL DATA:  Hyperglycemia. History of hypertension, Parkinson disease, pneumonia, diabetes. EXAM: CHEST  2 VIEW COMPARISON:  10/05/2016 FINDINGS: Mild cardiac enlargement with normal pulmonary vascularity. No focal airspace disease or consolidation in the lungs. No blunting of costophrenic angles. No pneumothorax. Mediastinal contours appear intact. Calcification of aorta. Degenerative changes in the spine and shoulders. No significant change since prior study. IMPRESSION: No evidence of active pulmonary disease.  Mild cardiac enlargement. Electronically Signed   By: Burman NievesWilliam  Stevens M.D.   On: 01/11/2017 23:44    ASSESSMENT/PLAN:  Rosacea - Continue metronidazole 0.75% lotion applied topically to face twice a day  Parkinson's disease - continue Sinemet 25-100 mg ODT 1 tab by mouth twice a day  Chronic constipation - continue senna S 2 tabs by mouth twice a day, Dulcolax 10 mg suppository 1 rectally daily when necessary, Linzess 145 g 1 capsule by mouth daily and MiraLAX 17 g by mouth twice a day  Bipolar disorder - mood is stable; continue Lamictal 100 mg 1 tab by mouth twice a day, Abilify 2 mg 1 tab by mouth daily, Cymbalta 60 mg 1  capsule by mouth daily  Overactive bladder - stable; continue oxybutynin 5 mg 1 tab by mouth twice a day  Essential hypertension - well-controlled; continue metoprolol tartrate 25 mg 1 tab by mouth twice a day, amlodipine 5 mg 1 tab by mouth daily and lisinopril 20 mg 1 tab by mouth daily  Diabetes mellitus, type II - recently increased Lantus 100 units/mL to 40 units subcutaneous daily and Humalog 100 units at/ML sliding scale subcutaneous twice a day, CBG twice a day Lab Results  Component Value Date   HGBA1C 13.3 01/10/2017   Allergic rhinitis - continue loratadine 10 mg 1 tab by mouth daily  Hypomagnesemia - continue magnesium 400 mg 1 tab by mouth daily  GERD - continue pantoprazole 40 mg 1 tab by mouth daily  Neuropathy - continue gabapentin 100 mg 2 capsules = 200 mg by mouth daily at bedtime and Tylenol 325 mg give 2 tabs =g by mou  Hyperlipidemia - continue atorvastatin 10 mg 1 tab by mouth daily Lab Results  Component Value Date   CHOL 142 07/04/2016   HDL 35 07/04/2016   LDLCALC 72 07/04/2016   TRIG 176 (A) 07/04/2016      Goals of care:  Long-term care   Emmry Hinsch C. Medina-Vargas - NP    [employer] [phone]

## 2017-03-08 DIAGNOSIS — F3130 Bipolar disorder, current episode depressed, mild or moderate severity, unspecified: Secondary | ICD-10-CM | POA: Diagnosis not present

## 2017-03-08 DIAGNOSIS — F0280 Dementia in other diseases classified elsewhere without behavioral disturbance: Secondary | ICD-10-CM | POA: Diagnosis not present

## 2017-03-08 DIAGNOSIS — G20 Parkinson's disease: Secondary | ICD-10-CM | POA: Diagnosis not present

## 2017-03-08 DIAGNOSIS — F329 Major depressive disorder, single episode, unspecified: Secondary | ICD-10-CM | POA: Diagnosis not present

## 2017-06-17 ENCOUNTER — Encounter: Payer: Self-pay | Admitting: Emergency Medicine

## 2017-06-17 ENCOUNTER — Emergency Department: Payer: Medicare Other

## 2017-06-17 ENCOUNTER — Inpatient Hospital Stay
Admission: EM | Admit: 2017-06-17 | Discharge: 2017-06-24 | DRG: 871 | Disposition: A | Discharge status: Skilled Nursing Facility | Payer: Medicare Other | Attending: Internal Medicine | Admitting: Internal Medicine

## 2017-06-17 DIAGNOSIS — Z87440 Personal history of urinary (tract) infections: Secondary | ICD-10-CM

## 2017-06-17 DIAGNOSIS — L03116 Cellulitis of left lower limb: Secondary | ICD-10-CM | POA: Diagnosis present

## 2017-06-17 DIAGNOSIS — N3281 Overactive bladder: Secondary | ICD-10-CM | POA: Diagnosis present

## 2017-06-17 DIAGNOSIS — A419 Sepsis, unspecified organism: Secondary | ICD-10-CM

## 2017-06-17 DIAGNOSIS — Z794 Long term (current) use of insulin: Secondary | ICD-10-CM | POA: Diagnosis not present

## 2017-06-17 DIAGNOSIS — Z96651 Presence of right artificial knee joint: Secondary | ICD-10-CM | POA: Diagnosis present

## 2017-06-17 DIAGNOSIS — N390 Urinary tract infection, site not specified: Secondary | ICD-10-CM | POA: Diagnosis not present

## 2017-06-17 DIAGNOSIS — I10 Essential (primary) hypertension: Secondary | ICD-10-CM | POA: Diagnosis present

## 2017-06-17 DIAGNOSIS — L8990 Pressure ulcer of unspecified site, unspecified stage: Secondary | ICD-10-CM | POA: Insufficient documentation

## 2017-06-17 DIAGNOSIS — L270 Generalized skin eruption due to drugs and medicaments taken internally: Secondary | ICD-10-CM | POA: Diagnosis present

## 2017-06-17 DIAGNOSIS — L0390 Cellulitis, unspecified: Secondary | ICD-10-CM | POA: Diagnosis not present

## 2017-06-17 DIAGNOSIS — Z23 Encounter for immunization: Secondary | ICD-10-CM | POA: Diagnosis present

## 2017-06-17 DIAGNOSIS — Z90710 Acquired absence of both cervix and uterus: Secondary | ICD-10-CM | POA: Diagnosis not present

## 2017-06-17 DIAGNOSIS — T3695XA Adverse effect of unspecified systemic antibiotic, initial encounter: Secondary | ICD-10-CM | POA: Diagnosis present

## 2017-06-17 DIAGNOSIS — R32 Unspecified urinary incontinence: Secondary | ICD-10-CM | POA: Diagnosis present

## 2017-06-17 DIAGNOSIS — G9341 Metabolic encephalopathy: Secondary | ICD-10-CM | POA: Diagnosis present

## 2017-06-17 DIAGNOSIS — R0902 Hypoxemia: Secondary | ICD-10-CM

## 2017-06-17 DIAGNOSIS — E785 Hyperlipidemia, unspecified: Secondary | ICD-10-CM | POA: Diagnosis present

## 2017-06-17 DIAGNOSIS — K219 Gastro-esophageal reflux disease without esophagitis: Secondary | ICD-10-CM | POA: Diagnosis present

## 2017-06-17 DIAGNOSIS — L03115 Cellulitis of right lower limb: Secondary | ICD-10-CM | POA: Diagnosis present

## 2017-06-17 DIAGNOSIS — Z6841 Body Mass Index (BMI) 40.0 and over, adult: Secondary | ICD-10-CM | POA: Diagnosis not present

## 2017-06-17 DIAGNOSIS — B9562 Methicillin resistant Staphylococcus aureus infection as the cause of diseases classified elsewhere: Secondary | ICD-10-CM | POA: Diagnosis present

## 2017-06-17 DIAGNOSIS — R6510 Systemic inflammatory response syndrome (SIRS) of non-infectious origin without acute organ dysfunction: Secondary | ICD-10-CM

## 2017-06-17 DIAGNOSIS — G20 Parkinson's disease: Secondary | ICD-10-CM | POA: Diagnosis not present

## 2017-06-17 DIAGNOSIS — L989 Disorder of the skin and subcutaneous tissue, unspecified: Secondary | ICD-10-CM | POA: Diagnosis not present

## 2017-06-17 DIAGNOSIS — G9340 Encephalopathy, unspecified: Secondary | ICD-10-CM | POA: Diagnosis not present

## 2017-06-17 DIAGNOSIS — E1142 Type 2 diabetes mellitus with diabetic polyneuropathy: Secondary | ICD-10-CM | POA: Diagnosis present

## 2017-06-17 DIAGNOSIS — R062 Wheezing: Secondary | ICD-10-CM | POA: Diagnosis present

## 2017-06-17 DIAGNOSIS — R0602 Shortness of breath: Secondary | ICD-10-CM

## 2017-06-17 DIAGNOSIS — R3914 Feeling of incomplete bladder emptying: Secondary | ICD-10-CM | POA: Diagnosis not present

## 2017-06-17 DIAGNOSIS — F319 Bipolar disorder, unspecified: Secondary | ICD-10-CM | POA: Diagnosis present

## 2017-06-17 DIAGNOSIS — Z7982 Long term (current) use of aspirin: Secondary | ICD-10-CM

## 2017-06-17 DIAGNOSIS — F0390 Unspecified dementia without behavioral disturbance: Secondary | ICD-10-CM | POA: Diagnosis present

## 2017-06-17 DIAGNOSIS — R6520 Severe sepsis without septic shock: Secondary | ICD-10-CM | POA: Diagnosis present

## 2017-06-17 DIAGNOSIS — R4182 Altered mental status, unspecified: Secondary | ICD-10-CM | POA: Diagnosis present

## 2017-06-17 DIAGNOSIS — L89612 Pressure ulcer of right heel, stage 2: Secondary | ICD-10-CM | POA: Diagnosis present

## 2017-06-17 DIAGNOSIS — K5900 Constipation, unspecified: Secondary | ICD-10-CM | POA: Diagnosis present

## 2017-06-17 DIAGNOSIS — E872 Acidosis: Secondary | ICD-10-CM | POA: Diagnosis not present

## 2017-06-17 DIAGNOSIS — R109 Unspecified abdominal pain: Secondary | ICD-10-CM

## 2017-06-17 LAB — GLUCOSE, CAPILLARY
GLUCOSE-CAPILLARY: 55 mg/dL — AB (ref 65–99)
Glucose-Capillary: 84 mg/dL (ref 65–99)
Glucose-Capillary: 53 mg/dL — ABNORMAL LOW (ref 65–99)
Glucose-Capillary: 76 mg/dL (ref 65–99)
Glucose-Capillary: 88 mg/dL (ref 65–99)

## 2017-06-17 LAB — CBC WITH DIFFERENTIAL/PLATELET
BASOS PCT: 1 %
Basophils Absolute: 0.1 10*3/uL (ref 0–0.1)
Eosinophils Absolute: 0.9 10*3/uL — ABNORMAL HIGH (ref 0–0.7)
Eosinophils Relative: 7 %
HCT: 33.8 % — ABNORMAL LOW (ref 35.0–47.0)
Hemoglobin: 11.1 g/dL — ABNORMAL LOW (ref 12.0–16.0)
Lymphocytes Relative: 15 %
Lymphs Abs: 1.9 10*3/uL (ref 1.0–3.6)
MCH: 29.5 pg (ref 26.0–34.0)
MCHC: 32.9 g/dL (ref 32.0–36.0)
MCV: 89.6 fL (ref 80.0–100.0)
MONOS PCT: 8 %
Monocytes Absolute: 1 10*3/uL — ABNORMAL HIGH (ref 0.2–0.9)
Neutro Abs: 8.8 10*3/uL — ABNORMAL HIGH (ref 1.4–6.5)
Neutrophils Relative %: 69 %
PLATELETS: 345 10*3/uL (ref 150–440)
RBC: 3.77 MIL/uL — ABNORMAL LOW (ref 3.80–5.20)
RDW: 14.2 % (ref 11.5–14.5)
WBC: 12.8 10*3/uL — ABNORMAL HIGH (ref 3.6–11.0)

## 2017-06-17 LAB — URINE DRUG SCREEN, QUALITATIVE (ARMC ONLY)
Amphetamines, Ur Screen: NOT DETECTED
BARBITURATES, UR SCREEN: NOT DETECTED
BENZODIAZEPINE, UR SCRN: NOT DETECTED
Cannabinoid 50 Ng, Ur ~~LOC~~: NOT DETECTED
Cocaine Metabolite,Ur ~~LOC~~: NOT DETECTED
MDMA (Ecstasy)Ur Screen: NOT DETECTED
Methadone Scn, Ur: NOT DETECTED
OPIATE, UR SCREEN: NOT DETECTED
PHENCYCLIDINE (PCP) UR S: NOT DETECTED
Tricyclic, Ur Screen: NOT DETECTED

## 2017-06-17 LAB — COMPREHENSIVE METABOLIC PANEL
ALBUMIN: 2.9 g/dL — AB (ref 3.5–5.0)
ALT: 6 U/L — ABNORMAL LOW (ref 14–54)
ANION GAP: 10 (ref 5–15)
AST: 35 U/L (ref 15–41)
Alkaline Phosphatase: 114 U/L (ref 38–126)
BUN: 24 mg/dL — ABNORMAL HIGH (ref 6–20)
CHLORIDE: 108 mmol/L (ref 101–111)
CO2: 24 mmol/L (ref 22–32)
Calcium: 8.3 mg/dL — ABNORMAL LOW (ref 8.9–10.3)
Creatinine, Ser: 1.44 mg/dL — ABNORMAL HIGH (ref 0.44–1.00)
GFR calc Af Amer: 39 mL/min — ABNORMAL LOW (ref >60)
GFR calc non Af Amer: 34 mL/min — ABNORMAL LOW (ref >60)
GLUCOSE: 91 mg/dL (ref 65–99)
POTASSIUM: 4.6 mmol/L (ref 3.5–5.1)
Sodium: 142 mmol/L (ref 135–145)
TOTAL PROTEIN: 6.9 g/dL (ref 6.5–8.1)
Total Bilirubin: 0.7 mg/dL (ref 0.3–1.2)

## 2017-06-17 LAB — LACTIC ACID, PLASMA
LACTIC ACID, VENOUS: 1.4 mmol/L (ref 0.5–1.9)
Lactic Acid, Venous: 2.1 mmol/L (ref 0.5–1.9)

## 2017-06-17 LAB — URINALYSIS, COMPLETE (UACMP) WITH MICROSCOPIC
BILIRUBIN URINE: NEGATIVE
GLUCOSE, UA: NEGATIVE mg/dL
HGB URINE DIPSTICK: NEGATIVE
KETONES UR: NEGATIVE mg/dL
NITRITE: NEGATIVE
Protein, ur: 100 mg/dL — AB
Specific Gravity, Urine: 1.016 (ref 1.005–1.030)
pH: 5 (ref 5.0–8.0)

## 2017-06-17 LAB — ETHANOL: Alcohol, Ethyl (B): <5 mg/dL (ref <5)

## 2017-06-17 LAB — TSH: TSH: 2.88 u[IU]/mL (ref 0.350–4.500)

## 2017-06-17 LAB — MRSA PCR SCREENING: MRSA by PCR: POSITIVE — AB

## 2017-06-17 MED ORDER — HYDROXYZINE HCL 25 MG PO TABS
25.0000 mg | ORAL_TABLET | Freq: Three times a day (TID) | ORAL | Status: DC
  Filled 2017-06-17 (×2): qty 1

## 2017-06-17 MED ORDER — OXYBUTYNIN CHLORIDE 5 MG PO TABS
5.0000 mg | ORAL_TABLET | Freq: Two times a day (BID) | ORAL | Status: DC
  Administered 2017-06-17 – 2017-06-24 (×11): 5 mg via ORAL
  Filled 2017-06-17 (×12): qty 1

## 2017-06-17 MED ORDER — ONDANSETRON HCL 4 MG PO TABS: 4.0000 mg | ORAL_TABLET | Freq: Four times a day (QID) | ORAL | Status: DC | PRN

## 2017-06-17 MED ORDER — ATORVASTATIN CALCIUM 20 MG PO TABS
20.0000 mg | ORAL_TABLET | Freq: Every day | ORAL | Status: DC
  Administered 2017-06-18 – 2017-06-24 (×6): 20 mg via ORAL
  Filled 2017-06-17 (×7): qty 1

## 2017-06-17 MED ORDER — DEXTROSE 50 % IV SOLN
INTRAVENOUS | Status: AC
  Administered 2017-06-17: 25 mL via INTRAVENOUS
  Filled 2017-06-17: qty 50

## 2017-06-17 MED ORDER — METRONIDAZOLE 0.75 % EX LOTN: 1.0000 "application " | TOPICAL_LOTION | CUTANEOUS | Status: DC

## 2017-06-17 MED ORDER — NYSTATIN 100000 UNIT/GM EX POWD
Freq: Three times a day (TID) | CUTANEOUS | Status: DC
  Administered 2017-06-17 – 2017-06-18 (×3): via TOPICAL
  Filled 2017-06-17: qty 15

## 2017-06-17 MED ORDER — GABAPENTIN 100 MG PO CAPS
200.0000 mg | ORAL_CAPSULE | Freq: Every day | ORAL | Status: DC
  Administered 2017-06-17 – 2017-06-23 (×5): 200 mg via ORAL
  Filled 2017-06-17 (×5): qty 2

## 2017-06-17 MED ORDER — METOPROLOL TARTRATE 25 MG PO TABS
25.0000 mg | ORAL_TABLET | Freq: Two times a day (BID) | ORAL | Status: DC
  Administered 2017-06-18 – 2017-06-20 (×5): 25 mg via ORAL
  Filled 2017-06-17 (×6): qty 1

## 2017-06-17 MED ORDER — CARBIDOPA-LEVODOPA 25-100 MG PO TABS
1.0000 | ORAL_TABLET | Freq: Two times a day (BID) | ORAL | Status: DC
  Administered 2017-06-17 – 2017-06-24 (×11): 1 via ORAL
  Filled 2017-06-17 (×15): qty 1

## 2017-06-17 MED ORDER — ACETAMINOPHEN 650 MG RE SUPP: 650.0000 mg | Freq: Four times a day (QID) | RECTAL | Status: DC | PRN

## 2017-06-17 MED ORDER — INFLUENZA VAC SPLIT HIGH-DOSE 0.5 ML IM SUSY
0.5000 mL | PREFILLED_SYRINGE | INTRAMUSCULAR | Status: AC
  Administered 2017-06-18: 0.5 mL via INTRAMUSCULAR
  Filled 2017-06-17: qty 0.5

## 2017-06-17 MED ORDER — SODIUM CHLORIDE 0.9 % IV BOLUS (SEPSIS)
1000.0000 mL | Freq: Once | INTRAVENOUS | Status: AC
  Administered 2017-06-17: 1000 mL via INTRAVENOUS

## 2017-06-17 MED ORDER — OCUVITE-LUTEIN PO TABS
1.0000 | ORAL_TABLET | Freq: Every day | ORAL | Status: DC
  Administered 2017-06-18 – 2017-06-19 (×2): 1 via ORAL
  Filled 2017-06-17 (×5): qty 1

## 2017-06-17 MED ORDER — ACETAMINOPHEN 325 MG PO TABS: 650.0000 mg | ORAL_TABLET | Freq: Four times a day (QID) | ORAL | Status: DC | PRN

## 2017-06-17 MED ORDER — SODIUM CHLORIDE 0.9 % IV SOLN
INTRAVENOUS | Status: DC
  Administered 2017-06-17 – 2017-06-20 (×5): via INTRAVENOUS

## 2017-06-17 MED ORDER — IPRATROPIUM-ALBUTEROL 0.5-2.5 (3) MG/3ML IN SOLN
3.0000 mL | RESPIRATORY_TRACT | Status: DC | PRN
  Administered 2017-06-20 – 2017-06-22 (×5): 3 mL via RESPIRATORY_TRACT
  Filled 2017-06-17 (×6): qty 3

## 2017-06-17 MED ORDER — DULOXETINE HCL 30 MG PO CPEP
60.0000 mg | ORAL_CAPSULE | Freq: Every day | ORAL | Status: DC
  Administered 2017-06-18 – 2017-06-24 (×6): 60 mg via ORAL
  Filled 2017-06-17 (×8): qty 2

## 2017-06-17 MED ORDER — ENOXAPARIN SODIUM 40 MG/0.4ML ~~LOC~~ SOLN
40.0000 mg | SUBCUTANEOUS | Status: DC
  Administered 2017-06-17 – 2017-06-23 (×4): 40 mg via SUBCUTANEOUS
  Filled 2017-06-17 (×6): qty 0.4

## 2017-06-17 MED ORDER — LINACLOTIDE 145 MCG PO CAPS
145.0000 ug | ORAL_CAPSULE | Freq: Every day | ORAL | Status: DC
  Administered 2017-06-18 – 2017-06-24 (×6): 145 ug via ORAL
  Filled 2017-06-17 (×8): qty 1

## 2017-06-17 MED ORDER — ONDANSETRON HCL 4 MG/2ML IJ SOLN: 4.0000 mg | Freq: Four times a day (QID) | INTRAMUSCULAR | Status: DC | PRN

## 2017-06-17 MED ORDER — METRONIDAZOLE 0.75 % EX CREA
TOPICAL_CREAM | CUTANEOUS | Status: DC
  Administered 2017-06-19 – 2017-06-24 (×4): via TOPICAL
  Filled 2017-06-17: qty 45

## 2017-06-17 MED ORDER — DEXTROSE 50 % IV SOLN
25.0000 mL | INTRAVENOUS | Status: AC
  Administered 2017-06-17: 25 mL via INTRAVENOUS

## 2017-06-17 MED ORDER — ORAL CARE MOUTH RINSE
15.0000 mL | Freq: Two times a day (BID) | OROMUCOSAL | Status: DC
Start: 2017-06-17 — End: 2017-06-24
  Administered 2017-06-17 – 2017-06-24 (×10): 15 mL via OROMUCOSAL

## 2017-06-17 MED ORDER — LAMOTRIGINE 100 MG PO TABS
100.0000 mg | ORAL_TABLET | Freq: Two times a day (BID) | ORAL | Status: DC
  Administered 2017-06-17 – 2017-06-24 (×11): 100 mg via ORAL
  Filled 2017-06-17 (×12): qty 1

## 2017-06-17 MED ORDER — INSULIN ASPART 100 UNIT/ML ~~LOC~~ SOLN
0.0000 [IU] | Freq: Three times a day (TID) | SUBCUTANEOUS | Status: DC
Start: 2017-06-17 — End: 2017-06-24
  Administered 2017-06-18 – 2017-06-19 (×3): 2 [IU] via SUBCUTANEOUS
  Administered 2017-06-19: 5 [IU] via SUBCUTANEOUS
  Administered 2017-06-19 – 2017-06-20 (×4): 2 [IU] via SUBCUTANEOUS
  Administered 2017-06-21 (×3): 1 [IU] via SUBCUTANEOUS
  Administered 2017-06-22: 5 [IU] via SUBCUTANEOUS
  Administered 2017-06-22: 7 [IU] via SUBCUTANEOUS
  Administered 2017-06-22: 2 [IU] via SUBCUTANEOUS
  Administered 2017-06-23 (×2): 9 [IU] via SUBCUTANEOUS
  Administered 2017-06-23: 5 [IU] via SUBCUTANEOUS
  Administered 2017-06-24 (×2): 9 [IU] via SUBCUTANEOUS
  Filled 2017-06-17 (×19): qty 1

## 2017-06-17 MED ORDER — INSULIN GLARGINE 100 UNIT/ML ~~LOC~~ SOLN
42.0000 [IU] | Freq: Two times a day (BID) | SUBCUTANEOUS | Status: DC
  Filled 2017-06-17 (×3): qty 0.42

## 2017-06-17 MED ORDER — CEFTRIAXONE SODIUM-DEXTROSE 1-3.74 GM-% IV SOLR
1.0000 g | INTRAVENOUS | Status: DC
  Administered 2017-06-18: 1 g via INTRAVENOUS
  Filled 2017-06-17 (×2): qty 50

## 2017-06-17 MED ORDER — DOCUSATE SODIUM 100 MG PO CAPS
100.0000 mg | ORAL_CAPSULE | Freq: Two times a day (BID) | ORAL | Status: DC
  Administered 2017-06-17 – 2017-06-24 (×11): 100 mg via ORAL
  Filled 2017-06-17 (×12): qty 1

## 2017-06-17 MED ORDER — POLYVINYL ALCOHOL 1.4 % OP SOLN
1.0000 [drp] | Freq: Three times a day (TID) | OPHTHALMIC | Status: DC | PRN
  Filled 2017-06-17: qty 15

## 2017-06-17 MED ORDER — MUPIROCIN 2 % EX OINT
1.0000 "application " | TOPICAL_OINTMENT | Freq: Two times a day (BID) | CUTANEOUS | Status: AC
  Administered 2017-06-17 – 2017-06-22 (×8): 1 via NASAL
  Filled 2017-06-17: qty 22

## 2017-06-17 MED ORDER — IPRATROPIUM-ALBUTEROL 0.5-2.5 (3) MG/3ML IN SOLN: 3.0000 mL | Freq: Four times a day (QID) | RESPIRATORY_TRACT | Status: DC

## 2017-06-17 MED ORDER — CEFTRIAXONE SODIUM IN DEXTROSE 20 MG/ML IV SOLN
1.0000 g | Freq: Once | INTRAVENOUS | Status: AC
  Administered 2017-06-17: 1 g via INTRAVENOUS
  Filled 2017-06-17: qty 50

## 2017-06-17 MED ORDER — BISACODYL 10 MG RE SUPP: 10.0000 mg | Freq: Every day | RECTAL | Status: DC | PRN

## 2017-06-17 MED ORDER — CHLORHEXIDINE GLUCONATE CLOTH 2 % EX PADS
6.0000 | MEDICATED_PAD | Freq: Every day | CUTANEOUS | Status: AC
  Administered 2017-06-20 – 2017-06-22 (×3): 6 via TOPICAL

## 2017-06-17 MED ORDER — CEFTRIAXONE SODIUM IN DEXTROSE 20 MG/ML IV SOLN: 1.0000 g | INTRAVENOUS | Status: DC

## 2017-06-17 MED ORDER — CEFTRIAXONE SODIUM IN DEXTROSE 20 MG/ML IV SOLN
1.0000 g | INTRAVENOUS | Status: DC
  Filled 2017-06-17: qty 50

## 2017-06-17 MED ORDER — ASPIRIN EC 81 MG PO TBEC
81.0000 mg | DELAYED_RELEASE_TABLET | Freq: Every day | ORAL | Status: DC
  Administered 2017-06-18 – 2017-06-24 (×6): 81 mg via ORAL
  Filled 2017-06-17 (×7): qty 1

## 2017-06-17 MED ORDER — LISINOPRIL 20 MG PO TABS
20.0000 mg | ORAL_TABLET | Freq: Every day | ORAL | Status: DC
  Administered 2017-06-18 – 2017-06-24 (×6): 20 mg via ORAL
  Filled 2017-06-17 (×7): qty 1

## 2017-06-17 MED ORDER — PANTOPRAZOLE SODIUM 40 MG IV SOLR
40.0000 mg | Freq: Two times a day (BID) | INTRAVENOUS | Status: DC
  Administered 2017-06-17 – 2017-06-18 (×2): 40 mg via INTRAVENOUS
  Filled 2017-06-17 (×2): qty 40

## 2017-06-17 MED ORDER — ARIPIPRAZOLE 5 MG PO TABS
2.5000 mg | ORAL_TABLET | Freq: Two times a day (BID) | ORAL | Status: DC
  Administered 2017-06-17 – 2017-06-24 (×11): 2.5 mg via ORAL
  Filled 2017-06-17 (×16): qty 1

## 2017-06-17 NOTE — Clinical Social Work Note (Addendum)
CSW received a consult for possible placement in a SNF. CSW will follow pending PT recommendations.  CSW received second consult that patient is a resident of [facility]. CSW confirmed such with the patient's daughter, [name]. CSW will contact [facility] and complete assessment when able.  Argentina Ponder, MSW, [facility] [phone]

## 2017-06-17 NOTE — ED Triage Notes (Signed)
Patient has had UTI for unknown time.This am noted decreased level of consciousness which per daughter is patients usual septic presentation. Also patient has had all over rash per daughter x 1 month. Patient weak with minimal verbalization in triage. Is noted to move all extremities.

## 2017-06-17 NOTE — Progress Notes (Signed)
Called the on call MD to let them know about needing something for moisture/fungal infection.  Dr. Amado Coe gave me a verbal order for nystatin powder.

## 2017-06-17 NOTE — NC FL2 (Signed)
[place] MEDICAID FL2 LEVEL OF CARE SCREENING TOOL     IDENTIFICATION  Patient Name: Maureen Taylor Birthdate: 1935/03/17 Sex: female Admission Date (Current Location): 06/17/2017  [place] and [place] Number:  Chiropodist and Address:  [facility], [address] [place], [place] [address]      Provider Number: [id number]  Attending Physician Name and Address:  Marguarite Arbour, MD  Relative Name and Phone Number:  Wallene Huh [phone]) Daughter    Current Level of Care: Hospital Recommended Level of Care: Skilled Nursing Facility Prior Approval Number:    Date Approved/Denied: 01/20/03 PASRR Number: [id number]  Discharge Plan: SNF    Current Diagnoses: Patient Active Problem List   Diagnosis Date Noted  . SIRS (systemic inflammatory response syndrome) (HCC) 06/17/2017  . Sepsis (HCC) 10/05/2016  . Acute encephalopathy 10/05/2016  . Diabetes mellitus with complication (HCC)   . Complicated UTI (urinary tract infection)   . Acute cystitis without hematuria   . Morbid obesity (HCC)   . UTI (urinary tract infection) 08/24/2016  . Altered mental status 08/24/2016  . Constipation 08/24/2016  . Hyperlipidemia 06/05/2015  . Type 2 diabetes mellitus with diabetic polyneuropathy (HCC) 06/05/2015  . Vitamin D deficiency 06/05/2015  . Recurrent UTI 04/17/2015  . Vaginal atrophy 04/17/2015  . Incontinence 04/17/2015  . OAB (overactive bladder) 03/03/2015  . Bipolar depression (HCC) 01/05/2015  . GERD (gastroesophageal reflux disease) 01/05/2015  . Allergic rhinitis 01/05/2015  . Essential hypertension 01/05/2015  . Candida infection 01/05/2015  . Parkinson's disease (HCC) 01/05/2015  . Neuropathy 01/05/2015  . Diabetes mellitus with neuropathy (HCC) 01/05/2015    Orientation RESPIRATION BLADDER Height & Weight     Self, Situation, Time, Place  Normal Incontinent Weight: 234 lb 11.2 oz (106.5 kg) Height:   (165.1  cm)  BEHAVIORAL SYMPTOMS/MOOD NEUROLOGICAL BOWEL NUTRITION STATUS      Continent Diet (Heart healthy)  AMBULATORY STATUS COMMUNICATION OF NEEDS Skin   Extensive Assist Verbally Normal                       Personal Care Assistance Level of Assistance  Bathing, Feeding, Dressing Bathing Assistance: Maximum assistance Feeding assistance: Independent Dressing Assistance: Maximum assistance     Functional Limitations Info             SPECIAL CARE FACTORS FREQUENCY                       Contractures Contractures Info: Not present    Additional Factors Info  Code Status, Allergies Code Status Info: Full Allergies Info: Codeine, Morphine And Related, Indocin Indomethacin           Current Medications (06/17/2017):  This is the current hospital active medication list Current Facility-Administered Medications  Medication Dose Route Frequency Provider Last Rate Last Dose  . 0.9 %  sodium chloride infusion   Intravenous Continuous Marguarite Arbour, MD      . acetaminophen (TYLENOL) tablet 650 mg  650 mg Oral Q6H PRN Marguarite Arbour, MD       Or  . acetaminophen (TYLENOL) suppository 650 mg  650 mg Rectal Q6H PRN Marguarite Arbour, MD      . ARIPiprazole (ABILIFY) tablet 2.5 mg  2.5 mg Oral BID Marguarite Arbour, MD      . aspirin EC tablet 81 mg  81 mg Oral Daily Marguarite Arbour, MD      .  atorvastatin (LIPITOR) tablet 20 mg  20 mg Oral Daily Marguarite Arbour, MD      . beta carotene w/minerals (OCUVITE) tablet 1 tablet  1 tablet Oral Daily Marguarite Arbour, MD      . bisacodyl (DULCOLAX) suppository 10 mg  10 mg Rectal Daily PRN Marguarite Arbour, MD      . carbidopa-levodopa (SINEMET IR) 25-100 MG per tablet immediate release 1 tablet  1 tablet Oral BID Marguarite Arbour, MD      . Melene Muller ON 06/18/2017] cefTRIAXone (ROCEPHIN) IVPB 1 g  1 g Intravenous Q24H Marguarite Arbour, MD      . docusate sodium (COLACE) capsule 100 mg  100 mg Oral BID Marguarite Arbour,  MD      . DULoxetine (CYMBALTA) DR capsule 60 mg  60 mg Oral Daily Marguarite Arbour, MD      . enoxaparin (LOVENOX) injection 40 mg  40 mg Subcutaneous Q24H Marguarite Arbour, MD      . gabapentin (NEURONTIN) capsule 200 mg  200 mg Oral QHS Marguarite Arbour, MD      . hydrOXYzine (ATARAX/VISTARIL) tablet 25 mg  25 mg Oral TID Marguarite Arbour, MD      . Melene Muller ON 06/18/2017] Influenza vac split quadrivalent PF (FLUZONE HIGH-DOSE) injection 0.5 mL  0.5 mL Intramuscular Tomorrow-1000 Marguarite Arbour, MD      . insulin aspart (novoLOG) injection 0-9 Units  0-9 Units Subcutaneous TID WC Marguarite Arbour, MD      . insulin glargine (LANTUS) injection 42 Units  42 Units Subcutaneous BID Marguarite Arbour, MD      . ipratropium-albuterol (DUONEB) 0.5-2.5 (3) MG/3ML nebulizer solution 3 mL  3 mL Nebulization Q4H PRN Marguarite Arbour, MD      . lamoTRIgine (LAMICTAL) tablet 100 mg  100 mg Oral BID Marguarite Arbour, MD      . linaclotide Karlene Einstein) capsule 145 mcg  145 mcg Oral QAC breakfast Marguarite Arbour, MD      . lisinopril (PRINIVIL,ZESTRIL) tablet 20 mg  20 mg Oral Daily Marguarite Arbour, MD      . MEDLINE mouth rinse  15 mL Mouth Rinse BID Marguarite Arbour, MD      . metoprolol tartrate (LOPRESSOR) tablet 25 mg  25 mg Oral BID Marguarite Arbour, MD      . Melene Muller ON 06/19/2017] METRONIDAZOLE (TOPICAL) 0.75 % LOTN 1 application  1 application Apply externally Q M,W,F Marguarite Arbour, MD      . ondansetron (ZOFRAN) tablet 4 mg  4 mg Oral Q6H PRN Marguarite Arbour, MD       Or  . ondansetron (ZOFRAN) injection 4 mg  4 mg Intravenous Q6H PRN Marguarite Arbour, MD      . oxybutynin (DITROPAN) tablet 5 mg  5 mg Oral BID Marguarite Arbour, MD      . pantoprazole (PROTONIX) injection 40 mg  40 mg Intravenous Q12H Marguarite Arbour, MD      . polyvinyl alcohol (LIQUIFILM TEARS) 1.4 % ophthalmic solution 1 drop  1 drop Both Eyes TID PRN Marguarite Arbour, MD         Discharge  Medications: Please see discharge summary for a list of discharge medications.  Relevant Imaging Results:  Relevant Lab Results:   Additional Information SS# [id number]  Judi Cong, LCSW

## 2017-06-17 NOTE — ED Notes (Signed)
Patient transported to X-ray 

## 2017-06-17 NOTE — Consult Note (Signed)
I have been asked to see the patient by Dr. Aram Beecham , for evaluation and management of recurrent UTIs.  History of present illness: 82 year old female with a possible cause her significant for Parkinson's disease and bipolar disorder as well as recurrent urinary tract infections who presented to the emergency department earlier today with altered mental status and concern for urosepsis.  The patient has been seen and evaluated in the urology clinic for recurrent infections in the past. Her immobility is worsened and as has her urgency and incontinence. She now is being managed with a diaper which according to the significant other is being changed once a day. She rarely voids into a toilet or onto a bed pan. Initially, she has severe irritation from constant dampness with some skin breakdown.  The patient also states that she has severe constipation. She is not sure she empties her bladder completely. Her symptoms have been present for a long time, but have been progressive over the past 18 months.  In the emergency department. Patient was noted to be hypotensive with elevated lactate. Urine analysis was concerning for infection. She did have some severe altered mental status, however currently she is more lucid.  Review of systems: A 12 point comprehensive review of systems was obtained and is negative unless otherwise stated in the history of present illness.  Patient Active Problem List   Diagnosis Date Noted  . SIRS (systemic inflammatory response syndrome) (HCC) 06/17/2017  . Sepsis (HCC) 10/05/2016  . Acute encephalopathy 10/05/2016  . Diabetes mellitus with complication (HCC)   . Complicated UTI (urinary tract infection)   . Acute cystitis without hematuria   . Morbid obesity (HCC)   . UTI (urinary tract infection) 08/24/2016  . Altered mental status 08/24/2016  . Constipation 08/24/2016  . Hyperlipidemia 06/05/2015  . Type 2 diabetes mellitus with diabetic polyneuropathy  (HCC) 06/05/2015  . Vitamin D deficiency 06/05/2015  . Recurrent UTI 04/17/2015  . Vaginal atrophy 04/17/2015  . Incontinence 04/17/2015  . OAB (overactive bladder) 03/03/2015  . Bipolar depression (HCC) 01/05/2015  . GERD (gastroesophageal reflux disease) 01/05/2015  . Allergic rhinitis 01/05/2015  . Essential hypertension 01/05/2015  . Candida infection 01/05/2015  . Parkinson's disease (HCC) 01/05/2015  . Neuropathy 01/05/2015  . Diabetes mellitus with neuropathy (HCC) 01/05/2015    No current facility-administered medications on file prior to encounter.    Current Outpatient Prescriptions on File Prior to Encounter  Medication Sig Dispense Refill  . alum & mag hydroxide-simeth (MAALOX/MYLANTA) 200-200-20 MG/5ML suspension Take 30 mLs by mouth every 6 (six) hours as needed for indigestion or heartburn.    . ARIPiprazole (ABILIFY) 5 MG tablet Take 2.5 mg by mouth 2 (two) times daily.     Marland Kitchen aspirin 81 MG EC tablet Take 81 mg by mouth daily. Swallow whole.    Marland Kitchen atorvastatin (LIPITOR) 20 MG tablet Take 20 mg by mouth daily.     . carbidopa-levodopa (PARCOPA) 25-100 MG disintegrating tablet Take 1 tablet by mouth 2 (two) times daily.     . carboxymethylcellulose 1 % ophthalmic solution Apply 1 drop to eye 3 (three) times daily.     Marland Kitchen conjugated estrogens (PREMARIN) vaginal cream Place 1 Applicatorful vaginally every Monday, Wednesday, and Friday. Apply 0.5mg  (pea-sized amount)  just inside the vaginal introitus with a finger-tip every Monday, Wednesday and Friday nights.    . Cranberry 425 MG CAPS Take 1 capsule by mouth daily.     . DULoxetine (CYMBALTA) 60 MG capsule Take 60  mg by mouth daily.    Marland Kitchen gabapentin (NEURONTIN) 100 MG capsule Take 200 mg by mouth at bedtime.     . insulin glargine (LANTUS) 100 UNIT/ML injection Inject 42 Units into the skin 2 (two) times daily.     . insulin lispro (HUMALOG) 100 UNIT/ML injection Inject 2-12 Units into the skin See admin instructions. Sliding  Scale    . lamoTRIgine (LAMICTAL) 100 MG tablet Take 100 mg by mouth 2 (two) times daily.     Marland Kitchen linaclotide (LINZESS) 145 MCG CAPS capsule Take 145 mcg by mouth daily before breakfast.    . lisinopril (PRINIVIL,ZESTRIL) 20 MG tablet Take 20 mg by mouth daily.     Marland Kitchen loratadine (CLARITIN) 10 MG tablet Take 10 mg by mouth daily.    . Magnesium 400 MG CAPS Take 1 capsule by mouth daily.    . metoprolol tartrate (LOPRESSOR) 25 MG tablet Take 25 mg by mouth 2 (two) times daily. Take 25 mg by mouth twice a day for HTN    . METRONIDAZOLE, TOPICAL, (METROLOTION) 0.75 % LOTN Apply 1 application topically every Monday, Wednesday, and Friday.     . multivitamin-lutein (OCUVITE-LUTEIN) CAPS capsule Take 1 capsule by mouth daily.    Marland Kitchen oxybutynin (DITROPAN) 5 MG tablet Take 5 mg by mouth 2 (two) times daily.     . pantoprazole (PROTONIX) 40 MG tablet Take 40 mg by mouth daily.     . polyethylene glycol (MIRALAX / GLYCOLAX) packet Take 17 g by mouth 2 (two) times daily.    . sennosides-docusate sodium (SENOKOT-S) 8.6-50 MG tablet Take 2 tablets by mouth 2 (two) times daily.    Marland Kitchen amLODipine (NORVASC) 5 MG tablet Take 1 tablet (5 mg total) by mouth daily. (Patient not taking: Reported on 06/17/2017)      Past Medical History:  Diagnosis Date  . Acute encephalopathy   . Allergic rhinitis   . Ascorbic acid deficiency   . Bipolar affective disorder (HCC)   . Candidiasis   . Charcot's joint   . Chronic constipation   . Dysuria   . GERD (gastroesophageal reflux disease)   . History of falling   . HLD (hyperlipidemia)   . HTN (hypertension)   . Magnesium deficiency   . Major depressive disorder   . Mixed incontinence   . Morbid obesity (HCC)   . Muscle weakness   . Neuropathic pain   . Neuropathy   . OAB (overactive bladder)   . Obesity   . Oral pain   . Parkinson disease (HCC)   . Pneumonia   . Protein calorie malnutrition (HCC)   . Rosacea   . Sepsis (HCC) 09/2016  . Slow transit constipation    . Type 2 diabetes mellitus with diabetic polyneuropathy, with long-term current use of insulin (HCC)   . Urinary tract infection without hematuria   . Vaginal atrophy   . Vitamin deficiency     Past Surgical History:  Procedure Laterality Date  . ABDOMINAL HYSTERECTOMY    . CERVICAL CONIZATION W/BX    . CHOLECYSTECTOMY    . REVISION TOTAL KNEE ARTHROPLASTY Right     Social History  Substance Use Topics  . Smoking status: Never Smoker  . Smokeless tobacco: Never Used  . Alcohol use No    Family History  Problem Relation Age of Onset  . Colon cancer Father   . Colon cancer Sister   . Stomach cancer Sister   . Rectal cancer Sister   . Kidney  disease Neg Hx   . Bladder Cancer Neg Hx   . Prostate cancer Neg Hx     PE: Vitals:   06/17/17 1300 06/17/17 1330 06/17/17 1400 06/17/17 1542  BP: (!) 98/58 107/80 (!) 102/58 (!) 116/48  Pulse: 71 74 74 76  Resp:  Temp:    97.6 F (36.4 C)  TempSrc:    Oral  SpO2: 98% 94% 95% 93%  Weight:    106.5 kg (234 lb 11.2 oz)  Height:     (1.651 m)   Patient appears to be in no acute distress, Morbid obesity  patient is alert and oriented x3 Atraumatic normocephalic head No cervical or supraclavicular lymphadenopathy appreciated No increased work of breathing, no audible wheezes/rhonchi Regular sinus rhythm/rate Abdomen is soft Lower extremities are symmetric without appreciable edema Grossly neurologically intact No identifiable skin lesions   Recent Labs  06/17/17 1200  WBC 12.8*  HGB 11.1*  HCT 33.8*    Recent Labs  06/17/17 1200  NA 142  K 4.6  CL 108  CO2 24  GLUCOSE 91  BUN 24*  CREATININE 1.44*  CALCIUM 8.3*   No results for input(s): LABPT, INR in the last 72 hours. No results for input(s): LABURIN in the last 72 hours. Results for orders placed or performed during the hospital encounter of 01/11/17  Urine culture     Status: Abnormal   Collection Time: 01/11/17 11:02 PM  Result Value  Ref Range Status   Specimen Description URINE, RANDOM  Final   Special Requests NONE  Final   Culture 60,000 COLONIES/mL ESCHERICHIA COLI (A)  Final   Report Status 01/14/2017 FINAL  Final   Organism ID, Bacteria ESCHERICHIA COLI (A)  Final      Susceptibility   Escherichia coli - MIC*    AMPICILLIN 8 SENSITIVE Sensitive     CEFAZOLIN <=4 SENSITIVE Sensitive     CEFTRIAXONE <=1 SENSITIVE Sensitive     CIPROFLOXACIN <=0.25 SENSITIVE Sensitive     GENTAMICIN <=1 SENSITIVE Sensitive     IMIPENEM <=0.25 SENSITIVE Sensitive     NITROFURANTOIN <=16 SENSITIVE Sensitive     TRIMETH/SULFA <=20 SENSITIVE Sensitive     AMPICILLIN/SULBACTAM 4 SENSITIVE Sensitive     PIP/TAZO <=4 SENSITIVE Sensitive     Extended ESBL NEGATIVE Sensitive     * 60,000 COLONIES/mL ESCHERICHIA COLI    Imaging: I have reviewed the patient's KUB was obtained in the emergency department which does not show any evidence of obvious stone disease within the kidneys.  Imp: Unfortunate 52 F with advanced Parkinson's disease with associated immobility who has recurrent urinary tract infections. I suspect her infections are combination of incomplete bladder emptying, constipation, vaginal atrophy, and spending hours each day in soiled diapers.  Recommendations: I think the simplest way to manage this patient is to place Foley catheter. This will ensure that her bladder is empty and prevent her from having any further skin breakdown and ulcerations from her incontinence. This can stay in for a month, she should then follow up with urology in 1 month to reevaluate her symptoms and decide whether or not to place a no catheter at that time. If things work out well with a catheter, she can then have it changed at the nursing facility every 4 weeks.  In addition, the patient needs an aggressive bowel regimen including mere lax on a daily basis. She needs to have 1 soft bowel movement.  Finally, the patient should be  placed on  cranberry tablets, 2 tablets twice daily, over-the-counter. She also should be started on a probiotic well being treated for infection. I was recommended lactobacillus to help rejuvenate the natural floor of the vagina.  These orders have not been written. I will leave this to the discretion of the consulting MD, with him I did not speak.   I will get the patient a follow-up appointment in 1 month with urology clinic. Please page is with any further questions or concerns.   Berniece Salines W

## 2017-06-17 NOTE — H&P (Signed)
History and Physical    Maureen Taylor:[id number] DOB: 1935/05/05 DOA: 06/17/2017  Referring physician: Dr. Don Perking PCP: Oneal Grout, MD  Specialists: none  Chief Complaint: lethargy and unresponsiveness  HPI: Maureen Taylor is a 82 y.o. female has a past medical history significant for Parkinson's, HTN, Bipolar D/O, and recurrent UTI's who presents from SNF with unresponsiveness and lethargy. Found to be hypotensive with elevated lactic acid in ER with repeat UTI, c/w SIRS and possible sepsis. She is now admitted. The pt c/o being cold and some SOB. No N/V. Denies CP or abdominal pain  Review of Systems: The patient denies anorexia, fever, weight loss,, vision loss, decreased hearing, hoarseness, chest pain, syncope, dyspnea on exertion, peripheral edema, balance deficits, hemoptysis, abdominal pain, melena, hematochezia, severe indigestion/heartburn, hematuria, incontinence, genital sores, muscle weakness, suspicious skin lesions, transient blindness, difficulty walking, depression, unusual weight change, abnormal bleeding, enlarged lymph nodes, angioedema, and breast masses.   Past Medical History:  Diagnosis Date  . Acute encephalopathy   . Allergic rhinitis   . Ascorbic acid deficiency   . Bipolar affective disorder (HCC)   . Candidiasis   . Charcot's joint   . Chronic constipation   . Dysuria   . GERD (gastroesophageal reflux disease)   . History of falling   . HLD (hyperlipidemia)   . HTN (hypertension)   . Magnesium deficiency   . Major depressive disorder   . Mixed incontinence   . Morbid obesity (HCC)   . Muscle weakness   . Neuropathic pain   . Neuropathy   . OAB (overactive bladder)   . Obesity   . Oral pain   . Parkinson disease (HCC)   . Pneumonia   . Protein calorie malnutrition (HCC)   . Rosacea   . Sepsis (HCC) 09/2016  . Slow transit constipation   . Type 2 diabetes mellitus with diabetic polyneuropathy, with long-term current use of insulin  (HCC)   . Urinary tract infection without hematuria   . Vaginal atrophy   . Vitamin deficiency    Past Surgical History:  Procedure Laterality Date  . ABDOMINAL HYSTERECTOMY    . CERVICAL CONIZATION W/BX    . CHOLECYSTECTOMY    . REVISION TOTAL KNEE ARTHROPLASTY Right    Social History:  reports that she has never smoked. She has never used smokeless tobacco. She reports that she does not drink alcohol or use drugs.  Allergies  Allergen Reactions  . Codeine Itching  . Morphine And Related Itching  . Indocin [Indomethacin] Rash    Family History  Problem Relation Age of Onset  . Colon cancer Father   . Colon cancer Sister   . Stomach cancer Sister   . Rectal cancer Sister   . Kidney disease Neg Hx   . Bladder Cancer Neg Hx   . Prostate cancer Neg Hx     Prior to Admission medications   Medication Sig Start Date End Date Taking? Authorizing Provider  alum & mag hydroxide-simeth (MAALOX/MYLANTA) 200-200-20 MG/5ML suspension Take 30 mLs by mouth every 6 (six) hours as needed for indigestion or heartburn. 10/09/16  Yes Hongalgi, Maximino Greenland, MD  amoxicillin-clavulanate (AUGMENTIN) 875-125 MG tablet Take 1 tablet by mouth 2 (two) times daily. 06/15/17 06/21/17 Yes [provider]  ARIPiprazole (ABILIFY) 5 MG tablet Take 2.5 mg by mouth 2 (two) times daily.    Yes [provider]  aspirin 81 MG EC tablet Take 81 mg by mouth daily. Swallow whole.   Yes  [provider]  atorvastatin (LIPITOR) 20 MG tablet Take 20 mg by mouth daily.    Yes [provider]  carbidopa-levodopa (PARCOPA) 25-100 MG disintegrating tablet Take 1 tablet by mouth 2 (two) times daily.    Yes [provider]  carboxymethylcellulose 1 % ophthalmic solution Apply 1 drop to eye 3 (three) times daily.    Yes [provider]  conjugated estrogens (PREMARIN) vaginal cream Place 1 Applicatorful vaginally every Monday, Wednesday, and Friday. Apply 0.5mg  (pea-sized amount)   just inside the vaginal introitus with a finger-tip every Monday, Wednesday and Friday nights. 10/10/16  Yes Hongalgi, Maximino Greenland, MD  Cranberry 425 MG CAPS Take 1 capsule by mouth daily.    Yes [provider]  DULoxetine (CYMBALTA) 60 MG capsule Take 60 mg by mouth daily.   Yes [provider]  gabapentin (NEURONTIN) 100 MG capsule Take 200 mg by mouth at bedtime.    Yes [provider]  hydrOXYzine (ATARAX/VISTARIL) 25 MG tablet Take 25 mg by mouth 3 (three) times daily.   Yes [provider]  insulin glargine (LANTUS) 100 UNIT/ML injection Inject 42 Units into the skin 2 (two) times daily.    Yes [provider]  insulin lispro (HUMALOG) 100 UNIT/ML injection Inject 2-12 Units into the skin See admin instructions. Sliding Scale   Yes [provider]  lamoTRIgine (LAMICTAL) 100 MG tablet Take 100 mg by mouth 2 (two) times daily.    Yes [provider]  linaclotide (LINZESS) 145 MCG CAPS capsule Take 145 mcg by mouth daily before breakfast.   Yes [provider]  lisinopril (PRINIVIL,ZESTRIL) 20 MG tablet Take 20 mg by mouth daily.    Yes [provider]  loratadine (CLARITIN) 10 MG tablet Take 10 mg by mouth daily.   Yes [provider]  Magnesium 400 MG CAPS Take 1 capsule by mouth daily.   Yes [provider]  metoprolol tartrate (LOPRESSOR) 25 MG tablet Take 25 mg by mouth 2 (two) times daily. Take 25 mg by mouth twice a day for HTN   Yes [provider]  METRONIDAZOLE, TOPICAL, (METROLOTION) 0.75 % LOTN Apply 1 application topically every Monday, Wednesday, and Friday.    Yes [provider]  multivitamin-lutein (OCUVITE-LUTEIN) CAPS capsule Take 1 capsule by mouth daily.   Yes [provider]  oxybutynin (DITROPAN) 5 MG tablet Take 5 mg by mouth 2 (two) times daily.    Yes [provider]  pantoprazole (PROTONIX) 40 MG tablet Take 40 mg by mouth daily.    Yes  [provider]  polyethylene glycol (MIRALAX / GLYCOLAX) packet Take 17 g by mouth 2 (two) times daily.   Yes [provider]  sennosides-docusate sodium (SENOKOT-S) 8.6-50 MG tablet Take 2 tablets by mouth 2 (two) times daily.   Yes [provider]  amLODipine (NORVASC) 5 MG tablet Take 1 tablet (5 mg total) by mouth daily. Patient not taking: Reported on 06/17/2017 10/10/16   Elease Etienne, MD   Physical Exam: Vitals:   06/17/17 1154 06/17/17 1230 06/17/17 1300 06/17/17 1330  BP:  (!) 104/45 (!) 98/58 107/80  Pulse:  73 71 74  Resp:  (!) 5  18  Temp:      TempSrc:      SpO2:  96% 98% 94%  Weight: 104.3 kg (230 lb)     Height:  (1.651 m)        General:  No apparent distress, WDWN, [place]/AT  Eyes: PERRL, EOMI, no scleral icterus, conjunctiva clear  ENT: dry oropharynx without exudate, TM's benign, dentition fair  Neck: supple, no lymphadenopathy. No bruits or thyromegaly  Cardiovascular: regular rate without MRG; 2+ peripheral pulses, no JVD, 2+ peripheral edema  Respiratory: scattered rhonchi without wheezes or rales. No dullness. Respiratory effort normal  Abdomen: distended, non tender to palpation, positive bowel sounds, no guarding, no rebound  Skin: no rashes or lesions  Musculoskeletal: normal bulk and tone, no joint swelling  Psychiatric: normal mood and affect, mild lethargy, oriented to person and place only  Neurologic: CN 2-12 grossly intact, Motor strength 5/5 in all 4 groups with symmetric DTR's and non-focal sensory exam  Labs on Admission:  Basic Metabolic Panel:  Recent Labs Lab 06/17/17 1200  NA 142  K 4.6  CL 108  CO2 24  GLUCOSE 91  BUN 24*  CREATININE 1.44*  CALCIUM 8.3*   Liver Function Tests:  Recent Labs Lab 06/17/17 1200  AST 35  ALT 6*  ALKPHOS 114  BILITOT 0.7  PROT 6.9  ALBUMIN 2.9*   No results for input(s): LIPASE, AMYLASE in the last 168 hours. No results for input(s): AMMONIA in the  last 168 hours. CBC:  Recent Labs Lab 06/17/17 1200  WBC 12.8*  NEUTROABS 8.8*  HGB 11.1*  HCT 33.8*  MCV 89.6  PLT 345   Cardiac Enzymes: No results for input(s): CKTOTAL, CKMB, CKMBINDEX, TROPONINI in the last 168 hours.  BNP (last 3 results) No results for input(s): BNP in the last 8760 hours.  ProBNP (last 3 results) No results for input(s): PROBNP in the last 8760 hours.  CBG:  Recent Labs Lab 06/17/17 1206  GLUCAP 84    Radiological Exams on Admission: Dg Chest Portable 1 View  Result Date: 06/17/2017 CLINICAL DATA:  Altered mental status and UTI EXAM: PORTABLE CHEST 1 VIEW COMPARISON:  01/11/2017 FINDINGS: Cardiac shadow is mildly enlarged. The lungs are well aerated bilaterally. No focal infiltrate or sizable effusion is seen. No bony abnormality is noted. IMPRESSION: No active disease. Electronically Signed   By: Alcide Clever M.D.   On: 06/17/2017 12:44    EKG: Independently reviewed.  Assessment/Plan Principal Problem:   SIRS (systemic inflammatory response syndrome) (HCC) Active Problems:   Parkinson's disease (HCC)   UTI (urinary tract infection)   Acute encephalopathy   Will admit to floor with IV fluids and IV ABX. Cultures sent. Will order plain films of the abdomen. Consult Urology and Neurology. PT and CSW to evaluate. Repeat labs in AM.  Diet: soft Fluids: NS@75  DVT Prophylaxis: Lovenox  Code Status: FULL  Family Communication: yes  Disposition Plan: SNF  Time spent: 50 min

## 2017-06-17 NOTE — ED Provider Notes (Signed)
[facility] Emergency Department Provider Note  ____________________________________________  Time seen: Approximately 12:21 PM  I have reviewed the triage vital signs and the nursing notes.   HISTORY  Chief Complaint Altered Mental Status  Level 5 caveat:  Portions of the history and physical were unable to be obtained due to AMS   HPI Maureen Taylor is a 82 y.o. female with a history of diabetes, Parkinson's disease, several episodes of acute encephalitis in the setting of UTI who presents for evaluation of altered mental status. History is gathered from patient's daughter. Patient has had declining mental status starting last week. On Friday she was started on amoxicillin for a positive urinalysis. Yesterday she left [facility] to spend the weekend at a friend's house. This morning she was found to be very lethargic and less responsive which prompted visit to the emergency room. Patient will open her eyes to sternal rub. She is able to tell me that she has no pain and that she is in the hospital. Not answering any other questions. Falls back asleep. per patient's daughter patient is usually alert and oriented at baseline, able to carry normal conversations, to feed and dress herself.  Past Medical History:  Diagnosis Date  . Acute encephalopathy   . Allergic rhinitis   . Ascorbic acid deficiency   . Bipolar affective disorder (HCC)   . Candidiasis   . Charcot's joint   . Chronic constipation   . Dysuria   . GERD (gastroesophageal reflux disease)   . History of falling   . HLD (hyperlipidemia)   . HTN (hypertension)   . Magnesium deficiency   . Major depressive disorder   . Mixed incontinence   . Morbid obesity (HCC)   . Muscle weakness   . Neuropathic pain   . Neuropathy   . OAB (overactive bladder)   . Obesity   . Oral pain   . Parkinson disease (HCC)   . Pneumonia   . Protein calorie malnutrition (HCC)   . Rosacea   . Sepsis (HCC)  09/2016  . Slow transit constipation   . Type 2 diabetes mellitus with diabetic polyneuropathy, with long-term current use of insulin (HCC)   . Urinary tract infection without hematuria   . Vaginal atrophy   . Vitamin deficiency     Patient Active Problem List   Diagnosis Date Noted  . Sepsis (HCC) 10/05/2016  . Acute encephalopathy 10/05/2016  . Diabetes mellitus with complication (HCC)   . Complicated UTI (urinary tract infection)   . Acute cystitis without hematuria   . Morbid obesity (HCC)   . UTI (urinary tract infection) 08/24/2016  . Altered mental status 08/24/2016  . Constipation 08/24/2016  . Hyperlipidemia 06/05/2015  . Type 2 diabetes mellitus with diabetic polyneuropathy (HCC) 06/05/2015  . Vitamin D deficiency 06/05/2015  . Recurrent UTI 04/17/2015  . Vaginal atrophy 04/17/2015  . Incontinence 04/17/2015  . OAB (overactive bladder) 03/03/2015  . Bipolar depression (HCC) 01/05/2015  . GERD (gastroesophageal reflux disease) 01/05/2015  . Allergic rhinitis 01/05/2015  . Essential hypertension 01/05/2015  . Candida infection 01/05/2015  . Parkinson's disease (HCC) 01/05/2015  . Neuropathy 01/05/2015  . Diabetes mellitus with neuropathy (HCC) 01/05/2015    Past Surgical History:  Procedure Laterality Date  . ABDOMINAL HYSTERECTOMY    . CERVICAL CONIZATION W/BX    . CHOLECYSTECTOMY    . REVISION TOTAL KNEE ARTHROPLASTY Right     Prior to Admission medications   Medication Sig Start Date  End Date Taking? Authorizing Provider  alum & mag hydroxide-simeth (MAALOX/MYLANTA) 200-200-20 MG/5ML suspension Take 30 mLs by mouth every 6 (six) hours as needed for indigestion or heartburn. 10/09/16  Yes Hongalgi, Maximino Greenland, MD  amoxicillin-clavulanate (AUGMENTIN) 875-125 MG tablet Take 1 tablet by mouth 2 (two) times daily. 06/15/17 06/21/17 Yes [provider]  ARIPiprazole (ABILIFY) 5 MG tablet Take 2.5 mg by mouth 2 (two) times daily.    Yes [provider]    aspirin 81 MG EC tablet Take 81 mg by mouth daily. Swallow whole.   Yes [provider]  atorvastatin (LIPITOR) 20 MG tablet Take 20 mg by mouth daily.    Yes [provider]  carbidopa-levodopa (PARCOPA) 25-100 MG disintegrating tablet Take 1 tablet by mouth 2 (two) times daily.    Yes [provider]  carboxymethylcellulose 1 % ophthalmic solution Apply 1 drop to eye 3 (three) times daily.    Yes [provider]  conjugated estrogens (PREMARIN) vaginal cream Place 1 Applicatorful vaginally every Monday, Wednesday, and Friday. Apply 0.5mg  (pea-sized amount)  just inside the vaginal introitus with a finger-tip every Monday, Wednesday and Friday nights. 10/10/16  Yes Hongalgi, Maximino Greenland, MD  Cranberry 425 MG CAPS Take 1 capsule by mouth daily.    Yes [provider]  DULoxetine (CYMBALTA) 60 MG capsule Take 60 mg by mouth daily.   Yes [provider]  gabapentin (NEURONTIN) 100 MG capsule Take 200 mg by mouth at bedtime.    Yes [provider]  hydrOXYzine (ATARAX/VISTARIL) 25 MG tablet Take 25 mg by mouth 3 (three) times daily.   Yes [provider]  insulin glargine (LANTUS) 100 UNIT/ML injection Inject 42 Units into the skin 2 (two) times daily.    Yes [provider]  insulin lispro (HUMALOG) 100 UNIT/ML injection Inject 2-12 Units into the skin See admin instructions. Sliding Scale   Yes [provider]  lamoTRIgine (LAMICTAL) 100 MG tablet Take 100 mg by mouth 2 (two) times daily.    Yes [provider]  linaclotide (LINZESS) 145 MCG CAPS capsule Take 145 mcg by mouth daily before breakfast.   Yes [provider]  lisinopril (PRINIVIL,ZESTRIL) 20 MG tablet Take 20 mg by mouth daily.    Yes [provider]  loratadine (CLARITIN) 10 MG tablet Take 10 mg by mouth daily.   Yes [provider]  Magnesium 400 MG CAPS Take 1 capsule by mouth daily.   Yes [provider]   metoprolol tartrate (LOPRESSOR) 25 MG tablet Take 25 mg by mouth 2 (two) times daily. Take 25 mg by mouth twice a day for HTN   Yes [provider]  METRONIDAZOLE, TOPICAL, (METROLOTION) 0.75 % LOTN Apply 1 application topically every Monday, Wednesday, and Friday.    Yes [provider]  multivitamin-lutein (OCUVITE-LUTEIN) CAPS capsule Take 1 capsule by mouth daily.   Yes [provider]  oxybutynin (DITROPAN) 5 MG tablet Take 5 mg by mouth 2 (two) times daily.    Yes [provider]  pantoprazole (PROTONIX) 40 MG tablet Take 40 mg by mouth daily.    Yes [provider]  polyethylene glycol (MIRALAX / GLYCOLAX) packet Take 17 g by mouth 2 (two) times daily.   Yes [provider]  sennosides-docusate sodium (SENOKOT-S) 8.6-50 MG tablet Take 2 tablets by mouth 2 (two) times daily.   Yes [provider]  amLODipine (NORVASC) 5 MG tablet Take 1 tablet (5 mg total) by mouth  daily. Patient not taking: Reported on 06/17/2017 10/10/16   Elease Etienne, MD    Allergies Codeine; Morphine and related; and Indocin [indomethacin]  Family History  Problem Relation Age of Onset  . Colon cancer Father   . Colon cancer Sister   . Stomach cancer Sister   . Rectal cancer Sister   . Kidney disease Neg Hx   . Bladder Cancer Neg Hx   . Prostate cancer Neg Hx     Social History Social History  Substance Use Topics  . Smoking status: Never Smoker  . Smokeless tobacco: Never Used  . Alcohol use No    Review of Systems + AMS Level 5 caveat:  Portions of the history and physical were unable to be obtained due to AMS  ____________________________________________   PHYSICAL EXAM:  VITAL SIGNS: ED Triage Vitals  Enc Vitals Group     BP 06/17/17 1153 (!) 121/44     Pulse Rate 06/17/17 1153 74     Resp 06/17/17 1153 20     Temp 06/17/17 1153 (!) 97.5 F (36.4 C)     Temp Source 06/17/17 1153 Oral     SpO2 06/17/17 1153 95 %      Weight 06/17/17 1154 230 lb (104.3 kg)     Height 06/17/17 1154  (1.651 m)     Head Circumference --      [facility] Flow --      Pain Score --      Pain Loc --      Pain Edu? --      Excl. in GC? --     Constitutional: Sleeping, arouses to sternal rub, no distress.  HEENT:      Head: Normocephalic and atraumatic.         Eyes: Conjunctivae are normal. Sclera is non-icteric.       Mouth/Throat: Mucous membranes are dry.       Neck: Supple with no signs of meningismus. Cardiovascular: Regular rate and rhythm. No murmurs, gallops, or rubs. 2+ symmetrical distal pulses are present in all extremities. No JVD. Respiratory: Normal respiratory effort. Lungs are clear to auscultation bilaterally. No wheezes, crackles, or rhonchi.  Gastrointestinal: Obese, non tender, and non distended with positive bowel sounds. No rebound or guarding. Musculoskeletal: 1+ pitting edema on b/l LE Neurologic: GCS 13. Face is symmetric. Moving all extremities.  Skin: Skin is warm with diffuse erythematous scaly rash. ____________________________________________   LABS (all labs ordered are listed, but only abnormal results are displayed)  Labs Reviewed  CBC WITH DIFFERENTIAL/PLATELET - Abnormal; Notable for the following:       Result Value   WBC 12.8 (*)    RBC 3.77 (*)    Hemoglobin 11.1 (*)    HCT 33.8 (*)    Neutro Abs 8.8 (*)    Monocytes Absolute 1.0 (*)    Eosinophils Absolute 0.9 (*)    All other components within normal limits  COMPREHENSIVE METABOLIC PANEL - Abnormal; Notable for the following:    BUN 24 (*)    Creatinine, Ser 1.44 (*)    Calcium 8.3 (*)    Albumin 2.9 (*)    ALT 6 (*)    GFR calc non Af Amer 34 (*)    GFR calc Af Amer 39 (*)    All other components within normal limits  LACTIC ACID, PLASMA - Abnormal; Notable for the following:    Lactic Acid, Venous 2.1 (*)    All other components within normal  limits  URINALYSIS, COMPLETE (UACMP) WITH MICROSCOPIC - Abnormal; Notable  for the following:    Color, Urine YELLOW (*)    APPearance CLOUDY (*)    Protein, ur 100 (*)    Leukocytes, UA LARGE (*)    Bacteria, UA RARE (*)    Squamous Epithelial / LPF 0-5 (*)    All other components within normal limits  URINE CULTURE  CULTURE, BLOOD (ROUTINE X 2)  CULTURE, BLOOD (ROUTINE X 2)  GLUCOSE, CAPILLARY  ETHANOL  URINE DRUG SCREEN, QUALITATIVE ([facility] ONLY)  LACTIC ACID, PLASMA   ____________________________________________  EKG  ED ECG REPORT I, Nita Sickle, the attending physician, personally viewed and interpreted this ECG.  Normal sinus rhythm, rate of 78, normal intervals, normal axis, no ST elevations or depressions.  ____________________________________________  RADIOLOGY  CXR: Negative  ____________________________________________   PROCEDURES  Procedure(s) performed: None Procedures Critical Care performed: yes  CRITICAL CARE Performed by: Nita Sickle  ?  Total critical care time: 35 min  Critical care time was exclusive of separately billable procedures and treating other patients.  Critical care was necessary to treat or prevent imminent or life-threatening deterioration.  Critical care was time spent personally by me on the following activities: development of treatment plan with patient and/or surrogate as well as nursing, discussions with consultants, evaluation of patient's response to treatment, examination of patient, obtaining history from patient or surrogate, ordering and performing treatments and interventions, ordering and review of laboratory studies, ordering and review of radiographic studies, pulse oximetry and re-evaluation of patient's condition.  ____________________________________________   INITIAL IMPRESSION / ASSESSMENT AND PLAN / ED COURSE   82 y.o. female with a history of diabetes, Parkinson's disease, several episodes of acute encephalitis in the setting of UTI who presents for evaluation of  altered mental status.  BG 99 per EMS. GCS 13 on arrival, patient very somnolent but responsive. Vitals WNL. Will check labs, UA, CXR to rule out sepsis.    _________________________ 2:01 PM on 06/17/2017 -----------------------------------------  UA positive for urinary tract infection, lactate elevated at 2.1 concerning for urosepsis. Patient will be given Rocephin, IV fluids, and be admitted to the hospitalist service.  Pertinent labs & imaging results that were available during my care of the patient were reviewed by me and considered in my medical decision making (see chart for details).    ____________________________________________   FINAL CLINICAL IMPRESSION(S) / ED DIAGNOSES  Final diagnoses:  Sepsis, due to unspecified organism (HCC)  Encephalopathy      NEW MEDICATIONS STARTED DURING THIS VISIT:  New Prescriptions   No medications on file     Note:  This document was prepared using Dragon voice recognition software and may include unintentional dictation errors.    Don Perking, [place], MD 06/17/17 [phone]

## 2017-06-17 NOTE — ED Notes (Signed)
Code sepsis called to [facility] 

## 2017-06-18 DIAGNOSIS — R6510 Systemic inflammatory response syndrome (SIRS) of non-infectious origin without acute organ dysfunction: Secondary | ICD-10-CM

## 2017-06-18 LAB — GLUCOSE, CAPILLARY
GLUCOSE-CAPILLARY: 59 mg/dL — AB (ref 65–99)
GLUCOSE-CAPILLARY: 60 mg/dL — AB (ref 65–99)
GLUCOSE-CAPILLARY: 64 mg/dL — AB (ref 65–99)
GLUCOSE-CAPILLARY: 131 mg/dL — AB (ref 65–99)
GLUCOSE-CAPILLARY: 134 mg/dL — AB (ref 65–99)
GLUCOSE-CAPILLARY: 114 mg/dL — AB (ref 65–99)
GLUCOSE-CAPILLARY: 119 mg/dL — AB (ref 65–99)
Glucose-Capillary: 133 mg/dL — ABNORMAL HIGH (ref 65–99)
Glucose-Capillary: 173 mg/dL — ABNORMAL HIGH (ref 65–99)
Glucose-Capillary: 194 mg/dL — ABNORMAL HIGH (ref 65–99)
Glucose-Capillary: 232 mg/dL — ABNORMAL HIGH (ref 65–99)

## 2017-06-18 LAB — COMPREHENSIVE METABOLIC PANEL
ALBUMIN: 2.4 g/dL — AB (ref 3.5–5.0)
ALK PHOS: 99 U/L (ref 38–126)
ALT: 5 U/L — ABNORMAL LOW (ref 14–54)
ANION GAP: 5 (ref 5–15)
AST: 35 U/L (ref 15–41)
BUN: 21 mg/dL — ABNORMAL HIGH (ref 6–20)
CALCIUM: 7.9 mg/dL — AB (ref 8.9–10.3)
CO2: 25 mmol/L (ref 22–32)
Chloride: 114 mmol/L — ABNORMAL HIGH (ref 101–111)
Creatinine, Ser: 1.2 mg/dL — ABNORMAL HIGH (ref 0.44–1.00)
GFR calc Af Amer: 48 mL/min — ABNORMAL LOW (ref >60)
GFR calc non Af Amer: 42 mL/min — ABNORMAL LOW (ref >60)
GLUCOSE: 153 mg/dL — AB (ref 65–99)
POTASSIUM: 4.3 mmol/L (ref 3.5–5.1)
Sodium: 144 mmol/L (ref 135–145)
TOTAL PROTEIN: 5.8 g/dL — AB (ref 6.5–8.1)
Total Bilirubin: 0.4 mg/dL (ref 0.3–1.2)

## 2017-06-18 LAB — URINE CULTURE

## 2017-06-18 LAB — HEMOGLOBIN A1C
HEMOGLOBIN A1C: 9.2 % — AB (ref 4.8–5.6)
MEAN PLASMA GLUCOSE: 217.34 mg/dL

## 2017-06-18 LAB — CBC
HCT: 29.3 % — ABNORMAL LOW (ref 35.0–47.0)
HEMOGLOBIN: 9.7 g/dL — AB (ref 12.0–16.0)
MCH: 29.8 pg (ref 26.0–34.0)
MCHC: 33.1 g/dL (ref 32.0–36.0)
MCV: 90 fL (ref 80.0–100.0)
PLATELETS: 279 10*3/uL (ref 150–440)
RBC: 3.26 MIL/uL — AB (ref 3.80–5.20)
RDW: 14.2 % (ref 11.5–14.5)
WBC: 11.3 10*3/uL — AB (ref 3.6–11.0)

## 2017-06-18 MED ORDER — PANTOPRAZOLE SODIUM 40 MG PO TBEC
40.0000 mg | DELAYED_RELEASE_TABLET | Freq: Two times a day (BID) | ORAL | Status: DC
  Administered 2017-06-18 – 2017-06-24 (×9): 40 mg via ORAL
  Filled 2017-06-18 (×10): qty 1

## 2017-06-18 MED ORDER — HYDROCERIN EX CREA
TOPICAL_CREAM | Freq: Two times a day (BID) | CUTANEOUS | Status: DC
  Administered 2017-06-19 – 2017-06-22 (×5): via TOPICAL
  Administered 2017-06-23: 1 via TOPICAL
  Administered 2017-06-23 – 2017-06-24 (×2): via TOPICAL
  Filled 2017-06-18 (×2): qty 113

## 2017-06-18 MED ORDER — GLUCOSE 4 G PO CHEW
CHEWABLE_TABLET | ORAL | Status: AC
  Filled 2017-06-18: qty 1

## 2017-06-18 MED ORDER — INSULIN GLARGINE 100 UNIT/ML ~~LOC~~ SOLN
30.0000 [IU] | Freq: Two times a day (BID) | SUBCUTANEOUS | Status: DC
  Administered 2017-06-18 – 2017-06-20 (×4): 30 [IU] via SUBCUTANEOUS
  Filled 2017-06-18 (×8): qty 0.3

## 2017-06-18 MED ORDER — HYDROXYZINE HCL 25 MG PO TABS
25.0000 mg | ORAL_TABLET | Freq: Two times a day (BID) | ORAL | Status: DC | PRN
  Administered 2017-06-18: 25 mg via ORAL
  Filled 2017-06-18: qty 1

## 2017-06-18 MED ORDER — DEXTROSE 50 % IV SOLN
1.0000 | Freq: Once | INTRAVENOUS | Status: AC
  Administered 2017-06-18: 50 mL via INTRAVENOUS

## 2017-06-18 MED ORDER — DIPHENHYDRAMINE HCL 25 MG PO CAPS
25.0000 mg | ORAL_CAPSULE | Freq: Four times a day (QID) | ORAL | Status: DC | PRN
  Administered 2017-06-18 – 2017-06-22 (×2): 25 mg via ORAL
  Filled 2017-06-18 (×3): qty 1

## 2017-06-18 MED ORDER — DEXTROSE 50 % IV SOLN
INTRAVENOUS | Status: AC
  Filled 2017-06-18: qty 50

## 2017-06-18 MED ORDER — DEXTROSE 5 % IV SOLN
1.0000 g | INTRAVENOUS | Status: DC
  Administered 2017-06-19 – 2017-06-20 (×2): 1 g via INTRAVENOUS
  Filled 2017-06-18 (×2): qty 10

## 2017-06-18 MED ORDER — FAMOTIDINE 20 MG PO TABS
20.0000 mg | ORAL_TABLET | Freq: Two times a day (BID) | ORAL | Status: DC
  Administered 2017-06-18 – 2017-06-24 (×9): 20 mg via ORAL
  Filled 2017-06-18 (×10): qty 1

## 2017-06-18 NOTE — Progress Notes (Signed)
Notified Dr. Desiree Hane that pt had sugar of 119, per MD hold lantus morning dose as she had to be given dextrose last night. Will continue to monitor.

## 2017-06-18 NOTE — Progress Notes (Signed)
Hypoglycemic Event  CBG: 60  Treatment: 2 juices with 2 packets of sugar  Symptoms: skin slightly paler, patient was conversing easily with staff  Follow-up CBG: Time:0221 CBG Result:59  Possible Reasons for Event: diagnosis Treatment: 2 more orange juices with 2 packets sugar Recheck  CBG: 64  Comments/MD notified:Spoke with Dr Sheryle Hail- order for 1 amp D50. Administered. Recheck CBG: 134    Maureen Taylor A

## 2017-06-18 NOTE — Progress Notes (Signed)
PHARMACIST - PHYSICIAN COMMUNICATION  DR:   Elisabeth Pigeon  CONCERNING: IV to Oral Route Change Policy  RECOMMENDATION: This patient is receiving pantoprazole by the intravenous route.  Based on criteria approved by the Pharmacy and Therapeutics Committee, the intravenous medication(s) is/are being converted to the equivalent oral dose form(s).   DESCRIPTION: These criteria include:  The patient is eating (either orally or via tube) and/or has been taking other orally administered medications for a least 24 hours  The patient has no evidence of active gastrointestinal bleeding or impaired GI absorption (gastrectomy, short bowel, patient on TNA or NPO).  If you have questions about this conversion, please contact the Pharmacy Department   Cindi Carbon, [facility] 06/18/2017 1:37 PM

## 2017-06-18 NOTE — Clinical Social Work Note (Signed)
Clinical Social Work Assessment  Patient Details  Name: Maureen Taylor MRN: [id number] Date of Birth: 02-08-1935  Date of referral:  06/18/17               Reason for consult:  Facility Placement                Permission sought to share information with:  Facility Medical sales representative Permission granted to share information::  Yes, Verbal Permission Granted  Name::        Agency::  [facility]  Relationship::     Contact Information:  [phone]  Housing/Transportation Living arrangements for the past 2 months:  Skilled Nursing Facility Source of Information:  Medical Team, Facility, Adult Children Patient Interpreter Needed:  None Criminal Activity/Legal Involvement Pertinent to Current Situation/Hospitalization:  No - Comment as needed Significant Relationships:  Adult Children, [employer], Church, Friend Lives with:  Facility Resident Do you feel safe going back to the place where you live?  Yes Need for family participation in patient care:  No (Coment)  Care giving concerns:  Patient admitted from [facility]   Social Worker assessment / plan:  CSW attempted to meet with the patient and her family at bedside; however, no family was available and the patient was lethargic. The CSW contacted the patient's daughter Maureen Taylor who confirmed that the patient is a LTC resident at [facility]. The plan is for the patient to return when stable.  [facility] at [facility] confirmed residency and the patient's ability to return when stable. DC is unknown; however, according to consultation notes from Neurology and Urology, the patient is improving. Possible dc as soon as tomorrow.  Employment status:  Retired Database administrator PT Recommendations:  Not assessed at this time Information / Referral to community resources:     Patient/Family's Response to care:  The patient was lethargic. The patient's daughter thanked the CSW.  Patient/Family's Understanding of and  Emotional Response to Diagnosis, Current Treatment, and Prognosis: The patient's family is in agreement with the discharge plan.  Emotional Assessment Appearance:  Appears stated age Attitude/Demeanor/Rapport:  Lethargic Affect (typically observed):  Accepting Orientation:  Oriented to Self, Oriented to Place, Oriented to Situation, Oriented to  Time Alcohol / Substance use:  Never Used Psych involvement (Current and /or in the community):  No (Comment)  Discharge Needs  Concerns to be addressed:  Care Coordination, Discharge Planning Concerns Readmission within the last 30 days:  No Current discharge risk:  Chronically ill Barriers to Discharge:  Continued Medical Work up   [employer], LCSW 06/18/2017, 3:41 PM

## 2017-06-18 NOTE — Progress Notes (Signed)
[facility] [place] at [facility]   PATIENT NAME: Maureen Taylor    MR#:  [id number]  DATE OF BIRTH:  [birth year]  SUBJECTIVE:  CHIEF COMPLAINT:   Chief Complaint  Patient presents with  . Altered Mental Status    Have parkinson's dz, lives in NH- no bladder control for last >  1 year. Sent with sepsis and UTI. More alert today. REVIEW OF SYSTEMS:   Pt is not able to give much details.  ROS  DRUG ALLERGIES:   Allergies  Allergen Reactions  . Codeine Itching  . Morphine And Related Itching  . Indocin [Indomethacin] Rash    VITALS:  Blood pressure (!) 114/34, pulse 95, temperature 98 F (36.7 C), temperature source Oral, resp. rate 18, height  (1.651 m), weight 108.8 kg (239 lb 12.8 oz), SpO2 94 %.  PHYSICAL EXAMINATION:  GENERAL:  82 y.o.-year-old patient lying in the bed with no acute distress.  EYES: Pupils equal, round, reactive to light and accommodation. No scleral icterus. Extraocular muscles intact.  HEENT: Head atraumatic, normocephalic. Oropharynx and nasopharynx clear.  NECK:  Supple, no jugular venous distention. No thyroid enlargement, no tenderness.  LUNGS: Normal breath sounds bilaterally, no wheezing, rales,rhonchi or crepitation. No use of accessory muscles of respiration.  CARDIOVASCULAR: S1, S2 normal. No murmurs, rubs, or gallops.  ABDOMEN: Soft, nontender, nondistended. Bowel sounds present. No organomegaly or mass.  EXTREMITIES: both legs excessive skin flaking and redness. No edema. NEUROLOGIC: Cranial nerves II through XII are intact. Muscle strength 2-3/5 in all extremities. Sensation intact. Gait not checked.  PSYCHIATRIC: The patient is alert and oriented x 1.  SKIN: all over the body- redness and fine flaking present.   Physical Exam LABORATORY PANEL:   CBC  Recent Labs Lab 06/18/17 0343  WBC 11.3*  HGB 9.7*  HCT 29.3*  PLT 279    ------------------------------------------------------------------------------------------------------------------  Chemistries   Recent Labs Lab 06/18/17 0343  NA 144  K 4.3  CL 114*  CO2 25  GLUCOSE 153*  BUN 21*  CREATININE 1.20*  CALCIUM 7.9*  AST 35  ALT 5*  ALKPHOS 99  BILITOT 0.4   ------------------------------------------------------------------------------------------------------------------  Cardiac Enzymes No results for input(s): TROPONINI in the last 168 hours. ------------------------------------------------------------------------------------------------------------------  RADIOLOGY:  Dg Chest Portable 1 View  Result Date: 06/17/2017 CLINICAL DATA:  Altered mental status and UTI EXAM: PORTABLE CHEST 1 VIEW COMPARISON:  01/11/2017 FINDINGS: Cardiac shadow is mildly enlarged. The lungs are well aerated bilaterally. No focal infiltrate or sizable effusion is seen. No bony abnormality is noted. IMPRESSION: No active disease. Electronically Signed   By: Alcide Clever M.D.   On: 06/17/2017 12:44   Dg Abd 2 Views  Result Date: 06/17/2017 CLINICAL DATA:  Abdominal pain. EXAM: ABDOMEN - 2 VIEW COMPARISON:  10/08/2016 FINDINGS: No evidence for free air. Surgical clips in the right upper abdomen. Right hip replacement. Gas in the small and large bowel. Multilevel degenerative disease in the thoracic and lumbar spine. IMPRESSION: No acute abnormality.  Nonobstructive bowel gas pattern. Electronically Signed   By: Richarda Overlie M.D.   On: 06/17/2017 15:18    ASSESSMENT AND PLAN:   Principal Problem:   SIRS (systemic inflammatory response syndrome) (HCC) Active Problems:   Parkinson's disease (HCC)   UTI (urinary tract infection)   Acute encephalopathy  * Sepsis, Acute metabolic encephalopathy   UTI     IV Abx, cx sent   Appreciated help of Urology   FOley and may need proper treatment  of constipation.  * DM   COnt Lantus and ISS  * Hypertension    Cont  metoprolol.  * Skin disease   For last few weeks, have skin redness and flaking as per daughter.    She is advised to go to Dermatologist for biopsy asa per daughter.    I will advise to cont with the plan.  * Parkinson's disease   Cont home meds      All the records are reviewed and case discussed with Care Management/Social Workerr. Management plans discussed with the patient, family and they are in agreement.  CODE STATUS: Full.  TOTAL TIME TAKING CARE OF THIS PATIENT: 35 minutes.    POSSIBLE D/C IN 1-2 DAYS, DEPENDING ON CLINICAL CONDITION.   Altamese Dilling M.D on 06/18/2017   Between 7am to 6pm - Pager - [phone]  After 6pm go to [email or web address] - password [email or web address]  [facility] [place] [facility]  Office  [phone]  CC: Primary care physician; Oneal Grout, MD  Note: This dictation was prepared with Dragon dictation along with smaller phrase technology. Any transcriptional errors that result from this process are unintentional.

## 2017-06-18 NOTE — Evaluation (Signed)
Physical Therapy Evaluation Patient Details Name: Maureen Taylor MRN: [id number] DOB: 01-Feb-1935 Today's Date: 06/18/2017   History of Present Illness  Pt is a 82 y.o. female presenting to hospital with AMS, lethargy, and decreased responsiveness.  Pt admitted with SIRS.  PMH includes: Parkinson's disease, DM, htn, recurrent UTI, bipolar disorder.  Clinical Impression  Prior to hospital admission, pt was walking shorting distances (about 20 feet) with rollator on good days or using w/c (pt is supposed to call for staff assistance with mobility); pt was also receiving therapy prior to admission.  Pt lives at [facility].  Currently pt is unable to fully sit up on edge of bed with 1 assist.  Pt did follow simple 1 step commands fairly consistently during session but also tended to close her eyes and fall asleep at times as well.  Pt would benefit from skilled PT to address noted impairments and functional limitations (see below for any additional details).  Upon hospital discharge, recommend pt discharge to [facility].    Follow Up Recommendations SNF    Equipment Recommendations  Rolling walker with 5" wheels    Recommendations for Other Services OT consult     Precautions / Restrictions Precautions Precautions: Fall Restrictions Weight Bearing Restrictions: No      Mobility  Bed Mobility Overal bed mobility: Needs Assistance Bed Mobility: Rolling;Sidelying to Sit Rolling: Max assist Sidelying to sit: Total assist;HOB elevated       General bed mobility comments: assist to initiate and continue logroll to R; able to come to 3/4th upright posture (unable to continue with 1 assist d/t assist levels required)  Transfers                 General transfer comment: Not appropriate at this time d/t current assist levels required  Ambulation/Gait             General Gait Details: Not appropriate at this time d/t current assist levels required  Stairs             Wheelchair Mobility    Modified Rankin (Stroke Patients Only)       Balance                                             Pertinent Vitals/Pain Pain Assessment: 0-10 Pain Score: 9  Pain Location: B ankle pain at rest and L hip pain with movement Pain Descriptors / Indicators: Grimacing Pain Intervention(s): Limited activity within patient's tolerance;Monitored during session;Repositioned (Nursing notified of pt's c/o pain)  Vitals (HR and O2 on room air) stable and WFL throughout treatment session.    Home Living Family/patient expects to be discharged to:: Skilled nursing facility                 Additional Comments: Resident of [facility]    Prior Function Level of Independence: Needs assistance   Gait / Transfers Assistance Needed: Pt is supposed to call staff for assist with mobility (pt uses w/c but also walks up to 20 feet with rollator on good days with staff or PT).  ADL's / Homemaking Assistance Needed: Assist for dressing, bathing, meals, medications, cleaning.  Pt has had decreased use of R UE lately and uses R UE brace that holds utensils for feeding.        Hand Dominance        Extremity/Trunk Assessment  Upper Extremity Assessment Upper Extremity Assessment:  (decreased R UE hand grip compared to L; at least 3+/5 B UE elbow flexion/extension; B shoulder flexion AROM at least to 90 degrees)    Lower Extremity Assessment Lower Extremity Assessment: Generalized weakness       Communication   Communication: No difficulties  Cognition Arousal/Alertness: Lethargic Behavior During Therapy: Flat affect Overall Cognitive Status: Impaired/Different from baseline (Oriented to person)                                        General Comments General comments (skin integrity, edema, etc.): Rash noted all over pt's body (pt's daughter reports she has had this for past month and was told it was "fungal" related).   Nursing cleared pt for participation in physical therapy.  Pt and pt's daughter agreeable to PT session.    Exercises     Assessment/Plan    PT Assessment Patient needs continued PT services  PT Problem List Decreased strength;Decreased activity tolerance;Decreased balance;Decreased mobility;Decreased cognition;Pain       PT Treatment Interventions DME instruction;Gait training;Functional mobility training;Therapeutic activities;Therapeutic exercise;Balance training;Patient/family education    PT Goals (Current goals can be found in the Care Plan section)  Acute Rehab PT Goals Patient Stated Goal: to improve mobility PT Goal Formulation: With family Time For Goal Achievement: 07/02/17 Potential to Achieve Goals: Fair    Frequency Min 2X/week   Barriers to discharge   Possibly level of assist    Co-evaluation               AM-PAC PT "6 Clicks" Daily Activity  Outcome Measure Difficulty turning over in bed (including adjusting bedclothes, sheets and blankets)?: Unable Difficulty moving from lying on back to sitting on the side of the bed? : Unable Difficulty sitting down on and standing up from a chair with arms (e.g., wheelchair, bedside commode, etc,.)?: Unable Help needed moving to and from a bed to chair (including a wheelchair)?: Total Help needed walking in hospital room?: Total Help needed climbing 3-5 steps with a railing? : Total 6 Click Score: 6    End of Session   Activity Tolerance: Patient limited by fatigue;Patient limited by lethargy;Patient limited by pain Patient left: in bed;with call bell/phone within reach;with family/visitor present (Bed alarm off upon PT arrival and pt's daughter reports she was told she could have it off while she was in room (nursing tech in room and aware)) Nurse Communication: Mobility status;Precautions (Pt's pain status) PT Visit Diagnosis: Other abnormalities of gait and mobility (R26.89);Muscle weakness (generalized)  (M62.81)    Time: 4098-1191 PT Time Calculation (min) (ACUTE ONLY): 28 min   Charges:   PT Evaluation $PT Eval Low Complexity: 1 Low     PT G Codes:   PT G-Codes **NOT FOR INPATIENT CLASS** Functional Assessment Tool Used: AM-PAC 6 Clicks Basic Mobility Functional Limitation: Mobility: Walking and moving around Mobility: Walking and Moving Around Current Status (Y7829): 100 percent impaired, limited or restricted Mobility: Walking and Moving Around Goal Status (F6213): At least 40 percent but less than 60 percent impaired, limited or restricted    Hendricks Limes, PT 06/18/17, 9:44 AM [phone]

## 2017-06-18 NOTE — Consult Note (Signed)
Reason for Consult:confusion  Referring Physician: Dr. Judithann Sheen   CC: Confusion   HPI: Maureen Taylor is an 82 y.o. female has a past medical history significant for Parkinson's, HTN, Bipolar D/O, and recurrent UTI's who presents from SNF with unresponsiveness and lethargy. Found to be hypotensive with elevated lactic acid in ER with repeat UTI, c/w SIRS and possible sepsis. Overnight pt was started on fluids and Rocephin. Today much improved.    Past Medical History:  Diagnosis Date  . Acute encephalopathy   . Allergic rhinitis   . Ascorbic acid deficiency   . Bipolar affective disorder (HCC)   . Candidiasis   . Charcot's joint   . Chronic constipation   . Dysuria   . GERD (gastroesophageal reflux disease)   . History of falling   . HLD (hyperlipidemia)   . HTN (hypertension)   . Magnesium deficiency   . Major depressive disorder   . Mixed incontinence   . Morbid obesity (HCC)   . Muscle weakness   . Neuropathic pain   . Neuropathy   . OAB (overactive bladder)   . Obesity   . Oral pain   . Parkinson disease (HCC)   . Pneumonia   . Protein calorie malnutrition (HCC)   . Rosacea   . Sepsis (HCC) 09/2016  . Slow transit constipation   . Type 2 diabetes mellitus with diabetic polyneuropathy, with long-term current use of insulin (HCC)   . Urinary tract infection without hematuria   . Vaginal atrophy   . Vitamin deficiency     Past Surgical History:  Procedure Laterality Date  . ABDOMINAL HYSTERECTOMY    . CERVICAL CONIZATION W/BX    . CHOLECYSTECTOMY    . REVISION TOTAL KNEE ARTHROPLASTY Right     Family History  Problem Relation Age of Onset  . Colon cancer Father   . Colon cancer Sister   . Stomach cancer Sister   . Rectal cancer Sister   . Kidney disease Neg Hx   . Bladder Cancer Neg Hx   . Prostate cancer Neg Hx     Social History:  reports that she has never smoked. She has never used smokeless tobacco. She reports that she does not drink alcohol or use  drugs.  Allergies  Allergen Reactions  . Codeine Itching  . Morphine And Related Itching  . Indocin [Indomethacin] Rash    Medications: I have reviewed the patient's current medications.  ROS: Unable to obtain due to confusion   Physical Examination: Blood pressure (!) 134/54, pulse 97, temperature 98 F (36.7 C), temperature source Oral, resp. rate 18, height  (1.651 m), weight 108.8 kg (239 lb 12.8 oz), SpO2 94 %.   Neurological Examination   Mental Status: Alert to name only Cranial Nerves: II: Discs flat bilaterally; Visual fields grossly normal, pupils equal, round, reactive to light and accommodation III,IV, VI: ptosis not present, extra-ocular motions intact bilaterally V,VII: smile symmetric, facial light touch sensation normal bilaterally VIII: hearing normal bilaterally IX,X: gag reflex present XI: bilateral shoulder shrug XII: midline tongue extension Motor: Right : Upper extremity   4/5    Left:     Upper extremity   4/5  Lower extremity   4/5     Lower extremity   4/5 Tone and bulk:normal tone throughout; no atrophy noted Sensory: Pinprick and light touch intact throughout, bilaterally Deep Tendon Reflexes: 1+ and symmetric throughout Plantars: Right: downgoing   Left: downgoing Cerebellar: Not tested Gait: not tested  Laboratory Studies:   Basic Metabolic Panel:  Recent Labs Lab 06/17/17 1200 06/18/17 0343  NA 142 144  K 4.6 4.3  CL 108 114*  CO2 24 25  GLUCOSE 91 153*  BUN 24* 21*  CREATININE 1.44* 1.20*  CALCIUM 8.3* 7.9*    Liver Function Tests:  Recent Labs Lab 06/17/17 1200 06/18/17 0343  AST 35 35  ALT 6* 5*  ALKPHOS 114 99  BILITOT 0.7 0.4  PROT 6.9 5.8*  ALBUMIN 2.9* 2.4*   No results for input(s): LIPASE, AMYLASE in the last 168 hours. No results for input(s): AMMONIA in the last 168 hours.  CBC:  Recent Labs Lab 06/17/17 1200 06/18/17 0343  WBC 12.8* 11.3*  NEUTROABS 8.8*  --   HGB 11.1* 9.7*  HCT  33.8* 29.3*  MCV 89.6 90.0  PLT 345 279    Cardiac Enzymes: No results for input(s): CKTOTAL, CKMB, CKMBINDEX, TROPONINI in the last 168 hours.  BNP: Invalid input(s): POCBNP  CBG:  Recent Labs Lab 06/18/17 0429 06/18/17 0705 06/18/17 0809 06/18/17 1004 06/18/17 1211  GLUCAP 131* 133* 114* 119* 173*    Microbiology: Results for orders placed or performed during the hospital encounter of 06/17/17  Blood culture (routine x 2)     Status: None (Preliminary result)   Collection Time: 06/17/17 12:10 PM  Result Value Ref Range Status   Specimen Description BLOOD RT HA  Final   Special Requests   Final    BOTTLES DRAWN AEROBIC AND ANAEROBIC Blood Culture results may not be optimal due to an inadequate volume of blood received in culture bottles   Culture NO GROWTH < 24 HOURS  Final   Report Status PENDING  Incomplete  Blood culture (routine x 2)     Status: None (Preliminary result)   Collection Time: 06/17/17  2:17 PM  Result Value Ref Range Status   Specimen Description BLOOD LT HAND  Final   Special Requests   Final    BOTTLES DRAWN AEROBIC AND ANAEROBIC Blood Culture adequate volume   Culture NO GROWTH < 24 HOURS  Final   Report Status PENDING  Incomplete  MRSA PCR Screening     Status: Abnormal   Collection Time: 06/17/17  6:15 PM  Result Value Ref Range Status   MRSA by PCR POSITIVE (A) NEGATIVE Final    Comment:        The GeneXpert MRSA Assay (FDA approved for NASAL specimens only), is one component of a comprehensive MRSA colonization surveillance program. It is not intended to diagnose MRSA infection nor to guide or monitor treatment for MRSA infections. RESULT CALLED TO, READ BACK BY AND VERIFIED WITH: CALLED KELLY ZIELKE AT 1945 ON 06/17/17 RWW     Coagulation Studies: No results for input(s): LABPROT, INR in the last 72 hours.  Urinalysis:  Recent Labs Lab 06/17/17 1213  COLORURINE YELLOW*  LABSPEC 1.016  PHURINE 5.0  GLUCOSEU NEGATIVE   HGBUR NEGATIVE  BILIRUBINUR NEGATIVE  KETONESUR NEGATIVE  PROTEINUR 100*  NITRITE NEGATIVE  LEUKOCYTESUR LARGE*    Lipid Panel:     Component Value Date/Time   CHOL 142 07/04/2016   TRIG 176 (A) 07/04/2016   HDL 35 07/04/2016   LDLCALC 72 07/04/2016    HgbA1C:  Lab Results  Component Value Date   HGBA1C 9.2 (H) 06/17/2017    Urine Drug Screen:     Component Value Date/Time   LABOPIA NONE DETECTED 06/17/2017 1222   COCAINSCRNUR NONE DETECTED 06/17/2017 1222   LABBENZ  NONE DETECTED 06/17/2017 1222   AMPHETMU NONE DETECTED 06/17/2017 1222   THCU NONE DETECTED 06/17/2017 1222   LABBARB NONE DETECTED 06/17/2017 1222    Alcohol Level:  Recent Labs Lab 06/17/17 1210  ETH <5     Imaging: Dg Chest Portable 1 View  Result Date: 06/17/2017 CLINICAL DATA:  Altered mental status and UTI EXAM: PORTABLE CHEST 1 VIEW COMPARISON:  01/11/2017 FINDINGS: Cardiac shadow is mildly enlarged. The lungs are well aerated bilaterally. No focal infiltrate or sizable effusion is seen. No bony abnormality is noted. IMPRESSION: No active disease. Electronically Signed   By: Alcide Clever M.D.   On: 06/17/2017 12:44   Dg Abd 2 Views  Result Date: 06/17/2017 CLINICAL DATA:  Abdominal pain. EXAM: ABDOMEN - 2 VIEW COMPARISON:  10/08/2016 FINDINGS: No evidence for free air. Surgical clips in the right upper abdomen. Right hip replacement. Gas in the small and large bowel. Multilevel degenerative disease in the thoracic and lumbar spine. IMPRESSION: No acute abnormality.  Nonobstructive bowel gas pattern. Electronically Signed   By: Richarda Overlie M.D.   On: 06/17/2017 15:18     Assessment/Plan:  82 y.o. female has a past medical history significant for Parkinson's, HTN, Bipolar D/O, and recurrent UTI's who presents from SNF with unresponsiveness and lethargy. Found to be hypotensive with elevated lactic acid in ER with repeat UTI, c/w SIRS and possible sepsis. Overnight pt was started on fluids and  Rocephin. Today much improved.    - Pt has hx of Parkinsons dz which was diagnosed 5 yrs ago and dos not appear to have progressed - Con't antibiotics and hydration - No change in management of Parkinson's dz  - out pt neurology folllow up 06/18/2017, 1:14 PM

## 2017-06-18 NOTE — Consult Note (Signed)
WOC Nurse wound consult note Reason for Consult: dry, flaking skin with intertriginous dermatitis in the inframammary, subpannicular and bilateral inguinal areas. Inguinal and inframammary appear to have fungal component (confluent red center, satellite lesions). Body worn absorbent garment (brief) is being changed once daily and is resulting  Wound type:Moisture, other disease process Pressure Injury POA: N/A Measurement: N/A Wound bed: N/A Drainage (amount, consistency, odor) scant serous in the inframammary and bilateral inguinal areas.   Periwound: very dry, flaking Dressing procedure/placement/frequency: Skin fold moisture and fungal overgrowth has been addressed with antifungal powder for two days, I will discontinue this in favor of our house moisture wicking textile, Interdry Ag+.  For your consideration, several doses of oral or IV antifungal (eg., diflucan) would provide more expedient resolution of the overgrowth. If you agree, please order. It is noted that consideration of a post discharge referral to a dermatologist has been suggested.  In the interim, I have provided Nursing with guidance to moisturize extremities with Eucerin cream pending other provider instructions. WOC nursing team will not follow, but will remain available to this patient, the nursing and medical teams.  Please re-consult if needed. Thanks, Ladona Mow, MSN, RN, GNP, [facility]  Pager# [phone]

## 2017-06-19 ENCOUNTER — Telehealth: Payer: Self-pay | Admitting: Urology

## 2017-06-19 LAB — CBC
HCT: 32.4 % — ABNORMAL LOW (ref 35.0–47.0)
Hemoglobin: 10.9 g/dL — ABNORMAL LOW (ref 12.0–16.0)
MCH: 29.9 pg (ref 26.0–34.0)
MCHC: 33.6 g/dL (ref 32.0–36.0)
MCV: 88.9 fL (ref 80.0–100.0)
PLATELETS: 348 10*3/uL (ref 150–440)
RBC: 3.65 MIL/uL — AB (ref 3.80–5.20)
RDW: 14 % (ref 11.5–14.5)
WBC: 15.8 10*3/uL — AB (ref 3.6–11.0)

## 2017-06-19 LAB — GLUCOSE, CAPILLARY
GLUCOSE-CAPILLARY: 199 mg/dL — AB (ref 65–99)
GLUCOSE-CAPILLARY: 239 mg/dL — AB (ref 65–99)
GLUCOSE-CAPILLARY: 274 mg/dL — AB (ref 65–99)
GLUCOSE-CAPILLARY: 296 mg/dL — AB (ref 65–99)

## 2017-06-19 LAB — BASIC METABOLIC PANEL
ANION GAP: 9 (ref 5–15)
BUN: 17 mg/dL (ref 6–20)
CO2: 22 mmol/L (ref 22–32)
Calcium: 8 mg/dL — ABNORMAL LOW (ref 8.9–10.3)
Chloride: 109 mmol/L (ref 101–111)
Creatinine, Ser: 1.28 mg/dL — ABNORMAL HIGH (ref 0.44–1.00)
GFR, EST AFRICAN AMERICAN: 45 mL/min — AB (ref >60)
GFR, EST NON AFRICAN AMERICAN: 39 mL/min — AB (ref >60)
GLUCOSE: 214 mg/dL — AB (ref 65–99)
POTASSIUM: 4.5 mmol/L (ref 3.5–5.1)
SODIUM: 140 mmol/L (ref 135–145)

## 2017-06-19 MED ORDER — IPRATROPIUM-ALBUTEROL 0.5-2.5 (3) MG/3ML IN SOLN
3.0000 mL | RESPIRATORY_TRACT | Status: DC
  Administered 2017-06-19 – 2017-06-20 (×6): 3 mL via RESPIRATORY_TRACT
  Filled 2017-06-19 (×6): qty 3

## 2017-06-19 NOTE — Telephone Encounter (Signed)
App made 

## 2017-06-20 ENCOUNTER — Inpatient Hospital Stay: Payer: Medicare Other

## 2017-06-20 DIAGNOSIS — L8990 Pressure ulcer of unspecified site, unspecified stage: Secondary | ICD-10-CM | POA: Insufficient documentation

## 2017-06-20 LAB — URINALYSIS, COMPLETE (UACMP) WITH MICROSCOPIC
Bacteria, UA: NONE SEEN
Bilirubin Urine: NEGATIVE
GLUCOSE, UA: NEGATIVE mg/dL
HGB URINE DIPSTICK: NEGATIVE
KETONES UR: NEGATIVE mg/dL
NITRITE: NEGATIVE
PH: 5 (ref 5.0–8.0)
Protein, ur: 100 mg/dL — AB
Specific Gravity, Urine: 1.019 (ref 1.005–1.030)

## 2017-06-20 LAB — BASIC METABOLIC PANEL
ANION GAP: 6 (ref 5–15)
BUN: 16 mg/dL (ref 6–20)
CALCIUM: 8 mg/dL — AB (ref 8.9–10.3)
CO2: 23 mmol/L (ref 22–32)
Chloride: 111 mmol/L (ref 101–111)
Creatinine, Ser: 1.06 mg/dL — ABNORMAL HIGH (ref 0.44–1.00)
GFR calc Af Amer: 56 mL/min — ABNORMAL LOW (ref >60)
GFR, EST NON AFRICAN AMERICAN: 49 mL/min — AB (ref >60)
GLUCOSE: 190 mg/dL — AB (ref 65–99)
POTASSIUM: 3.8 mmol/L (ref 3.5–5.1)
SODIUM: 140 mmol/L (ref 135–145)

## 2017-06-20 LAB — CBC
HCT: 29 % — ABNORMAL LOW (ref 35.0–47.0)
Hemoglobin: 9.7 g/dL — ABNORMAL LOW (ref 12.0–16.0)
MCH: 29.6 pg (ref 26.0–34.0)
MCHC: 33.3 g/dL (ref 32.0–36.0)
MCV: 88.8 fL (ref 80.0–100.0)
PLATELETS: 323 10*3/uL (ref 150–440)
RBC: 3.26 MIL/uL — AB (ref 3.80–5.20)
RDW: 13.8 % (ref 11.5–14.5)
WBC: 16.7 10*3/uL — AB (ref 3.6–11.0)

## 2017-06-20 LAB — GLUCOSE, CAPILLARY
GLUCOSE-CAPILLARY: 141 mg/dL — AB (ref 65–99)
GLUCOSE-CAPILLARY: 157 mg/dL — AB (ref 65–99)
GLUCOSE-CAPILLARY: 160 mg/dL — AB (ref 65–99)
GLUCOSE-CAPILLARY: 165 mg/dL — AB (ref 65–99)
Glucose-Capillary: 183 mg/dL — ABNORMAL HIGH (ref 65–99)

## 2017-06-20 MED ORDER — VANCOMYCIN HCL 10 G IV SOLR
1250.0000 mg | INTRAVENOUS | Status: DC
  Administered 2017-06-21 – 2017-06-23 (×3): 1250 mg via INTRAVENOUS
  Filled 2017-06-20 (×4): qty 1250

## 2017-06-20 MED ORDER — INSULIN ASPART 100 UNIT/ML ~~LOC~~ SOLN
4.0000 [IU] | Freq: Three times a day (TID) | SUBCUTANEOUS | Status: DC
  Administered 2017-06-22 – 2017-06-24 (×8): 4 [IU] via SUBCUTANEOUS
  Filled 2017-06-20 (×10): qty 1

## 2017-06-20 MED ORDER — HALOPERIDOL LACTATE 5 MG/ML IJ SOLN
5.0000 mg | Freq: Once | INTRAMUSCULAR | Status: AC
  Administered 2017-06-20: 5 mg via INTRAVENOUS
  Filled 2017-06-20: qty 1

## 2017-06-20 MED ORDER — VANCOMYCIN HCL 10 G IV SOLR
1500.0000 mg | Freq: Once | INTRAVENOUS | Status: AC
  Administered 2017-06-20: 1500 mg via INTRAVENOUS
  Filled 2017-06-20: qty 1500

## 2017-06-20 MED ORDER — IPRATROPIUM-ALBUTEROL 0.5-2.5 (3) MG/3ML IN SOLN
3.0000 mL | Freq: Two times a day (BID) | RESPIRATORY_TRACT | Status: DC
  Administered 2017-06-20 – 2017-06-24 (×8): 3 mL via RESPIRATORY_TRACT
  Filled 2017-06-20 (×9): qty 3

## 2017-06-20 MED ORDER — DEXTROSE 5 % IV SOLN
1.0000 g | INTRAVENOUS | Status: DC
  Administered 2017-06-21 – 2017-06-24 (×4): 1 g via INTRAVENOUS
  Filled 2017-06-20 (×4): qty 10

## 2017-06-20 MED ORDER — VANCOMYCIN HCL 10 G IV SOLR: 1500.0000 mg | INTRAVENOUS | Status: DC

## 2017-06-20 MED ORDER — OCUVITE-LUTEIN PO CAPS
1.0000 | ORAL_CAPSULE | Freq: Every day | ORAL | Status: DC
  Administered 2017-06-20 – 2017-06-24 (×4): 1 via ORAL
  Filled 2017-06-20 (×5): qty 1

## 2017-06-20 MED ORDER — CLOTRIMAZOLE 1 % EX CREA
TOPICAL_CREAM | Freq: Two times a day (BID) | CUTANEOUS | Status: DC
  Administered 2017-06-20 – 2017-06-24 (×6): via TOPICAL
  Filled 2017-06-20: qty 15

## 2017-06-20 NOTE — Progress Notes (Signed)
[facility] [place] at [facility]   PATIENT NAME: Maureen Taylor    MR#:  [id number]  DATE OF BIRTH:  [birth year]  SUBJECTIVE:  CHIEF COMPLAINT:   Chief Complaint  Patient presents with  . Altered Mental Status    Have parkinson's dz, lives in NH- no bladder control for last >  1 year. Sent with sepsis and UTI. More alert today. Still confused as per her daughter in room.  REVIEW OF SYSTEMS:   Pt is not able to give much details.  ROS  DRUG ALLERGIES:   Allergies  Allergen Reactions  . Codeine Itching  . Morphine And Related Itching  . Indocin [Indomethacin] Rash    VITALS:  Blood pressure (!) 97/41, pulse 92, temperature 98.2 F (36.8 C), temperature source Axillary, resp. rate 20, height  (1.651 m), weight 111.2 kg (245 lb 1.6 oz), SpO2 97 %.  PHYSICAL EXAMINATION:  GENERAL:  82 y.o.-year-old patient lying in the bed with no acute distress.  EYES: Pupils equal, round, reactive to light and accommodation. No scleral icterus. Extraocular muscles intact.  HEENT: Head atraumatic, normocephalic. Oropharynx and nasopharynx clear.  NECK:  Supple, no jugular venous distention. No thyroid enlargement, no tenderness.  LUNGS: Normal breath sounds bilaterally, no wheezing, rales,rhonchi or crepitation. No use of accessory muscles of respiration.  CARDIOVASCULAR: S1, S2 normal. No murmurs, rubs, or gallops.  ABDOMEN: Soft, nontender, nondistended. Bowel sounds present. No organomegaly or mass.  EXTREMITIES: both legs excessive skin flaking and redness. No edema. NEUROLOGIC: Cranial nerves II through XII are intact. Muscle strength 2-3/5 in all extremities. Sensation intact. Gait not checked.  PSYCHIATRIC: The patient is alert and oriented x 1.  SKIN: all over the body- redness and fine flaking present.   Physical Exam LABORATORY PANEL:   CBC  Recent Labs Lab 06/20/17 0330  WBC 16.7*  HGB 9.7*  HCT 29.0*  PLT 323    ------------------------------------------------------------------------------------------------------------------  Chemistries   Recent Labs Lab 06/18/17 0343  06/20/17 0330  NA 144  < > 140  K 4.3  < > 3.8  CL 114*  < > 111  CO2 25  < > 23  GLUCOSE 153*  < > 190*  BUN 21*  < > 16  CREATININE 1.20*  < > 1.06*  CALCIUM 7.9*  < > 8.0*  AST 35  --   --   ALT 5*  --   --   ALKPHOS 99  --   --   BILITOT 0.4  --   --   < > = values in this interval not displayed. ------------------------------------------------------------------------------------------------------------------  Cardiac Enzymes No results for input(s): TROPONINI in the last 168 hours. ------------------------------------------------------------------------------------------------------------------  RADIOLOGY:  Dg Chest 2 View  Result Date: 06/20/2017 CLINICAL DATA:  Hypoxia, altered mental status, Parkinson's disease EXAM: CHEST  2 VIEW COMPARISON:  Chest x-ray of 06/17/2017 FINDINGS: There is opacity at both lung bases on the lateral view which may indicate effusions as well as volume loss. There is mild to moderate cardiomegaly present and mild pulmonary vascular congestion may be present as well. These findings may indicate mild CHF. No bony abnormality is seen. Old right mid clavicular fracture is noted. IMPRESSION: Cardiomegaly with apparent bilateral pleuro effusions and mild congestion may indicate mild CHF. Electronically Signed   By: Dwyane Dee M.D.   On: 06/20/2017 09:43    ASSESSMENT AND PLAN:   Principal Problem:   SIRS (systemic inflammatory response syndrome) (HCC) Active Problems:   Parkinson's disease (  HCC)   UTI (urinary tract infection)   Acute encephalopathy   Pressure injury of skin  * Sepsis, Acute metabolic encephalopathy   UTI     IV Abx, cx sent- report multiple species.   Appreciated help of Urology   [place] and may need proper treatment of constipation.   As WBCs keep rising and  pt remains confused, Called ID consult, he suggest to send Ur cx again from [place].  * Cellulitis?    With her skin changes and elevating WBCs, I am not very sure, but suspect cellulitis on legs.    MRSA positive, so give Vanc Iv.    Spoke to ID to evaluate.  * DM   COnt Lantus and ISS  * Hypertension    Cont metoprolol.  * Skin disease   For last few weeks, have skin redness and flaking as per daughter.    She is advised to go to Dermatologist for biopsy asa per daughter.    I will advise to cont with the plan.  * Parkinson's disease   Cont home meds  * Wheezing   Added duoneb.   Xray chest clear.  All the records are reviewed and case discussed with Care Management/Social Workerr. Management plans discussed with the patient, family and they are in agreement.  CODE STATUS: Full.  TOTAL TIME TAKING CARE OF THIS PATIENT: 35 minutes.    POSSIBLE D/C IN 1-2 DAYS, DEPENDING ON CLINICAL CONDITION.   Altamese Dilling M.D on 06/20/2017   Between 7am to 6pm - Pager - [phone]  After 6pm go to [email or web address] - password [email or web address]  [facility]  Office  [phone]  CC: Primary care physician; Oneal Grout, MD  Note: This dictation was prepared with Dragon dictation along with smaller phrase technology. Any transcriptional errors that result from this process are unintentional.

## 2017-06-20 NOTE — Progress Notes (Signed)
Physical Therapy Treatment Patient Details Name: Maureen Taylor MRN: [id number] DOB: [birth year] Today's Date: 06/20/2017    History of Present Illness Pt is a 82 y.o. female presenting to hospital with AMS, lethargy, and decreased responsiveness.  Pt admitted with SIRS.  PMH includes: Parkinson's disease, DM, htn, recurrent UTI, bipolar disorder.    PT Comments    Participated in exercises as described below.  Pt requires max a x 2 to sit edge of bed.  Once sitting she is able to remains sitting with min assist for 4 minutes.  Attempts at standing deferred today due to assistance levels and pt safety.  Daughter in for session.     Follow Up Recommendations  SNF     Equipment Recommendations  Rolling walker with 5" wheels    Recommendations for Other Services       Precautions / Restrictions Precautions Precautions: Fall Restrictions Weight Bearing Restrictions: No    Mobility  Bed Mobility Overal bed mobility: Needs Assistance Bed Mobility: Supine to Sit;Sit to Supine Rolling: Max assist;+2 for physical assistance   Supine to sit: Max assist;+2 for physical assistance;HOB elevated Sit to supine: Max assist;+2 for physical assistance      Transfers                 General transfer comment: Not appropriate at this time d/t current assist levels required  Ambulation/Gait                 Stairs            Wheelchair Mobility    Modified Rankin (Stroke Patients Only)       Balance Overall balance assessment: Needs assistance Sitting-balance support: Feet supported;Bilateral upper extremity supported Sitting balance-Leahy Scale: Poor                                      Cognition Arousal/Alertness: Awake/alert Behavior During Therapy: WFL for tasks assessed/performed Overall Cognitive Status: Impaired/Different from baseline                                        Exercises Other Exercises Other  Exercises: BLE AAROM for ankle pumps, heel slides, ALR and ab/add x 10    General Comments        Pertinent Vitals/Pain Pain Assessment: Faces Faces Pain Scale: Hurts little more Pain Location: bilateral feet and back Pain Descriptors / Indicators: Grimacing Pain Intervention(s): Limited activity within patient's tolerance;Monitored during session    Home Living                      Prior Function            PT Goals (current goals can now be found in the care plan section) Progress towards PT goals: Progressing toward goals    Frequency    Min 2X/week      PT Plan Current plan remains appropriate    Co-evaluation              AM-PAC PT "6 Clicks" Daily Activity  Outcome Measure  Difficulty turning over in bed (including adjusting bedclothes, sheets and blankets)?: Unable Difficulty moving from lying on back to sitting on the side of the bed? : Unable Difficulty sitting down on and standing up from a chair with arms (e.g.,  wheelchair, bedside commode, etc,.)?: Unable Help needed moving to and from a bed to chair (including a wheelchair)?: Total Help needed walking in hospital room?: Total Help needed climbing 3-5 steps with a railing? : Total 6 Click Score: 6    End of Session Equipment Utilized During Treatment: Oxygen Activity Tolerance: Patient limited by fatigue Patient left: in bed;with bed alarm set;with nursing/sitter in room;with call bell/phone within reach;with family/visitor present Nurse Communication: Mobility status       Time: 0942-1000 PT Time Calculation (min) (ACUTE ONLY): 18 min  Charges:  $Therapeutic Activity: 8-22 mins                    G Codes:       Danielle Dess, PTA 06/20/17, 10:47 AM

## 2017-06-20 NOTE — Care Management Important Message (Signed)
Important Message  Patient Details  Name: Maureen Taylor MRN: [id number] Date of Birth: [birth year]   Medicare Important Message Given:  Yes    Chapman Fitch, RN 06/20/2017, 3:11 PM

## 2017-06-20 NOTE — Consult Note (Signed)
[facility] Infectious Disease     Reason for Consult:UTI, Rash    Referring Physician: Dolores Frame Date of Admission:  06/17/2017   Principal Problem:   SIRS (systemic inflammatory response syndrome) ([place]) Active Problems:   Parkinson's disease ([place])   UTI (urinary tract infection)   Acute encephalopathy   Pressure injury of skin   HPI: Maureen Taylor is a 82 y.o. female with dementia and chronic skin rash. She is admitted now with AMS. On admit wbc was q3 and no fevers but now wbc up to 17 and low grade temps. Was on ceftriaxone. Has worsening wound on R ankle. Foley placed by urology.     Past Medical History:  Diagnosis Date  . Acute encephalopathy   . Allergic rhinitis   . Ascorbic acid deficiency   . Bipolar affective disorder ([place])   . Candidiasis   . Charcot's joint   . Chronic constipation   . Dysuria   . GERD (gastroesophageal reflux disease)   . History of falling   . HLD (hyperlipidemia)   . HTN (hypertension)   . Magnesium deficiency   . Major depressive disorder   . Mixed incontinence   . Morbid obesity ([place])   . Muscle weakness   . Neuropathic pain   . Neuropathy   . OAB (overactive bladder)   . Obesity   . Oral pain   . Parkinson disease ([place])   . Pneumonia   . Protein calorie malnutrition ([place])   . Rosacea   . Sepsis ([place]) 09/2016  . Slow transit constipation   . Type 2 diabetes mellitus with diabetic polyneuropathy, with long-term current use of insulin ([place])   . Urinary tract infection without hematuria   . Vaginal atrophy   . Vitamin deficiency    Past Surgical History:  Procedure Laterality Date  . ABDOMINAL HYSTERECTOMY    . CERVICAL CONIZATION W/BX    . CHOLECYSTECTOMY    . REVISION TOTAL KNEE ARTHROPLASTY Right    Social History  Substance Use Topics  . Smoking status: Never Smoker  . Smokeless tobacco: Never Used  . Alcohol use No   Family History  Problem Relation Age of Onset  . Colon cancer Father   . Colon cancer  Sister   . Stomach cancer Sister   . Rectal cancer Sister   . Kidney disease Neg Hx   . Bladder Cancer Neg Hx   . Prostate cancer Neg Hx     Allergies:  Allergies  Allergen Reactions  . Codeine Itching  . Morphine And Related Itching  . Indocin [Indomethacin] Rash    Current antibiotics: Antibiotics Given (last 72 hours)    Date/Time Action Medication Dose Rate   06/18/17 0523 Given   cefTRIAXone (ROCEPHIN) IVPB 1 g 1 g 100 mL/hr   06/19/17 1122 New Bag/Given  [Night Shift RN rescheduled to be given at 1000. Pt loss IV access at 0735 this A.M. during bedside reporting.]   cefTRIAXone (ROCEPHIN) 1 g in dextrose 5 % 50 mL IVPB 1 g 100 mL/hr   06/20/17 0643 New Bag/Given   cefTRIAXone (ROCEPHIN) 1 g in dextrose 5 % 50 mL IVPB 1 g 100 mL/hr      MEDICATIONS: . ARIPiprazole  2.5 mg Oral BID  . aspirin EC  81 mg Oral Daily  . atorvastatin  20 mg Oral Daily  . carbidopa-levodopa  1 tablet Oral BID  . Chlorhexidine Gluconate Cloth  6 each Topical Q0600  . docusate sodium  100  mg Oral BID  . DULoxetine  60 mg Oral Daily  . enoxaparin (LOVENOX) injection  40 mg Subcutaneous Q24H  . famotidine  20 mg Oral BID  . gabapentin  200 mg Oral QHS  . hydrocerin   Topical BID  . insulin aspart  0-9 Units Subcutaneous TID WC  . insulin aspart  4 Units Subcutaneous TID WC  . insulin glargine  30 Units Subcutaneous BID  . ipratropium-albuterol  3 mL Nebulization Q4H  . lamoTRIgine  100 mg Oral BID  . linaclotide  145 mcg Oral QAC breakfast  . lisinopril  20 mg Oral Daily  . mouth rinse  15 mL Mouth Rinse BID  . metoprolol tartrate  25 mg Oral BID  . metroNIDAZOLE   Topical Once per day on Mon Wed Fri  . multivitamin-lutein  1 capsule Oral Daily  . mupirocin ointment  1 application Nasal BID  . oxybutynin  5 mg Oral BID  . pantoprazole  40 mg Oral BID    Review of Systems -unable to obtain  OBJECTIVE: Temp:  [97.6 F (36.4 C)-99.1 F (37.3 C)] 98.2 F (36.8 C) (09/25  1340) Pulse Rate:  [92-104] 92 (09/25 1340) Resp:  [20-24] 20 (09/25 1006) BP: (97-118)/(41-61) 97/41 (09/25 1340) SpO2:  [89 %-100 %] 97 % (09/25 1340) Weight:  [111.2 kg (245 lb 1.6 oz)] 111.2 kg (245 lb 1.6 oz) (09/25 0501) Physical Exam  Constitutional: demeted, lying in bed HENT: [place]/AT, PERRLA, no scleral icterus Mouth/Throat: Oropharynx is clear and moist. No oropharyngeal exudate.  Cardiovascular: Normal rate, regular rhythm and normal heart sounds. \ Pulmonary/Chest: Effort normal and breath sounds normal. No respiratory distress.  has no wheezes.  Neck = supple, no nuchal rigidity Abdominal: Soft. Bowel sounds are normal.  exhibits no distension. There is no tenderness.  Lymphadenopathy: no cervical adenopathy. No axillary adenopathy Neurological: lethragic, Skin: diffuse scaly eruption R heel ulcer dendued blister Psychiatric: unable to assess  LABS: Results for orders placed or performed during the hospital encounter of 06/17/17 (from the past 48 hour(s))  Glucose, capillary     Status: Abnormal   Collection Time: 06/18/17  4:54 PM  Result Value Ref Range   Glucose-Capillary 194 (H) 65 - 99 mg/dL   Comment 1 Notify RN   Glucose, capillary     Status: Abnormal   Collection Time: 06/18/17  9:27 PM  Result Value Ref Range   Glucose-Capillary 232 (H) 65 - 99 mg/dL  CBC     Status: Abnormal   Collection Time: 06/19/17  3:33 AM  Result Value Ref Range   WBC 15.8 (H) 3.6 - 11.0 K/uL   RBC 3.65 (L) 3.80 - 5.20 MIL/uL   Hemoglobin 10.9 (L) 12.0 - 16.0 g/dL   HCT 32.4 (L) 35.0 - 47.0 %   MCV 88.9 80.0 - 100.0 fL   MCH 29.9 26.0 - 34.0 pg   MCHC 33.6 32.0 - 36.0 g/dL   RDW 14.0 11.5 - 14.5 %   Platelets 348 150 - 440 K/uL  Basic metabolic panel     Status: Abnormal   Collection Time: 06/19/17  3:33 AM  Result Value Ref Range   Sodium 140 135 - 145 mmol/L   Potassium 4.5 3.5 - 5.1 mmol/L   Chloride 109 101 - 111 mmol/L   CO2 22 22 - 32 mmol/L   Glucose, Bld 214 (H) 65  - 99 mg/dL   BUN 17 6 - 20 mg/dL   Creatinine, Ser 1.28 (H) 0.44 -  1.00 mg/dL   Calcium 8.0 (L) 8.9 - 10.3 mg/dL   GFR calc non Af Amer 39 (L) >60 mL/min   GFR calc Af Amer 45 (L) >60 mL/min    Comment: (NOTE) The eGFR has been calculated using the CKD EPI equation. This calculation has not been validated in all clinical situations. eGFR's persistently <60 mL/min signify possible Chronic Kidney Disease.    Anion gap 9 5 - 15  Glucose, capillary     Status: Abnormal   Collection Time: 06/19/17  7:48 AM  Result Value Ref Range   Glucose-Capillary 199 (H) 65 - 99 mg/dL  Glucose, capillary     Status: Abnormal   Collection Time: 06/19/17 11:49 AM  Result Value Ref Range   Glucose-Capillary 239 (H) 65 - 99 mg/dL  Glucose, capillary     Status: Abnormal   Collection Time: 06/19/17  4:57 PM  Result Value Ref Range   Glucose-Capillary 274 (H) 65 - 99 mg/dL  Glucose, capillary     Status: Abnormal   Collection Time: 06/19/17 10:10 PM  Result Value Ref Range   Glucose-Capillary 296 (H) 65 - 99 mg/dL   Comment 1 Notify RN   CBC     Status: Abnormal   Collection Time: 06/20/17  3:30 AM  Result Value Ref Range   WBC 16.7 (H) 3.6 - 11.0 K/uL   RBC 3.26 (L) 3.80 - 5.20 MIL/uL   Hemoglobin 9.7 (L) 12.0 - 16.0 g/dL   HCT 29.0 (L) 35.0 - 47.0 %   MCV 88.8 80.0 - 100.0 fL   MCH 29.6 26.0 - 34.0 pg   MCHC 33.3 32.0 - 36.0 g/dL   RDW 13.8 11.5 - 14.5 %   Platelets 323 150 - 440 K/uL  Basic metabolic panel     Status: Abnormal   Collection Time: 06/20/17  3:30 AM  Result Value Ref Range   Sodium 140 135 - 145 mmol/L   Potassium 3.8 3.5 - 5.1 mmol/L   Chloride 111 101 - 111 mmol/L   CO2 23 22 - 32 mmol/L   Glucose, Bld 190 (H) 65 - 99 mg/dL   BUN 16 6 - 20 mg/dL   Creatinine, Ser 1.06 (H) 0.44 - 1.00 mg/dL   Calcium 8.0 (L) 8.9 - 10.3 mg/dL   GFR calc non Af Amer 49 (L) >60 mL/min   GFR calc Af Amer 56 (L) >60 mL/min    Comment: (NOTE) The eGFR has been calculated using the CKD EPI  equation. This calculation has not been validated in all clinical situations. eGFR's persistently <60 mL/min signify possible Chronic Kidney Disease.    Anion gap 6 5 - 15  Glucose, capillary     Status: Abnormal   Collection Time: 06/20/17  7:59 AM  Result Value Ref Range   Glucose-Capillary 157 (H) 65 - 99 mg/dL  Glucose, capillary     Status: Abnormal   Collection Time: 06/20/17 12:14 PM  Result Value Ref Range   Glucose-Capillary 183 (H) 65 - 99 mg/dL   Comment 1 Notify RN    No components found for: ESR, C REACTIVE PROTEIN MICRO: Recent Results (from the past 720 hour(s))  Blood culture (routine x 2)     Status: None (Preliminary result)   Collection Time: 06/17/17 12:10 PM  Result Value Ref Range Status   Specimen Description BLOOD RT HA  Final   Special Requests   Final    BOTTLES DRAWN AEROBIC AND ANAEROBIC Blood Culture results may not  be optimal due to an inadequate volume of blood received in culture bottles   Culture NO GROWTH 3 DAYS  Final   Report Status PENDING  Incomplete  Urine Culture     Status: Abnormal   Collection Time: 06/17/17 12:13 PM  Result Value Ref Range Status   Specimen Description URINE, RANDOM  Final   Special Requests NONE  Final   Culture MULTIPLE SPECIES PRESENT, SUGGEST RECOLLECTION (A)  Final   Report Status 06/18/2017 FINAL  Final  Blood culture (routine x 2)     Status: None (Preliminary result)   Collection Time: 06/17/17  2:17 PM  Result Value Ref Range Status   Specimen Description BLOOD LT HAND  Final   Special Requests   Final    BOTTLES DRAWN AEROBIC AND ANAEROBIC Blood Culture adequate volume   Culture NO GROWTH 3 DAYS  Final   Report Status PENDING  Incomplete  MRSA PCR Screening     Status: Abnormal   Collection Time: 06/17/17  6:15 PM  Result Value Ref Range Status   MRSA by PCR POSITIVE (A) NEGATIVE Final    Comment:        The GeneXpert MRSA Assay (FDA approved for NASAL specimens only), is one component of  a comprehensive MRSA colonization surveillance program. It is not intended to diagnose MRSA infection nor to guide or monitor treatment for MRSA infections. RESULT CALLED TO, READ BACK BY AND VERIFIED WITH: CALLED KELLY ZIELKE AT 1945 ON 06/17/17 RWW     IMAGING: Dg Chest 2 View  Result Date: 06/20/2017 CLINICAL DATA:  Hypoxia, altered mental status, Parkinson's disease EXAM: CHEST  2 VIEW COMPARISON:  Chest x-ray of 06/17/2017 FINDINGS: There is opacity at both lung bases on the lateral view which may indicate effusions as well as volume loss. There is mild to moderate cardiomegaly present and mild pulmonary vascular congestion may be present as well. These findings may indicate mild CHF. No bony abnormality is seen. Old right mid clavicular fracture is noted. IMPRESSION: Cardiomegaly with apparent bilateral pleuro effusions and mild congestion may indicate mild CHF. Electronically Signed   By: Ivar Drape M.D.   On: 06/20/2017 09:43   Dg Chest Portable 1 View  Result Date: 06/17/2017 CLINICAL DATA:  Altered mental status and UTI EXAM: PORTABLE CHEST 1 VIEW COMPARISON:  01/11/2017 FINDINGS: Cardiac shadow is mildly enlarged. The lungs are well aerated bilaterally. No focal infiltrate or sizable effusion is seen. No bony abnormality is noted. IMPRESSION: No active disease. Electronically Signed   By: Inez Catalina M.D.   On: 06/17/2017 12:44   Dg Abd 2 Views  Result Date: 06/17/2017 CLINICAL DATA:  Abdominal pain. EXAM: ABDOMEN - 2 VIEW COMPARISON:  10/08/2016 FINDINGS: No evidence for free air. Surgical clips in the right upper abdomen. Right hip replacement. Gas in the small and large bowel. Multilevel degenerative disease in the thoracic and lumbar spine. IMPRESSION: No acute abnormality.  Nonobstructive bowel gas pattern. Electronically Signed   By: Markus Daft M.D.   On: 06/17/2017 15:18    Assessment:   Maureen Taylor is a 82 y.o. female with PD and dementia as well as recurrent UTIs  admitted with AMS. SHe has evidnence UTI but ucx mixed initially. Has afoley now. WBC increasing  - has progressive R heel wound.  Recommendations Check wound cx R heel Recheck ua and ucx Cont vanco Add ceftriaxone Thank you very much for allowing me to participate in the care of this patient. Please call  with questions.   Cheral Marker. Ola Spurr, MD

## 2017-06-20 NOTE — Progress Notes (Signed)
Pt having expiratory wheezing with no relief from Breathing Treatments. Primary nurse spoke with Dr. Sheryle Hail in person and orders were received to Stop IV fluids. Primary nurse to continue to monitor.

## 2017-06-20 NOTE — Progress Notes (Signed)
[employer] -  at [facility]   PATIENT NAME: Maureen Taylor    MR#:  [id number]  DATE OF BIRTH:  [birth year]  SUBJECTIVE:  CHIEF COMPLAINT:   Chief Complaint  Patient presents with  . Altered Mental Status    Have parkinson's dz, lives in NH- no bladder control for last >  1 year. Sent with sepsis and UTI. More alert today. REVIEW OF SYSTEMS:   Pt is not able to give much details.  ROS  DRUG ALLERGIES:   Allergies  Allergen Reactions  . Codeine Itching  . Morphine And Related Itching  . Indocin [Indomethacin] Rash    VITALS:  Blood pressure 105/61, pulse 96, temperature 99.1 F (37.3 C), temperature source Axillary, resp. rate (!) 24, height  (1.651 m), weight 111.2 kg (245 lb 1.6 oz), SpO2 99 %.  PHYSICAL EXAMINATION:  GENERAL:  82 y.o.-year-old patient lying in the bed with no acute distress.  EYES: Pupils equal, round, reactive to light and accommodation. No scleral icterus. Extraocular muscles intact.  HEENT: Head atraumatic, normocephalic. Oropharynx and nasopharynx clear.  NECK:  Supple, no jugular venous distention. No thyroid enlargement, no tenderness.  LUNGS: Normal breath sounds bilaterally, no wheezing, rales,rhonchi or crepitation. No use of accessory muscles of respiration.  CARDIOVASCULAR: S1, S2 normal. No murmurs, rubs, or gallops.  ABDOMEN: Soft, nontender, nondistended. Bowel sounds present. No organomegaly or mass.  EXTREMITIES: both legs excessive skin flaking and redness. No edema. NEUROLOGIC: Cranial nerves II through XII are intact. Muscle strength 2-3/5 in all extremities. Sensation intact. Gait not checked.  PSYCHIATRIC: The patient is alert and oriented x 1.  SKIN: all over the body- redness and fine flaking present.   Physical Exam LABORATORY PANEL:   CBC  Recent Labs Lab 06/20/17 0330  WBC 16.7*  HGB 9.7*  HCT 29.0*  PLT 323    ------------------------------------------------------------------------------------------------------------------  Chemistries   Recent Labs Lab 06/18/17 0343  06/20/17 0330  NA 144  < > 140  K 4.3  < > 3.8  CL 114*  < > 111  CO2 25  < > 23  GLUCOSE 153*  < > 190*  BUN 21*  < > 16  CREATININE 1.20*  < > 1.06*  CALCIUM 7.9*  < > 8.0*  AST 35  --   --   ALT 5*  --   --   ALKPHOS 99  --   --   BILITOT 0.4  --   --   < > = values in this interval not displayed. ------------------------------------------------------------------------------------------------------------------  Cardiac Enzymes No results for input(s): TROPONINI in the last 168 hours. ------------------------------------------------------------------------------------------------------------------  RADIOLOGY:  No results found.  ASSESSMENT AND PLAN:   Principal Problem:   SIRS (systemic inflammatory response syndrome) ([facility]) Active Problems:   Parkinson's disease ([facility])   UTI (urinary tract infection)   Acute encephalopathy   Pressure injury of skin  * Sepsis, Acute metabolic encephalopathy   UTI     IV Abx, cx sent- report multiple species.   Appreciated help of Urology   Foley and may need proper treatment of constipation.  * DM   COnt Lantus and ISS  * Hypertension    Cont metoprolol.  * Skin disease   For last few weeks, have skin redness and flaking as per daughter.    She is advised to go to Dermatologist for biopsy asa per daughter.    I will advise to cont with the plan.  * Parkinson's disease  Cont home meds  * Wheezing   Added duoneb.  All the records are reviewed and case discussed with Care Management/Social Workerr. Management plans discussed with the patient, family and they are in agreement.  CODE STATUS: Full.  TOTAL TIME TAKING CARE OF THIS PATIENT: 35 minutes.    POSSIBLE D/C IN 1-2 DAYS, DEPENDING ON CLINICAL CONDITION.   Altamese Dilling M.D on 06/20/2017    Between 7am to 6pm - Pager - [phone]  After 6pm go to [email or web address] - password [email or web address]  [facility]  Office  [phone]  CC: Primary care physician; Oneal Grout, MD  Note: This dictation was prepared with Dragon dictation along with smaller phrase technology. Any transcriptional errors that result from this process are unintentional.

## 2017-06-20 NOTE — Consult Note (Signed)
Pharmacy Antibiotic Note  Maureen Taylor is a 82 y.o. female admitted on 06/17/2017 with cellulitis.  Pharmacy has been consulted for vancomycin dosing.  Plan: Vancomycin  once. Will give next dose in 17hr for stacked dosing Vancomycin  IV every 24 hours.  Goal trough 10-15 mcg/mL.  Trough prior to the 5th total dose  Height:  (165.1 cm) Weight: 245 lb 1.6 oz (111.2 kg) IBW/kg (Calculated) : 57  Temp (24hrs), Avg:98.3 F (36.8 C), Min:97.6 F (36.4 C), Max:99.1 F (37.3 C)   Recent Labs Lab 06/17/17 1200 06/17/17 1510 06/18/17 0343 06/19/17 0333 06/20/17 0330  WBC 12.8*  --  11.3* 15.8* 16.7*  CREATININE 1.44*  --  1.20* 1.28* 1.06*  LATICACIDVEN 2.1* 1.4  --   --   --     Estimated Creatinine Clearance: 53.5 mL/min (A) (by C-G formula based on SCr of 1.06 mg/dL (H)).    Allergies  Allergen Reactions  . Codeine Itching  . Morphine And Related Itching  . Indocin [Indomethacin] Rash    Antimicrobials this admission: vancomycin 9/25 >>  ceftriaxone 9/22 >>   Dose adjustments this admission:   Microbiology results: 9/22 BCx: NG 9/23 UCx: mult species   9/22 MRSA PCR: positive  Thank you for allowing pharmacy to be a part of this patient's care.  Olene Floss, Pharm.D, BCPS Clinical Pharmacist  06/20/2017 1:29 PM

## 2017-06-20 NOTE — Progress Notes (Signed)
Pt very confused screaming very loudly; pulling at catheter; tring to get out of the bed. Primary nurse notified Dr. Anne Hahn and orders received for haldol. Primary nurse to continue to monitor.

## 2017-06-21 ENCOUNTER — Inpatient Hospital Stay: Payer: Medicare Other

## 2017-06-21 LAB — BASIC METABOLIC PANEL
ANION GAP: 10 (ref 5–15)
BUN: 16 mg/dL (ref 6–20)
CALCIUM: 8.7 mg/dL — AB (ref 8.9–10.3)
CO2: 22 mmol/L (ref 22–32)
CREATININE: 0.99 mg/dL (ref 0.44–1.00)
Chloride: 110 mmol/L (ref 101–111)
GFR calc Af Amer: >60 mL/min (ref >60)
GFR, EST NON AFRICAN AMERICAN: 53 mL/min — AB (ref >60)
GLUCOSE: 148 mg/dL — AB (ref 65–99)
Potassium: 4.3 mmol/L (ref 3.5–5.1)
Sodium: 142 mmol/L (ref 135–145)

## 2017-06-21 LAB — GLUCOSE, CAPILLARY
GLUCOSE-CAPILLARY: 139 mg/dL — AB (ref 65–99)
GLUCOSE-CAPILLARY: 148 mg/dL — AB (ref 65–99)
Glucose-Capillary: 161 mg/dL — ABNORMAL HIGH (ref 65–99)
Glucose-Capillary: 123 mg/dL — ABNORMAL HIGH (ref 65–99)
Glucose-Capillary: 122 mg/dL — ABNORMAL HIGH (ref 65–99)

## 2017-06-21 LAB — CBC
HCT: 32.9 % — ABNORMAL LOW (ref 35.0–47.0)
Hemoglobin: 10.9 g/dL — ABNORMAL LOW (ref 12.0–16.0)
MCH: 29.8 pg (ref 26.0–34.0)
MCHC: 33.1 g/dL (ref 32.0–36.0)
MCV: 90 fL (ref 80.0–100.0)
PLATELETS: 378 10*3/uL (ref 150–440)
RBC: 3.66 MIL/uL — ABNORMAL LOW (ref 3.80–5.20)
RDW: 14 % (ref 11.5–14.5)
WBC: 18.4 10*3/uL — AB (ref 3.6–11.0)

## 2017-06-21 MED ORDER — INSULIN GLARGINE 100 UNIT/ML ~~LOC~~ SOLN
15.0000 [IU] | Freq: Two times a day (BID) | SUBCUTANEOUS | Status: DC
  Administered 2017-06-22 (×2): 15 [IU] via SUBCUTANEOUS
  Filled 2017-06-21 (×6): qty 0.15

## 2017-06-21 MED ORDER — LORAZEPAM 2 MG/ML IJ SOLN
0.5000 mg | Freq: Once | INTRAMUSCULAR | Status: AC
  Administered 2017-06-21: 0.5 mg via INTRAVENOUS
  Filled 2017-06-21: qty 1

## 2017-06-21 MED ORDER — FUROSEMIDE 10 MG/ML IJ SOLN
40.0000 mg | Freq: Once | INTRAMUSCULAR | Status: AC
  Administered 2017-06-21: 40 mg via INTRAVENOUS
  Filled 2017-06-21: qty 4

## 2017-06-21 MED ORDER — RISAQUAD PO CAPS
1.0000 | ORAL_CAPSULE | Freq: Every day | ORAL | Status: DC
  Administered 2017-06-22 – 2017-06-24 (×3): 1 via ORAL
  Filled 2017-06-21 (×3): qty 1

## 2017-06-21 MED ORDER — POLYETHYLENE GLYCOL 3350 17 G PO PACK
17.0000 g | PACK | Freq: Every day | ORAL | Status: DC
  Administered 2017-06-22 – 2017-06-24 (×2): 17 g via ORAL
  Filled 2017-06-21 (×3): qty 1

## 2017-06-21 NOTE — Progress Notes (Signed)
Pt started yelling out. Primary nurse along with other nursing staff rounded on pt. Pt was found to be in respiratory distress. Pt very tachypnea, wheezing; Pt sated " I am gonna die" . Rapid Response was called on pt. Prime Dr. Truitt Merle. Orders received for 0.5 mg of ativan. Pt was given breathing treatment along with ativan. Chest x ray was ordered and obtained. Primary nursing staff to continue to monitor.

## 2017-06-21 NOTE — Progress Notes (Signed)
Pt very lethargic this morning. unsure if she is going to eat. Per MD okay to hold Lantus this AM

## 2017-06-21 NOTE — Progress Notes (Signed)
[facility] INFECTIOUS DISEASE PROGRESS NOTE Date of Admission:  06/17/2017     ID: Maureen Taylor is a 82 y.o. female with UTI, heel wound Principal Problem:   SIRS (systemic inflammatory response syndrome) (HCC) Active Problems:   Parkinson's disease (HCC)   UTI (urinary tract infection)   Acute encephalopathy   Pressure injury of skin   Subjective: Per partner less responsive today ,ate only little. No fevers, wbc remains elevated.   ROS  Unable to obtain   Medications:  Antibiotics Given (last 72 hours)    Date/Time Action Medication Dose Rate   06/19/17 1122 New Bag/Given  [Night Shift RN rescheduled to be given at 1000. Pt loss IV access at 0735 this A.M. during bedside reporting.]   cefTRIAXone (ROCEPHIN) 1 g in dextrose 5 % 50 mL IVPB 1 g 100 mL/hr   06/20/17 0643 New Bag/Given   cefTRIAXone (ROCEPHIN) 1 g in dextrose 5 % 50 mL IVPB 1 g 100 mL/hr   06/20/17 1519 New Bag/Given   vancomycin (VANCOCIN) 1,500 mg in sodium chloride 0.9 % 500 mL IVPB 1,500 mg 250 mL/hr   06/21/17 0601 New Bag/Given   cefTRIAXone (ROCEPHIN) 1 g in dextrose 5 % 50 mL IVPB 1 g 100 mL/hr   06/21/17 0854 New Bag/Given   vancomycin (VANCOCIN) 1,250 mg in sodium chloride 0.9 % 250 mL IVPB 1,250 mg 166.7 mL/hr     . acidophilus  1 capsule Oral Daily  . ARIPiprazole  2.5 mg Oral BID  . aspirin EC  81 mg Oral Daily  . atorvastatin  20 mg Oral Daily  . carbidopa-levodopa  1 tablet Oral BID  . Chlorhexidine Gluconate Cloth  6 each Topical Q0600  . clotrimazole   Topical BID  . docusate sodium  100 mg Oral BID  . DULoxetine  60 mg Oral Daily  . enoxaparin (LOVENOX) injection  40 mg Subcutaneous Q24H  . famotidine  20 mg Oral BID  . gabapentin  200 mg Oral QHS  . hydrocerin   Topical BID  . insulin aspart  0-9 Units Subcutaneous TID WC  . insulin aspart  4 Units Subcutaneous TID WC  . insulin glargine  15 Units Subcutaneous BID  . ipratropium-albuterol  3 mL Nebulization BID  . lamoTRIgine   100 mg Oral BID  . linaclotide  145 mcg Oral QAC breakfast  . lisinopril  20 mg Oral Daily  . mouth rinse  15 mL Mouth Rinse BID  . metoprolol tartrate  25 mg Oral BID  . metroNIDAZOLE   Topical Once per day on Mon Wed Fri  . multivitamin-lutein  1 capsule Oral Daily  . mupirocin ointment  1 application Nasal BID  . oxybutynin  5 mg Oral BID  . pantoprazole  40 mg Oral BID  . polyethylene glycol  17 g Oral Daily    Objective: Vital signs in last 24 hours: Temp:  [98.6 F (37 C)-99 F (37.2 C)] 98.8 F (37.1 C) (09/26 1229) Pulse Rate:  [91-118] 100 (09/26 1229) Resp:  [18-32] 18 (09/26 1229) BP: (114-153)/(51-83) 153/72 (09/26 1229) SpO2:  [94 %-99 %] 96 % (09/26 1229) Weight:  [111.7 kg (246 lb 3.2 oz)] 111.7 kg (246 lb 3.2 oz) (09/26 1610) Constitutional: demeted, lying in bed HENT: [place]/AT, PERRLA, no scleral icterus Mouth/Throat: Oropharynx is clear and moist. No oropharyngeal exudate.  Cardiovascular: Normal rate, regular rhythm and normal heart sounds. \ Pulmonary/Chest: Effort normal and breath sounds normal. No respiratory distress.  has no wheezes.  Neck = supple, no nuchal rigidity Abdominal: Soft. Bowel sounds are normal.  exhibits no distension. There is no tenderness.  Lymphadenopathy: no cervical adenopathy. No axillary adenopathy Neurological: lethragic, Skin: diffuse scaly eruption R heel ulcer dendued blister Psychiatric: unable to assess Lab Results  Recent Labs  06/20/17 0330 06/21/17 0652  WBC 16.7* 18.4*  HGB 9.7* 10.9*  HCT 29.0* 32.9*  NA 140 142  K 3.8 4.3  CL 111 110  CO2 23 22  BUN 16 16  CREATININE 1.06* 0.99    Microbiology: Results for orders placed or performed during the hospital encounter of 06/17/17  Blood culture (routine x 2)     Status: None (Preliminary result)   Collection Time: 06/17/17 12:10 PM  Result Value Ref Range Status   Specimen Description BLOOD RT HA  Final   Special Requests   Final    BOTTLES DRAWN AEROBIC  AND ANAEROBIC Blood Culture results may not be optimal due to an inadequate volume of blood received in culture bottles   Culture NO GROWTH 4 DAYS  Final   Report Status PENDING  Incomplete  Urine Culture     Status: Abnormal   Collection Time: 06/17/17 12:13 PM  Result Value Ref Range Status   Specimen Description URINE, RANDOM  Final   Special Requests NONE  Final   Culture MULTIPLE SPECIES PRESENT, SUGGEST RECOLLECTION (A)  Final   Report Status 06/18/2017 FINAL  Final  Blood culture (routine x 2)     Status: None (Preliminary result)   Collection Time: 06/17/17  2:17 PM  Result Value Ref Range Status   Specimen Description BLOOD LT HAND  Final   Special Requests   Final    BOTTLES DRAWN AEROBIC AND ANAEROBIC Blood Culture adequate volume   Culture NO GROWTH 4 DAYS  Final   Report Status PENDING  Incomplete  MRSA PCR Screening     Status: Abnormal   Collection Time: 06/17/17  6:15 PM  Result Value Ref Range Status   MRSA by PCR POSITIVE (A) NEGATIVE Final    Comment:        The GeneXpert MRSA Assay (FDA approved for NASAL specimens only), is one component of a comprehensive MRSA colonization surveillance program. It is not intended to diagnose MRSA infection nor to guide or monitor treatment for MRSA infections. RESULT CALLED TO, READ BACK BY AND VERIFIED WITH: CALLED KELLY ZIELKE AT 1945 ON 06/17/17 RWW   Aerobic Culture (superficial specimen)     Status: None (Preliminary result)   Collection Time: 06/20/17  3:29 PM  Result Value Ref Range Status   Specimen Description HEEL  Final   Special Requests Normal  Final   Gram Stain   Final    MODERATE WBC PRESENT,BOTH PMN AND MONONUCLEAR FEW GRAM VARIABLE ROD RARE GRAM POSITIVE COCCI    Culture   Final    CULTURE REINCUBATED FOR BETTER GROWTH Performed at [facility], [address], [place], [place] [address]    Report Status PENDING  Incomplete    Studies/Results: Dg Chest 2 View  Result Date:  06/20/2017 CLINICAL DATA:  Hypoxia, altered mental status, Parkinson's disease EXAM: CHEST  2 VIEW COMPARISON:  Chest x-ray of 06/17/2017 FINDINGS: There is opacity at both lung bases on the lateral view which may indicate effusions as well as volume loss. There is mild to moderate cardiomegaly present and mild pulmonary vascular congestion may be present as well. These findings may indicate mild CHF. No bony abnormality is  seen. Old right mid clavicular fracture is noted. IMPRESSION: Cardiomegaly with apparent bilateral pleuro effusions and mild congestion may indicate mild CHF. Electronically Signed   By: Dwyane Dee M.D.   On: 06/20/2017 09:43   Dg Chest Port 1 View  Result Date: 06/21/2017 CLINICAL DATA:  82 y/o  F; shortness of breath. EXAM: PORTABLE CHEST 1 VIEW COMPARISON:  06/20/2017 chest radiograph. FINDINGS: Stable cardiomegaly. Stable hazy opacification of lungs and blunting of costal diaphragmatic angles. No new focal consolidation. No acute osseous abnormality is evident. IMPRESSION: Stable cardiomegaly. Stable hazy opacification of lungs probably representing mild pulmonary edema. Stable bilateral effusions. Electronically Signed   By: Mitzi Hansen M.D.   On: 06/21/2017 02:00    Assessment/Plan: SANIKA BROSIOUS is a 82 y.o. female with PD and dementia as well as recurrent UTIs admitted with AMS. She has evidnence UTI but ucx mixed initially. Has a foley now and repeat UA shows TNTC WBC, ucx pendign. Wound cx on heel pending . WBC remains elevated. Remains lethargic  Recommendations Cont vanco and ceftriaxone UTI - repeat cx pending Heel cx pending - [facility] If still with increase wbc tomorrow may broaden gram negative coverage. Thank you very much for the consult. Will follow with you.  Mick Sell   06/21/2017, 4:05 PM

## 2017-06-21 NOTE — Progress Notes (Signed)
Pt very confused and agitated. Pt refused to take anything PO. Yelling at Nursing staff to get out of her room. Refuses to be touched and not allowing nursing staff to properly turn and reposition pt in bed. Pt FSBS decreasing without insulin from 165 to 141. Primary nurse notified Dr. Anne Hahn and orders received to hold Insulin tonight.Orders also received for [employer]  Primary nurse to continue to monitor pt.

## 2017-06-21 NOTE — Progress Notes (Signed)
Pt pulled mask off. Would not let tx finish and was agitated.

## 2017-06-21 NOTE — Progress Notes (Signed)
Per Dr. Esaw Grandchild okay to hold meal coverage and give 1 unit sliding scale.

## 2017-06-21 NOTE — Progress Notes (Signed)
Rapid Response Event Note  Overview:  Pt in bed anxious and with upper airway wheeze.    Initial Focused Assessment: Pt as above, vital signs per flowsheet. Pt is moaning and tachypneic   Interventions: breathing treatment in progress on arrival. MD callled orders for ativan obtained, given by primary RN  Plan of Care (if not transferred): Will continue to monitor and observe respiratory status.  Event Summary:   at      at          [facility]

## 2017-06-21 NOTE — Plan of Care (Signed)
Problem: Education: Goal: Knowledge of [facility] Education information/materials will improve Outcome: Not Progressing Pt lethargic  Problem: Safety: Goal: Ability to remain free from injury will improve Outcome: Not Progressing Pt not interactive in care

## 2017-06-21 NOTE — Progress Notes (Signed)
Pt remained with mild wheezing throughout night. Pt chest x-ray revealed pt had mild pulmonary edema. Primary nurse spoke to Dr. Sheryle Hail and orders were received for lasix. Pt more calm this this morning while providing pt care. Primary nurse to continue to monitor.

## 2017-06-22 LAB — URINE CULTURE: Culture: NO GROWTH

## 2017-06-22 LAB — GLUCOSE, CAPILLARY
GLUCOSE-CAPILLARY: 304 mg/dL — AB (ref 65–99)
Glucose-Capillary: 185 mg/dL — ABNORMAL HIGH (ref 65–99)
Glucose-Capillary: 251 mg/dL — ABNORMAL HIGH (ref 65–99)
Glucose-Capillary: 321 mg/dL — ABNORMAL HIGH (ref 65–99)

## 2017-06-22 LAB — CBC
HEMATOCRIT: 35.3 % (ref 35.0–47.0)
HEMOGLOBIN: 11.6 g/dL — AB (ref 12.0–16.0)
MCH: 29.8 pg (ref 26.0–34.0)
MCHC: 32.9 g/dL (ref 32.0–36.0)
MCV: 90.7 fL (ref 80.0–100.0)
PLATELETS: 406 10*3/uL (ref 150–440)
RBC: 3.89 MIL/uL (ref 3.80–5.20)
RDW: 14 % (ref 11.5–14.5)
WBC: 16.2 10*3/uL — AB (ref 3.6–11.0)

## 2017-06-22 LAB — CULTURE, BLOOD (ROUTINE X 2)
CULTURE: NO GROWTH
CULTURE: NO GROWTH
Special Requests: ADEQUATE

## 2017-06-22 MED ORDER — FUROSEMIDE 10 MG/ML IJ SOLN
40.0000 mg | Freq: Once | INTRAMUSCULAR | Status: AC
Start: 2017-06-22 — End: 2017-06-22
  Administered 2017-06-22: 40 mg via INTRAVENOUS
  Filled 2017-06-22: qty 4

## 2017-06-22 MED ORDER — METOPROLOL TARTRATE 25 MG PO TABS
25.0000 mg | ORAL_TABLET | Freq: Two times a day (BID) | ORAL | Status: DC
  Administered 2017-06-22 – 2017-06-24 (×5): 25 mg via ORAL
  Filled 2017-06-22 (×5): qty 1

## 2017-06-22 MED ORDER — LORAZEPAM 2 MG/ML IJ SOLN
0.5000 mg | Freq: Two times a day (BID) | INTRAMUSCULAR | Status: DC | PRN
  Administered 2017-06-22: 0.5 mg via INTRAVENOUS
  Filled 2017-06-22: qty 1

## 2017-06-22 MED ORDER — METHYLPREDNISOLONE SODIUM SUCC 125 MG IJ SOLR
60.0000 mg | INTRAMUSCULAR | Status: DC
  Administered 2017-06-22 – 2017-06-24 (×3): 60 mg via INTRAVENOUS
  Filled 2017-06-22 (×3): qty 2

## 2017-06-22 MED ORDER — RENA-VITE PO TABS
1.0000 | ORAL_TABLET | Freq: Every day | ORAL | Status: DC
  Administered 2017-06-22 – 2017-06-23 (×2): 1 via ORAL
  Filled 2017-06-22 (×4): qty 1

## 2017-06-22 MED ORDER — METOPROLOL TARTRATE 5 MG/5ML IV SOLN
5.0000 mg | Freq: Four times a day (QID) | INTRAVENOUS | Status: DC
  Administered 2017-06-22: 5 mg via INTRAVENOUS

## 2017-06-22 MED ORDER — SODIUM CHLORIDE 0.9 % IV SOLN: INTRAVENOUS | Status: DC

## 2017-06-22 MED ORDER — OMEGA-3-ACID ETHYL ESTERS 1 G PO CAPS
1.0000 g | ORAL_CAPSULE | Freq: Two times a day (BID) | ORAL | Status: DC
  Administered 2017-06-22 – 2017-06-24 (×5): 1 g via ORAL
  Filled 2017-06-22 (×5): qty 1

## 2017-06-22 MED ORDER — METOPROLOL TARTRATE 5 MG/5ML IV SOLN
2.5000 mg | Freq: Three times a day (TID) | INTRAVENOUS | Status: DC
  Administered 2017-06-22: 2.5 mg via INTRAVENOUS
  Filled 2017-06-22 (×2): qty 5

## 2017-06-22 MED ORDER — ENSURE ENLIVE PO LIQD
237.0000 mL | Freq: Two times a day (BID) | ORAL | Status: DC
  Administered 2017-06-22 – 2017-06-24 (×5): 237 mL via ORAL

## 2017-06-22 NOTE — Progress Notes (Signed)
[employer] [facility] [facility] [place] at [facility]   PATIENT NAME: Maureen Taylor    MR#:  [id number]  DATE OF BIRTH:  07-14-1935  SUBJECTIVE:  CHIEF COMPLAINT:   Chief Complaint  Patient presents with  . Altered Mental Status    Have parkinson's dz, lives in NH- no bladder control for last >  1 year. Sent with sepsis and UTI. More drowsy today.  WBCs rising.  REVIEW OF SYSTEMS:   Pt is not able to give much details.  ROS  DRUG ALLERGIES:   Allergies  Allergen Reactions  . Codeine Itching  . Morphine And Related Itching  . Indocin [Indomethacin] Rash    VITALS:  Blood pressure (!) 143/69, pulse (!) 104, temperature 98.3 F (36.8 C), temperature source Oral, resp. rate 20, height  (1.651 m), weight 111.7 kg (246 lb 3.2 oz), SpO2 97 %.  PHYSICAL EXAMINATION:  GENERAL:  82 y.o.-year-old patient lying in the bed with no acute distress.  EYES: Pupils equal, round, reactive to light and accommodation. No scleral icterus. Extraocular muscles intact.  HEENT: Head atraumatic, normocephalic. Oropharynx and nasopharynx clear.  NECK:  Supple, no jugular venous distention. No thyroid enlargement, no tenderness.  LUNGS: Normal breath sounds bilaterally, no wheezing, rales,rhonchi or crepitation. No use of accessory muscles of respiration.  CARDIOVASCULAR: S1, S2 normal. No murmurs, rubs, or gallops.  ABDOMEN: Soft, nontender, nondistended. Bowel sounds present. No organomegaly or mass.  EXTREMITIES: both legs excessive skin flaking and redness. No edema. Right heel ulcer NEUROLOGIC: Drowsy, with sluggish response to stimuli. Muscle strength 2-3/5 in all extremities. Sensation and Gait not checked.  PSYCHIATRIC: The patient is drowsy and oriented x 1.  SKIN: all over the body- redness and fine flaking present.   Physical Exam LABORATORY PANEL:   CBC  Recent Labs Lab 06/22/17 0533  WBC 16.2*  HGB 11.6*  HCT 35.3  PLT 406    ------------------------------------------------------------------------------------------------------------------  Chemistries   Recent Labs Lab 06/18/17 0343  06/21/17 0652  NA 144  < > 142  K 4.3  < > 4.3  CL 114*  < > 110  CO2 25  < > 22  GLUCOSE 153*  < > 148*  BUN 21*  < > 16  CREATININE 1.20*  < > 0.99  CALCIUM 7.9*  < > 8.7*  AST 35  --   --   ALT 5*  --   --   ALKPHOS 99  --   --   BILITOT 0.4  --   --   < > = values in this interval not displayed. ------------------------------------------------------------------------------------------------------------------  Cardiac Enzymes No results for input(s): TROPONINI in the last 168 hours. ------------------------------------------------------------------------------------------------------------------  RADIOLOGY:  Dg Chest 2 View  Result Date: 06/20/2017 CLINICAL DATA:  Hypoxia, altered mental status, Parkinson's disease EXAM: CHEST  2 VIEW COMPARISON:  Chest x-ray of 06/17/2017 FINDINGS: There is opacity at both lung bases on the lateral view which may indicate effusions as well as volume loss. There is mild to moderate cardiomegaly present and mild pulmonary vascular congestion may be present as well. These findings may indicate mild CHF. No bony abnormality is seen. Old right mid clavicular fracture is noted. IMPRESSION: Cardiomegaly with apparent bilateral pleuro effusions and mild congestion may indicate mild CHF. Electronically Signed   By: Dwyane Dee M.D.   On: 06/20/2017 09:43   Dg Chest Port 1 View  Result Date: 06/21/2017 CLINICAL DATA:  82 y/o  F; shortness of breath. EXAM: PORTABLE CHEST 1 VIEW  COMPARISON:  06/20/2017 chest radiograph. FINDINGS: Stable cardiomegaly. Stable hazy opacification of lungs and blunting of costal diaphragmatic angles. No new focal consolidation. No acute osseous abnormality is evident. IMPRESSION: Stable cardiomegaly. Stable hazy opacification of lungs probably representing mild  pulmonary edema. Stable bilateral effusions. Electronically Signed   By: Mitzi Hansen M.D.   On: 06/21/2017 02:00    ASSESSMENT AND PLAN:   Principal Problem:   SIRS (systemic inflammatory response syndrome) (HCC) Active Problems:   Parkinson's disease (HCC)   UTI (urinary tract infection)   Acute encephalopathy   Pressure injury of skin  * Sepsis, Acute metabolic encephalopathy   UTI     IV Abx, cx sent- report multiple species.   Appreciated help of Urology   Foley and may need proper treatment of constipation.   As WBCs keep rising and pt remains confused, Called ID consult, he suggest to send Ur cx again from Foley.  * Cellulitis? Right heel ulcer    With her skin changes and elevating WBCs, I am not very sure, but suspect cellulitis on legs.    MRSA positive, so give Vanc Iv.   Appreciated help by ID, sent Cx from heel ulcer.  * DM   COnt Lantus and ISS   As with lethargy, not eating much, held lantus.  * Hypertension    Cont metoprolol.   Can not take oral meds with lethargy, but BP controlled.  * Skin disease   For last few weeks, have skin redness and flaking as per daughter.    She is advised to go to Dermatologist for biopsy asa per daughter.    I will advise to cont with the plan.  * Parkinson's disease   Cont home meds  * Wheezing   Added duoneb.   Xray chest clear.  All the records are reviewed and case discussed with Care Management/Social Workerr. Management plans discussed with the patient, family and they are in agreement.  CODE STATUS: Full.  TOTAL TIME TAKING CARE OF THIS PATIENT: 35 minutes.    POSSIBLE D/C IN 1-2 DAYS, DEPENDING ON CLINICAL CONDITION.   Altamese Dilling M.D on 06/22/2017   Between 7am to 6pm - Pager - [phone]  After 6pm go to [email or web address] - password [email or web address]  [facility]  Office  [phone]  CC: Primary care physician; Oneal Grout, MD  Note: This dictation was  prepared with Dragon dictation along with smaller phrase technology. Any transcriptional errors that result from this process are unintentional.

## 2017-06-22 NOTE — Progress Notes (Signed)
[employer] - [place] at [facility]   PATIENT NAME: Maureen Taylor    MR#:  [id number]  DATE OF BIRTH:  June 27, 1935  SUBJECTIVE:  CHIEF COMPLAINT:   Chief Complaint  Patient presents with  . Altered Mental Status    Have parkinson's dz, lives in NH- no bladder control for last >  1 year. Sent with sepsis and UTI. More drowsy today.  WBCs came down.  REVIEW OF SYSTEMS:   Pt is not able to give much details.  ROS  DRUG ALLERGIES:   Allergies  Allergen Reactions  . Codeine Itching  . Morphine And Related Itching  . Indocin [Indomethacin] Rash    VITALS:  Blood pressure (!) 129/49, pulse (!) 50, temperature 98.3 F (36.8 C), temperature source Oral, resp. rate 18, height  (1.651 m), weight 111.7 kg (246 lb 3.2 oz), SpO2 94 %.  PHYSICAL EXAMINATION:  GENERAL:  82 y.o.-year-old patient lying in the bed with no acute distress.  EYES: Pupils equal, round, reactive to light and accommodation. No scleral icterus. Extraocular muscles intact.  HEENT: Head atraumatic, normocephalic. Oropharynx and nasopharynx clear.  NECK:  Supple, no jugular venous distention. No thyroid enlargement, no tenderness.  LUNGS: Normal breath sounds bilaterally, no wheezing, rales,rhonchi or crepitation. No use of accessory muscles of respiration.  CARDIOVASCULAR: S1, S2 normal. No murmurs, rubs, or gallops.  ABDOMEN: Soft, nontender, nondistended. Bowel sounds present. No organomegaly or mass.  EXTREMITIES: both legs excessive skin flaking and redness. No edema. Right heel ulcer NEUROLOGIC: Drowsy, with sluggish response to stimuli. Muscle strength 2-3/5 in all extremities. Sensation and Gait not checked.  PSYCHIATRIC: The patient is drowsy and oriented x 1.  SKIN: all over the body- redness and fine flaking present.   Physical Exam LABORATORY PANEL:   CBC  Recent Labs Lab 06/22/17 0533  WBC 16.2*  HGB 11.6*  HCT 35.3  PLT 406    ------------------------------------------------------------------------------------------------------------------  Chemistries   Recent Labs Lab 06/18/17 0343  06/21/17 0652  NA 144  < > 142  K 4.3  < > 4.3  CL 114*  < > 110  CO2 25  < > 22  GLUCOSE 153*  < > 148*  BUN 21*  < > 16  CREATININE 1.20*  < > 0.99  CALCIUM 7.9*  < > 8.7*  AST 35  --   --   ALT 5*  --   --   ALKPHOS 99  --   --   BILITOT 0.4  --   --   < > = values in this interval not displayed. ------------------------------------------------------------------------------------------------------------------  Cardiac Enzymes No results for input(s): TROPONINI in the last 168 hours. ------------------------------------------------------------------------------------------------------------------  RADIOLOGY:  Dg Chest Port 1 View  Result Date: 06/21/2017 CLINICAL DATA:  82 y/o  F; shortness of breath. EXAM: PORTABLE CHEST 1 VIEW COMPARISON:  06/20/2017 chest radiograph. FINDINGS: Stable cardiomegaly. Stable hazy opacification of lungs and blunting of costal diaphragmatic angles. No new focal consolidation. No acute osseous abnormality is evident. IMPRESSION: Stable cardiomegaly. Stable hazy opacification of lungs probably representing mild pulmonary edema. Stable bilateral effusions. Electronically Signed   By: Mitzi Hansen M.D.   On: 06/21/2017 02:00    ASSESSMENT AND PLAN:   Principal Problem:   SIRS (systemic inflammatory response syndrome) (HCC) Active Problems:   Parkinson's disease (HCC)   UTI (urinary tract infection)   Acute encephalopathy   Pressure injury of skin  * Sepsis, Acute metabolic encephalopathy   UTI, cellulitis  IV Abx, cx sent- report multiple species.   Appreciated help of Urology   Foley and may need proper treatment of constipation.   As WBCs keep rising and pt remains confused, Called ID consult, he suggest to send Ur cx again from Foley. Sent.   After 5-6 days  in hospital, remains drowsy- called Palliative care.  * Cellulitis on legs, Right heel ulcer    With her skin changes and elevating WBCs,    MRSA positive, so give Vanc Iv.   Appreciated help by ID, sent Cx from heel ulcer.  * DM   COnt Lantus and ISS   As with lethargy, not eating much, held lantus.  * Hypertension    Cont metoprolol.  * Skin disease   For last few weeks, have skin redness and flaking as per daughter.    She is advised to go to Dermatologist for biopsy asa per daughter.    Called dermatology consult here.  * Parkinson's disease   Cont home meds  * Wheezing   Added duoneb.   Xray chest clear.   All the records are reviewed and case discussed with Care Management/Social Workerr. Management plans discussed with the patient, family and they are in agreement.  CODE STATUS: Full.  TOTAL TIME TAKING CARE OF THIS PATIENT: 35 minutes.    POSSIBLE D/C IN 1-2 DAYS, DEPENDING ON CLINICAL CONDITION.   Altamese Dilling M.D on 06/22/2017   Between 7am to 6pm - Pager - [phone]  After 6pm go to [email or web address] - password [email or web address]  [facility]  Office  [phone]  CC: Primary care physician; Oneal Grout, MD  Note: This dictation was prepared with Dragon dictation along with smaller phrase technology. Any transcriptional errors that result from this process are unintentional.

## 2017-06-22 NOTE — Consult Note (Signed)
CC:  Ms. Figuereo was seen in consultation at the request of Dr. Elisabeth Pigeon for evaluation of a rash.   HPI: Ms. Groom is a 82 year old woman with history of Parkinson's disease, bipolar disorder, hypertension, and recurrent urinary tract infections admitted on 9/22 with unresponsiveness, lethargy, and hypotension.  She is not able to provide signficant history due to her current mental status.  Much of the history was provided to me by her companion and nursing staff in the room during my visit.  Evidently, she has had a worsening generalized red scaly rash over the last few weeks.  This was thought to be caused by a medication, but an exact culprit could not be determined.  It appears that she was on Augmentin prior to admision. Upon admission here, she has received vancomycin and ceftriaxone.  No blisters or mucosal lesions have been observed.    Review of systems: Patient is unable to provide such history.  Physical examination:  General:  Patient is lethargic.  Minimally communicative. Skin: Examination of the scalp, face, eyelids, lips, oral mucosa, neck, chest, back, abdomen, right upper extremity, left upper extremity, right lower extremity, left lower extremity performed and remarkable for: Diffuse blanchable erythema with scale.  No vesicles, bullae, or erosions.  No oral erosions appreciated.  Assessment and Plan:  Erythroderma consistent with drug eruption.  Without a detailed drug history, it is challenging to determine which drug is the culprit.  However, antibiotics tend to be the most common cause in this setting.  It would be nice to confirm that the patient has been on lamotrigine long term, as this can cause impressive drug eruptions as well.    I did perform a skin biopsy from a representative area on the right shoulder, and anticipate a result by Monday at the latest.  I will notify the service with this result, however I do not anticipate that it will provide any significant  relevatory information, as the result will likely be nonspecific with a mixed dermal infiltrate that includes eosinophils consistent with a drug eruption.  Suture should be removed in 2 weeks.    If she is able to tolerate systemic steroids, prednisone (starting at  daily and tapered slowly over 3-4 weeks) may be considered.  The patient's friend (Mr. Suzie Portela) stated that staff at the SNF thought that the rash may have been caused by prednisone, but this seems highly unlikely, and Mr. Suzie Portela is certain that prednisone was started after the rash began.  Alternatively, I would recommended triamcinolone 0.1% cream by applied over the whole body twice daily.  This will be easiest if a one-pound jar is ordered to the bedside so that nursing staff can access it easily.  Thank you for the consult.  Pilar Plate [facility]  [phone] cell

## 2017-06-22 NOTE — Consult Note (Signed)
WOC Nurse wound consult note Reason for Consult:Right heel stage II with DTI in center of wound Wound type:pressure Pressure Injury POA: Yes Measurement:5cm x 5cm x 0.1cm stage II ulcer with pink bed, with thick slimy yellow exudate. There is a 1cm x 2cm DTI in center of wound.  Wound bed:see above Drainage (amount, consistency, odor) see above Periwound: dry and scaly Dressing procedure/placement/frequency: Removed hanging flap from ruptured blister. I have provided nurses with orders for To right heel, cleanse with NS, pat dry, apply Xeroform, cover with dry gauze, wrap with kerlix or kling, adhere with tape, perform daily. I have performed today. Pt already has Eucerin ordered for feet and legs.  Her entire body is covered in a rash,  Dermatology has been consulted. Her lab work shows she is malnourished, please order nutritional consult or protein supplements if you agree. We will not follow, but will remain available to this patient, to nursing, and the medical and/or surgical teams.  Please re-consult if we need to assist further.   Barnett Hatter, RN-C, WTA-C Wound Treatment Associate

## 2017-06-22 NOTE — Progress Notes (Signed)
Initial Nutrition Assessment  DOCUMENTATION CODES:   Morbid obesity  INTERVENTION:   Ensure Enlive po BID, each supplement provides 350 kcal and 20 grams of protein  Renal MVI  Ocuvite-Lutein  Omega 3 fatty acid daily  Probiotics    NUTRITION DIAGNOSIS:   Inadequate oral intake related to acute illness as evidenced by meal completion < 50%.  GOAL:   Patient will meet greater than or equal to 90% of their needs  MONITOR:   PO intake, Supplement acceptance, Labs, Weight trends, Skin  REASON FOR ASSESSMENT:   Low Braden    ASSESSMENT:   82 y/o female with h/o parkinson's dz, DM, dementia, lives in NH- no bladder control for last >  1 year. Admitted with SIRS, sepsis, and UTI.   Met with pt and pt's daughter in room today. Pt is a poor historian but is able to tell me that she has a good appetite today. Nutrition history obtained from pt's daughter who reports that pt is usually a good eater at home; pt was eating good up until a few days pta. Pt's meal tray was on side table; pt ate about 75% of her lunch today. Per pt's daughter, pt did not eat well at breakfast and she did not eat anything yesterday r/t severe lethargy. Per chart, pt appears weight stable pta but has gained 12lbs since admit. Wt gain likely related to moderate edema in BLE. Pt noted to have red, dry, and flaky skin over her entire body on nutrition focused exam today. There is a large level of flaking around her scalp. Pt's daughter reports that this is new and has occurred only over the last few weeks. Pt also noted to have thinning hair which daughter reports is normal for this pt. Pt denies changes to her hair, nails, or tongue. Pt ordered for Ocuvite-Luetin vitamin daily, RD will add Rena-vit to include B-vitamins and folic acid. Recommend adding Omega-3 fatty acid daily. Pt is lactose intolerant; Ensure [place] lactose free.   Medications reviewed and include: Risaquad, aspirin, colace, lovenox, pepcid,  insulin, linaclotide, solumedrol, metronidazole, protonix, miralax, ceftriaxone, vancomycin   Labs reviewed: Ca 8.7(L) Wbc- 16.2(H) cbgs- 214, 190, 148 x 48hrs AIC 9.2(H)- 9/22  Nutrition-Focused physical exam completed. Findings are no fat depletion, no muscle depletion, and moderate edema in BLE. Pt noted to have red, dry, and flaky skin over her entire body that is highly concentrated around her scalp. Pt also noted to having thinning hair but per pt's daughter, this is normal for this pt.    Diet Order:  DIET SOFT Room service appropriate? Yes; Fluid consistency: Thin  Skin:  Wound (see comment) (heel- 5cm x 5cm x 0.1cm stage II ulcer with pink bed, with thick slimy yellow exudate. There is a 1cm x 2cm DTI in center of wound)  Last BM:  9/23  Height:   Ht Readings from Last 1 Encounters:  06/17/17 '5\' 5"'$  (1.651 m)    Weight:   Wt Readings from Last 1 Encounters:  06/21/17 246 lb 3.2 oz (111.7 kg)    Ideal Body Weight:  56.8 kg  BMI:  Body mass index is 40.97 kg/m.  Estimated Nutritional Needs:   Kcal:  1700-2000kcal/day   Protein:  90-112g/day   Fluid:  >1.7L/day   EDUCATION NEEDS:   Education needs no appropriate at this time  Koleen Distance MS, RD, LDN Pager #[phone] After Hours Pager: [phone]

## 2017-06-22 NOTE — Progress Notes (Signed)
ID E note No fevers, wbc down some.  UCX fu negative. Wound cx pending still   Rec Continue vanco and ceftriaxone Will base final abx recs on the wound and UCX

## 2017-06-22 NOTE — Progress Notes (Signed)
Pt became very restless and agitated along with shortness of breath. Pt was noted to have wheezing upper and lower. Pt was given PRN breathing treatment. Primary nurse notified Dr. Elpidio Anis and orders were received for one dose of ativan 0.5 mg. After Breathing treatment and Ativan given pt seemed more calm. Pt still had wheezing in lungs. Primary nurse notified RT to assess pt and to discuss possible further treatment. Primary nurse notified Dr. Elpidio Anis once more and orders were received for Lasix, Solumedrol; along with Metoprolol. Primary nurse to continue to monitor.

## 2017-06-22 NOTE — Clinical Social Work Note (Signed)
CSW continuing to follow. Patient to return to [facility] at discharge. York Spaniel MSW,LCSW [phone]

## 2017-06-22 NOTE — Progress Notes (Signed)
Inpatient Diabetes Program Recommendations  AACE/ADA: New Consensus Statement on Inpatient Glycemic Control (2015)  Target Ranges:  Prepandial:   less than 140 mg/dL      Peak postprandial:   less than 180 mg/dL (1-2 hours)      Critically ill patients:  140 - 180 mg/dL   Lab Results  Component Value Date   GLUCAP 185 (H) 06/22/2017   HGBA1C 9.2 (H) 06/17/2017    Review of Glycemic Control  Results for LATRISE, BOWLAND (MRN [id number]) as of 06/22/2017 11:17  Ref. Range 06/21/2017 08:21 06/21/2017 12:23 06/21/2017 17:18 06/21/2017 21:05 06/22/2017 08:01  Glucose-Capillary Latest Ref Range: 65 - 99 mg/dL 409 (H) 811 (H) 914 (H) 122 (H) 185 (H)    Diabetes history: Type 2 Outpatient Diabetes medications: Lantus 42 units bid, Humalog 2-12 units tid,  Current orders for Inpatient glycemic control: Lantus 15 units bid, Novolog 4 units tid, Novolog 0-9 units tid  *Solu-Medrol  q24hours  Inpatient Diabetes Program Recommendations:  While the patient is on steroids, consider increasing Novolog correction insulin to moderate correction scale 0-15 units tid  Susette Racer, RN, [place], [place], CDE Diabetes Coordinator Inpatient Diabetes Program  [phone] (Team Pager) [phone] [facility] Office) 06/22/2017 11:21 AM

## 2017-06-22 NOTE — Progress Notes (Addendum)
PT Cancellation Note  Patient Details Name: Maureen Taylor MRN: [id number] DOB: 12/26/1934   Cancelled Treatment:    Reason Eval/Treat Not Completed: Other (comment)   Session attempted this but pt with nursing.  Session held.   Danielle Dess 06/22/2017, 12:01 PM

## 2017-06-23 LAB — CBC
HCT: 31.9 % — ABNORMAL LOW (ref 35.0–47.0)
Hemoglobin: 10.6 g/dL — ABNORMAL LOW (ref 12.0–16.0)
MCH: 29.5 pg (ref 26.0–34.0)
MCHC: 33.3 g/dL (ref 32.0–36.0)
MCV: 88.4 fL (ref 80.0–100.0)
PLATELETS: 424 10*3/uL (ref 150–440)
RBC: 3.61 MIL/uL — AB (ref 3.80–5.20)
RDW: 13.9 % (ref 11.5–14.5)
WBC: 13.1 10*3/uL — AB (ref 3.6–11.0)

## 2017-06-23 LAB — BASIC METABOLIC PANEL
ANION GAP: 9 (ref 5–15)
BUN: 26 mg/dL — ABNORMAL HIGH (ref 6–20)
CO2: 25 mmol/L (ref 22–32)
Calcium: 8.5 mg/dL — ABNORMAL LOW (ref 8.9–10.3)
Chloride: 108 mmol/L (ref 101–111)
Creatinine, Ser: 0.9 mg/dL (ref 0.44–1.00)
GFR calc Af Amer: >60 mL/min (ref >60)
GFR, EST NON AFRICAN AMERICAN: 59 mL/min — AB (ref >60)
Glucose, Bld: 284 mg/dL — ABNORMAL HIGH (ref 65–99)
POTASSIUM: 4.4 mmol/L (ref 3.5–5.1)
SODIUM: 142 mmol/L (ref 135–145)

## 2017-06-23 LAB — GLUCOSE, CAPILLARY
GLUCOSE-CAPILLARY: 349 mg/dL — AB (ref 65–99)
Glucose-Capillary: 268 mg/dL — ABNORMAL HIGH (ref 65–99)
Glucose-Capillary: 395 mg/dL — ABNORMAL HIGH (ref 65–99)
Glucose-Capillary: 387 mg/dL — ABNORMAL HIGH (ref 65–99)

## 2017-06-23 LAB — AEROBIC CULTURE  (SUPERFICIAL SPECIMEN)

## 2017-06-23 LAB — AEROBIC CULTURE W GRAM STAIN (SUPERFICIAL SPECIMEN): Special Requests: NORMAL

## 2017-06-23 MED ORDER — INSULIN GLARGINE 100 UNIT/ML ~~LOC~~ SOLN
25.0000 [IU] | Freq: Two times a day (BID) | SUBCUTANEOUS | Status: DC
  Administered 2017-06-23 (×2): 25 [IU] via SUBCUTANEOUS
  Filled 2017-06-23 (×3): qty 0.25

## 2017-06-23 MED ORDER — DEXTROSE 5 % IV SOLN
100.0000 mg | Freq: Two times a day (BID) | INTRAVENOUS | Status: DC
  Administered 2017-06-23 – 2017-06-24 (×3): 100 mg via INTRAVENOUS
  Filled 2017-06-23 (×4): qty 100

## 2017-06-23 MED ORDER — TRIAMCINOLONE ACETONIDE 0.1 % EX CREA
TOPICAL_CREAM | Freq: Every day | CUTANEOUS | Status: DC
  Administered 2017-06-23 – 2017-06-24 (×2): via TOPICAL
  Filled 2017-06-23: qty 15

## 2017-06-23 NOTE — Progress Notes (Signed)
Physical Therapy Treatment Patient Details Name: Maureen Taylor MRN: [id number] DOB: 21-Oct-1934 Today's Date: 06/23/2017    History of Present Illness Pt is a 82 y.o. female presenting to hospital with AMS, lethargy, and decreased responsiveness.  Pt admitted with SIRS.  PMH includes: Parkinson's disease, DM, htn, recurrent UTI, bipolar disorder.    PT Comments    Pt presented in bed awake agreeable to attempt EOB tolerance. Performed supine to sit maxA with increased time for following commands and initiating activity. Required max A for LE placement and truncal support. In sitting pt required maxA at time total assist for maintaining midline as pt would consistently lean to R. Pt able to tolerate sitting EOB x 5 min. Pt with increased c/o LBP and requesting to return to supine. Pt required maxA x 2 to return to supine and total assist for repositioning and boosting to [facility]. Pt would continue to benefit from skilled PT to improve sitting balance/tolerance and to improve functional mobility.    Follow Up Recommendations  SNF     Equipment Recommendations  Rolling walker with 5" wheels    Recommendations for Other Services       Precautions / Restrictions Precautions Precautions: Fall Restrictions Weight Bearing Restrictions: No    Mobility  Bed Mobility Overal bed mobility: Needs Assistance Bed Mobility: Supine to Sit;Sit to Supine Rolling: Max assist   Supine to sit: Max assist Sit to supine: Max assist;+2 for physical assistance   General bed mobility comments: delayed processing follow 1 step commands  Transfers                    Ambulation/Gait                 Stairs            Wheelchair Mobility    Modified Rankin (Stroke Patients Only)       Balance Overall balance assessment: Needs assistance Sitting-balance support: Feet supported Sitting balance-Leahy Scale: Poor Sitting balance - Comments: R lean, maxA for correction  difficulty maintaining neutral Postural control: Right lateral lean                                  Cognition Arousal/Alertness: Awake/alert Behavior During Therapy: WFL for tasks assessed/performed Overall Cognitive Status: Impaired/Different from baseline                                        Exercises      General Comments General comments (skin integrity, edema, etc.): dry flaking skin, small small bleeding spots lower L back nsg aware      Pertinent Vitals/Pain Pain Assessment: Faces Faces Pain Scale: Hurts little more Pain Descriptors / Indicators: Grimacing Pain Intervention(s): Limited activity within patient's tolerance    Home Living                      Prior Function            PT Goals (current goals can now be found in the care plan section) Progress towards PT goals: Progressing toward goals    Frequency    Min 2X/week      PT Plan Current plan remains appropriate    Co-evaluation              AM-PAC PT "  6 Clicks" Daily Activity  Outcome Measure  Difficulty turning over in bed (including adjusting bedclothes, sheets and blankets)?: Unable Difficulty moving from lying on back to sitting on the side of the bed? : Unable Difficulty sitting down on and standing up from a chair with arms (e.g., wheelchair, bedside commode, etc,.)?: Unable Help needed moving to and from a bed to chair (including a wheelchair)?: Total Help needed walking in hospital room?: Total Help needed climbing 3-5 steps with a railing? : Total 6 Click Score: 6    End of Session Equipment Utilized During Treatment: Oxygen Activity Tolerance: Patient limited by fatigue Patient left: in bed;with call bell/phone within reach;with bed alarm set;with nursing/sitter in room         Time: 1200-1215 PT Time Calculation (min) (ACUTE ONLY): 15 min  Charges:  $Therapeutic Activity: 8-22 mins                     Bassel Gaskill   Chrystian Cupples, PTA 06/23/2017, 12:27 PM

## 2017-06-23 NOTE — Progress Notes (Signed)
[facility] CLINIC INFECTIOUS DISEASE PROGRESS NOTE Date of Admission:  06/17/2017     ID: Maureen Taylor is a 82 y.o. female with UTI, heel wound Principal Problem:   SIRS (systemic inflammatory response syndrome) (HCC) Active Problems:   Parkinson's disease (HCC)   UTI (urinary tract infection)   Acute encephalopathy   Pressure injury of skin   Subjective: Much more alert, eating. No fevers.   ROS  Unable to obtain   Medications:  Antibiotics Given (last 72 hours)    Date/Time Action Medication Dose Rate   06/20/17 1519 New Bag/Given   vancomycin (VANCOCIN) 1,500 mg in sodium chloride 0.9 % 500 mL IVPB 1,500 mg 250 mL/hr   06/21/17 0601 New Bag/Given   cefTRIAXone (ROCEPHIN) 1 g in dextrose 5 % 50 mL IVPB 1 g 100 mL/hr   06/21/17 0854 New Bag/Given   vancomycin (VANCOCIN) 1,250 mg in sodium chloride 0.9 % 250 mL IVPB 1,250 mg 166.7 mL/hr   06/22/17 0536 New Bag/Given   cefTRIAXone (ROCEPHIN) 1 g in dextrose 5 % 50 mL IVPB 1 g 100 mL/hr   06/22/17 1015 New Bag/Given   vancomycin (VANCOCIN) 1,250 mg in sodium chloride 0.9 % 250 mL IVPB 1,250 mg 166.7 mL/hr   06/23/17 0506 New Bag/Given   cefTRIAXone (ROCEPHIN) 1 g in dextrose 5 % 50 mL IVPB 1 g 100 mL/hr   06/23/17 0855 New Bag/Given   vancomycin (VANCOCIN) 1,250 mg in sodium chloride 0.9 % 250 mL IVPB 1,250 mg 166.7 mL/hr     . acidophilus  1 capsule Oral Daily  . ARIPiprazole  2.5 mg Oral BID  . aspirin EC  81 mg Oral Daily  . atorvastatin  20 mg Oral Daily  . carbidopa-levodopa  1 tablet Oral BID  . clotrimazole   Topical BID  . docusate sodium  100 mg Oral BID  . DULoxetine  60 mg Oral Daily  . enoxaparin (LOVENOX) injection  40 mg Subcutaneous Q24H  . famotidine  20 mg Oral BID  . feeding supplement (ENSURE ENLIVE)  237 mL Oral BID BM  . gabapentin  200 mg Oral QHS  . hydrocerin   Topical BID  . insulin aspart  0-9 Units Subcutaneous TID WC  . insulin aspart  4 Units Subcutaneous TID WC  . insulin glargine  25  Units Subcutaneous BID  . ipratropium-albuterol  3 mL Nebulization BID  . lamoTRIgine  100 mg Oral BID  . linaclotide  145 mcg Oral QAC breakfast  . lisinopril  20 mg Oral Daily  . mouth rinse  15 mL Mouth Rinse BID  . methylPREDNISolone (SOLU-MEDROL) injection  60 mg Intravenous Q24H  . metoprolol tartrate  25 mg Oral BID  . metroNIDAZOLE   Topical Once per day on Mon Wed Fri  . multivitamin  1 tablet Oral QHS  . multivitamin-lutein  1 capsule Oral Daily  . omega-3 acid ethyl esters  1 g Oral BID  . oxybutynin  5 mg Oral BID  . pantoprazole  40 mg Oral BID  . polyethylene glycol  17 g Oral Daily  . triamcinolone cream   Topical Daily    Objective: Vital signs in last 24 hours: Temp:  [97.7 F (36.5 C)-98.5 F (36.9 C)] 97.7 F (36.5 C) (09/28 1253) Pulse Rate:  [80-92] 82 (09/28 1253) Resp:  [16-20] 16 (09/28 1253) BP: (109-143)/(52-76) 141/76 (09/28 1253) SpO2:  [93 %-100 %] 100 % (09/28 1253) Constitutional: demeted, lying in bed HENT: [place]/AT, PERRLA, no scleral icterus  Mouth/Throat: Oropharynx is clear and moist. No oropharyngeal exudate.  Cardiovascular: Normal rate, regular rhythm and normal heart sounds. \ Pulmonary/Chest: Effort normal and breath sounds normal. No respiratory distress.  has no wheezes.  Neck = supple, no nuchal rigidity Abdominal: Soft. Bowel sounds are normal.  exhibits no distension. There is no tenderness.  Lymphadenopathy: no cervical adenopathy. No axillary adenopathy Neurological: lethragic, Skin: diffuse scaly eruption R heel ulcer dendued blister Psychiatric: unable to assess Lab Results  Recent Labs  06/21/17 0652 06/22/17 0533 06/23/17 0514  WBC 18.4* 16.2* 13.1*  HGB 10.9* 11.6* 10.6*  HCT 32.9* 35.3 31.9*  NA 142  --  142  K 4.3  --  4.4  CL 110  --  108  CO2 22  --  25  BUN 16  --  26*  CREATININE 0.99  --  0.90    Microbiology: Results for orders placed or performed during the hospital encounter of 06/17/17  Blood  culture (routine x 2)     Status: None   Collection Time: 06/17/17 12:10 PM  Result Value Ref Range Status   Specimen Description BLOOD RT HA  Final   Special Requests   Final    BOTTLES DRAWN AEROBIC AND ANAEROBIC Blood Culture results may not be optimal due to an inadequate volume of blood received in culture bottles   Culture NO GROWTH 5 DAYS  Final   Report Status 06/22/2017 FINAL  Final  Urine Culture     Status: Abnormal   Collection Time: 06/17/17 12:13 PM  Result Value Ref Range Status   Specimen Description URINE, RANDOM  Final   Special Requests NONE  Final   Culture MULTIPLE SPECIES PRESENT, SUGGEST RECOLLECTION (A)  Final   Report Status 06/18/2017 FINAL  Final  Blood culture (routine x 2)     Status: None   Collection Time: 06/17/17  2:17 PM  Result Value Ref Range Status   Specimen Description BLOOD LT HAND  Final   Special Requests   Final    BOTTLES DRAWN AEROBIC AND ANAEROBIC Blood Culture adequate volume   Culture NO GROWTH 5 DAYS  Final   Report Status 06/22/2017 FINAL  Final  MRSA PCR Screening     Status: Abnormal   Collection Time: 06/17/17  6:15 PM  Result Value Ref Range Status   MRSA by PCR POSITIVE (A) NEGATIVE Final    Comment:        The GeneXpert MRSA Assay (FDA approved for NASAL specimens only), is one component of a comprehensive MRSA colonization surveillance program. It is not intended to diagnose MRSA infection nor to guide or monitor treatment for MRSA infections. RESULT CALLED TO, READ BACK BY AND VERIFIED WITH: CALLED KELLY ZIELKE AT 1945 ON 06/17/17 RWW   Urine Culture     Status: None   Collection Time: 06/20/17  2:50 PM  Result Value Ref Range Status   Specimen Description URINE, CATHETERIZED  Final   Special Requests NONE  Final   Culture   Final    NO GROWTH Performed at [facility], [address] [place], [place] [address]    Report Status 06/22/2017 FINAL  Final  Aerobic Culture (superficial specimen)     Status:  None   Collection Time: 06/20/17  3:29 PM  Result Value Ref Range Status   Specimen Description HEEL  Final   Special Requests Normal  Final   Gram Stain   Final    MODERATE WBC PRESENT,BOTH PMN AND  MONONUCLEAR FEW GRAM VARIABLE ROD RARE GRAM POSITIVE COCCI Performed at [facility], [address] [place], [place] [address]    Culture FEW METHICILLIN RESISTANT STAPHYLOCOCCUS AUREUS  Final   Report Status 06/23/2017 FINAL  Final   Organism ID, Bacteria METHICILLIN RESISTANT STAPHYLOCOCCUS AUREUS  Final      Susceptibility   Methicillin resistant staphylococcus aureus - MIC*    CIPROFLOXACIN >=8 RESISTANT Resistant     ERYTHROMYCIN >=8 RESISTANT Resistant     GENTAMICIN <=0.5 SENSITIVE Sensitive     OXACILLIN >=4 RESISTANT Resistant     TETRACYCLINE <=1 SENSITIVE Sensitive     VANCOMYCIN <=0.5 SENSITIVE Sensitive     TRIMETH/SULFA <=10 SENSITIVE Sensitive     CLINDAMYCIN <=0.25 SENSITIVE Sensitive     RIFAMPIN <=0.5 SENSITIVE Sensitive     Inducible Clindamycin NEGATIVE Sensitive     * FEW METHICILLIN RESISTANT STAPHYLOCOCCUS AUREUS    Studies/Results: No results found.  Assessment/Plan: Maureen Taylor is a 82 y.o. female with PD and dementia as well as recurrent UTIs admitted with AMS. She has evidnence UTI but ucx mixed initially. Has a foley now and repeat UA shows TNTC WBC, ucx fu negative. Wound cx on heel MRSA. WBC improving but remains lethargic  Recommendations Cont ceftriaxone while inpatient as her ucx is negative with this Change vanco to doxy for the heel for a total 14 day course Can dc on oral doxy 100 bid for 14 days I can see in fu in 2 weeks if needed.   Thank you very much for the consult. Will follow with you.  Mick Sell   06/23/2017, 1:56 PM

## 2017-06-23 NOTE — Care Management Important Message (Signed)
Important Message  Patient Details  Name: Maureen Taylor MRN: [id number] Date of Birth: [birth year]   Medicare Important Message Given:  Yes    Chapman Fitch, RN 06/23/2017, 10:54 AM

## 2017-06-23 NOTE — Consult Note (Signed)
Consultation Note Date: 06/23/2017   Patient Name: Maureen Taylor  DOB: [birth year]  MRN: [id number]  Age / Sex: 82 y.o., female  PCP: Oneal Grout, MD Referring Physician: Altamese Dilling, *  Reason for Consultation: Establishing goals of care, code status  HPI/Patient Profile:  DEMIYAH Taylor is a 82 y.o. female has a past medical history significant for Parkinson's, HTN, Bipolar D/O, and recurrent UTI's who presents from SNF with unresponsiveness and lethargy.    Clinical Assessment and Goals of Care: Ms. Maureen Taylor has a friend at bedside. She and he state she is doing better today. She is alert and oriented, conversing well and joking. She states she was told she should be discharged tomorrow and is very happy about this. She was admitted for UTI which she has had frequent episodes. She will be discharged with a foley with follow up in 4 weeks with urology to determine continuation of the catheter.   We discussed code status and her friend stated he would just want to go when it's his time, to which she stated "not me, I'm selfish" and stated she had a granddaughter that she wants to see grow up and get married. She states she wants anything done to live to see her grandchild grow up.    She states she has 2 daughters and several grandchildren including one granddaughter.   She states her daughter Maureen Taylor is her Management consultant and takes care of things. I spoke with [name] and she confirms code status.      SUMMARY OF RECOMMENDATIONS   Continue treatment and discharge back to SNF when medically appropriate.    Code Status/Advance Care Planning:  Full code    Prognosis:   Unable to determine Depends on recurrent UTI  Discharge Planning: Return to SNF     Primary Diagnoses: Present on Admission: . UTI (urinary tract infection) . Parkinson's disease (HCC) . Acute encephalopathy   I  have reviewed the medical record, interviewed the patient and family, and examined the patient. The following aspects are pertinent.  Past Medical History:  Diagnosis Date  . Acute encephalopathy   . Allergic rhinitis   . Ascorbic acid deficiency   . Bipolar affective disorder (HCC)   . Candidiasis   . Charcot's joint   . Chronic constipation   . Dysuria   . GERD (gastroesophageal reflux disease)   . History of falling   . HLD (hyperlipidemia)   . HTN (hypertension)   . Magnesium deficiency   . Major depressive disorder   . Mixed incontinence   . Morbid obesity (HCC)   . Muscle weakness   . Neuropathic pain   . Neuropathy   . OAB (overactive bladder)   . Obesity   . Oral pain   . Parkinson disease (HCC)   . Pneumonia   . Protein calorie malnutrition (HCC)   . Rosacea   . Sepsis (HCC) 09/2016  . Slow transit constipation   . Type 2 diabetes mellitus with diabetic polyneuropathy, with long-term current use of  insulin (HCC)   . Urinary tract infection without hematuria   . Vaginal atrophy   . Vitamin deficiency    Social History   Social History  . Marital status: Widowed    Spouse name: N/A  . Number of children: N/A  . Years of education: N/A   Social History Main Topics  . Smoking status: Never Smoker  . Smokeless tobacco: Never Used  . Alcohol use No  . Drug use: No  . Sexual activity: No   Other Topics Concern  . None   Social History Narrative  . None   Family History  Problem Relation Age of Onset  . Colon cancer Father   . Colon cancer Sister   . Stomach cancer Sister   . Rectal cancer Sister   . Kidney disease Neg Hx   . Bladder Cancer Neg Hx   . Prostate cancer Neg Hx    Scheduled Meds: . acidophilus  1 capsule Oral Daily  . ARIPiprazole  2.5 mg Oral BID  . aspirin EC  81 mg Oral Daily  . atorvastatin  20 mg Oral Daily  . carbidopa-levodopa  1 tablet Oral BID  . clotrimazole   Topical BID  . docusate sodium  100 mg Oral BID  .  DULoxetine  60 mg Oral Daily  . enoxaparin (LOVENOX) injection  40 mg Subcutaneous Q24H  . famotidine  20 mg Oral BID  . feeding supplement (ENSURE ENLIVE)  237 mL Oral BID BM  . gabapentin  200 mg Oral QHS  . hydrocerin   Topical BID  . insulin aspart  0-9 Units Subcutaneous TID WC  . insulin aspart  4 Units Subcutaneous TID WC  . insulin glargine  25 Units Subcutaneous BID  . ipratropium-albuterol  3 mL Nebulization BID  . lamoTRIgine  100 mg Oral BID  . linaclotide  145 mcg Oral QAC breakfast  . lisinopril  20 mg Oral Daily  . mouth rinse  15 mL Mouth Rinse BID  . methylPREDNISolone (SOLU-MEDROL) injection  60 mg Intravenous Q24H  . metoprolol tartrate  25 mg Oral BID  . metroNIDAZOLE   Topical Once per day on Mon Wed Fri  . multivitamin  1 tablet Oral QHS  . multivitamin-lutein  1 capsule Oral Daily  . omega-3 acid ethyl esters  1 g Oral BID  . oxybutynin  5 mg Oral BID  . pantoprazole  40 mg Oral BID  . polyethylene glycol  17 g Oral Daily  . triamcinolone cream   Topical Daily   Continuous Infusions: . cefTRIAXone (ROCEPHIN)  IV Stopped (06/23/17 0536)  . doxycycline (VIBRAMYCIN) IV     PRN Meds:.acetaminophen **OR** acetaminophen, bisacodyl, diphenhydrAMINE, hydrOXYzine, ipratropium-albuterol, LORazepam, ondansetron **OR** ondansetron (ZOFRAN) IV, polyvinyl alcohol Medications Prior to Admission:  Prior to Admission medications   Medication Sig Start Date End Date Taking? Authorizing Provider  alum & mag hydroxide-simeth (MAALOX/MYLANTA) 200-200-20 MG/5ML suspension Take 30 mLs by mouth every 6 (six) hours as needed for indigestion or heartburn. 10/09/16  Yes Hongalgi, Maximino Greenland, MD  ARIPiprazole (ABILIFY) 5 MG tablet Take 2.5 mg by mouth 2 (two) times daily.    Yes [provider]  aspirin 81 MG EC tablet Take 81 mg by mouth daily. Swallow whole.   Yes [provider]  atorvastatin (LIPITOR) 20 MG tablet Take 20 mg by mouth daily.    Yes [provider]  carbidopa-levodopa (PARCOPA) 25-100 MG disintegrating tablet Take 1 tablet by mouth 2 (two) times daily.  Yes [provider]  carboxymethylcellulose 1 % ophthalmic solution Apply 1 drop to eye 3 (three) times daily.    Yes [provider]  conjugated estrogens (PREMARIN) vaginal cream Place 1 Applicatorful vaginally every Monday, Wednesday, and Friday. Apply 0.5mg  (pea-sized amount)  just inside the vaginal introitus with a finger-tip every Monday, Wednesday and Friday nights. 10/10/16  Yes Hongalgi, Maximino Greenland, MD  Cranberry 425 MG CAPS Take 1 capsule by mouth daily.    Yes [provider]  DULoxetine (CYMBALTA) 60 MG capsule Take 60 mg by mouth daily.   Yes [provider]  gabapentin (NEURONTIN) 100 MG capsule Take 200 mg by mouth at bedtime.    Yes [provider]  hydrOXYzine (ATARAX/VISTARIL) 25 MG tablet Take 25 mg by mouth 3 (three) times daily.   Yes [provider]  insulin glargine (LANTUS) 100 UNIT/ML injection Inject 42 Units into the skin 2 (two) times daily.    Yes [provider]  insulin lispro (HUMALOG) 100 UNIT/ML injection Inject 2-12 Units into the skin See admin instructions. Sliding Scale   Yes [provider]  lamoTRIgine (LAMICTAL) 100 MG tablet Take 100 mg by mouth 2 (two) times daily.    Yes [provider]  linaclotide (LINZESS) 145 MCG CAPS capsule Take 145 mcg by mouth daily before breakfast.   Yes [provider]  lisinopril (PRINIVIL,ZESTRIL) 20 MG tablet Take 20 mg by mouth daily.    Yes [provider]  loratadine (CLARITIN) 10 MG tablet Take 10 mg by mouth daily.   Yes [provider]  Magnesium 400 MG CAPS Take 1 capsule by mouth daily.   Yes [provider]  metoprolol tartrate (LOPRESSOR) 25 MG tablet Take 25 mg by mouth 2 (two) times daily. Take 25 mg by mouth twice a day for HTN   Yes [provider]  METRONIDAZOLE,  TOPICAL, (METROLOTION) 0.75 % LOTN Apply 1 application topically every Monday, Wednesday, and Friday.    Yes [provider]  multivitamin-lutein (OCUVITE-LUTEIN) CAPS capsule Take 1 capsule by mouth daily.   Yes [provider]  oxybutynin (DITROPAN) 5 MG tablet Take 5 mg by mouth 2 (two) times daily.    Yes [provider]  pantoprazole (PROTONIX) 40 MG tablet Take 40 mg by mouth daily.    Yes [provider]  polyethylene glycol (MIRALAX / GLYCOLAX) packet Take 17 g by mouth 2 (two) times daily.   Yes [provider]  sennosides-docusate sodium (SENOKOT-S) 8.6-50 MG tablet Take 2 tablets by mouth 2 (two) times daily.   Yes [provider]  amLODipine (NORVASC) 5 MG tablet Take 1 tablet (5 mg total) by mouth daily. Patient not taking: Reported on 06/17/2017 10/10/16   Elease Etienne, MD   Allergies  Allergen Reactions  . Codeine Itching  . Morphine And Related Itching  . Indocin [Indomethacin] Rash   Review of Systems  Constitutional:       Feeling better today.     Physical Exam  Constitutional: She is oriented to person, place, and time. She appears well-nourished.  HENT:  Head: Normocephalic.  Eyes: EOM are normal.  Cardiovascular:  Warm and dry  Pulmonary/Chest: Effort normal.  Neurological: She is alert and oriented to person, place, and time.  Skin: Rash noted.    Vital Signs: BP (!) 141/76 (BP Location: Left Arm)   Pulse 82   Temp 97.7 F (36.5 C) (Oral)   Resp 16   Ht  (  1.651 m)   Wt 111.7 kg (246 lb 3.2 oz)   SpO2 100%   BMI 40.97 kg/m  Pain Assessment: No/denies pain POSS *See Group Information*: 1-Acceptable,Awake and alert Pain Score: 0-No pain   SpO2: SpO2: 100 % O2 Device:SpO2: 100 % O2 Flow Rate: .O2 Flow Rate (L/min): 2 L/min  IO: Intake/output summary:  Intake/Output Summary (Last 24 hours) at 06/23/17 1433 Last data filed at 06/23/17 1259  Gross per 24 hour  Intake             1120  ml  Output              750 ml  Net              370 ml    LBM: Last BM Date: 06/21/17 Baseline Weight: Weight: 104.3 kg (230 lb) Most recent weight: Weight: 111.7 kg (246 lb 3.2 oz)     Palliative Assessment/Data: 40%     Time In: 1:30 Time Out: 2:50 Time Total: 1 hour 20 minutes Greater than 50%  of this time was spent counseling and coordinating care related to the above assessment and plan.  Signed by: Morton Stall, NP 06/23/2017 2:52 PM Office: [phone] 7am-7pm  Call primary team after hours  Please contact Palliative Medicine Team phone at [phone] for questions and concerns.  For individual provider: See Loretha Stapler

## 2017-06-23 NOTE — Progress Notes (Signed)
Inpatient Diabetes Program Recommendations  AACE/ADA: New Consensus Statement on Inpatient Glycemic Control (2015)  Target Ranges:  Prepandial:   less than 140 mg/dL      Peak postprandial:   less than 180 mg/dL (1-2 hours)      Critically ill patients:  140 - 180 mg/dL   Results for Maureen Taylor, Maureen Taylor (MRN [id number]) as of 06/23/2017 08:29  Ref. Range 06/22/2017 08:01 06/22/2017 12:00 06/22/2017 16:33 06/22/2017 21:14  Glucose-Capillary Latest Ref Range: 65 - 99 mg/dL 956 (H)    6 units Novolog 251 (H)  9 units Novolog 304 (H)  11 units Novolog 321 (H)   Results for Maureen Taylor, Maureen Taylor (MRN [id number]) as of 06/23/2017 08:29  Ref. Range 06/23/2017 08:02  Glucose-Capillary Latest Ref Range: 65 - 99 mg/dL 469 (H)    SNF DM Meds: Lantus 42 units BID      Humalog 2-12 units per SSI  Current Orders: Lantus 15 units BID       Novolog Sensitive Correction Scale/ SSI (0-9 units) TID AC       Novolog 4 units TID with meals     MD- Note patient started on Solumedrol 60 mg daily 06/27.  Glucose levels now quite elevated likely due to steroids.  Please consider the following in-hospital insulin adjustments:  1. Increase Lantus to 20 units BID (50% home dose)  2. Increase Novolog Meal Coverage to: Novolog 6 units TID with meals (hold if pt eats <50% of meal)     --Will follow patient during hospitalization--  Ambrose Finland RN, MSN, CDE Diabetes Coordinator Inpatient Glycemic Control Team Team Pager: [phone] (8a-5p)

## 2017-06-24 LAB — GLUCOSE, CAPILLARY
GLUCOSE-CAPILLARY: 330 mg/dL — AB (ref 65–99)
Glucose-Capillary: 383 mg/dL — ABNORMAL HIGH (ref 65–99)

## 2017-06-24 LAB — CREATININE, SERUM
CREATININE: 0.9 mg/dL (ref 0.44–1.00)
GFR, EST NON AFRICAN AMERICAN: 59 mL/min — AB (ref >60)

## 2017-06-24 MED ORDER — OXYBUTYNIN CHLORIDE 5 MG PO TABS: 5.0000 mg | ORAL_TABLET | Freq: Three times a day (TID) | ORAL | Status: DC | PRN

## 2017-06-24 MED ORDER — PREDNISONE 10 MG (21) PO TBPK: ORAL_TABLET | ORAL | 0 refills | Status: AC

## 2017-06-24 MED ORDER — ENSURE ENLIVE PO LIQD: 237.0000 mL | Freq: Two times a day (BID) | ORAL | 12 refills | Status: AC

## 2017-06-24 MED ORDER — IPRATROPIUM-ALBUTEROL 0.5-2.5 (3) MG/3ML IN SOLN: 3.0000 mL | RESPIRATORY_TRACT | 0 refills | Status: AC | PRN

## 2017-06-24 MED ORDER — TRIAMCINOLONE ACETONIDE 0.1 % EX CREA: TOPICAL_CREAM | Freq: Every day | CUTANEOUS | 0 refills | Status: DC

## 2017-06-24 MED ORDER — INSULIN GLARGINE 100 UNIT/ML ~~LOC~~ SOLN
35.0000 [IU] | Freq: Two times a day (BID) | SUBCUTANEOUS | Status: DC
  Administered 2017-06-24: 35 [IU] via SUBCUTANEOUS
  Filled 2017-06-24 (×2): qty 0.35

## 2017-06-24 MED ORDER — DOXYCYCLINE HYCLATE 100 MG PO TABS: 100.0000 mg | ORAL_TABLET | Freq: Two times a day (BID) | ORAL | 0 refills | Status: AC

## 2017-06-24 MED ORDER — BISACODYL 10 MG RE SUPP
10.0000 mg | Freq: Every day | RECTAL | 0 refills | Status: AC | PRN
Start: 2017-06-24 — End: ?

## 2017-06-24 MED ORDER — HYDROXYZINE HCL 25 MG PO TABS: 25.0000 mg | ORAL_TABLET | Freq: Two times a day (BID) | ORAL | 0 refills | Status: DC | PRN

## 2017-06-24 MED ORDER — DOCUSATE SODIUM 100 MG PO CAPS: 100.0000 mg | ORAL_CAPSULE | Freq: Two times a day (BID) | ORAL | 0 refills | Status: AC

## 2017-06-24 MED ORDER — INSULIN GLARGINE 100 UNIT/ML ~~LOC~~ SOLN: 35.0000 [IU] | Freq: Two times a day (BID) | SUBCUTANEOUS | 1 refills | Status: AC

## 2017-06-24 NOTE — Progress Notes (Signed)
Pt prepared for d/c to SNF. IV d/c'd. Skin intact except as charted in most recent assessments. Vitals are stable. Report called to receiving facility. Discharge paperwork gone over with daughter. Dressing changes were done prior to discharge. Cream sent with daughter to give receiving facility. Pt to be transported by ambulance service.   Jameriah Trotti [employer]

## 2017-06-24 NOTE — Discharge Instructions (Signed)
Foley care as routine. Apply cream to whole body- daily. Follow in Dermatology clinic in 1 week- to discuss the biopsy results. Follow in urology clinic in 1 month, Until then keep the catheter in. To right heel, cleanse with NS, pat dry, apply Xeroform, cover with dry gauze, wrap with kerlix or kling, adhere with tape, perform daily

## 2017-06-24 NOTE — Discharge Summary (Signed)
[facility] - [place] at [facility]   PATIENT NAME: Maureen Taylor    MR#:  [id number]  DATE OF BIRTH:  24-Dec-1934  DATE OF ADMISSION:  06/17/2017 ADMITTING PHYSICIAN: Marguarite Arbour, MD  DATE OF DISCHARGE: 06/24/2017   PRIMARY CARE PHYSICIAN: Oneal Grout, MD    ADMISSION DIAGNOSIS:  Encephalopathy [G93.40] Abdominal pain [R10.9] Sepsis, due to unspecified organism (HCC) [A41.9]  DISCHARGE DIAGNOSIS:  Principal Problem:   SIRS (systemic inflammatory response syndrome) (HCC) Active Problems:   Parkinson's disease (HCC)   UTI (urinary tract infection)   Acute encephalopathy   Pressure injury of skin   Cellulitis  SECONDARY DIAGNOSIS:   Past Medical History:  Diagnosis Date  . Acute encephalopathy   . Allergic rhinitis   . Ascorbic acid deficiency   . Bipolar affective disorder (HCC)   . Candidiasis   . Charcot's joint   . Chronic constipation   . Dysuria   . GERD (gastroesophageal reflux disease)   . History of falling   . HLD (hyperlipidemia)   . HTN (hypertension)   . Magnesium deficiency   . Major depressive disorder   . Mixed incontinence   . Morbid obesity (HCC)   . Muscle weakness   . Neuropathic pain   . Neuropathy   . OAB (overactive bladder)   . Obesity   . Oral pain   . Parkinson disease (HCC)   . Pneumonia   . Protein calorie malnutrition (HCC)   . Rosacea   . Sepsis (HCC) 09/2016  . Slow transit constipation   . Type 2 diabetes mellitus with diabetic polyneuropathy, with long-term current use of insulin (HCC)   . Urinary tract infection without hematuria   . Vaginal atrophy   . Vitamin deficiency     HOSPITAL COURSE:   * Sepsis, Acute metabolic encephalopathy   UTI, cellulitis     IV Abx, cx sent- report multiple species.   Appreciated help of Urology   Foley and may need proper treatment of constipation.   As WBCs keep rising and pt remains confused, Called ID consult, he suggest to send Ur cx again  from Foley. Sent. Negative. Treated for 7 days with rocephin IV in hospital.   Much improved now, need to keep foley in for 1 mth as per urology.  * Cellulitis on legs, Right heel ulcer    With her skin changes and elevating WBCs,    MRSA positive, so give Vanc Iv.   Appreciated help by ID, sent Cx from heel ulcer.   As MRSA in cx, suggest to treat with Doxy for 14 days total.  * DM   COnt Lantus and ISS   Now more awake, and good oral intake, so blood sugar rising, increased lantus.  * Hypertension    Cont metoprolol.  * Skin disease   For last few weeks, have skin redness and flaking as per daughter.    She is advised to go to Dermatologist for biopsy asa per daughter.    Called dermatology consult here.    Did skin biopsy and requested triamcinolone cream on whole body daily.   Follow in office to discuss result of biopsy in 1 week.  * Parkinson's disease   Cont home meds  * Wheezing   Added duoneb.   Xray chest clear.  will give tapering steroids on discharge.  DISCHARGE CONDITIONS:   Stable.  CONSULTS OBTAINED:  Treatment Team:  Pauletta Browns, MD Crist Fat, MD Mick Sell, MD  DRUG ALLERGIES:   Allergies  Allergen Reactions  . Codeine Itching  . Morphine And Related Itching  . Indocin [Indomethacin] Rash    DISCHARGE MEDICATIONS:   Current Discharge Medication List    START taking these medications   Details  bisacodyl (DULCOLAX) 10 MG suppository Place 1 suppository (10 mg total) rectally daily as needed for moderate constipation. Qty: 12 suppository, Refills: 0    docusate sodium (COLACE) 100 MG capsule Take 1 capsule (100 mg total) by mouth 2 (two) times daily. Qty: 10 capsule, Refills: 0    doxycycline (VIBRA-TABS) 100 MG tablet Take 1 tablet (100 mg total) by mouth 2 (two) times daily. Qty: 28 tablet, Refills: 0    feeding supplement, ENSURE ENLIVE, (ENSURE ENLIVE) LIQD Take 237 mLs by mouth 2 (two) times daily  between meals. Qty: 237 mL, Refills: 12    ipratropium-albuterol (DUONEB) 0.5-2.5 (3) MG/3ML SOLN Take 3 mLs by nebulization every 4 (four) hours as needed (wheezing). Qty: 360 mL, Refills: 0    predniSONE (STERAPRED UNI-PAK 21 TAB) 10 MG (21) TBPK tablet Take 6 tabs first day, 5 tab on day 2, then 4 on day 3rd, 3 tabs on day 4th , 2 tab on day 5th, and 1 tab on 6th day. Qty: 21 tablet, Refills: 0    triamcinolone cream (KENALOG) 0.1 % Apply topically daily. To whole body Qty: 30 g, Refills: 0      CONTINUE these medications which have CHANGED   Details  hydrOXYzine (ATARAX/VISTARIL) 25 MG tablet Take 1 tablet (25 mg total) by mouth 2 (two) times daily as needed for anxiety. Qty: 30 tablet, Refills: 0    insulin glargine (LANTUS) 100 UNIT/ML injection Inject 0.35 mLs (35 Units total) into the skin 2 (two) times daily. Qty: 10 mL, Refills: 1      CONTINUE these medications which have NOT CHANGED   Details  alum & mag hydroxide-simeth (MAALOX/MYLANTA) 200-200-20 MG/5ML suspension Take 30 mLs by mouth every 6 (six) hours as needed for indigestion or heartburn.    ARIPiprazole (ABILIFY) 5 MG tablet Take 2.5 mg by mouth 2 (two) times daily.     aspirin 81 MG EC tablet Take 81 mg by mouth daily. Swallow whole.    atorvastatin (LIPITOR) 20 MG tablet Take 20 mg by mouth daily.     carbidopa-levodopa (PARCOPA) 25-100 MG disintegrating tablet Take 1 tablet by mouth 2 (two) times daily.     carboxymethylcellulose 1 % ophthalmic solution Apply 1 drop to eye 3 (three) times daily.     conjugated estrogens (PREMARIN) vaginal cream Place 1 Applicatorful vaginally every Monday, Wednesday, and Friday. Apply 0.5mg  (pea-sized amount)  just inside the vaginal introitus with a finger-tip every Monday, Wednesday and Friday nights.   Associated Diagnoses: Vaginal atrophy    Cranberry 425 MG CAPS Take 1 capsule by mouth daily.     DULoxetine (CYMBALTA) 60 MG capsule Take 60 mg by mouth daily.     gabapentin (NEURONTIN) 100 MG capsule Take 200 mg by mouth at bedtime.     insulin lispro (HUMALOG) 100 UNIT/ML injection Inject 2-12 Units into the skin See admin instructions. Sliding Scale    lamoTRIgine (LAMICTAL) 100 MG tablet Take 100 mg by mouth 2 (two) times daily.     linaclotide (LINZESS) 145 MCG CAPS capsule Take 145 mcg by mouth daily before breakfast.    lisinopril (PRINIVIL,ZESTRIL) 20 MG tablet Take 20 mg by mouth daily.     Magnesium 400 MG CAPS Take 1  capsule by mouth daily.    metoprolol tartrate (LOPRESSOR) 25 MG tablet Take 25 mg by mouth 2 (two) times daily. Take 25 mg by mouth twice a day for HTN    METRONIDAZOLE, TOPICAL, (METROLOTION) 0.75 % LOTN Apply 1 application topically every Monday, Wednesday, and Friday.     multivitamin-lutein (OCUVITE-LUTEIN) CAPS capsule Take 1 capsule by mouth daily.    oxybutynin (DITROPAN) 5 MG tablet Take 5 mg by mouth 2 (two) times daily.     pantoprazole (PROTONIX) 40 MG tablet Take 40 mg by mouth daily.     polyethylene glycol (MIRALAX / GLYCOLAX) packet Take 17 g by mouth 2 (two) times daily.    sennosides-docusate sodium (SENOKOT-S) 8.6-50 MG tablet Take 2 tablets by mouth 2 (two) times daily.      STOP taking these medications     amoxicillin-clavulanate (AUGMENTIN) 875-125 MG tablet      loratadine (CLARITIN) 10 MG tablet      amLODipine (NORVASC) 5 MG tablet          DISCHARGE INSTRUCTIONS:    Foley care as routine. Apply cream to whole body- daily. Follow in Dermatology clinic in 1 week- to discuss the biopsy results. Follow in urology clinic in 1 month, Until then keep the catheter in. To right heel, cleanse with NS, pat dry, apply Xeroform, cover with dry gauze, wrap with kerlix or kling, adhere with tape, perform daily   If you experience worsening of your admission symptoms, develop shortness of breath, life threatening emergency, suicidal or homicidal thoughts you must seek medical attention  immediately by calling 911 or calling your MD immediately  if symptoms less severe.  You Must read complete instructions/literature along with all the possible adverse reactions/side effects for all the Medicines you take and that have been prescribed to you. Take any new Medicines after you have completely understood and accept all the possible adverse reactions/side effects.   Please note  You were cared for by a hospitalist during your hospital stay. If you have any questions about your discharge medications or the care you received while you were in the hospital after you are discharged, you can call the unit and asked to speak with the hospitalist on call if the hospitalist that took care of you is not available. Once you are discharged, your primary care physician will handle any further medical issues. Please note that NO REFILLS for any discharge medications will be authorized once you are discharged, as it is imperative that you return to your primary care physician (or establish a relationship with a primary care physician if you do not have one) for your aftercare needs so that they can reassess your need for medications and monitor your lab values.    Today   CHIEF COMPLAINT:   Chief Complaint  Patient presents with  . Altered Mental Status    HISTORY OF PRESENT ILLNESS:  Maureen Taylor  is a 82 y.o. female with a known history of Parkinson's, HTN, Bipolar D/O, and recurrent UTI's who presents from SNF with unresponsiveness and lethargy. Found to be hypotensive with elevated lactic acid in ER with repeat UTI, c/w SIRS and possible sepsis. She is now admitted. The pt c/o being cold and some SOB. No N/V. Denies CP or abdominal pain    VITAL SIGNS:  Blood pressure (!) 151/75, pulse 87, temperature 98.2 F (36.8 C), temperature source Axillary, resp. rate 18, height 5\' 5"  (1.651 m), weight 108.9 kg (240 lb), SpO2 100 %.  I/O:   Intake/Output Summary (Last 24 hours) at 06/24/17  1235 Last data filed at 06/24/17 0530  Gross per 24 hour  Intake              415 ml  Output              450 ml  Net              -35 ml    PHYSICAL EXAMINATION:   GENERAL:  82 y.o.-year-old patient lying in the bed with no acute distress.  EYES: Pupils equal, round, reactive to light and accommodation. No scleral icterus. Extraocular muscles intact.  HEENT: Head atraumatic, normocephalic. Oropharynx and nasopharynx clear.  NECK:  Supple, no jugular venous distention. No thyroid enlargement, no tenderness.  LUNGS: Normal breath sounds bilaterally, no wheezing, rales,rhonchi or crepitation. No use of accessory muscles of respiration.  CARDIOVASCULAR: S1, S2 normal. No murmurs, rubs, or gallops.  ABDOMEN: Soft, nontender, nondistended. Bowel sounds present. No organomegaly or mass.  EXTREMITIES: both legs now less skin flaking and redness. No edema. Right heel ulcer NEUROLOGIC: Alert, Follows commands, generalized weakness. Sensation intact. PSYCHIATRIC: The patient is alert and oriented X3.  SKIN: all over the body- redness and fine flaking present.   DATA REVIEW:   CBC  Recent Labs Lab 06/23/17 0514  WBC 13.1*  HGB 10.6*  HCT 31.9*  PLT 424    Chemistries   Recent Labs Lab 06/18/17 0343  06/23/17 0514 06/24/17 0531  NA 144  < > 142  --   K 4.3  < > 4.4  --   CL 114*  < > 108  --   CO2 25  < > 25  --   GLUCOSE 153*  < > 284*  --   BUN 21*  < > 26*  --   CREATININE 1.20*  < > 0.90 0.90  CALCIUM 7.9*  < > 8.5*  --   AST 35  --   --   --   ALT 5*  --   --   --   ALKPHOS 99  --   --   --   BILITOT 0.4  --   --   --   < > = values in this interval not displayed.  Cardiac Enzymes No results for input(s): TROPONINI in the last 168 hours.  Microbiology Results  Results for orders placed or performed during the hospital encounter of 06/17/17  Blood culture (routine x 2)     Status: None   Collection Time: 06/17/17 12:10 PM  Result Value Ref Range Status    Specimen Description BLOOD RT HA  Final   Special Requests   Final    BOTTLES DRAWN AEROBIC AND ANAEROBIC Blood Culture results may not be optimal due to an inadequate volume of blood received in culture bottles   Culture NO GROWTH 5 DAYS  Final   Report Status 06/22/2017 FINAL  Final  Urine Culture     Status: Abnormal   Collection Time: 06/17/17 12:13 PM  Result Value Ref Range Status   Specimen Description URINE, RANDOM  Final   Special Requests NONE  Final   Culture MULTIPLE SPECIES PRESENT, SUGGEST RECOLLECTION (A)  Final   Report Status 06/18/2017 FINAL  Final  Blood culture (routine x 2)     Status: None   Collection Time: 06/17/17  2:17 PM  Result Value Ref Range Status   Specimen Description BLOOD LT HAND  Final   Special Requests  Final    BOTTLES DRAWN AEROBIC AND ANAEROBIC Blood Culture adequate volume   Culture NO GROWTH 5 DAYS  Final   Report Status 06/22/2017 FINAL  Final  MRSA PCR Screening     Status: Abnormal   Collection Time: 06/17/17  6:15 PM  Result Value Ref Range Status   MRSA by PCR POSITIVE (A) NEGATIVE Final    Comment:        The GeneXpert MRSA Assay (FDA approved for NASAL specimens only), is one component of a comprehensive MRSA colonization surveillance program. It is not intended to diagnose MRSA infection nor to guide or monitor treatment for MRSA infections. RESULT CALLED TO, READ BACK BY AND VERIFIED WITH: CALLED KELLY ZIELKE AT 1945 ON 06/17/17 RWW   Urine Culture     Status: None   Collection Time: 06/20/17  2:50 PM  Result Value Ref Range Status   Specimen Description URINE, CATHETERIZED  Final   Special Requests NONE  Final   Culture   Final    NO GROWTH Performed at [facility], [address] [place], [place] [address]    Report Status 06/22/2017 FINAL  Final  Aerobic Culture (superficial specimen)     Status: None   Collection Time: 06/20/17  3:29 PM  Result Value Ref Range Status   Specimen Description HEEL  Final    Special Requests Normal  Final   Gram Stain   Final    MODERATE WBC PRESENT,BOTH PMN AND MONONUCLEAR FEW GRAM VARIABLE ROD RARE GRAM POSITIVE COCCI Performed at [facility], [address], [place], [place] [address]    Culture FEW METHICILLIN RESISTANT STAPHYLOCOCCUS AUREUS  Final   Report Status 06/23/2017 FINAL  Final   Organism ID, Bacteria METHICILLIN RESISTANT STAPHYLOCOCCUS AUREUS  Final      Susceptibility   Methicillin resistant staphylococcus aureus - MIC*    CIPROFLOXACIN >=8 RESISTANT Resistant     ERYTHROMYCIN >=8 RESISTANT Resistant     GENTAMICIN <=0.5 SENSITIVE Sensitive     OXACILLIN >=4 RESISTANT Resistant     TETRACYCLINE <=1 SENSITIVE Sensitive     VANCOMYCIN <=0.5 SENSITIVE Sensitive     TRIMETH/SULFA <=10 SENSITIVE Sensitive     CLINDAMYCIN <=0.25 SENSITIVE Sensitive     RIFAMPIN <=0.5 SENSITIVE Sensitive     Inducible Clindamycin NEGATIVE Sensitive     * FEW METHICILLIN RESISTANT STAPHYLOCOCCUS AUREUS    RADIOLOGY:  No results found.  EKG:   Orders placed or performed during the hospital encounter of 06/17/17  . ED EKG  . ED EKG      Management plans discussed with the patient, family and they are in agreement.  CODE STATUS:     Code Status Orders        Start     Ordered   06/17/17 1552  Full code  Continuous     06/17/17 1552    Code Status History    Date Active Date Inactive Code Status Order ID Comments User Context   10/05/2016 12:17 PM 10/09/2016  6:37 PM Full Code [id number]  Gwenyth Bender, NP ED   08/24/2016  8:19 PM 08/27/2016  7:12 PM Full Code [id number]  Narda Bonds, MD Inpatient    Advance Directive Documentation     Most Recent Value  Type of Advance Directive  Healthcare Power of Attorney  Pre-existing out of facility DNR order (yellow form or pink MOST form)  -  "MOST" Form in Place?  -  TOTAL TIME TAKING CARE OF THIS PATIENT: 35 minutes.    Altamese Dilling M.D on 06/24/2017 at 12:35  PM  Between 7am to 6pm - Pager - [phone]  After 6pm go to [email or web address] - password [email or web address]  [facility]  Office  [phone]  CC: Primary care physician; Oneal Grout, MD   Note: This dictation was prepared with Dragon dictation along with smaller phrase technology. Any transcriptional errors that result from this process are unintentional.

## 2017-06-24 NOTE — Plan of Care (Signed)
Problem: Education: Goal: Knowledge of [facility] Education information/materials will improve Outcome: Not Progressing Patient needs assistance.   Problem: Health Behavior/Discharge Planning: Goal: Ability to manage health-related needs will improve Outcome: Not Progressing Patient needs assistance.   

## 2017-06-24 NOTE — Progress Notes (Signed)
[facility] - [place] at [facility]   PATIENT NAME: Maureen Taylor    MR#:  [id number]  DATE OF BIRTH:  1934/09/27  SUBJECTIVE:  CHIEF COMPLAINT:   Chief Complaint  Patient presents with  . Altered Mental Status    Have parkinson's dz, lives in NH- no bladder control for last >  1 year. Sent with sepsis and UTI.   More alert and oriented, WBCs came down.  REVIEW OF SYSTEMS:     Review of Systems  Constitutional: Negative for chills and fever.  HENT: Negative for ear discharge, ear pain, hearing loss and sore throat.   Eyes: Negative for blurred vision and double vision.  Respiratory: Negative for cough, hemoptysis and shortness of breath.   Cardiovascular: Negative for chest pain, palpitations and leg swelling.  Gastrointestinal: Negative for abdominal pain, diarrhea, nausea and vomiting.  Musculoskeletal: Negative for back pain and joint pain.  Skin: Positive for rash.  Neurological: Negative for tremors, focal weakness and headaches.  Psychiatric/Behavioral: Negative for depression and substance abuse.    DRUG ALLERGIES:   Allergies  Allergen Reactions  . Codeine Itching  . Morphine And Related Itching  . Indocin [Indomethacin] Rash    VITALS:  Blood pressure (!) 145/75, pulse 78, temperature 98.2 F (36.8 C), temperature source Axillary, resp. rate 18, height  (1.651 m), weight 108.9 kg (240 lb), SpO2 97 %.  PHYSICAL EXAMINATION:  GENERAL:  82 y.o.-year-old patient lying in the bed with no acute distress.  EYES: Pupils equal, round, reactive to light and accommodation. No scleral icterus. Extraocular muscles intact.  HEENT: Head atraumatic, normocephalic. Oropharynx and nasopharynx clear.  NECK:  Supple, no jugular venous distention. No thyroid enlargement, no tenderness.  LUNGS: Normal breath sounds bilaterally, no wheezing, rales,rhonchi or crepitation. No use of accessory muscles of respiration.  CARDIOVASCULAR: S1, S2 normal. No murmurs,  rubs, or gallops.  ABDOMEN: Soft, nontender, nondistended. Bowel sounds present. No organomegaly or mass.  EXTREMITIES: both legs now less skin flaking and redness. No edema. Right heel ulcer NEUROLOGIC: Alert, Follows commands, generalized weakness. Sensation intact. PSYCHIATRIC: The patient is alert and oriented X3.  SKIN: all over the body- redness and fine flaking present.   Physical Exam LABORATORY PANEL:   CBC  Recent Labs Lab 06/23/17 0514  WBC 13.1*  HGB 10.6*  HCT 31.9*  PLT 424   ------------------------------------------------------------------------------------------------------------------  Chemistries   Recent Labs Lab 06/18/17 0343  06/23/17 0514 06/24/17 0531  NA 144  < > 142  --   K 4.3  < > 4.4  --   CL 114*  < > 108  --   CO2 25  < > 25  --   GLUCOSE 153*  < > 284*  --   BUN 21*  < > 26*  --   CREATININE 1.20*  < > 0.90 0.90  CALCIUM 7.9*  < > 8.5*  --   AST 35  --   --   --   ALT 5*  --   --   --   ALKPHOS 99  --   --   --   BILITOT 0.4  --   --   --   < > = values in this interval not displayed. ------------------------------------------------------------------------------------------------------------------  Cardiac Enzymes No results for input(s): TROPONINI in the last 168 hours. ------------------------------------------------------------------------------------------------------------------  RADIOLOGY:  No results found.  ASSESSMENT AND PLAN:   Principal Problem:   SIRS (systemic inflammatory [facility]) (HCC) Active Problems:   Parkinson's  disease (HCC)   UTI (urinary tract infection)   Acute encephalopathy   Pressure injury of skin  * Sepsis, Acute metabolic encephalopathy   UTI, cellulitis     IV Abx, cx sent- report multiple species.   Appreciated help of Urology   Foley and may need proper treatment of constipation.   As WBCs keep rising and pt remains confused, Called ID consult, he suggest to send Ur cx again  from Foley. Sent.   Much improved now, need to keep foley in for 1 mth as per urology.  * Cellulitis on legs, Right heel ulcer    With her skin changes and elevating WBCs,    MRSA positive, so give Vanc Iv.   Appreciated help by ID, sent Cx from heel ulcer.   As MRSA in cx, suggest to treat with Doxy for 14 days total.  * DM   COnt Lantus and ISS   Now more awake, and good oral intake, so blood sugar rising, increased lantus.  * Hypertension    Cont metoprolol.  * Skin disease   For last few weeks, have skin redness and flaking as per daughter.    She is advised to go to Dermatologist for biopsy asa per daughter.    Called dermatology consult here.    Did skin biopsy and requested triamcinolone cream on whole body daily.  * Parkinson's disease   Cont home meds  * Wheezing   Added duoneb.   Xray chest clear.   All the records are reviewed and case discussed with Care Management/Social Workerr. Management plans discussed with the patient, family and they are in agreement.  CODE STATUS: Full.  TOTAL TIME TAKING CARE OF THIS PATIENT: 35 minutes.    POSSIBLE D/C IN 1-2 DAYS, DEPENDING ON CLINICAL CONDITION.   Altamese Dilling M.D on 06/24/2017   Between 7am to 6pm - Pager - [phone]  After 6pm go to [email or web address] - password [email or web address]  [facility]  Office  [phone]  CC: Primary care physician; Oneal Grout, MD  Note: This dictation was prepared with Dragon dictation along with smaller phrase technology. Any transcriptional errors that result from this process are unintentional.

## 2017-06-24 NOTE — Clinical Social Work Note (Signed)
The patient will discharge to [facility] today most likely via non-emergent EMS. The attending RN is in the process of contacting the patient's daughter as the friend at bedside is not an appropriate individual to make medical decisions concerning transportation as he is not a next of kin. The CSW did caution the patient's friend that the patient cannot be forced to take EMS as transport; however, if the patient is not able to travel safely by car, EMS is the best option. CSW has delivered the packet and documentation. CSW will continue to follow pending additional discharge needs.  Argentina Ponder, MSW, [facility] [phone]

## 2017-07-12 ENCOUNTER — Encounter: Payer: Self-pay | Admitting: Emergency Medicine

## 2017-07-12 ENCOUNTER — Emergency Department: Payer: Medicare Other

## 2017-07-12 ENCOUNTER — Emergency Department
Admission: EM | Admit: 2017-07-12 | Discharge: 2017-07-12 | Disposition: A | Discharge status: Home / Self Care | Payer: Medicare Other | Attending: Emergency Medicine | Admitting: Emergency Medicine

## 2017-07-12 DIAGNOSIS — Z79899 Other long term (current) drug therapy: Secondary | ICD-10-CM | POA: Diagnosis not present

## 2017-07-12 DIAGNOSIS — Z794 Long term (current) use of insulin: Secondary | ICD-10-CM | POA: Diagnosis not present

## 2017-07-12 DIAGNOSIS — Z7982 Long term (current) use of aspirin: Secondary | ICD-10-CM | POA: Diagnosis not present

## 2017-07-12 DIAGNOSIS — J810 Acute pulmonary edema: Secondary | ICD-10-CM

## 2017-07-12 DIAGNOSIS — G20 Parkinson's disease: Secondary | ICD-10-CM | POA: Diagnosis not present

## 2017-07-12 DIAGNOSIS — Y9389 Activity, other specified: Secondary | ICD-10-CM | POA: Diagnosis not present

## 2017-07-12 DIAGNOSIS — Y999 Unspecified external cause status: Secondary | ICD-10-CM | POA: Diagnosis not present

## 2017-07-12 DIAGNOSIS — Y92122 Bedroom in nursing home as the place of occurrence of the external cause: Secondary | ICD-10-CM | POA: Insufficient documentation

## 2017-07-12 DIAGNOSIS — W010XXA Fall on same level from slipping, tripping and stumbling without subsequent striking against object, initial encounter: Secondary | ICD-10-CM | POA: Diagnosis not present

## 2017-07-12 DIAGNOSIS — I10 Essential (primary) hypertension: Secondary | ICD-10-CM | POA: Insufficient documentation

## 2017-07-12 DIAGNOSIS — R0602 Shortness of breath: Secondary | ICD-10-CM | POA: Diagnosis not present

## 2017-07-12 DIAGNOSIS — S0001XA Abrasion of scalp, initial encounter: Secondary | ICD-10-CM

## 2017-07-12 DIAGNOSIS — M542 Cervicalgia: Secondary | ICD-10-CM | POA: Insufficient documentation

## 2017-07-12 DIAGNOSIS — E1143 Type 2 diabetes mellitus with diabetic autonomic (poly)neuropathy: Secondary | ICD-10-CM | POA: Insufficient documentation

## 2017-07-12 DIAGNOSIS — S0003XA Contusion of scalp, initial encounter: Secondary | ICD-10-CM | POA: Insufficient documentation

## 2017-07-12 DIAGNOSIS — S0990XA Unspecified injury of head, initial encounter: Secondary | ICD-10-CM | POA: Diagnosis present

## 2017-07-12 LAB — BASIC METABOLIC PANEL
ANION GAP: 12 (ref 5–15)
BUN: 13 mg/dL (ref 6–20)
CALCIUM: 8.8 mg/dL — AB (ref 8.9–10.3)
CO2: 23 mmol/L (ref 22–32)
Chloride: 110 mmol/L (ref 101–111)
Creatinine, Ser: 0.91 mg/dL (ref 0.44–1.00)
GFR, EST NON AFRICAN AMERICAN: 58 mL/min — AB (ref >60)
Glucose, Bld: 185 mg/dL — ABNORMAL HIGH (ref 65–99)
POTASSIUM: 4.1 mmol/L (ref 3.5–5.1)
Sodium: 145 mmol/L (ref 135–145)

## 2017-07-12 LAB — TROPONIN I: Troponin I: <0.03 ng/mL (ref <0.03)

## 2017-07-12 LAB — CBC
HEMATOCRIT: 36.4 % (ref 35.0–47.0)
Hemoglobin: 11.7 g/dL — ABNORMAL LOW (ref 12.0–16.0)
MCH: 29.2 pg (ref 26.0–34.0)
MCHC: 32.1 g/dL (ref 32.0–36.0)
MCV: 91.2 fL (ref 80.0–100.0)
PLATELETS: 425 10*3/uL (ref 150–440)
RBC: 4 MIL/uL (ref 3.80–5.20)
RDW: 15.3 % — AB (ref 11.5–14.5)
WBC: 14.1 10*3/uL — AB (ref 3.6–11.0)

## 2017-07-12 MED ORDER — FUROSEMIDE 40 MG PO TABS: 40.0000 mg | ORAL_TABLET | Freq: Every day | ORAL | 0 refills | Status: AC

## 2017-07-12 MED ORDER — FUROSEMIDE 40 MG PO TABS
40.0000 mg | ORAL_TABLET | Freq: Once | ORAL | Status: AC
  Administered 2017-07-12: 40 mg via ORAL
  Filled 2017-07-12: qty 1

## 2017-07-12 MED ORDER — ACETAMINOPHEN 325 MG PO TABS
650.0000 mg | ORAL_TABLET | Freq: Once | ORAL | Status: AC
  Administered 2017-07-12: 650 mg via ORAL
  Filled 2017-07-12: qty 2

## 2017-07-12 NOTE — ED Provider Notes (Signed)
[facility] Emergency Department Provider Note  ____________________________________________  Time seen: Approximately 10:49 AM  I have reviewed the triage vital signs and the nursing notes.   HISTORY  Chief Complaint Fall    HPI Maureen Taylor is a 82 y.o. female with a history of recurrent UTI started on antibiotics yesterday, and multiple other medical comorbidities presenting with a mechanical fall. [facility] after admission for sepsis and acute encephalopathy Today, she was walking when she lost her balance, fell backwards, and struck her headresulting in a scalp contusion on the left upper head and right lateral neck pain without numbness tingling or weakness. The patient denies any loss of consciousness but she has had no associated chest pain, palpitations, lightheadedness. No recent nausea vomiting or diarrhea, fever or chills. She generally wears one to 2 Lo2 nasal cannula but has felt slightly more short of breath recently. No new cough or cold symptoms.   Past Medical History:  Diagnosis Date  . Acute encephalopathy   . Allergic rhinitis   . Ascorbic acid deficiency   . Bipolar affective disorder (HCC)   . Candidiasis   . Charcot's joint   . Chronic constipation   . Dysuria   . GERD (gastroesophageal reflux disease)   . History of falling   . HLD (hyperlipidemia)   . HTN (hypertension)   . Magnesium deficiency   . Major depressive disorder   . Mixed incontinence   . Morbid obesity (HCC)   . Muscle weakness   . Neuropathic pain   . Neuropathy   . OAB (overactive bladder)   . Obesity   . Oral pain   . Parkinson disease (HCC)   . Pneumonia   . Protein calorie malnutrition (HCC)   . Rosacea   . Sepsis (HCC) 09/2016  . Slow transit constipation   . Type 2 diabetes mellitus with diabetic polyneuropathy, with long-term current use of insulin (HCC)   . Urinary tract infection without hematuria   .  Vaginal atrophy   . Vitamin deficiency     Patient Active Problem List   Diagnosis Date Noted  . Pressure injury of skin 06/20/2017  . SIRS (systemic inflammatory response syndrome) (HCC) 06/17/2017  . Sepsis (HCC) 10/05/2016  . Acute encephalopathy 10/05/2016  . Diabetes mellitus with complication (HCC)   . Complicated UTI (urinary tract infection)   . Acute cystitis without hematuria   . Morbid obesity (HCC)   . UTI (urinary tract infection) 08/24/2016  . Altered mental status 08/24/2016  . Constipation 08/24/2016  . Hyperlipidemia 06/05/2015  . Type 2 diabetes mellitus with diabetic polyneuropathy (HCC) 06/05/2015  . Vitamin D deficiency 06/05/2015  . Recurrent UTI 04/17/2015  . Vaginal atrophy 04/17/2015  . Incontinence 04/17/2015  . OAB (overactive bladder) 03/03/2015  . Bipolar depression (HCC) 01/05/2015  . GERD (gastroesophageal reflux disease) 01/05/2015  . Allergic rhinitis 01/05/2015  . Essential hypertension 01/05/2015  . Candida infection 01/05/2015  . Parkinson's disease (HCC) 01/05/2015  . Neuropathy 01/05/2015  . Diabetes mellitus with neuropathy (HCC) 01/05/2015    Past Surgical History:  Procedure Laterality Date  . ABDOMINAL HYSTERECTOMY    . CERVICAL CONIZATION W/BX    . CHOLECYSTECTOMY    . REVISION TOTAL KNEE ARTHROPLASTY Right     Current Outpatient Rx  . Order #: [id number] Class: No Print  . Order #: [id number] Class: Historical Med  . Order #: [id number] Class: Historical Med  . Order #: [id number] Class: Historical Med  . Order #:  [id number] Class: Normal  . Order #: [id number] Class: Historical Med  . Order #: [id number] Class: Historical Med  . Order #: [id number] Class: No Print  . Order #: [id number] Class: Historical Med  . Order #: [id number] Class: Normal  . Order #: [id number] Class: Historical Med  . Order #: [id number] Class: Normal  . Order #: [id number] Class: Print  . Order #: [id number] Class: Historical Med  . Order #: [id number] Class: Normal  .  Order #: [id number] Class: Print  . Order #: [id number] Class: Historical Med  . Order #: [id number] Class: Print  . Order #: [id number] Class: Historical Med  . Order #: [id number] Class: Historical Med  . Order #: [id number] Class: Historical Med  . Order #: [id number] Class: Historical Med  . Order #: [id number] Class: Historical Med  . Order #: [id number] Class: Historical Med  . Order #: [id number] Class: Historical Med  . Order #: [id number] Class: Historical Med  . Order #: [id number] Class: Historical Med  . Order #: [id number] Class: Historical Med  . Order #: [id number] Class: Print  . Order #: [id number] Class: Historical Med  . Order #: [id number] Class: Normal    Allergies Codeine; Morphine and related; and Indocin [indomethacin]  Family History  Problem Relation Age of Onset  . Colon cancer Father   . Colon cancer Sister   . Stomach cancer Sister   . Rectal cancer Sister   . Kidney disease Neg Hx   . Bladder Cancer Neg Hx   . Prostate cancer Neg Hx     Social History Social History  Substance Use Topics  . Smoking status: Never Smoker  . Smokeless tobacco: Never Used  . Alcohol use No    Review of Systems Constitutional: No fever/chills.o lightheadedness or syncope. No loss of consciousness. Positive mechanical fall. Eyes: No visual changes.no blurred or double vision. ENT: No sore throat. No congestion or rhinorrhea. Cardiovascular: Denies chest pain. Denies palpitations. Respiratory: positiveshortness of breath.  No cough. Gastrointestinal: No abdominal pain.  No nausea, no vomiting.  No diarrhea.  No constipation. Genitourinary: Negative for dysuria.currently under treatment for UTI. Musculoskeletal: Negative for back pain.Positive right lateral neck pain.no extremity pain. Skin: positive for unchanged chronic rash. Neurological: Negative for headaches. No focal numbness, tingling or weakness. No vision or speech changes. No changes in mental  status.    ____________________________________________   PHYSICAL EXAM:  VITAL SIGNS: ED Triage Vitals  Enc Vitals Group     BP 07/12/17 0943 (!) 144/66     Pulse Rate 07/12/17 0943 62     Resp 07/12/17 0943 18     Temp 07/12/17 0943 97.6 F (36.4 C)     Temp Source 07/12/17 0943 Oral     SpO2 07/12/17 0943 97 %     Weight 07/12/17 0940 220 lb (99.8 kg)     Height 07/12/17 0940 5\' 5"  (1.651 m)     Head Circumference --      [facility] --      Pain Score 07/12/17 0940 0     Pain Loc --      Pain Edu? --      Excl. in GC? --     Constitutional: Alert and oriented. hronically ill appearing but in no acute distress. Answers questions appropriately. Eyes: Conjunctivae are normal.  EOMI. PERRLA. No raccoon eyes.No scleral icterus. Head: patient has a 1 x 4 cm abrasion with an underlying scalp hematoma that is approximately 4 x 4 centimeters on the left parietal scalp. No Battle sign. Nose: No congestion/rhinnorhea.no swelling over the nose or septal hematoma. Mouth/Throat: Mucous membranes are moist. He patient  has some dry crusted blood on the side of the lip on the right side but no evidence of intraoral injury including fractured teeth, malocclusion, laceration or abrasion of the buccal mucosa or tongue. Neck: No stridor.  Supple.  No midline C-spine tenderness to palpation, step-offs or deformities. The patient has anterior right sided neck discomfort that is worse with range of motion. No JVD. No meningismus. Cardiovascular: Normal rate, regular rhythm. No murmurs, rubs or gallops.  Respiratory: Normal respiratory effort.  No accessory muscle use or retractions. Lungs CTAB.  No wheezes, rales or ronchi.O2 sats are 96% on 2 L nasal cannula. Gastrointestinal: morbidly obese. Soft, nontender and nondistended.  No guarding or rebound.  No peritoneal signs. Musculoskeletal: No LE edema. No ttp in the calves or palpable cords.  Negative Homan's sign.pelvis is stable. Moves all  extremities without pain. Neurologic:  A&Ox3.  Speech is clear.  Face and smile are symmetric.  EOMI.  Moves all extremities well. Skin:  Skin is warm, dry and intact. Diffuse rash that is scaling was prominently on the back and chest but also present on the extremities, neck, face; chronic. Psychiatric: Mood and affect are normal. Speech and behavior are normal.  Normal judgement  ____________________________________________   LABS (all labs ordered are listed, but only abnormal results are displayed)  Labs Reviewed  CBC - Abnormal; Notable for the following:       Result Value   WBC 14.1 (*)    Hemoglobin 11.7 (*)    RDW 15.3 (*)    All other components within normal limits  BASIC METABOLIC PANEL - Abnormal; Notable for the following:    Glucose, Bld 185 (*)    Calcium 8.8 (*)    GFR calc non Af Amer 58 (*)    All other components within normal limits  TROPONIN I   ____________________________________________  EKG  ED ECG REPORT I, Rockne MenghiniNorman, Anne-Caroline, the attending physician, personally viewed and interpreted this ECG.   Date: 07/12/2017  EKG Time: 1030  Rate: 84  Rhythm: normal sinus rhythm  Axis: normal  Intervals:none  ST&T Change: no STEMI  ____________________________________________  RADIOLOGY  Dg Chest 2 View  Result Date: 07/12/2017 CLINICAL DATA:  82 year old female status post fall getting out of bed. Shortness of breath. EXAM: CHEST  2 VIEW COMPARISON:  06/21/2017 and earlier. FINDINGS: Seated AP and lateral views of the chest. Stable cardiomegaly and mediastinal contours. Small bilateral pleural effusions. Bilateral pulmonary vascularity and congestion similar to recent prior exams and increased compared to 01/11/2017. No pneumothorax. No other confluent pulmonary opacity. Stable visualized osseous structures. Paucity of bowel gas in the upper abdomen. IMPRESSION: Cardiomegaly with small bilateral pleural effusions and pulmonary interstitial edema.  Electronically Signed   By: Odessa FlemingH  Hall M.D.   On: 07/12/2017 11:41   Ct Head Wo Contrast  Result Date: 07/12/2017 CLINICAL DATA:  Pain following fall EXAM: CT HEAD WITHOUT CONTRAST CT CERVICAL SPINE WITHOUT CONTRAST TECHNIQUE: Multidetector CT imaging of the head and cervical spine was performed following the standard protocol without intravenous contrast. Multiplanar CT image reconstructions of the cervical spine were also generated. COMPARISON:  CT head and CT cervical spine August 23, 2016 FINDINGS: CT HEAD FINDINGS Brain: Moderate diffuse atrophy is stable. There is no intracranial mass, hemorrhage, extra-axial fluid collection, or midline shift. There is evidence of a prior small infarct at the genu of the right internal capsule, stable. There is small vessel disease in each internal capsule as well as throughout the centra semiovale  bilaterally. No acute infarct is demonstrable on this study. Vascular: There is no appreciable hyperdense vessel. There is calcification in each carotid siphon and distal vertebral artery. Skull: There is a left superior frontal scalp hematoma. Bony calvarium appears intact. Sinuses/Orbits: There is evidence of previous antrostomy on the left. There is mucosal thickening in the superior left maxillary antrum. There is mucosal thickening in several ethmoid air cells. Other visualized paranasal sinuses are clear. Visualized orbits appear symmetric bilaterally. Other: Mastoid air cells are clear. There is debris in the left external auditory canal. CT CERVICAL SPINE FINDINGS Alignment: There is minimal anterolisthesis of C4 on C5, stable. No new spondylolisthesis. Skull base and vertebrae: Skull base and craniocervical junction regions appear normal. There is no acute fracture. There are no blastic or lytic bone lesions. Soft tissues and spinal canal: Prevertebral soft tissues and predental space regions appear unremarkable. No paraspinous lesions are evident. No cord or canal  hematoma. Calcification is noted in the nuchal ligament posterior to C6. Disc levels: Ankylosis at C5-6 and C6-7 again noted. There is marked disc space narrowing at C7-T1. There are prominent anterior osteophytes at C3 and C4. There is multifocal facet arthropathy. There is exit foraminal narrowing due to bony hypertrophy at C2-3 on the right, at C3-4 bilaterally, at C4-5 bilaterally, and at C5-6 on the right. There is no frank disc extrusion. There is bony hypertrophy posteriorly at C5-6 and C6-7 causing moderate spinal stenosis at these levels. Upper chest: There are free-flowing pleural effusions bilaterally. There is atelectatic change in the lung apices. Other: There is calcification of the left carotid artery. There are subcentimeter supraclavicular lymph nodes bilaterally. IMPRESSION: CT head: Atrophy with supratentorial small vessel disease. 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 No acute infarct evident. No intracranial mass, hemorrhage, or extra-axial fluid collection. There are foci of arterial vascular calcification. There are foci of paranasal sinus disease. There is probable cerumen left external auditory canal. CT cervical spine: 1.  No fracture.  Slight spondylolisthesis at C4-5 is stable. 2. Multilevel arthropathy. Spinal stenosis noted at C5-6 and C6-7 due to bony hypertrophy. No frank disc extrusion. 3. Free-flowing pleural effusions noted bilaterally. Correlation with chest radiograph advised. 4.  Calcification left carotid artery. Electronically Signed   By: Bretta Bang III M.D.   On: 07/12/2017 11:30   Ct Cervical Spine Wo Contrast  Result Date: 07/12/2017 CLINICAL DATA:  Pain following fall EXAM: CT HEAD WITHOUT CONTRAST CT CERVICAL SPINE WITHOUT CONTRAST TECHNIQUE: Multidetector CT imaging of the head and cervical spine was performed following the standard protocol without intravenous contrast. Multiplanar CT image reconstructions of  the cervical spine were also generated. COMPARISON:  CT head and CT cervical spine August 23, 2016 FINDINGS: CT HEAD FINDINGS Brain: Moderate diffuse atrophy is stable. There is no intracranial mass, hemorrhage, extra-axial fluid collection, or midline shift. There is evidence of a prior small infarct at the genu of the right internal capsule, stable. There is small vessel disease in each internal capsule as well as throughout the centra semiovale bilaterally. No acute infarct is demonstrable on this study. Vascular: There is no appreciable hyperdense vessel. There is calcification in each carotid siphon and distal vertebral artery. Skull: There is a left superior frontal scalp hematoma. Bony calvarium appears intact. Sinuses/Orbits: There is evidence of previous antrostomy on the left. There is mucosal thickening in the superior left maxillary antrum. There is mucosal thickening in several ethmoid air cells. Other visualized  paranasal sinuses are clear. Visualized orbits appear symmetric bilaterally. Other: Mastoid air cells are clear. There is debris in the left external auditory canal. CT CERVICAL SPINE FINDINGS Alignment: There is minimal anterolisthesis of C4 on C5, stable. No new spondylolisthesis. Skull base and vertebrae: Skull base and craniocervical junction regions appear normal. There is no acute fracture. There are no blastic or lytic bone lesions. Soft tissues and spinal canal: Prevertebral soft tissues and predental space regions appear unremarkable. No paraspinous lesions are evident. No cord or canal hematoma. Calcification is noted in the nuchal ligament posterior to C6. Disc levels: Ankylosis at C5-6 and C6-7 again noted. There is marked disc space narrowing at C7-T1. There are prominent anterior osteophytes at C3 and C4. There is multifocal facet arthropathy. There is exit foraminal narrowing due to bony hypertrophy at C2-3 on the right, at C3-4 bilaterally, at C4-5 bilaterally, and at C5-6 on  the right. There is no frank disc extrusion. There is bony hypertrophy posteriorly at C5-6 and C6-7 causing moderate spinal stenosis at these levels. Upper chest: There are free-flowing pleural effusions bilaterally. There is atelectatic change in the lung apices. Other: There is calcification of the left carotid artery. There are subcentimeter supraclavicular lymph nodes bilaterally. IMPRESSION: CT head: Atrophy with supratentorial small vessel disease. 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 No acute infarct evident. No intracranial mass, hemorrhage, or extra-axial fluid collection. There are foci of arterial vascular calcification. There are foci of paranasal sinus disease. There is probable cerumen left external auditory canal. CT cervical spine: 1.  No fracture.  Slight spondylolisthesis at C4-5 is stable. 2. Multilevel arthropathy. Spinal stenosis noted at C5-6 and C6-7 due to bony hypertrophy. No frank disc extrusion. 3. Free-flowing pleural effusions noted bilaterally. Correlation with chest radiograph advised. 4.  Calcification left carotid artery. Electronically Signed   By: Bretta Bang III M.D.   On: 07/12/2017 11:30    ____________________________________________   PROCEDURES  Procedure(s) performed: None  Procedures  Critical Care performed: No ____________________________________________   INITIAL IMPRESSION / ASSESSMENT AND PLAN / ED COURSE  Pertinent labs & imaging results that were available during my care of the patient were reviewed by me and considered in my medical decision making (see chart for details).  82 y.o. Female with multiple chronic comorbidities presenting with a mechanical fall resulting in head trauma today. We'll get a CT scan to rule out any acute intracranial abnormality. Given that the patient has had some shortness of breath, get a chest x-ray although she has no cough or cold symptoms, fever, or hypoxia or  new oxygen requirement. We'll get basic labs. If the patient's workup in the emergency department is reassuring, I anticipate discharge home with close PMD follow-up.  ----------------------------------------- 12:47 PM on 07/12/2017 -----------------------------------------  The patient's traumatic workup is reassuring with no acute intracranial process or acute fractures in the cervical spine. Her chest x-ray does show some pulmonary edema, which has been seen in the past. Here, she has oxygen saturations are within normal limits on her baseline oxygen supplementation. There is no evidence for acute CHF exacerbation or pulmonary infection. I will treat her with a dose of Lasix here and have her take Lasix for the next 2 days to improve this, which should improve her shortness of breath as well. Her creatinine is 0.91, so her Lasix should be well tolerated. I will have her follow-up with her primary care physician in the next 1-2 days  for reevaluation. At this time, the patient is stable for discharge. Follow-up instructions as well as return precautions were discussed.  ____________________________________________  FINAL CLINICAL IMPRESSION(S) / ED DIAGNOSES  Final diagnoses:  Hematoma of scalp, initial encounter  Abrasion of scalp, initial encounter  Neck pain on right side  Acute pulmonary edema (HCC)  Shortness of breath         NEW MEDICATIONS STARTED DURING THIS VISIT:  New Prescriptions   FUROSEMIDE (LASIX) 40 MG TABLET    Take 1 tablet (40 mg total) by mouth daily.      Rockne Menghini, MD 07/12/17 1248

## 2017-07-12 NOTE — ED Triage Notes (Addendum)
Pt arrived via EMS from [facility] for reports of witnessed fall. Pt reports she slipped while getting out of bed. Denies LOC. Denies dizziness prior to fall. Pt reports frequent falls. Pt alert and oriented on arrival. EMS reports VSS. EMS applied alternate C-collar due to collar not fitting pt as a precaution. Pt reports left neck pain on site, pt denies pain on arrival to ED.

## 2017-07-12 NOTE — ED Notes (Signed)
Called EMS for transport to [facility]  [phone]

## 2017-07-12 NOTE — Discharge Instructions (Signed)
Please take all precautions to prevent falls. For your abrasion, you may apply bacitracin, Neosporin, or any triple antibiotic cream 3 times daily until it has healed.  Today, you do have some fluid in the lung. Please take Lasix, a water pill, to decrease the fluid in your lungs for the next 2 days.  Return to the emergency department if you develop severe pain, shortness of breath, headache or vomiting, numbness tingling or weakness, or any other symptoms concerning to you.

## 2017-07-19 ENCOUNTER — Ambulatory Visit: Payer: Medicare Other

## 2017-07-20 ENCOUNTER — Encounter: Payer: Self-pay | Admitting: Urology

## 2017-07-20 ENCOUNTER — Ambulatory Visit (INDEPENDENT_AMBULATORY_CARE_PROVIDER_SITE_OTHER): Payer: Medicare Other | Admitting: Urology

## 2017-07-20 VITALS — BP 112/63 | HR 68 | Ht 65.0 in | Wt 211.2 lb

## 2017-07-20 DIAGNOSIS — N390 Urinary tract infection, site not specified: Secondary | ICD-10-CM

## 2017-07-20 NOTE — Progress Notes (Signed)
Cath Change/ Replacement  Patient is present today for a catheter change due to urinary retention.  15ml of water was removed from the balloon, a 18FR foley cath was removed with out difficulty.  Patient was cleaned and prepped in a sterile fashion with betadine and 2% lidocaine jelly was instilled into the urethra. A 18 FR foley cath was replaced into the bladder no complications were noted Urine return was noted 20ml and urine was yellow in color. The balloon was filled with 10ml of sterile water. A night bag was attached for drainage. Patient was given proper instruction on catheter care.    Preformed by: Teressa Lowerarrie Jariyah Hackley, CMA  Follow up: She will get monthly Cath changes from her nursing home

## 2017-07-20 NOTE — Progress Notes (Addendum)
07/20/2017 4:08 PM   Maureen Taylor Jun 08, 1935 [id number]  Referring provider: Oneal GroutPandey, Mahima, MD [address] [place], [place] [id number]  Chief Complaint  Patient presents with  . Recurrent UTI    HPI: 82 year old female with a possible cause her significant for Parkinson's disease and bipolar disorder as well as recurrent urinary tract infections who was recently admitted to the hospital with altered mental status and concern for urosepsis.    The patient has been seen and evaluated in the urology clinic for recurrent infections in the past. Her immobility is worsened and as has her urgency and incontinence.  She was voiding and she is soiled diapers.  A Foley catheter was placed at her recent admission for this reason.  Her urgency has resolved and she is much more comfortable with the catheter.  She has done well since discharge.  In discussion with patient and her family she has been treated for urinary tract infections very frequently.  Many of the times are without symptoms.  She was also treated for a UTI for her baseline urgency on multiple occasions.  She has had however symptomatic UTIs in the past that were treated.   PMH: Past Medical History:  Diagnosis Date  . Acute encephalopathy   . Allergic rhinitis   . Ascorbic acid deficiency   . Bipolar affective disorder (HCC)   . Candidiasis   . Charcot's joint   . Chronic constipation   . Dysuria   . GERD (gastroesophageal reflux disease)   . History of falling   . HLD (hyperlipidemia)   . HTN (hypertension)   . Magnesium deficiency   . Major depressive disorder   . Mixed incontinence   . Morbid obesity (HCC)   . Muscle weakness   . Neuropathic pain   . Neuropathy   . OAB (overactive bladder)   . Obesity   . Oral pain   . Parkinson disease (HCC)   . Pneumonia   . Protein calorie malnutrition (HCC)   . Rosacea   . Sepsis (HCC) 09/2016  . Slow transit constipation   . Type 2 diabetes mellitus with  diabetic polyneuropathy, with long-term current use of insulin (HCC)   . Urinary tract infection without hematuria   . Vaginal atrophy   . Vitamin deficiency     Surgical History: Past Surgical History:  Procedure Laterality Date  . ABDOMINAL HYSTERECTOMY    . CERVICAL CONIZATION W/BX    . CHOLECYSTECTOMY    . REVISION TOTAL KNEE ARTHROPLASTY Right     Home Medications:  Allergies as of 07/20/2017      Reactions   Codeine Itching   Morphine And Related Itching   Indocin [indomethacin] Rash      Medication List       Accurate as of 07/20/17  4:08 PM. Always use your most recent med list.          alum & mag hydroxide-simeth 200-200-20 MG/5ML suspension Commonly known as:  MAALOX/MYLANTA Take 30 mLs by mouth every 6 (six) hours as needed for indigestion or heartburn.   ARIPiprazole 5 MG tablet Commonly known as:  ABILIFY Take 2.5 mg by mouth 2 (two) times daily.   aspirin 81 MG EC tablet Take 81 mg by mouth daily. Swallow whole.   atorvastatin 20 MG tablet Commonly known as:  LIPITOR Take 20 mg by mouth daily.   bisacodyl 10 MG suppository Commonly known as:  DULCOLAX Place 1 suppository (10 mg total) rectally daily as needed  for moderate constipation.   carbidopa-levodopa 25-100 MG disintegrating tablet Commonly known as:  PARCOPA Take 1 tablet by mouth 2 (two) times daily.   carboxymethylcellulose 1 % ophthalmic solution Apply 1 drop to eye 3 (three) times daily.   conjugated estrogens vaginal cream Commonly known as:  PREMARIN Place 1 Applicatorful vaginally every Monday, Wednesday, and Friday. Apply 0.5mg  (pea-sized amount)  just inside the vaginal introitus with a finger-tip every Monday, Wednesday and Friday nights.   Cranberry 425 MG Caps Take 1 capsule by mouth daily.   docusate sodium 100 MG capsule Commonly known as:  COLACE Take 1 capsule (100 mg total) by mouth 2 (two) times daily.   DULoxetine 60 MG capsule Commonly known as:   CYMBALTA Take 60 mg by mouth daily.   feeding supplement (ENSURE ENLIVE) Liqd Take 237 mLs by mouth 2 (two) times daily between meals.   furosemide 40 MG tablet Commonly known as:  LASIX Take 1 tablet (40 mg total) by mouth daily.   gabapentin 100 MG capsule Commonly known as:  NEURONTIN Take 200 mg by mouth at bedtime.   hydrOXYzine 25 MG tablet Commonly known as:  ATARAX/VISTARIL Take 1 tablet (25 mg total) by mouth 2 (two) times daily as needed for anxiety.   insulin glargine 100 UNIT/ML injection Commonly known as:  LANTUS Inject 0.35 mLs (35 Units total) into the skin 2 (two) times daily.   insulin lispro 100 UNIT/ML injection Commonly known as:  HUMALOG Inject 2-12 Units into the skin See admin instructions. Sliding Scale   ipratropium-albuterol 0.5-2.5 (3) MG/3ML Soln Commonly known as:  DUONEB Take 3 mLs by nebulization every 4 (four) hours as needed (wheezing).   lamoTRIgine 100 MG tablet Commonly known as:  LAMICTAL Take 100 mg by mouth 2 (two) times daily.   LINZESS 145 MCG Caps capsule Generic drug:  linaclotide Take 145 mcg by mouth daily before breakfast.   lisinopril 20 MG tablet Commonly known as:  PRINIVIL,ZESTRIL Take 20 mg by mouth daily.   Magnesium 400 MG Caps Take 1 capsule by mouth daily.   metoprolol tartrate 25 MG tablet Commonly known as:  LOPRESSOR Take 25 mg by mouth 2 (two) times daily. Take 25 mg by mouth twice a day for HTN   METROLOTION 0.75 % Lotn Generic drug:  METRONIDAZOLE (TOPICAL) Apply 1 application topically every Monday, Wednesday, and Friday.   multivitamin-lutein Caps capsule Take 1 capsule by mouth daily.   oxybutynin 5 MG tablet Commonly known as:  DITROPAN Take 5 mg by mouth 2 (two) times daily.   pantoprazole 40 MG tablet Commonly known as:  PROTONIX Take 40 mg by mouth daily.   polyethylene glycol packet Commonly known as:  MIRALAX / GLYCOLAX Take 17 g by mouth 2 (two) times daily.   predniSONE 10 MG  (21) Tbpk tablet Commonly known as:  STERAPRED UNI-PAK 21 TAB Take 6 tabs first day, 5 tab on day 2, then 4 on day 3rd, 3 tabs on day 4th , 2 tab on day 5th, and 1 tab on 6th day.   sennosides-docusate sodium 8.6-50 MG tablet Commonly known as:  SENOKOT-S Take 2 tablets by mouth 2 (two) times daily.   triamcinolone cream 0.1 % Commonly known as:  KENALOG Apply topically daily. To whole body       Allergies:  Allergies  Allergen Reactions  . Codeine Itching  . Morphine And Related Itching  . Indocin [Indomethacin] Rash    Family History: Family History  Problem Relation  Age of Onset  . Colon cancer Father   . Colon cancer Sister   . Stomach cancer Sister   . Rectal cancer Sister   . Kidney disease Neg Hx   . Bladder Cancer Neg Hx   . Prostate cancer Neg Hx     Social History:  reports that she has never smoked. She has never used smokeless tobacco. She reports that she does not drink alcohol or use drugs.  ROS: UROLOGY Frequent Urination?: Yes Hard to postpone urination?: No Burning/pain with urination?: Yes Get up at night to urinate?: No Leakage of urine?: No Urine stream starts and stops?: No Trouble starting stream?: No Do you have to strain to urinate?: No Blood in urine?: No Urinary tract infection?: No Sexually transmitted disease?: No Injury to kidneys or bladder?: No Painful intercourse?: No Weak stream?: No Currently pregnant?: No Vaginal bleeding?: No Last menstrual period?: n  Gastrointestinal Nausea?: No Vomiting?: No Indigestion/heartburn?: No Diarrhea?: No Constipation?: Yes  Constitutional Fever: No Night sweats?: No Weight loss?: No Fatigue?: No  Skin Skin rash/lesions?: Yes Itching?: Yes  Eyes Blurred vision?: No Double vision?: No  Ears/Nose/Throat Sore throat?: No Sinus problems?: No  Hematologic/Lymphatic Swollen glands?: No Easy bruising?: No  Cardiovascular Leg swelling?: No Chest pain?:  No  Respiratory Cough?: No Shortness of breath?: No  Endocrine Excessive thirst?: No  Musculoskeletal Back pain?: No Joint pain?: No  Neurological Headaches?: No Dizziness?: No  Psychologic Depression?: No Anxiety?: No  Physical Exam: BP 112/63 (BP Location: Left Arm, Patient Position: Sitting, Cuff Size: Normal)   Pulse 68   Ht 5\' 5"  (1.651 m)   Wt 211 lb 3.2 oz (95.8 kg)   BMI 35.15 kg/m   Constitutional:  Alert and oriented, No acute distress. HEENT: [place] AT, moist mucus membranes.  Trachea midline, no masses. Cardiovascular: No clubbing, cyanosis, or edema. Respiratory: Normal respiratory effort, no increased work of breathing. GI: Abdomen is soft, nontender, nondistended, no abdominal masses GU: No CVA tenderness.  Skin: No rashes, bruises or suspicious lesions. Lymph: No cervical or inguinal adenopathy. Neurologic: Grossly intact, no focal deficits, moving all 4 extremities. Psychiatric: Normal mood and affect.  Laboratory Data: Lab Results  Component Value Date   WBC 14.1 (H) 07/12/2017   HGB 11.7 (L) 07/12/2017   HCT 36.4 07/12/2017   MCV 91.2 07/12/2017   PLT 425 07/12/2017    Lab Results  Component Value Date   CREATININE 0.91 07/12/2017    No results found for: PSA  No results found for: TESTOSTERONE  Lab Results  Component Value Date   HGBA1C 9.2 (H) 06/17/2017    Urinalysis    Component Value Date/Time   COLORURINE YELLOW (A) 06/20/2017 1450   APPEARANCEUR HAZY (A) 06/20/2017 1450   APPEARANCEUR Cloudy (A) 02/25/2016 1548   LABSPEC 1.019 06/20/2017 1450   LABSPEC 1.017 10/30/2014 0830   PHURINE 5.0 06/20/2017 1450   GLUCOSEU NEGATIVE 06/20/2017 1450   GLUCOSEU Negative 10/30/2014 0830   HGBUR NEGATIVE 06/20/2017 1450   BILIRUBINUR NEGATIVE 06/20/2017 1450   BILIRUBINUR Negative 02/25/2016 1548   BILIRUBINUR Negative 10/30/2014 0830   KETONESUR NEGATIVE 06/20/2017 1450   PROTEINUR 100 (A) 06/20/2017 1450   NITRITE NEGATIVE  06/20/2017 1450   LEUKOCYTESUR SMALL (A) 06/20/2017 1450   LEUKOCYTESUR 3+ (A) 02/25/2016 1548   LEUKOCYTESUR 2+ 10/30/2014 0830     Assessment & Plan:    1.  Urinary urgency 2.  Recurrent urinary tract infection I had a long discussion with the patient  and her daughter.  Given her urgency which was every 10 minutes and the fact she was often sitting in damp diapers, I think she is doing much better with the Foley catheter.  I have recommended that this be continued chronically.  We did have a long discussion about asymptomatic bacteriuria versus urinary tract infection.  We did discuss that with a Foley catheter she will likely always have a positive urine culture.  We discussed not treating her for urine Neri tract infection unless she had symptoms.  Symptoms include new onset suprapubic discomfort, burning around the catheter, new onset lateralized flank pain, unexplained fevers, and unexplained changes in mental status.  The family voiced the understanding.  Her nursing home will change the Foley monthly.  She will see [place] annually.  If she continues to have symptomatic urinary tract infections, she will follow-up sooner and we will discuss a suppressive antibiotic at that point.  Patient also had an apparent vaginal yeast infection on changing of her catheter.  She was given a written prescription for Diflucan 150 mg for 1 dose.  Return in about 1 year (around 07/20/2018).  Hildred Laser, MD  [facility] [address], [address] [place], [place] [address] [phone]

## 2017-08-23 DIAGNOSIS — L603 Nail dystrophy: Secondary | ICD-10-CM | POA: Diagnosis not present

## 2017-08-23 DIAGNOSIS — I739 Peripheral vascular disease, unspecified: Secondary | ICD-10-CM | POA: Diagnosis not present

## 2017-08-23 DIAGNOSIS — B351 Tinea unguium: Secondary | ICD-10-CM | POA: Diagnosis not present

## 2017-08-29 ENCOUNTER — Other Ambulatory Visit
Admission: RE | Admit: 2017-08-29 | Discharge: 2017-08-29 | Disposition: A | Discharge status: Home / Self Care | Payer: Medicare Other | Source: Ambulatory Visit | Attending: Family Medicine | Admitting: Family Medicine

## 2017-08-29 DIAGNOSIS — L89610 Pressure ulcer of right heel, unstageable: Secondary | ICD-10-CM | POA: Insufficient documentation

## 2017-09-01 ENCOUNTER — Encounter: Payer: Self-pay | Admitting: Emergency Medicine

## 2017-09-01 ENCOUNTER — Other Ambulatory Visit: Payer: Self-pay

## 2017-09-01 ENCOUNTER — Emergency Department: Payer: Medicare Other

## 2017-09-01 ENCOUNTER — Inpatient Hospital Stay
Admission: EM | Admit: 2017-09-01 | Discharge: 2017-09-26 | DRG: 239 | Disposition: E | Payer: Medicare Other | Attending: Internal Medicine | Admitting: Internal Medicine

## 2017-09-01 DIAGNOSIS — Z7401 Bed confinement status: Secondary | ICD-10-CM

## 2017-09-01 DIAGNOSIS — E1169 Type 2 diabetes mellitus with other specified complication: Secondary | ICD-10-CM | POA: Diagnosis present

## 2017-09-01 DIAGNOSIS — Z515 Encounter for palliative care: Secondary | ICD-10-CM | POA: Diagnosis not present

## 2017-09-01 DIAGNOSIS — Z96651 Presence of right artificial knee joint: Secondary | ICD-10-CM | POA: Diagnosis present

## 2017-09-01 DIAGNOSIS — R0602 Shortness of breath: Secondary | ICD-10-CM

## 2017-09-01 DIAGNOSIS — Z794 Long term (current) use of insulin: Secondary | ICD-10-CM

## 2017-09-01 DIAGNOSIS — E119 Type 2 diabetes mellitus without complications: Secondary | ICD-10-CM | POA: Diagnosis not present

## 2017-09-01 DIAGNOSIS — E11610 Type 2 diabetes mellitus with diabetic neuropathic arthropathy: Secondary | ICD-10-CM | POA: Diagnosis present

## 2017-09-01 DIAGNOSIS — R6520 Severe sepsis without septic shock: Secondary | ICD-10-CM | POA: Diagnosis not present

## 2017-09-01 DIAGNOSIS — M79671 Pain in right foot: Secondary | ICD-10-CM | POA: Diagnosis present

## 2017-09-01 DIAGNOSIS — I469 Cardiac arrest, cause unspecified: Secondary | ICD-10-CM | POA: Diagnosis not present

## 2017-09-01 DIAGNOSIS — E46 Unspecified protein-calorie malnutrition: Secondary | ICD-10-CM | POA: Diagnosis present

## 2017-09-01 DIAGNOSIS — N179 Acute kidney failure, unspecified: Secondary | ICD-10-CM | POA: Diagnosis not present

## 2017-09-01 DIAGNOSIS — L97519 Non-pressure chronic ulcer of other part of right foot with unspecified severity: Secondary | ICD-10-CM | POA: Diagnosis present

## 2017-09-01 DIAGNOSIS — Z7982 Long term (current) use of aspirin: Secondary | ICD-10-CM

## 2017-09-01 DIAGNOSIS — Z993 Dependence on wheelchair: Secondary | ICD-10-CM | POA: Diagnosis not present

## 2017-09-01 DIAGNOSIS — J9690 Respiratory failure, unspecified, unspecified whether with hypoxia or hypercapnia: Secondary | ICD-10-CM | POA: Diagnosis not present

## 2017-09-01 DIAGNOSIS — I10 Essential (primary) hypertension: Secondary | ICD-10-CM | POA: Diagnosis present

## 2017-09-01 DIAGNOSIS — J449 Chronic obstructive pulmonary disease, unspecified: Secondary | ICD-10-CM | POA: Diagnosis present

## 2017-09-01 DIAGNOSIS — L97413 Non-pressure chronic ulcer of right heel and midfoot with necrosis of muscle: Secondary | ICD-10-CM

## 2017-09-01 DIAGNOSIS — K219 Gastro-esophageal reflux disease without esophagitis: Secondary | ICD-10-CM | POA: Diagnosis present

## 2017-09-01 DIAGNOSIS — L89622 Pressure ulcer of left heel, stage 2: Secondary | ICD-10-CM | POA: Diagnosis present

## 2017-09-01 DIAGNOSIS — A419 Sepsis, unspecified organism: Secondary | ICD-10-CM | POA: Diagnosis not present

## 2017-09-01 DIAGNOSIS — D62 Acute posthemorrhagic anemia: Secondary | ICD-10-CM | POA: Diagnosis not present

## 2017-09-01 DIAGNOSIS — J9601 Acute respiratory failure with hypoxia: Secondary | ICD-10-CM | POA: Diagnosis not present

## 2017-09-01 DIAGNOSIS — E1152 Type 2 diabetes mellitus with diabetic peripheral angiopathy with gangrene: Secondary | ICD-10-CM | POA: Diagnosis present

## 2017-09-01 DIAGNOSIS — I7090 Unspecified atherosclerosis: Secondary | ICD-10-CM | POA: Diagnosis not present

## 2017-09-01 DIAGNOSIS — L89302 Pressure ulcer of unspecified buttock, stage 2: Secondary | ICD-10-CM | POA: Diagnosis present

## 2017-09-01 DIAGNOSIS — I959 Hypotension, unspecified: Secondary | ICD-10-CM | POA: Diagnosis present

## 2017-09-01 DIAGNOSIS — G20 Parkinson's disease: Secondary | ICD-10-CM | POA: Diagnosis present

## 2017-09-01 DIAGNOSIS — J690 Pneumonitis due to inhalation of food and vomit: Secondary | ICD-10-CM | POA: Diagnosis not present

## 2017-09-01 DIAGNOSIS — M86671 Other chronic osteomyelitis, right ankle and foot: Secondary | ICD-10-CM | POA: Diagnosis present

## 2017-09-01 DIAGNOSIS — Z7189 Other specified counseling: Secondary | ICD-10-CM | POA: Diagnosis not present

## 2017-09-01 DIAGNOSIS — I70261 Atherosclerosis of native arteries of extremities with gangrene, right leg: Secondary | ICD-10-CM | POA: Diagnosis not present

## 2017-09-01 DIAGNOSIS — Z90710 Acquired absence of both cervix and uterus: Secondary | ICD-10-CM

## 2017-09-01 DIAGNOSIS — E1142 Type 2 diabetes mellitus with diabetic polyneuropathy: Secondary | ICD-10-CM | POA: Diagnosis present

## 2017-09-01 DIAGNOSIS — Z66 Do not resuscitate: Secondary | ICD-10-CM | POA: Diagnosis not present

## 2017-09-01 DIAGNOSIS — G9340 Encephalopathy, unspecified: Secondary | ICD-10-CM | POA: Diagnosis present

## 2017-09-01 DIAGNOSIS — D6489 Other specified anemias: Secondary | ICD-10-CM | POA: Diagnosis not present

## 2017-09-01 DIAGNOSIS — E785 Hyperlipidemia, unspecified: Secondary | ICD-10-CM | POA: Diagnosis present

## 2017-09-01 DIAGNOSIS — E870 Hyperosmolality and hypernatremia: Secondary | ICD-10-CM | POA: Diagnosis not present

## 2017-09-01 DIAGNOSIS — R092 Respiratory arrest: Secondary | ICD-10-CM | POA: Diagnosis not present

## 2017-09-01 DIAGNOSIS — N3281 Overactive bladder: Secondary | ICD-10-CM | POA: Diagnosis present

## 2017-09-01 DIAGNOSIS — L97419 Non-pressure chronic ulcer of right heel and midfoot with unspecified severity: Secondary | ICD-10-CM | POA: Diagnosis not present

## 2017-09-01 DIAGNOSIS — M86171 Other acute osteomyelitis, right ankle and foot: Secondary | ICD-10-CM | POA: Diagnosis present

## 2017-09-01 DIAGNOSIS — Z01818 Encounter for other preprocedural examination: Secondary | ICD-10-CM

## 2017-09-01 DIAGNOSIS — Z6831 Body mass index (BMI) 31.0-31.9, adult: Secondary | ICD-10-CM

## 2017-09-01 DIAGNOSIS — M869 Osteomyelitis, unspecified: Secondary | ICD-10-CM | POA: Diagnosis not present

## 2017-09-01 DIAGNOSIS — F3130 Bipolar disorder, current episode depressed, mild or moderate severity, unspecified: Secondary | ICD-10-CM | POA: Diagnosis present

## 2017-09-01 DIAGNOSIS — E11621 Type 2 diabetes mellitus with foot ulcer: Secondary | ICD-10-CM | POA: Diagnosis present

## 2017-09-01 DIAGNOSIS — M79606 Pain in leg, unspecified: Secondary | ICD-10-CM

## 2017-09-01 DIAGNOSIS — I70238 Atherosclerosis of native arteries of right leg with ulceration of other part of lower right leg: Secondary | ICD-10-CM | POA: Diagnosis not present

## 2017-09-01 LAB — COMPREHENSIVE METABOLIC PANEL
ALBUMIN: 3 g/dL — AB (ref 3.5–5.0)
AST: 21 U/L (ref 15–41)
Alkaline Phosphatase: 132 U/L — ABNORMAL HIGH (ref 38–126)
Anion gap: 14 (ref 5–15)
BUN: 35 mg/dL — ABNORMAL HIGH (ref 6–20)
CHLORIDE: 99 mmol/L — AB (ref 101–111)
CO2: 27 mmol/L (ref 22–32)
CREATININE: 1.48 mg/dL — AB (ref 0.44–1.00)
Calcium: 10.2 mg/dL (ref 8.9–10.3)
GFR calc non Af Amer: 32 mL/min — ABNORMAL LOW (ref >60)
GFR, EST AFRICAN AMERICAN: 37 mL/min — AB (ref >60)
Glucose, Bld: 153 mg/dL — ABNORMAL HIGH (ref 65–99)
Potassium: 5.3 mmol/L — ABNORMAL HIGH (ref 3.5–5.1)
SODIUM: 140 mmol/L (ref 135–145)
Total Bilirubin: 0.1 mg/dL — ABNORMAL LOW (ref 0.3–1.2)
Total Protein: 7.8 g/dL (ref 6.5–8.1)

## 2017-09-01 LAB — CBC WITH DIFFERENTIAL/PLATELET
BAND NEUTROPHILS: 1 %
BASOS ABS: 0 10*3/uL (ref 0–0.1)
BASOS PCT: 0 %
Blasts: 0 %
EOS ABS: 0.2 10*3/uL (ref 0–0.7)
EOS PCT: 1 %
HCT: 37.7 % (ref 35.0–47.0)
Hemoglobin: 12.3 g/dL (ref 12.0–16.0)
LYMPHS ABS: 1.5 10*3/uL (ref 1.0–3.6)
Lymphocytes Relative: 8 %
MCH: 29.2 pg (ref 26.0–34.0)
MCHC: 32.6 g/dL (ref 32.0–36.0)
MCV: 89.5 fL (ref 80.0–100.0)
METAMYELOCYTES PCT: 0 %
MONO ABS: 0.4 10*3/uL (ref 0.2–0.9)
MYELOCYTES: 0 %
Monocytes Relative: 2 %
NRBC: 0 /100{WBCs}
Neutro Abs: 16.4 10*3/uL — ABNORMAL HIGH (ref 1.4–6.5)
Neutrophils Relative %: 88 %
Other: 0 %
PLATELETS: 565 10*3/uL — AB (ref 150–440)
Promyelocytes Absolute: 0 %
RBC: 4.22 MIL/uL (ref 3.80–5.20)
RDW: 16 % — ABNORMAL HIGH (ref 11.5–14.5)
WBC: 18.5 10*3/uL — ABNORMAL HIGH (ref 3.6–11.0)

## 2017-09-01 LAB — LACTIC ACID, PLASMA: Lactic Acid, Venous: 1.4 mmol/L (ref 0.5–1.9)

## 2017-09-01 LAB — GLUCOSE, CAPILLARY: Glucose-Capillary: 152 mg/dL — ABNORMAL HIGH (ref 65–99)

## 2017-09-01 MED ORDER — PIPERACILLIN-TAZOBACTAM 3.375 G IVPB
3.3750 g | Freq: Three times a day (TID) | INTRAVENOUS | Status: DC
  Filled 2017-09-01: qty 50

## 2017-09-01 MED ORDER — IPRATROPIUM-ALBUTEROL 0.5-2.5 (3) MG/3ML IN SOLN: 3.0000 mL | RESPIRATORY_TRACT | Status: DC | PRN

## 2017-09-01 MED ORDER — PIPERACILLIN-TAZOBACTAM 3.375 G IVPB 30 MIN
3.3750 g | Freq: Once | INTRAVENOUS | Status: AC
  Administered 2017-09-01: 3.375 g via INTRAVENOUS
  Filled 2017-09-01: qty 50

## 2017-09-01 MED ORDER — INSULIN ASPART 100 UNIT/ML ~~LOC~~ SOLN
0.0000 [IU] | Freq: Three times a day (TID) | SUBCUTANEOUS | Status: DC
  Administered 2017-09-03 (×2): 1 [IU] via SUBCUTANEOUS
  Administered 2017-09-04: 2 [IU] via SUBCUTANEOUS
  Administered 2017-09-04: 1 [IU] via SUBCUTANEOUS
  Administered 2017-09-05: 2 [IU] via SUBCUTANEOUS
  Administered 2017-09-05: 1 [IU] via SUBCUTANEOUS
  Administered 2017-09-05: 3 [IU] via SUBCUTANEOUS
  Administered 2017-09-06: 2 [IU] via SUBCUTANEOUS
  Administered 2017-09-06 – 2017-09-08 (×3): 1 [IU] via SUBCUTANEOUS
  Administered 2017-09-08 – 2017-09-09 (×3): 2 [IU] via SUBCUTANEOUS
  Administered 2017-09-09 – 2017-09-10 (×2): 1 [IU] via SUBCUTANEOUS
  Filled 2017-09-01 (×16): qty 1

## 2017-09-01 MED ORDER — ATORVASTATIN CALCIUM 20 MG PO TABS
20.0000 mg | ORAL_TABLET | Freq: Every day | ORAL | Status: DC
  Administered 2017-09-03 – 2017-09-09 (×5): 20 mg via ORAL
  Filled 2017-09-01 (×6): qty 1

## 2017-09-01 MED ORDER — ACETAMINOPHEN 500 MG PO TABS
1000.0000 mg | ORAL_TABLET | Freq: Three times a day (TID) | ORAL | Status: DC | PRN
  Administered 2017-09-02 – 2017-09-06 (×4): 1000 mg via ORAL
  Filled 2017-09-01 (×4): qty 2

## 2017-09-01 MED ORDER — GABAPENTIN 100 MG PO CAPS
200.0000 mg | ORAL_CAPSULE | Freq: Every day | ORAL | Status: DC
  Administered 2017-09-01 – 2017-09-03 (×3): 200 mg via ORAL
  Filled 2017-09-01 (×3): qty 2

## 2017-09-01 MED ORDER — HEPARIN SODIUM (PORCINE) 5000 UNIT/ML IJ SOLN
5000.0000 [IU] | Freq: Three times a day (TID) | INTRAMUSCULAR | Status: DC
  Administered 2017-09-01 – 2017-09-12 (×28): 5000 [IU] via SUBCUTANEOUS
  Filled 2017-09-01 (×30): qty 1

## 2017-09-01 MED ORDER — VANCOMYCIN HCL 10 G IV SOLR
1250.0000 mg | INTRAVENOUS | Status: DC
  Filled 2017-09-01: qty 1250

## 2017-09-01 MED ORDER — FUROSEMIDE 20 MG PO TABS
40.0000 mg | ORAL_TABLET | Freq: Every day | ORAL | Status: DC
  Administered 2017-09-03 – 2017-09-09 (×5): 40 mg via ORAL
  Filled 2017-09-01 (×6): qty 1

## 2017-09-01 MED ORDER — FOLIC ACID 1 MG PO TABS
1.0000 mg | ORAL_TABLET | ORAL | Status: DC
  Administered 2017-09-02 – 2017-09-06 (×5): 1 mg via ORAL
  Filled 2017-09-01 (×6): qty 1

## 2017-09-01 MED ORDER — ASPIRIN EC 81 MG PO TBEC
81.0000 mg | DELAYED_RELEASE_TABLET | Freq: Every day | ORAL | Status: DC
  Administered 2017-09-03 – 2017-09-06 (×3): 81 mg via ORAL
  Filled 2017-09-01 (×5): qty 1

## 2017-09-01 MED ORDER — CARBIDOPA-LEVODOPA 25-100 MG PO TABS
1.0000 | ORAL_TABLET | Freq: Two times a day (BID) | ORAL | Status: DC
Start: 2017-09-01 — End: 2017-09-10
  Administered 2017-09-01 – 2017-09-09 (×12): 1 via ORAL
  Filled 2017-09-01 (×18): qty 1

## 2017-09-01 MED ORDER — DOCUSATE SODIUM 100 MG PO CAPS
100.0000 mg | ORAL_CAPSULE | Freq: Two times a day (BID) | ORAL | Status: DC
  Administered 2017-09-01 – 2017-09-08 (×8): 100 mg via ORAL
  Filled 2017-09-01 (×13): qty 1

## 2017-09-01 MED ORDER — ALUM & MAG HYDROXIDE-SIMETH 200-200-20 MG/5ML PO SUSP
30.0000 mL | Freq: Four times a day (QID) | ORAL | Status: DC | PRN
  Filled 2017-09-01: qty 30

## 2017-09-01 MED ORDER — DOCUSATE SODIUM 100 MG PO CAPS: 100.0000 mg | ORAL_CAPSULE | Freq: Two times a day (BID) | ORAL | Status: DC | PRN

## 2017-09-01 MED ORDER — VITAMIN C 500 MG PO TABS
250.0000 mg | ORAL_TABLET | Freq: Two times a day (BID) | ORAL | Status: DC
  Administered 2017-09-01 – 2017-09-08 (×12): 250 mg via ORAL
  Filled 2017-09-01 (×18): qty 0.5

## 2017-09-01 MED ORDER — VANCOMYCIN HCL IN DEXTROSE 1-5 GM/200ML-% IV SOLN
1000.0000 mg | Freq: Once | INTRAVENOUS | Status: AC
  Administered 2017-09-01: 1000 mg via INTRAVENOUS
  Filled 2017-09-01: qty 200

## 2017-09-01 MED ORDER — INSULIN GLARGINE 100 UNIT/ML ~~LOC~~ SOLN
25.0000 [IU] | Freq: Every day | SUBCUTANEOUS | Status: DC
  Filled 2017-09-01: qty 0.25

## 2017-09-01 MED ORDER — ARIPIPRAZOLE 5 MG PO TABS
2.5000 mg | ORAL_TABLET | Freq: Two times a day (BID) | ORAL | Status: DC
  Administered 2017-09-01 – 2017-09-09 (×12): 2.5 mg via ORAL
  Filled 2017-09-01 (×18): qty 1

## 2017-09-01 MED ORDER — PANTOPRAZOLE SODIUM 40 MG PO TBEC
40.0000 mg | DELAYED_RELEASE_TABLET | Freq: Every day | ORAL | Status: DC
  Administered 2017-09-03 – 2017-09-09 (×5): 40 mg via ORAL
  Filled 2017-09-01 (×6): qty 1

## 2017-09-01 MED ORDER — DIPHENHYDRAMINE HCL 25 MG PO CAPS
25.0000 mg | ORAL_CAPSULE | Freq: Four times a day (QID) | ORAL | Status: DC | PRN
  Administered 2017-09-01: 25 mg via ORAL
  Filled 2017-09-01: qty 1

## 2017-09-01 MED ORDER — METHOTREXATE 2.5 MG PO TABS
15.0000 mg | ORAL_TABLET | ORAL | Status: DC
  Administered 2017-09-01 – 2017-09-08 (×2): 15 mg via ORAL
  Filled 2017-09-01 (×2): qty 6

## 2017-09-01 MED ORDER — LAMOTRIGINE 25 MG PO TABS
100.0000 mg | ORAL_TABLET | Freq: Two times a day (BID) | ORAL | Status: DC
  Administered 2017-09-01 – 2017-09-09 (×12): 100 mg via ORAL
  Filled 2017-09-01 (×15): qty 4

## 2017-09-01 MED ORDER — DULOXETINE HCL 30 MG PO CPEP
60.0000 mg | ORAL_CAPSULE | Freq: Every day | ORAL | Status: DC
  Administered 2017-09-03 – 2017-09-09 (×5): 60 mg via ORAL
  Filled 2017-09-01 (×9): qty 1

## 2017-09-01 MED ORDER — ENSURE ENLIVE PO LIQD
237.0000 mL | Freq: Two times a day (BID) | ORAL | Status: DC
  Administered 2017-09-03 – 2017-09-09 (×8): 237 mL via ORAL

## 2017-09-01 MED ORDER — POLYVINYL ALCOHOL 1.4 % OP SOLN
1.0000 [drp] | Freq: Three times a day (TID) | OPHTHALMIC | Status: DC
  Administered 2017-09-01 – 2017-09-14 (×32): 1 [drp] via OPHTHALMIC
  Filled 2017-09-01 (×3): qty 15

## 2017-09-01 MED ORDER — POLYETHYLENE GLYCOL 3350 17 G PO PACK
17.0000 g | PACK | Freq: Two times a day (BID) | ORAL | Status: DC
  Administered 2017-09-01 – 2017-09-07 (×6): 17 g via ORAL
  Filled 2017-09-01 (×11): qty 1

## 2017-09-01 MED ORDER — BISACODYL 10 MG RE SUPP: 10.0000 mg | Freq: Every day | RECTAL | Status: DC | PRN

## 2017-09-01 NOTE — ED Provider Notes (Signed)
[facility] Emergency Department Provider Note  ____________________________________________  Time seen: Approximately 5:00 PM  I have reviewed the triage vital signs and the nursing notes.   HISTORY  Chief Complaint Wound Check  Level 5 Caveat: Portions of the History and Physical are unable to be obtained due to patient being a poor historian   HPI Maureen Taylor is a 82 y.o. female sent to the ED from [facility] for evaluation of her right foot. She has a chronic diabetic ulcer on the heel which has [facility] getting [facility], but the doctor there today felt like it needed debridement and sent her to the ED for evaluation. She denies fevers chills or sweats. Has right heel pain, worse with weightbearing, no other complaints at this time.     Past Medical History:  Diagnosis Date  . Acute encephalopathy   . Allergic rhinitis   . Ascorbic acid deficiency   . Bipolar affective disorder (HCC)   . Candidiasis   . Charcot's joint   . Chronic constipation   . Dysuria   . GERD (gastroesophageal reflux disease)   . History of falling   . HLD (hyperlipidemia)   . HTN (hypertension)   . Magnesium deficiency   . Major depressive disorder   . Mixed incontinence   . Morbid obesity (HCC)   . Muscle weakness   . Neuropathic pain   . Neuropathy   . OAB (overactive bladder)   . Obesity   . Oral pain   . Parkinson disease (HCC)   . Pneumonia   . Protein calorie malnutrition (HCC)   . Rosacea   . Sepsis (HCC) 09/2016  . Slow transit constipation   . Type 2 diabetes mellitus with diabetic polyneuropathy, with long-term current use of insulin (HCC)   . Urinary tract infection without hematuria   . Vaginal atrophy   . Vitamin deficiency      Patient Active Problem List   Diagnosis Date Noted  . Pressure injury of skin 06/20/2017  . SIRS (systemic inflammatory response syndrome) (HCC) 06/17/2017  . Sepsis (HCC) 10/05/2016  . Acute  encephalopathy 10/05/2016  . Diabetes mellitus with complication (HCC)   . Complicated UTI (urinary tract infection)   . Acute cystitis without hematuria   . Morbid obesity (HCC)   . UTI (urinary tract infection) 08/24/2016  . Altered mental status 08/24/2016  . Constipation 08/24/2016  . Hyperlipidemia 06/05/2015  . Type 2 diabetes mellitus with diabetic polyneuropathy (HCC) 06/05/2015  . Vitamin D deficiency 06/05/2015  . Recurrent UTI 04/17/2015  . Vaginal atrophy 04/17/2015  . Incontinence 04/17/2015  . OAB (overactive bladder) 03/03/2015  . Bipolar depression (HCC) 01/05/2015  . GERD (gastroesophageal reflux disease) 01/05/2015  . Allergic rhinitis 01/05/2015  . Essential hypertension 01/05/2015  . Candida infection 01/05/2015  . Parkinson's disease (HCC) 01/05/2015  . Neuropathy 01/05/2015  . Diabetes mellitus with neuropathy (HCC) 01/05/2015     Past Surgical History:  Procedure Laterality Date  . ABDOMINAL HYSTERECTOMY    . CERVICAL CONIZATION W/BX    . CHOLECYSTECTOMY    . REVISION TOTAL KNEE ARTHROPLASTY Right      Prior to Admission medications   Medication Sig Start Date End Date Taking? Authorizing Provider  acetaminophen (TYLENOL) 500 MG tablet Take 1,000 mg by mouth every 8 (eight) hours as needed.   Yes [provider]  ARIPiprazole (ABILIFY) 5 MG tablet Take 2.5 mg by mouth 2 (two) times daily.    Yes  [provider]  ascorbic acid (VITAMIN C) 250 MG tablet Take 250 mg by mouth 2 (two) times daily.   Yes [provider]  aspirin 81 MG EC tablet Take 81 mg by mouth daily. Swallow whole.   Yes [provider]  atorvastatin (LIPITOR) 20 MG tablet Take 20 mg by mouth daily.    Yes [provider]  carbidopa-levodopa (SINEMET IR) 25-100 MG tablet Take 1 tablet by mouth 2 (two) times daily.    Yes [provider]  carboxymethylcellulose 1 % ophthalmic solution Apply 1 drop to eye 3 (three) times daily.    Yes  [provider]  cefTRIAXone (ROCEPHIN) IVPB Inject 1 g into the vein daily. 08/30/17 08/29/2017 Yes [provider]  clindamycin (CLEOCIN) 300 MG capsule Take 300 mg by mouth every 6 (six) hours. 08/29/17 09/11/2017 Yes [provider]  Cranberry 425 MG CAPS Take 1 capsule by mouth daily.    Yes [provider]  docusate sodium (COLACE) 100 MG capsule Take 1 capsule (100 mg total) by mouth 2 (two) times daily. 06/24/17  Yes Altamese DillingVachhani, Vaibhavkumar, MD  DULoxetine (CYMBALTA) 60 MG capsule Take 60 mg by mouth daily.   Yes [provider]  folic acid (FOLVITE) 1 MG tablet Take 1 mg by mouth. Saturday through Thursday   Yes [provider]  furosemide (LASIX) 40 MG tablet Take 1 tablet (40 mg total) by mouth daily. 07/12/17 07/12/18 Yes Rockne MenghiniNorman, Anne-Caroline, MD  gabapentin (NEURONTIN) 100 MG capsule Take 200 mg by mouth at bedtime.    Yes [provider]  insulin glargine (LANTUS) 100 UNIT/ML injection Inject 0.35 mLs (35 Units total) into the skin 2 (two) times daily. Patient taking differently: Inject 25 Units into the skin daily.  06/24/17  Yes Altamese DillingVachhani, Vaibhavkumar, MD  insulin lispro (HUMALOG) 100 UNIT/ML injection Inject 2-12 Units into the skin See admin instructions. Sliding Scale   Yes [provider]  lamoTRIgine (LAMICTAL) 100 MG tablet Take 100 mg by mouth 2 (two) times daily.    Yes [provider]  linaclotide (LINZESS) 145 MCG CAPS capsule Take 145 mcg by mouth daily before breakfast.   Yes [provider]  lisinopril (PRINIVIL,ZESTRIL) 20 MG tablet Take 20 mg by mouth daily.    Yes [provider]  Magnesium 400 MG CAPS Take 1 capsule by mouth daily.   Yes [provider]  methotrexate (RHEUMATREX) 2.5 MG tablet Take 15 mg by mouth every Friday.   Yes [provider]  metoprolol tartrate (LOPRESSOR) 25 MG tablet Take 25 mg by mouth 2 (two) times daily. Take 25 mg by mouth twice  a day for HTN   Yes [provider]  multivitamin-lutein (OCUVITE-LUTEIN) CAPS capsule Take 1 capsule by mouth daily.   Yes [provider]  pantoprazole (PROTONIX) 40 MG tablet Take 40 mg by mouth daily.    Yes [provider]  polyethylene glycol (MIRALAX / GLYCOLAX) packet Take 17 g by mouth 2 (two) times daily.   Yes [provider]  sennosides-docusate sodium (SENOKOT-S) 8.6-50 MG tablet Take 2 tablets by mouth 2 (two) times daily.   Yes [provider]  alum & mag hydroxide-simeth (MAALOX/MYLANTA) 200-200-20 MG/5ML suspension Take 30 mLs by mouth every 6 (six) hours as needed for indigestion or heartburn. 10/09/16   Hongalgi, Maximino GreenlandAnand D, MD  bisacodyl (DULCOLAX) 10 MG suppository Place 1 suppository (10 mg total) rectally daily as needed for moderate constipation. 06/24/17   Altamese DillingVachhani, Vaibhavkumar, MD  conjugated estrogens (PREMARIN) vaginal cream Place 1 Applicatorful vaginally every Monday, Wednesday, and Friday. Apply 0.5mg  (pea-sized amount)  just inside the vaginal introitus with a finger-tip every Monday, Wednesday and Friday nights. 10/10/16   Hongalgi, Maximino GreenlandAnand D, MD  feeding supplement, ENSURE ENLIVE, (ENSURE ENLIVE) LIQD Take 237 mLs by mouth 2 (two) times daily between meals. 06/24/17   Altamese DillingVachhani, Vaibhavkumar, MD  ipratropium-albuterol (DUONEB) 0.5-2.5 (3) MG/3ML SOLN Take 3 mLs by nebulization every 4 (four) hours as needed (wheezing). 06/24/17   Altamese DillingVachhani, Vaibhavkumar, MD  predniSONE (STERAPRED UNI-PAK 21 TAB) 10 MG (21) TBPK tablet Take 6 tabs first day, 5 tab on day 2, then 4 on day 3rd, 3 tabs on day 4th , 2 tab on day 5th, and 1 tab on 6th day. Patient not taking: Reported on 08/28/2017 06/24/17   Altamese DillingVachhani, Vaibhavkumar, MD     Allergies Codeine; Morphine and related; and Indocin [indomethacin]   Family History  Problem Relation Age of Onset  . Colon cancer Father   . Colon cancer Sister   . Stomach cancer Sister   . Rectal cancer Sister    . Kidney disease Neg Hx   . Bladder Cancer Neg Hx   . Prostate cancer Neg Hx     Social History Social History   Tobacco Use  . Smoking status: Never Smoker  . Smokeless tobacco: Never Used  Substance Use Topics  . Alcohol use: No    Alcohol/week: 0.0 oz  . Drug use: No    Review of Systems  Constitutional:   No fever or chills.  Cardiovascular:   No chest pain or syncope. Respiratory:   No dyspnea or cough. Gastrointestinal:   Negative for abdominal pain, vomiting and diarrhea.  Musculoskeletal:   Right foot pain as above All other systems reviewed and are negative except as documented above in ROS and HPI.  ____________________________________________   PHYSICAL EXAM:  VITAL SIGNS: ED Triage Vitals [08/31/2017 1550]  Enc Vitals Group     BP (!) 111/59     Pulse Rate 85     Resp 16     Temp 97.6 F (36.4 C)     Temp Source Oral     SpO2 97 %     Weight      Height      Head Circumference      [facility] Flow      Pain Score      Pain Loc      Pain Edu?      Excl. in GC?     Vital signs reviewed, nursing assessments reviewed.   Constitutional:   Alert and oriented. Not in distress Eyes:   No scleral icterus.  EOMI. No nystagmus. No conjunctival pallor. PERRL. ENT   Head:   Normocephalic and atraumatic.   Nose:   No congestion/rhinnorhea.    Mouth/Throat:   MMM, no pharyngeal erythema. No peritonsillar mass.    Neck:   No meningismus. Full ROM. Hematological/Lymphatic/Immunilogical:   No cervical lymphadenopathy. Cardiovascular:   RRR. Symmetric bilateral radial and DP pulses.  No murmurs.  Respiratory:   Normal respiratory effort without tachypnea/retractions. Breath sounds are clear and equal bilaterally. No wheezes/rales/rhonchi. Gastrointestinal:   Soft and nontender. Non distended. There is no CVA tenderness.  No rebound, rigidity, or guarding. Genitourinary:   deferred Musculoskeletal:   Normal range of motion in all extremities. No joint  effusions.  Tenderness and swelling around the right calcaneus. There is a 6 cm rounded soft tissue ulcer  overlying the calcaneus, unstageable due to necrotic tissue throughout the wound. Edges are somewhat undermined. No purulent drainage. No crepitus. Neurologic:   Normal speech and language.  Motor grossly intact. No gross focal neurologic deficits are appreciated.  Skin:    Skin is warm, dry with necrotic wound as above.  ____________________________________________    LABS (pertinent positives/negatives) (all labs ordered are listed, but only abnormal results are displayed) Labs Reviewed  CULTURE, BLOOD (ROUTINE X 2)  CULTURE, BLOOD (ROUTINE X 2)  CBC WITH DIFFERENTIAL/PLATELET  COMPREHENSIVE METABOLIC PANEL  LACTIC ACID, PLASMA  LACTIC ACID, PLASMA   ____________________________________________   EKG    ____________________________________________    RADIOLOGY  Dg Foot 2 Views Right  Result Date: 08/28/2017 CLINICAL DATA:  Soft tissue wound of the heel. EXAM: RIGHT FOOT - 2 VIEW COMPARISON:  06/06/2011 foot and 05/10/2016 ankle radiographs. FINDINGS: Soft tissue ulceration posterior to the calcaneus with soft tissue ossifications consistent with bony sequestra and new bone destruction involving the posterior calcaneal enthesophyte since prior study. Intact plantar calcaneal enthesopathy. No acute fracture of the foot. IMPRESSION: Findings in keeping with osteomyelitis of the posterior calcaneus with overlying soft tissue ulceration and adjacent bony sequestra. Findings of both acute and chronic components of osteomyelitis. Electronically Signed   By: Tollie Eth M.D.   On: 09/13/2017 16:51    ____________________________________________   PROCEDURES Procedures  ____________________________________________   DIFFERENTIAL DIAGNOSIS  Cellulitis, abscess, osteomyelitis, necrotic wound  CLINICAL IMPRESSION / ASSESSMENT AND PLAN / ED COURSE  Pertinent labs &  imaging results that were available during my care of the patient were reviewed by me and considered in my medical decision making (see chart for details).   Patient presents with unremarkable vital signs, but clearly unstageable necrotic wound of her diabetic foot ulcer. Inflammatory changes with rolled edges as well, high suspicion for her ongoing infection despite being on ceftriaxone and clindamycin outpatient. With failure of outpatient treatment ongoing worsening wound, obtain labs and an x-ray. The x-ray does reveal evidence of acute osteomyelitis. Patient will require admission for surgical debridement and further antibiotic management. I'll discuss with the hospitalist.      ____________________________________________   FINAL CLINICAL IMPRESSION(S) / ED DIAGNOSES    Final diagnoses:  Diabetic ulcer of right heel associated with type 2 diabetes mellitus, with necrosis of muscle (HCC)  Other acute osteomyelitis of right foot (HCC)      This SmartLink is deprecated. Use AVSMEDLIST instead to display the medication list for a patient.   Portions of this note were generated with dragon dictation software. Dictation errors may occur despite best attempts at proofreading.    Sharman Cheek, MD 09/07/2017 [phone]

## 2017-09-01 NOTE — H&P (Signed)
[facility]   PATIENT NAME: Maureen Taylor    MR#:  [id number]  DATE OF BIRTH:  03-24-1935  DATE OF ADMISSION:  09/08/2017  PRIMARY CARE PHYSICIAN: Dorothey Baseman, MD   REQUESTING/REFERRING PHYSICIAN: Scotty Court  CHIEF COMPLAINT:   Chief Complaint  Patient presents with  . Wound Check    HISTORY OF PRESENT ILLNESS: Maureen Taylor  is a 82 y.o. female with a known history of Charcot's joint, GERD, HTn, DM, HLD, Obesity, neuropathy, Osteomyelitis on foot- treated with doxy for 14 days in Sept 2018- sent to [facility], cont to have repeated Inf- also was given Rocephin and Clindamycin for last few days, still no improvement, so sent to ER. On Xray noted acute on CH osteomyelitis. Given for IV ABx as failed out pt Abx. She is bed/ wheel chair bound at baseline now.  PAST MEDICAL HISTORY:   Past Medical History:  Diagnosis Date  . Acute encephalopathy   . Allergic rhinitis   . Ascorbic acid deficiency   . Bipolar affective disorder (HCC)   . Candidiasis   . Charcot's joint   . Chronic constipation   . Dysuria   . GERD (gastroesophageal reflux disease)   . History of falling   . HLD (hyperlipidemia)   . HTN (hypertension)   . Magnesium deficiency   . Major depressive disorder   . Mixed incontinence   . Morbid obesity (HCC)   . Muscle weakness   . Neuropathic pain   . Neuropathy   . OAB (overactive bladder)   . Obesity   . Oral pain   . Parkinson disease (HCC)   . Pneumonia   . Protein calorie malnutrition (HCC)   . Rosacea   . Sepsis (HCC) 09/2016  . Slow transit constipation   . Type 2 diabetes mellitus with diabetic polyneuropathy, with long-term current use of insulin (HCC)   . Urinary tract infection without hematuria   . Vaginal atrophy   . Vitamin deficiency     PAST SURGICAL HISTORY:  Past Surgical History:  Procedure Laterality Date  . ABDOMINAL HYSTERECTOMY    . CERVICAL CONIZATION W/BX    . CHOLECYSTECTOMY    .  REVISION TOTAL KNEE ARTHROPLASTY Right     SOCIAL HISTORY:  Social History   Tobacco Use  . Smoking status: Never Smoker  . Smokeless tobacco: Never Used  Substance Use Topics  . Alcohol use: No    Alcohol/week: 0.0 oz    FAMILY HISTORY:  Family History  Problem Relation Age of Onset  . Colon cancer Father   . Colon cancer Sister   . Stomach cancer Sister   . Rectal cancer Sister   . Kidney disease Neg Hx   . Bladder Cancer Neg Hx   . Prostate cancer Neg Hx     DRUG ALLERGIES:  Allergies  Allergen Reactions  . Codeine Itching  . Morphine And Related Itching  . Indocin [Indomethacin] Rash    REVIEW OF SYSTEMS:   CONSTITUTIONAL: No fever, fatigue or weakness.  EYES: No blurred or double vision.  EARS, NOSE, AND THROAT: No tinnitus or ear pain.  RESPIRATORY: No cough, shortness of breath, wheezing or hemoptysis.  CARDIOVASCULAR: No chest pain, orthopnea, edema.  GASTROINTESTINAL: No nausea, vomiting, diarrhea or abdominal pain.  GENITOURINARY: No dysuria, hematuria.  ENDOCRINE: No polyuria, nocturia,  HEMATOLOGY: No anemia, easy bruising or bleeding SKIN: No rash or lesion. MUSCULOSKELETAL: No joint pain or arthritis.   NEUROLOGIC: No tingling, numbness,  weakness.  PSYCHIATRY: No anxiety or depression.   MEDICATIONS AT HOME:  Prior to Admission medications   Medication Sig Start Date End Date Taking? Authorizing Provider  acetaminophen (TYLENOL) 500 MG tablet Take 1,000 mg by mouth every 8 (eight) hours as needed.   Yes [provider]  ARIPiprazole (ABILIFY) 5 MG tablet Take 2.5 mg by mouth 2 (two) times daily.    Yes [provider]  ascorbic acid (VITAMIN C) 250 MG tablet Take 250 mg by mouth 2 (two) times daily.   Yes [provider]  aspirin 81 MG EC tablet Take 81 mg by mouth daily. Swallow whole.   Yes [provider]  atorvastatin (LIPITOR) 20 MG tablet Take 20 mg by mouth daily.    Yes [provider]   carbidopa-levodopa (SINEMET IR) 25-100 MG tablet Take 1 tablet by mouth 2 (two) times daily.    Yes [provider]  carboxymethylcellulose 1 % ophthalmic solution Apply 1 drop to eye 3 (three) times daily.    Yes [provider]  cefTRIAXone (ROCEPHIN) IVPB Inject 1 g into the vein daily. 08/30/17 01/09/2017 Yes [provider]  clindamycin (CLEOCIN) 300 MG capsule Take 300 mg by mouth every 6 (six) hours. 08/29/17 01/09/2017 Yes [provider]  Cranberry 425 MG CAPS Take 1 capsule by mouth daily.    Yes [provider]  docusate sodium (COLACE) 100 MG capsule Take 1 capsule (100 mg total) by mouth 2 (two) times daily. 06/24/17  Yes Altamese DillingVachhani, Quanell Loughney, MD  DULoxetine (CYMBALTA) 60 MG capsule Take 60 mg by mouth daily.   Yes [provider]  folic acid (FOLVITE) 1 MG tablet Take 1 mg by mouth. Saturday through Thursday   Yes [provider]  furosemide (LASIX) 40 MG tablet Take 1 tablet (40 mg total) by mouth daily. 07/12/17 07/12/18 Yes Rockne MenghiniNorman, Anne-Caroline, MD  gabapentin (NEURONTIN) 100 MG capsule Take 200 mg by mouth at bedtime.    Yes [provider]  insulin glargine (LANTUS) 100 UNIT/ML injection Inject 0.35 mLs (35 Units total) into the skin 2 (two) times daily. Patient taking differently: Inject 25 Units into the skin daily.  06/24/17  Yes Altamese DillingVachhani, Faythe Heitzenrater, MD  insulin lispro (HUMALOG) 100 UNIT/ML injection Inject 2-12 Units into the skin See admin instructions. Sliding Scale   Yes [provider]  lamoTRIgine (LAMICTAL) 100 MG tablet Take 100 mg by mouth 2 (two) times daily.    Yes [provider]  linaclotide (LINZESS) 145 MCG CAPS capsule Take 145 mcg by mouth daily before breakfast.   Yes [provider]  lisinopril (PRINIVIL,ZESTRIL) 20 MG tablet Take 20 mg by mouth daily.    Yes [provider]  Magnesium 400 MG CAPS Take 1 capsule by mouth daily.   Yes [provider]  methotrexate (RHEUMATREX) 2.5 MG tablet Take 15 mg by mouth every Friday.   Yes [provider]  metoprolol tartrate (LOPRESSOR) 25 MG tablet Take 25 mg by mouth 2 (two) times daily. Take 25 mg by mouth twice a day for HTN   Yes [provider]  multivitamin-lutein (OCUVITE-LUTEIN) CAPS capsule Take 1 capsule by mouth daily.   Yes [provider]  pantoprazole (PROTONIX) 40 MG tablet Take 40 mg by mouth daily.    Yes [provider]  polyethylene glycol (MIRALAX / GLYCOLAX) packet Take 17 g by mouth 2 (two) times daily.   Yes [provider]  sennosides-docusate sodium (SENOKOT-S) 8.6-50 MG tablet Take  2 tablets by mouth 2 (two) times daily.   Yes [provider]  alum & mag hydroxide-simeth (MAALOX/MYLANTA) 200-200-20 MG/5ML suspension Take 30 mLs by mouth every 6 (six) hours as needed for indigestion or heartburn. 10/09/16   Hongalgi, Maximino Greenland, MD  bisacodyl (DULCOLAX) 10 MG suppository Place 1 suppository (10 mg total) rectally daily as needed for moderate constipation. 06/24/17   Altamese Dilling, MD  conjugated estrogens (PREMARIN) vaginal cream Place 1 Applicatorful vaginally every Monday, Wednesday, and Friday. Apply 0.5mg  (pea-sized amount)  just inside the vaginal introitus with a finger-tip every Monday, Wednesday and Friday nights. 10/10/16   Hongalgi, Maximino Greenland, MD  feeding supplement, ENSURE ENLIVE, (ENSURE ENLIVE) LIQD Take 237 mLs by mouth 2 (two) times daily between meals. 06/24/17   Altamese Dilling, MD  ipratropium-albuterol (DUONEB) 0.5-2.5 (3) MG/3ML SOLN Take 3 mLs by nebulization every 4 (four) hours as needed (wheezing). 06/24/17   Altamese Dilling, MD  predniSONE (STERAPRED UNI-PAK 21 TAB) 10 MG (21) TBPK tablet Take 6 tabs first day, 5 tab on day 2, then 4 on day 3rd, 3 tabs on day 4th , 2 tab on day 5th, and 1 tab on 6th day. Patient not taking: Reported on 09/17/2017 06/24/17   Altamese Dilling, MD      PHYSICAL EXAMINATION:   VITAL SIGNS: Blood pressure (!) 111/59, pulse 85, temperature 97.6 F (36.4 C), temperature source Oral, resp. rate 16, SpO2 97 %.  GENERAL:  82 y.o.-year-old obese patient lying in the bed with no acute distress.  EYES: Pupils equal, round, reactive to light and accommodation. No scleral icterus. Extraocular muscles intact.  HEENT: Head atraumatic, normocephalic. Oropharynx and nasopharynx clear.  NECK:  Supple, no jugular venous distention. No thyroid enlargement, no tenderness.  LUNGS: Normal breath sounds bilaterally, no wheezing, rales,rhonchi or crepitation. No use of accessory muscles of respiration.  CARDIOVASCULAR: S1, S2 normal. No murmurs, rubs, or gallops.  ABDOMEN: Soft, nontender, nondistended. Bowel sounds present. No organomegaly or mass.  EXTREMITIES: right heel- have dark colour, foul smelling pressure point with surrounding redness on the whole heel area.  NEUROLOGIC: Cranial nerves II through XII are intact. Muscle strength 3/5 in all extremities. Sensation intact. Gait not checked.  PSYCHIATRIC: The patient is alert and oriented x 3.  SKIN: No obvious rash, lesion, or ulcer.   LABORATORY PANEL:   CBC No results for input(s): WBC, HGB, HCT, PLT, MCV, MCH, MCHC, RDW, LYMPHSABS, MONOABS, EOSABS, BASOSABS, BANDABS in the last 168 hours.  Invalid input(s): NEUTRABS, BANDSABD ------------------------------------------------------------------------------------------------------------------  Chemistries  No results for input(s): NA, K, CL, CO2, GLUCOSE, BUN, CREATININE, CALCIUM, MG, AST, ALT, ALKPHOS, BILITOT in the last 168 hours.  Invalid input(s): GFRCGP ------------------------------------------------------------------------------------------------------------------ CrCl cannot be calculated (Patient's most recent lab result is older than the maximum 21 days  allowed.). ------------------------------------------------------------------------------------------------------------------ No results for input(s): TSH, T4TOTAL, T3FREE, THYROIDAB in the last 72 hours.  Invalid input(s): FREET3   Coagulation profile No results for input(s): INR, PROTIME in the last 168 hours. ------------------------------------------------------------------------------------------------------------------- No results for input(s): DDIMER in the last 72 hours. -------------------------------------------------------------------------------------------------------------------  Cardiac Enzymes No results for input(s): CKMB, TROPONINI, MYOGLOBIN in the last 168 hours.  Invalid input(s): CK ------------------------------------------------------------------------------------------------------------------ Invalid input(s): POCBNP  ---------------------------------------------------------------------------------------------------------------  Urinalysis    Component Value Date/Time   COLORURINE YELLOW (A) 06/20/2017 1450   APPEARANCEUR HAZY (A) 06/20/2017 1450   APPEARANCEUR Cloudy (A) 02/25/2016 1548   LABSPEC 1.019 06/20/2017 1450   LABSPEC 1.017 10/30/2014 0830   PHURINE 5.0 06/20/2017 1450  GLUCOSEU NEGATIVE 06/20/2017 1450   GLUCOSEU Negative 10/30/2014 0830   HGBUR NEGATIVE 06/20/2017 1450   BILIRUBINUR NEGATIVE 06/20/2017 1450   BILIRUBINUR Negative 02/25/2016 1548   BILIRUBINUR Negative 10/30/2014 0830   KETONESUR NEGATIVE 06/20/2017 1450   PROTEINUR 100 (A) 06/20/2017 1450   NITRITE NEGATIVE 06/20/2017 1450   LEUKOCYTESUR SMALL (A) 06/20/2017 1450   LEUKOCYTESUR 3+ (A) 02/25/2016 1548   LEUKOCYTESUR 2+ 10/30/2014 0830     RADIOLOGY: Dg Foot 2 Views Right  Result Date: 09/24/2017 CLINICAL DATA:  Soft tissue wound of the heel. EXAM: RIGHT FOOT - 2 VIEW COMPARISON:  06/06/2011 foot and 05/10/2016 ankle radiographs. FINDINGS: Soft tissue ulceration  posterior to the calcaneus with soft tissue ossifications consistent with bony sequestra and new bone destruction involving the posterior calcaneal enthesophyte since prior study. Intact plantar calcaneal enthesopathy. No acute fracture of the foot. IMPRESSION: Findings in keeping with osteomyelitis of the posterior calcaneus with overlying soft tissue ulceration and adjacent bony sequestra. Findings of both acute and chronic components of osteomyelitis. Electronically Signed   By: Tollie Ethavid  Kwon M.D.   On: 09/23/2017 16:51    EKG: Orders placed or performed during the hospital encounter of 07/12/17  . ED EKG  . ED EKG  . EKG 12-Lead  . EKG 12-Lead    IMPRESSION AND PLAN:  * Ac on Ch osteomyelitis   IV Vanc+ Zosyn    Will call ID and podiatry consult.  * DM   Cont lantus   ISS  * Htn   Hold meds, as BP is normal and have infection.    Monitor.  * COPD   No exacerbation, cont Nebs  * Neuropathy   Cont gabapentin.  All the records are reviewed and case discussed with ED provider. Management plans discussed with the patient, family and they are in agreement.  CODE STATUS: Full. Code Status History    Date Active Date Inactive Code Status Order ID Comments User Context   06/17/2017 15:52 06/24/2017 19:37 Full Code [id number]  Marguarite ArbourSparks, Jeffrey D, MD Inpatient   10/05/2016 12:17 10/09/2016 18:37 Full Code [id number]  Gwenyth BenderBlack, Karen M, NP ED   08/24/2016 20:19 08/27/2016 19:12 Full Code [id number]  Narda BondsNettey, Ralph A, MD Inpatient    Advance Directive Documentation     Most Recent Value  Type of Advance Directive  Out of facility DNR (pink MOST or yellow form)  Pre-existing out of facility DNR order (yellow form or pink MOST form)  Pink MOST form placed in chart (order not valid for inpatient use)  "MOST" Form in Place?  No data     Her boyfriend present in room.  TOTAL TIME TAKING CARE OF THIS PATIENT: 45 minutes.    Altamese DillingVaibhavkumar Larena Ohnemus M.D on 09/17/2017   Between 7am to 6pm - Pager  - [phone]  After 6pm go to [email or web address] - password [email or web address]  [facility]  Office  [phone]  CC: Primary care physician; Dorothey BasemanBronstein, David, MD   Note: This dictation was prepared with Dragon dictation along with smaller phrase technology. Any transcriptional errors that result from this process are unintentional.

## 2017-09-01 NOTE — ED Triage Notes (Signed)
Pt brought in by [facility] from [facility] for evaluation of her right foot. Pt has wound on foot and has been seeing wound clinic, pt currently on rocephin and clindamycin, doctor from [facility] would like pt evaluated for possible debridement. Pt in NAD at this time

## 2017-09-01 NOTE — Progress Notes (Signed)
Patient complaining of itching. MD notified. Orders received.

## 2017-09-01 NOTE — Progress Notes (Signed)
Pt with Red skin head to toe. Spoke with Dr. Katheren ShamsSalary. Discontinue vancomycin and zosyn MD to place new antibiotic orders.

## 2017-09-01 NOTE — ED Notes (Signed)
Patient denies pain and is resting comfortably.  

## 2017-09-01 NOTE — Progress Notes (Addendum)
Pharmacy Antibiotic Note  Maureen Taylor is a 82 y.o. female admitted on 09/03/2017 with cellulitis.  Pharmacy has been consulted for vancomycin and zosyn dosing.  Plan: Vancomycin 1250mg  IV every 48h hours.  Goal trough 10-15 mcg/mL. Zosyn 3.375g IV q8h (4 hour infusion). Vancomycin trough 12/12@0130   Weight: 199 lb 9.6 oz (90.5 kg)  Temp (24hrs), Avg:97.6 F (36.4 C), Min:97.6 F (36.4 C), Max:97.6 F (36.4 C)  Recent Labs  Lab 09/08/2017 1731  WBC 18.5*  CREATININE 1.48*    Estimated Creatinine Clearance: 33.7 mL/min (A) (by C-G formula based on SCr of 1.48 mg/dL (H)).    Allergies  Allergen Reactions  . Codeine Itching  . Morphine And Related Itching  . Indocin [Indomethacin] Rash    Antimicrobials this admission: Anti-infectives (From admission, onward)   Start     Dose/Rate Route Frequency Ordered Stop   09/02/17 0200  vancomycin (VANCOCIN) 1,250 mg in sodium chloride 0.9 % 250 mL IVPB     1,250 mg 166.7 mL/hr over 90 Minutes Intravenous Every 48 hours 08/30/2017 1910     09/14/2017 2200  piperacillin-tazobactam (ZOSYN) IVPB 3.375 g     3.375 g 12.5 mL/hr over 240 Minutes Intravenous Every 8 hours 08/29/2017 1907     09/14/2017 1630  piperacillin-tazobactam (ZOSYN) IVPB 3.375 g     3.375 g 100 mL/hr over 30 Minutes Intravenous  Once 09/20/2017 1628     08/26/2017 1630  vancomycin (VANCOCIN) IVPB 1000 mg/200 mL premix     1,000 mg 200 mL/hr over 60 Minutes Intravenous  Once 09/05/2017 1628        Microbiology results: No results found for this or any previous visit (from the past 240 hour(s)).   Thank you for allowing pharmacy to be a part of this patient's care.  Gerre PebblesGarrett Taquana Bartley 09/18/2017 7:10 PM

## 2017-09-02 ENCOUNTER — Inpatient Hospital Stay: Payer: Medicare Other

## 2017-09-02 DIAGNOSIS — L97419 Non-pressure chronic ulcer of right heel and midfoot with unspecified severity: Secondary | ICD-10-CM

## 2017-09-02 DIAGNOSIS — I7090 Unspecified atherosclerosis: Secondary | ICD-10-CM

## 2017-09-02 DIAGNOSIS — I10 Essential (primary) hypertension: Secondary | ICD-10-CM

## 2017-09-02 DIAGNOSIS — E119 Type 2 diabetes mellitus without complications: Secondary | ICD-10-CM

## 2017-09-02 LAB — GLUCOSE, CAPILLARY
GLUCOSE-CAPILLARY: 92 mg/dL (ref 65–99)
Glucose-Capillary: 78 mg/dL (ref 65–99)
Glucose-Capillary: 86 mg/dL (ref 65–99)
Glucose-Capillary: 132 mg/dL — ABNORMAL HIGH (ref 65–99)

## 2017-09-02 LAB — CBC
HEMATOCRIT: 32.6 % — AB (ref 35.0–47.0)
HEMOGLOBIN: 10.8 g/dL — AB (ref 12.0–16.0)
MCH: 29.2 pg (ref 26.0–34.0)
MCHC: 33.2 g/dL (ref 32.0–36.0)
MCV: 88 fL (ref 80.0–100.0)
Platelets: 493 10*3/uL — ABNORMAL HIGH (ref 150–440)
RBC: 3.7 MIL/uL — ABNORMAL LOW (ref 3.80–5.20)
RDW: 15.5 % — AB (ref 11.5–14.5)
WBC: 13.7 10*3/uL — ABNORMAL HIGH (ref 3.6–11.0)

## 2017-09-02 LAB — BASIC METABOLIC PANEL
Anion gap: 8 (ref 5–15)
BUN: 32 mg/dL — AB (ref 6–20)
CHLORIDE: 104 mmol/L (ref 101–111)
CO2: 28 mmol/L (ref 22–32)
Calcium: 9.5 mg/dL (ref 8.9–10.3)
Creatinine, Ser: 1.14 mg/dL — ABNORMAL HIGH (ref 0.44–1.00)
GFR calc Af Amer: 51 mL/min — ABNORMAL LOW (ref >60)
GFR calc non Af Amer: 44 mL/min — ABNORMAL LOW (ref >60)
GLUCOSE: 102 mg/dL — AB (ref 65–99)
POTASSIUM: 4.2 mmol/L (ref 3.5–5.1)
Sodium: 140 mmol/L (ref 135–145)

## 2017-09-02 LAB — MRSA PCR SCREENING: MRSA by PCR: POSITIVE — AB

## 2017-09-02 MED ORDER — SODIUM CHLORIDE 0.9 % IV SOLN
INTRAVENOUS | Status: DC
  Administered 2017-09-04: 01:00:00 via INTRAVENOUS

## 2017-09-02 MED ORDER — CHLORHEXIDINE GLUCONATE CLOTH 2 % EX PADS
6.0000 | MEDICATED_PAD | Freq: Every day | CUTANEOUS | Status: AC
  Administered 2017-09-02 – 2017-09-06 (×4): 6 via TOPICAL

## 2017-09-02 MED ORDER — INSULIN GLARGINE 100 UNIT/ML ~~LOC~~ SOLN
10.0000 [IU] | Freq: Every day | SUBCUTANEOUS | Status: DC
  Administered 2017-09-03: 10 [IU] via SUBCUTANEOUS
  Filled 2017-09-02 (×2): qty 0.1

## 2017-09-02 MED ORDER — CLINDAMYCIN PHOSPHATE 600 MG/50ML IV SOLN
600.0000 mg | Freq: Three times a day (TID) | INTRAVENOUS | Status: DC
Start: 2017-09-02 — End: 2017-09-05
  Administered 2017-09-02 – 2017-09-05 (×10): 600 mg via INTRAVENOUS
  Filled 2017-09-02 (×12): qty 50

## 2017-09-02 MED ORDER — MUPIROCIN 2 % EX OINT
1.0000 "application " | TOPICAL_OINTMENT | Freq: Two times a day (BID) | CUTANEOUS | Status: AC
  Administered 2017-09-02 – 2017-09-06 (×9): 1 via NASAL
  Filled 2017-09-02: qty 22

## 2017-09-02 MED ORDER — LEVOFLOXACIN IN D5W 500 MG/100ML IV SOLN
500.0000 mg | Freq: Once | INTRAVENOUS | Status: AC
  Administered 2017-09-02: 500 mg via INTRAVENOUS
  Filled 2017-09-02: qty 100

## 2017-09-02 MED ORDER — LEVOFLOXACIN IN D5W 250 MG/50ML IV SOLN
250.0000 mg | INTRAVENOUS | Status: DC
  Administered 2017-09-03: 250 mg via INTRAVENOUS
  Filled 2017-09-02 (×2): qty 50

## 2017-09-02 NOTE — Progress Notes (Signed)
Patient ID: Maureen Taylor, female   DOB: 10/21/1934, 82 y.o.   MRN: [id number]  [employer] PROGRESS NOTE  Maureen Taylor:[id number] DOB: 11-07-1934 DOA: 09/21/2017 PCP: Dorothey Baseman, MD  HPI/Subjective: Patient states that she used to walk around a lot before she got her wound on her right heel.  She states is been there for a few weeks as per patient.  It looks like it has been there for a longer period of time.  Foot hurts and she cannot put any weight on it.  Recent removal of 8 teeth.  Objective: Vitals:   09/02/17 0510 09/02/17 0720  BP: (!) 109/51 (!) 100/56  Pulse:  95  Resp: 16 18  Temp: 98.5 F (36.9 C) 98.2 F (36.8 C)  SpO2: 98% 97%    Filed Weights   09/25/2017 1556 09/05/2017 1954  Weight: 90.5 kg (199 lb 9.6 oz) 88.2 kg (194 lb 6.4 oz)    ROS: Review of Systems  Constitutional: Negative for chills and fever.  Eyes: Negative for blurred vision.  Respiratory: Negative for cough and shortness of breath.   Cardiovascular: Negative for chest pain.  Gastrointestinal: Negative for abdominal pain, constipation, diarrhea, nausea and vomiting.  Genitourinary: Negative for dysuria.  Musculoskeletal: Positive for joint pain.  Neurological: Negative for dizziness and headaches.   Exam: Physical Exam  HENT:  Nose: No mucosal edema.  Mouth/Throat: No oropharyngeal exudate or posterior oropharyngeal edema.  Eyes: Conjunctivae, EOM and lids are normal. Pupils are equal, round, and reactive to light.  Neck: No JVD present. Carotid bruit is not present. No edema present. No thyroid mass and no thyromegaly present.  Cardiovascular: S1 normal and S2 normal. Exam reveals no gallop.  No murmur heard. Pulses:      Dorsalis pedis pulses are 2+ on the right side, and 2+ on the left side.  Respiratory: No respiratory distress. She has no wheezes. She has no rhonchi. She has no rales.  GI: Soft. Bowel sounds are normal. There is no tenderness.  Musculoskeletal:        Right ankle: She exhibits swelling.       Left ankle: She exhibits swelling.  Lymphadenopathy:    She has no cervical adenopathy.  Neurological: She is alert. No cranial nerve deficit.  Skin: Skin is warm. Nails show no clubbing.  Large area of black discoloration right heel and up into the Achilles insertion.  Psychiatric: She has a normal mood and affect.      Data Reviewed: Basic Metabolic Panel: Recent Labs  Lab 09/06/2017 1731 09/02/17 0309  NA 140 140  K 5.3* 4.2  CL 99* 104  CO2 27 28  GLUCOSE 153* 102*  BUN 35* 32*  CREATININE 1.48* 1.14*  CALCIUM 10.2 9.5   Liver Function Tests: Recent Labs  Lab 09/05/2017 1731  AST 21  ALT <5*  ALKPHOS 132*  BILITOT 0.1*  PROT 7.8  ALBUMIN 3.0*   CBC: Recent Labs  Lab 09/05/2017 1731 09/02/17 0309  WBC 18.5* 13.7*  NEUTROABS 16.4*  --   HGB 12.3 10.8*  HCT 37.7 32.6*  MCV 89.5 88.0  PLT 565* 493*    CBG: Recent Labs  Lab 09/10/2017 2125 09/02/17 0747  GLUCAP 152* 78    Recent Results (from the past 240 hour(s))  Blood Culture (routine x 2)     Status: None (Preliminary result)   Collection Time: 09/24/2017  5:37 PM  Result Value Ref Range Status   Specimen Description LEFT ANTECUBITAL  Final   Special Requests   Final    BOTTLES DRAWN AEROBIC AND ANAEROBIC Blood Culture adequate volume   Culture NO GROWTH < 12 HOURS  Final   Report Status PENDING  Incomplete  Blood Culture (routine x 2)     Status: None (Preliminary result)   Collection Time: 08/31/2017  6:31 PM  Result Value Ref Range Status   Specimen Description BLOOD LEFT ARM  Final   Special Requests   Final    BOTTLES DRAWN AEROBIC AND ANAEROBIC Blood Culture adequate volume   Culture NO GROWTH < 12 HOURS  Final   Report Status PENDING  Incomplete  MRSA PCR Screening     Status: Abnormal   Collection Time: 09/21/2017 10:10 PM  Result Value Ref Range Status   MRSA by PCR POSITIVE (A) NEGATIVE Final    Comment:        The GeneXpert MRSA Assay  (FDA approved for NASAL specimens only), is one component of a comprehensive MRSA colonization surveillance program. It is not intended to diagnose MRSA infection nor to guide or monitor treatment for MRSA infections. RESULT CALLED TO, READ BACK BY AND VERIFIED WITH: [facility]MARY Taylor AT 0014 09/02/17.PMH      Studies: Dg Foot 2 Views Right  Result Date: 08/29/2017 CLINICAL DATA:  Soft tissue wound of the heel. EXAM: RIGHT FOOT - 2 VIEW COMPARISON:  06/06/2011 foot and 05/10/2016 ankle radiographs. FINDINGS: Soft tissue ulceration posterior to the calcaneus with soft tissue ossifications consistent with bony sequestra and new bone destruction involving the posterior calcaneal enthesophyte since prior study. Intact plantar calcaneal enthesopathy. No acute fracture of the foot. IMPRESSION: Findings in keeping with osteomyelitis of the posterior calcaneus with overlying soft tissue ulceration and adjacent bony sequestra. Findings of both acute and chronic components of osteomyelitis. Electronically Signed   By: Tollie Ethavid  Kwon M.D.   On: 08/28/2017 16:51    Scheduled Meds: . ARIPiprazole  2.5 mg Oral BID  . aspirin EC  81 mg Oral Daily  . atorvastatin  20 mg Oral Daily  . carbidopa-levodopa  1 tablet Oral BID  . Chlorhexidine Gluconate Cloth  6 each Topical Q0600  . docusate sodium  100 mg Oral BID  . DULoxetine  60 mg Oral Daily  . feeding supplement (ENSURE ENLIVE)  237 mL Oral BID BM  . folic acid  1 mg Oral Once per day on Sun Mon Tue Wed Thu Sat  . furosemide  40 mg Oral Daily  . gabapentin  200 mg Oral QHS  . heparin  5,000 Units Subcutaneous Q8H  . insulin aspart  0-9 Units Subcutaneous TID WC  . insulin glargine  10 Units Subcutaneous Daily  . lamoTRIgine  100 mg Oral BID  . methotrexate  15 mg Oral Q Fri  . mupirocin ointment  1 application Nasal BID  . pantoprazole  40 mg Oral Daily  . polyethylene glycol  17 g Oral BID  . polyvinyl alcohol  1 drop Both Eyes TID  . vitamin C   250 mg Oral BID   Continuous Infusions: . clindamycin (CLEOCIN) IV Stopped (09/02/17 16100623)  . [START ON 09/03/2017] levofloxacin (LEVAQUIN) IV      Assessment/Plan:  1. Acute on chronic osteomyelitis.  The patient was given vancomycin and Zosyn but developed a redness throughout the body.  Sometimes people can develop a red man syndrome with infusion of vancomycin so not real sure if this was an allergy or not.  Antibiotics were then switched to clindamycin  and Levaquin.  Podiatry consultation.  N.p.o. for right now until podiatry sees the patient.  No contraindications to surgery at this time. 2. Type 2 diabetes mellitus.  I decreased the dose of Lantus since the patient is n.p.o. 3. Diabetic neuropathy on gabapentin 4. Relative hypotension.  Hold blood pressure medications 5. Hyperlipidemia unspecified on statin 6. Parkinson's on Sinemet 7. Bipolar disorder continue psychiatric medication  Code Status:     Code Status Orders  (From admission, onward)        Start     Ordered   09/09/2017 1948  Full code  Continuous     09/11/2017 1947    Code Status History    Date Active Date Inactive Code Status Order ID Comments User Context   06/17/2017 15:52 06/24/2017 19:37 Full Code [id number]  Marguarite ArbourSparks, Jeffrey D, MD Inpatient   10/05/2016 12:17 10/09/2016 18:37 Full Code [id number]  Gwenyth BenderBlack, Karen M, NP ED   08/24/2016 20:19 08/27/2016 19:12 Full Code [id number]  Narda BondsNettey, Ralph A, MD Inpatient    Advance Directive Documentation     Most Recent Value  Type of Advance Directive  Out of facility DNR (pink MOST or yellow form)  Pre-existing out of facility DNR order (yellow form or pink MOST form)  Pink MOST form placed in chart (order not valid for inpatient use)  "MOST" Form in Place?  No data     Family Communication: Husband at the bedside Disposition Plan: Likely back to facility next week  Consultants:  Podiatry  Antibiotics:  Clindamycin  Levaquin  Time spent: 28 minutes  Faelyn Sigler  [employer]  [employer]

## 2017-09-02 NOTE — Consult Note (Signed)
ORTHOPAEDIC CONSULTATION  REQUESTING PHYSICIAN: Alford Highland, MD  Chief Complaint: Right heel ulceration  HPI: Maureen Taylor is a 82 y.o. female who complains of right heel ulceration.  Has been at a local nursing facility.  Was evaluated by a nurse from her insurance company and there was a concern for severe infection.  Brought to the ER.  The patient.  Patient did not provide history but friend of the family was present to provide history.  Also spoke to the daughter [name] by telephone.  Daughter states that prior to previous hospitalization she did ambulate some 6 months ago.  At her most recent hospitalization she developed the wound on the back of the heel and she has been off of this ever since.  She is complaining of some pain to the area.  Past Medical History:  Diagnosis Date  . Acute encephalopathy   . Allergic rhinitis   . Ascorbic acid deficiency   . Bipolar affective disorder (HCC)   . Candidiasis   . Charcot's joint   . Chronic constipation   . Dysuria   . GERD (gastroesophageal reflux disease)   . History of falling   . HLD (hyperlipidemia)   . HTN (hypertension)   . Magnesium deficiency   . Major depressive disorder   . Mixed incontinence   . Morbid obesity (HCC)   . Muscle weakness   . Neuropathic pain   . Neuropathy   . OAB (overactive bladder)   . Obesity   . Oral pain   . Parkinson disease (HCC)   . Pneumonia   . Protein calorie malnutrition (HCC)   . Rosacea   . Sepsis (HCC) 09/2016  . Slow transit constipation   . Type 2 diabetes mellitus with diabetic polyneuropathy, with long-term current use of insulin (HCC)   . Urinary tract infection without hematuria   . Vaginal atrophy   . Vitamin deficiency    Past Surgical History:  Procedure Laterality Date  . ABDOMINAL HYSTERECTOMY    . CERVICAL CONIZATION W/BX    . CHOLECYSTECTOMY    . REVISION TOTAL KNEE ARTHROPLASTY Right    Social History   Socioeconomic History  . Marital status:  Widowed    Spouse name: None  . Number of children: None  . Years of education: None  . Highest education level: None  Social Needs  . Financial resource strain: None  . Food insecurity - worry: None  . Food insecurity - inability: None  . Transportation needs - medical: None  . Transportation needs - non-medical: None  Occupational History  . None  Tobacco Use  . Smoking status: Never Smoker  . Smokeless tobacco: Never Used  Substance and Sexual Activity  . Alcohol use: No    Alcohol/week: 0.0 oz  . Drug use: No  . Sexual activity: No  Other Topics Concern  . None  Social History Narrative  . None   Family History  Problem Relation Age of Onset  . Colon cancer Father   . Colon cancer Sister   . Stomach cancer Sister   . Rectal cancer Sister   . Kidney disease Neg Hx   . Bladder Cancer Neg Hx   . Prostate cancer Neg Hx    Allergies  Allergen Reactions  . Codeine Itching  . Morphine And Related Itching  . Indocin [Indomethacin] Rash  . Vancomycin Rash    Rash  . Zosyn [Piperacillin Sod-Tazobactam So] Rash   Prior to Admission medications   Medication Sig  Start Date End Date Taking? Authorizing Provider  acetaminophen (TYLENOL) 500 MG tablet Take 1,000 mg by mouth every 8 (eight) hours as needed.   Yes [provider]  ARIPiprazole (ABILIFY) 5 MG tablet Take 2.5 mg by mouth 2 (two) times daily.    Yes [provider]  ascorbic acid (VITAMIN C) 250 MG tablet Take 250 mg by mouth 2 (two) times daily.   Yes [provider]  aspirin 81 MG EC tablet Take 81 mg by mouth daily. Swallow whole.   Yes [provider]  atorvastatin (LIPITOR) 20 MG tablet Take 20 mg by mouth daily.    Yes [provider]  carbidopa-levodopa (SINEMET IR) 25-100 MG tablet Take 1 tablet by mouth 2 (two) times daily.    Yes [provider]  carboxymethylcellulose 1 % ophthalmic solution Apply 1 drop to eye 3 (three) times daily.    Yes [provider]  cefTRIAXone (ROCEPHIN) IVPB Inject 1 g into the vein daily. 08/30/17 08-05-17 Yes [provider]  clindamycin (CLEOCIN) 300 MG capsule Take 300 mg by mouth every 6 (six) hours. 08/29/17 08-05-17 Yes [provider]  Cranberry 425 MG CAPS Take 1 capsule by mouth daily.    Yes [provider]  docusate sodium (COLACE) 100 MG capsule Take 1 capsule (100 mg total) by mouth 2 (two) times daily. 06/24/17  Yes Altamese DillingVachhani, Vaibhavkumar, MD  DULoxetine (CYMBALTA) 60 MG capsule Take 60 mg by mouth daily.   Yes [provider]  folic acid (FOLVITE) 1 MG tablet Take 1 mg by mouth. Saturday through Thursday   Yes [provider]  furosemide (LASIX) 40 MG tablet Take 1 tablet (40 mg total) by mouth daily. 07/12/17 07/12/18 Yes Rockne MenghiniNorman, Anne-Caroline, MD  gabapentin (NEURONTIN) 100 MG capsule Take 200 mg by mouth at bedtime.    Yes [provider]  insulin glargine (LANTUS) 100 UNIT/ML injection Inject 0.35 mLs (35 Units total) into the skin 2 (two) times daily. Patient taking differently: Inject 25 Units into the skin daily.  06/24/17  Yes Altamese DillingVachhani, Vaibhavkumar, MD  insulin lispro (HUMALOG) 100 UNIT/ML injection Inject 2-12 Units into the skin See admin instructions. Sliding Scale   Yes [provider]  lamoTRIgine (LAMICTAL) 100 MG tablet Take 100 mg by mouth 2 (two) times daily.    Yes [provider]  linaclotide (LINZESS) 145 MCG CAPS capsule Take 145 mcg by mouth daily before breakfast.   Yes [provider]  lisinopril (PRINIVIL,ZESTRIL) 20 MG tablet Take 20 mg by mouth daily.    Yes [provider]  Magnesium 400 MG CAPS Take 1 capsule by mouth daily.   Yes [provider]  methotrexate (RHEUMATREX) 2.5 MG tablet Take 15 mg by mouth every Friday.   Yes [provider]  metoprolol tartrate (LOPRESSOR) 25 MG tablet Take 25 mg by mouth 2 (two) times daily. Take 25 mg by mouth twice a day for  HTN   Yes [provider]  multivitamin-lutein (OCUVITE-LUTEIN) CAPS capsule Take 1 capsule by mouth daily.   Yes [provider]  pantoprazole (PROTONIX) 40 MG tablet Take 40 mg by mouth daily.    Yes [provider]  polyethylene glycol (MIRALAX / GLYCOLAX) packet Take 17 g by mouth 2 (two) times daily.   Yes [provider]  sennosides-docusate sodium (SENOKOT-S) 8.6-50 MG tablet Take 2 tablets by mouth 2 (two) times daily.   Yes [provider]  alum & mag hydroxide-simeth (MAALOX/MYLANTA) 200-200-20  MG/5ML suspension Take 30 mLs by mouth every 6 (six) hours as needed for indigestion or heartburn. 10/09/16   Hongalgi, Maximino GreenlandAnand D, MD  bisacodyl (DULCOLAX) 10 MG suppository Place 1 suppository (10 mg total) rectally daily as needed for moderate constipation. 06/24/17   Altamese DillingVachhani, Vaibhavkumar, MD  conjugated estrogens (PREMARIN) vaginal cream Place 1 Applicatorful vaginally every Monday, Wednesday, and Friday. Apply 0.5mg  (pea-sized amount)  just inside the vaginal introitus with a finger-tip every Monday, Wednesday and Friday nights. 10/10/16   Hongalgi, Maximino GreenlandAnand D, MD  feeding supplement, ENSURE ENLIVE, (ENSURE ENLIVE) LIQD Take 237 mLs by mouth 2 (two) times daily between meals. 06/24/17   Altamese DillingVachhani, Vaibhavkumar, MD  ipratropium-albuterol (DUONEB) 0.5-2.5 (3) MG/3ML SOLN Take 3 mLs by nebulization every 4 (four) hours as needed (wheezing). 06/24/17   Altamese DillingVachhani, Vaibhavkumar, MD  predniSONE (STERAPRED UNI-PAK 21 TAB) 10 MG (21) TBPK tablet Take 6 tabs first day, 5 tab on day 2, then 4 on day 3rd, 3 tabs on day 4th , 2 tab on day 5th, and 1 tab on 6th day. Patient not taking: Reported on 09/20/2017 06/24/17   Altamese DillingVachhani, Vaibhavkumar, MD   Dg Foot 2 Views Right  Result Date: 09/12/2017 CLINICAL DATA:  Soft tissue wound of the heel. EXAM: RIGHT FOOT - 2 VIEW COMPARISON:  06/06/2011 foot and 05/10/2016 ankle radiographs. FINDINGS: Soft tissue ulceration posterior to the  calcaneus with soft tissue ossifications consistent with bony sequestra and new bone destruction involving the posterior calcaneal enthesophyte since prior study. Intact plantar calcaneal enthesopathy. No acute fracture of the foot. IMPRESSION: Findings in keeping with osteomyelitis of the posterior calcaneus with overlying soft tissue ulceration and adjacent bony sequestra. Findings of both acute and chronic components of osteomyelitis. Electronically Signed   By: Tollie Ethavid  Kwon M.D.   On: 12-16-2016 16:51    Positive ROS: All other systems have been reviewed and were otherwise negative with the exception of those mentioned in the HPI and as above.  12 point ROS was performed.  Physical Exam: General: Alert and oriented.  No apparent distress.  Vascular:  Left foot:Dorsalis Pedis:  absent Posterior Tibial:  absent  Right foot: Dorsalis Pedis:  absent Posterior Tibial:  absent  Neuro:absent protective sensation.  She did have some gross sensation with palpation.  Derm: On the posterior aspect of the left heel is a small superficial pre-ulcerative area.  No signs of breakdown of the skin at this point.  No signs of infection.  Otherwise no ulceration on the left side.  On the right heel there is a very large decubitus ulceration with necrotic tissue measuring approximately 4-5 cm in diameter.  Very firm hard tissue with some mild loosen tissue at the more proximal aspect.  No purulent drainage at this time.  Surrounding erythema is noted.  Ortho/MS: Minimal edema to the lower extremities.  She does have some range of motion to the lower extremities but she has not been ambulating of recent.  Assessment: 1.  Diabetes with neuropathy with posterior heel decubitus ulcer  2.  Peripheral vascular disease  3.  Osteomyelitis right calcaneus  Plan: I would like to obtain an MRI to further evaluate the posterior aspect of the heel and infection.  X-rays were concerning for osteomyelitis.  The  ulceration is fairly large with a large decubitus ulcer with necrotic tissue.  I did discuss briefly with the daughter via telephone this would be a very difficult wound to heal in normal circumstances.  In a diabetic patient with neuropathy  with poor circulation this will be an extremely difficult wound to heal.  She will need vascular evaluation as well.  If limb salvage is requested then she would need debridement of the large necrotic tissue and posterior aspect of the heel.  If MRI shows extensive osteomyelitis most likely her best option would be below the knee amputation.  Applied Allevyn heel pads to bilateral heels.  We will also obtain pressure reducing boots for now.  No debridement needed as she will need at least vascular evaluation prior to any attempts at limb salvage.  Will consult vascular at this time.    Irean Hong, DPM Cell [phone]   09/02/2017 2:42 PM

## 2017-09-02 NOTE — Consult Note (Signed)
[facility] Vascular Consult Note  MRN : [id number]  Maureen MustardJessie B Taylor is a 82 y.o. ([birth year]) female who presents with chief complaint of  Chief Complaint  Patient presents with  . Wound Check   History of Present Illness:  The patient is a 82 year old female with a past medical history of hypertension, diabetes, hyperlipidemia, Parkinson's, bipolar depression, chronic foley who presented to the [facility] ED from her nursing home facility due to a worsening right heel ulceration.  The patient was seen and examined with a family friend at her bedside.  Vascular surgery consultation requested by Dr. Ether GriffinsFowler for further recommendations for possible angiogram / amputation.    Patient is a poor historian and most of the history is obtained from her family friend at the bedside and previous notes.  The patient's most recent nursing home note from August 03, 2017 states she has been treated for a "right heel pressure ulcer with necrotic tissue" using "Santyl and elevation".  Apparently she has been followed by her nursing home wound team.  The patient has been treated with doxycycline, Rocephin and clindamycin along with local wound care with minimal improvement and was sent to the emergency department for further assessment.  Right foot x-ray was notable for "findings in keeping with osteomyelitis of the posterior calcaneus with overlying soft tissue ulceration and adjacent bony sequestra. Findings of both acute and chronic components of osteomyelitis."  As per the patient's inpatient hospital notes, the patient's daughter states she has not really ambulated for proximately 6 months.  The patient is able to provide minimal history.  She is resting comfortably in bed.  Family friend is at her bedside.  Does state discomfort to her bilateral lower extremity, right more than left.  Unable to obtain any more information about her symptoms.  Current  Facility-Administered Medications  Medication Dose Route Frequency Provider Last Rate Last Dose  . acetaminophen (TYLENOL) tablet 1,000 mg  1,000 mg Oral Q8H PRN Altamese DillingVachhani, Vaibhavkumar, MD   1,000 mg at 09/02/17 1446  . alum & mag hydroxide-simeth (MAALOX/MYLANTA) 200-200-20 MG/5ML suspension 30 mL  30 mL Oral Q6H PRN Altamese DillingVachhani, Vaibhavkumar, MD      . ARIPiprazole (ABILIFY) tablet 2.5 mg  2.5 mg Oral BID Altamese DillingVachhani, Vaibhavkumar, MD   2.5 mg at 09/12/2017 2155  . aspirin EC tablet 81 mg  81 mg Oral Daily Altamese DillingVachhani, Vaibhavkumar, MD      . atorvastatin (LIPITOR) tablet 20 mg  20 mg Oral Daily Altamese DillingVachhani, Vaibhavkumar, MD      . bisacodyl (DULCOLAX) suppository 10 mg  10 mg Rectal Daily PRN Altamese DillingVachhani, Vaibhavkumar, MD      . carbidopa-levodopa (SINEMET IR) 25-100 MG per tablet immediate release 1 tablet  1 tablet Oral BID Altamese DillingVachhani, Vaibhavkumar, MD   1 tablet at 09/19/2017 2155  . Chlorhexidine Gluconate Cloth 2 % PADS 6 each  6 each Topical Q0600 Altamese DillingVachhani, Vaibhavkumar, MD   6 each at 09/02/17 0910  . clindamycin (CLEOCIN) IVPB 600 mg  600 mg Intravenous Q8H Salary, Evelena AsaMontell D, MD   Stopped at 09/02/17 1510  . diphenhydrAMINE (BENADRYL) capsule 25 mg  25 mg Oral Q6H PRN Angelina OkSalary, Montell D, MD   25 mg at 08/29/2017 2222  . docusate sodium (COLACE) capsule 100 mg  100 mg Oral BID Altamese DillingVachhani, Vaibhavkumar, MD   100 mg at 09/19/2017 2155  . docusate sodium (COLACE) capsule 100 mg  100 mg Oral BID PRN Altamese DillingVachhani, Vaibhavkumar, MD      .  DULoxetine (CYMBALTA) DR capsule 60 mg  60 mg Oral Daily Altamese Dilling, MD      . feeding supplement (ENSURE ENLIVE) (ENSURE ENLIVE) liquid 237 mL  237 mL Oral BID BM Altamese Dilling, MD      . folic acid (FOLVITE) tablet 1 mg  1 mg Oral Once per day on Sun Mon Tue Wed Thu Sat Altamese Dilling, MD      . furosemide (LASIX) tablet 40 mg  40 mg Oral Daily Altamese Dilling, MD      . gabapentin (NEURONTIN) capsule 200 mg  200 mg Oral QHS Altamese Dilling, MD    200 mg at 09/16/2017 2155  . heparin injection 5,000 Units  5,000 Units Subcutaneous Q8H Altamese Dilling, MD   5,000 Units at 09/02/17 1420  . insulin aspart (novoLOG) injection 0-9 Units  0-9 Units Subcutaneous TID WC Altamese Dilling, MD      . insulin glargine (LANTUS) injection 10 Units  10 Units Subcutaneous Daily Wieting, Richard, MD      . ipratropium-albuterol (DUONEB) 0.5-2.5 (3) MG/3ML nebulizer solution 3 mL  3 mL Nebulization Q4H PRN Altamese Dilling, MD      . lamoTRIgine (LAMICTAL) tablet 100 mg  100 mg Oral BID Altamese Dilling, MD   100 mg at 09/03/2017 2155  . [START ON 09/03/2017] Levofloxacin (LEVAQUIN) IVPB 250 mg  250 mg Intravenous Q24H Salary, Montell D, MD      . methotrexate (RHEUMATREX) tablet 15 mg  15 mg Oral Q Sheppard Evens, Heath Gold, MD   15 mg at 09/06/2017 2200  . mupirocin ointment (BACTROBAN) 2 % 1 application  1 application Nasal BID Altamese Dilling, MD   1 application at 09/02/17 [phone]  . pantoprazole (PROTONIX) EC tablet 40 mg  40 mg Oral Daily Altamese Dilling, MD      . polyethylene glycol (MIRALAX / GLYCOLAX) packet 17 g  17 g Oral BID Altamese Dilling, MD   17 g at 09/02/2017 2158  . polyvinyl alcohol (LIQUIFILM TEARS) 1.4 % ophthalmic solution 1 drop  1 drop Both Eyes TID Altamese Dilling, MD   1 drop at 09/02/17 0907  . vitamin C (ASCORBIC ACID) tablet 250 mg  250 mg Oral BID Altamese Dilling, MD   250 mg at 09/18/2017 2206   Past Medical History:  Diagnosis Date  . Acute encephalopathy   . Allergic rhinitis   . Ascorbic acid deficiency   . Bipolar affective disorder (HCC)   . Candidiasis   . Charcot's joint   . Chronic constipation   . Dysuria   . GERD (gastroesophageal reflux disease)   . History of falling   . HLD (hyperlipidemia)   . HTN (hypertension)   . Magnesium deficiency   . Major depressive disorder   . Mixed incontinence   . Morbid obesity (HCC)   . Muscle weakness   . Neuropathic  pain   . Neuropathy   . OAB (overactive bladder)   . Obesity   . Oral pain   . Parkinson disease (HCC)   . Pneumonia   . Protein calorie malnutrition (HCC)   . Rosacea   . Sepsis (HCC) 09/2016  . Slow transit constipation   . Type 2 diabetes mellitus with diabetic polyneuropathy, with long-term current use of insulin (HCC)   . Urinary tract infection without hematuria   . Vaginal atrophy   . Vitamin deficiency    Past Surgical History:  Procedure Laterality Date  . ABDOMINAL HYSTERECTOMY    . CERVICAL CONIZATION W/BX    .  CHOLECYSTECTOMY    . REVISION TOTAL KNEE ARTHROPLASTY Right    Social History Social History   Tobacco Use  . Smoking status: Never Smoker  . Smokeless tobacco: Never Used  Substance Use Topics  . Alcohol use: No    Alcohol/week: 0.0 oz  . Drug use: No   Family History Family History  Problem Relation Age of Onset  . Colon cancer Father   . Colon cancer Sister   . Stomach cancer Sister   . Rectal cancer Sister   . Kidney disease Neg Hx   . Bladder Cancer Neg Hx   . Prostate cancer Neg Hx   The patient is unable to provide a family history however does not seem as per hospital notes there is any family history of peripheral artery disease, renal disease or bleeding /clotting disorders.  Allergies  Allergen Reactions  . Codeine Itching  . Morphine And Related Itching  . Indocin [Indomethacin] Rash  . Vancomycin Rash    Rash  . Zosyn [Piperacillin Sod-Tazobactam So] Rash   REVIEW OF SYSTEMS (Negative unless checked)  Constitutional: [] Weight loss  [] Fever  [] Chills Cardiac: [] Chest pain   [] Chest pressure   [] Palpitations   [] Shortness of breath when laying flat   [] Shortness of breath at rest   [] Shortness of breath with exertion. Vascular:  [] Pain in legs with walking   [x] Pain in legs at rest   [x] Pain in legs when laying flat   [] Claudication   [] Pain in feet when walking  [] Pain in feet at rest  [] Pain in feet when laying flat   [] History  of DVT   [] Phlebitis   [x] Swelling in legs   [] Varicose veins   [x] Non-healing ulcers Pulmonary:   [] Uses home oxygen   [] Productive cough   [] Hemoptysis   [] Wheeze  [] COPD   [] Asthma Neurologic:  [] Dizziness  [] Blackouts   [] Seizures   [] History of stroke   [] History of TIA  [] Aphasia   [] Temporary blindness   [] Dysphagia   [] Weakness or numbness in arms   [] Weakness or numbness in legs Musculoskeletal:  [] Arthritis   [] Joint swelling   [] Joint pain   [] Low back pain Hematologic:  [] Easy bruising  [] Easy bleeding   [] Hypercoagulable state   [] Anemic  [] Hepatitis Gastrointestinal:  [] Blood in stool   [] Vomiting blood  [] Gastroesophageal reflux/heartburn   [] Difficulty swallowing. Genitourinary:  [] Chronic kidney disease   [] Difficult urination  [] Frequent urination  [] Burning with urination   [] Blood in urine Skin:  [] Rashes   [x] Ulcers   [x] Wounds Psychological:  [] History of anxiety   [x]  History of major depression.  Physical Examination  Vitals:   02-Sep-2017 1954 09/02/17 0510 09/02/17 0720 09/02/17 1457  BP: 124/63 (!) 109/51 (!) 100/56 115/71  Pulse: 91  95 98  Resp: 18 16 18 16   Temp: 97.7 F (36.5 C) 98.5 F (36.9 C) 98.2 F (36.8 C) 98 F (36.7 C)  TempSrc: Oral Axillary Oral Oral  SpO2: 100% 98% 97% 100%  Weight: 194 lb 6.4 oz (88.2 kg)      Body mass index is 32.35 kg/m.  Gen:  WD/WN, NAD.  The patient is resting comfortably in bed. Head: [place]/AT, No temporalis wasting. Prominent temp pulse not noted. Ear/Nose/Throat: Hearing grossly intact, nares w/o erythema or drainage, oropharynx w/o Erythema/Exudate Eyes: Sclera non-icteric, conjunctiva clear Neck: Trachea midline.  No JVD.  Pulmonary:  Good air movement, respirations not labored, equal bilaterally.  Cardiac: RRR, normal S1, S2. Vascular:  Vessel Right Left  Radial Palpable  Palpable  Ulnar Palpable Palpable  Brachial Palpable Palpable  Carotid Palpable, without bruit Palpable, without bruit  Aorta Not palpable N/A   Femoral Palpable Palpable  Popliteal Non-palpable Non-palpable  PT Non-palpable Non-palpable  DP Non-palpable Non-palpable   Gastrointestinal: soft, non-tender/non-distended. No guarding/reflex.  Musculoskeletal: M/S 5/5 throughout.  There is mild to moderate slightly pitting edema noted bilaterally Neurologic: Sensation grossly intact in extremities.  Symmetrical.   Psychiatric: Flat affect Dermatologic:   Right lower extremity: There is a large ulceration to the right heel with necrotic tissue noted measuring approximately 5cm.  Minimal drainage. The patient's lower extremity is erythematous. Lymph : No Cervical, Axillary, or Inguinal lymphadenopathy.  CBC Lab Results  Component Value Date   WBC 13.7 (H) 09/02/2017   HGB 10.8 (L) 09/02/2017   HCT 32.6 (L) 09/02/2017   MCV 88.0 09/02/2017   PLT 493 (H) 09/02/2017   BMET    Component Value Date/Time   NA 140 09/02/2017 0309   NA 137 01/10/2017   NA 142 05/31/2014 0455   K 4.2 09/02/2017 0309   K 3.7 05/31/2014 0455   CL 104 09/02/2017 0309   CL 107 05/31/2014 0455   CO2 28 09/02/2017 0309   CO2 27 05/31/2014 0455   GLUCOSE 102 (H) 09/02/2017 0309   GLUCOSE 234 (H) 05/31/2014 0455   BUN 32 (H) 09/02/2017 0309   BUN 19 01/10/2017   BUN 12 05/31/2014 0455   CREATININE 1.14 (H) 09/02/2017 0309   CREATININE 1.09 05/31/2014 0455   CALCIUM 9.5 09/02/2017 0309   CALCIUM 8.4 (L) 05/31/2014 0455   GFRNONAA 44 (L) 09/02/2017 0309   GFRNONAA 49 (L) 05/31/2014 0455   GFRAA 51 (L) 09/02/2017 0309   GFRAA 57 (L) 05/31/2014 0455   Estimated Creatinine Clearance: 43.2 mL/min (A) (by C-G formula based on SCr of 1.14 mg/dL (H)).  COAG Lab Results  Component Value Date   INR 1.21 10/05/2016   INR 1.10 08/24/2016   INR 1.1 03/22/2013   Radiology Dg Foot 2 Views Right  Result Date: September 13, 2017 CLINICAL DATA:  Soft tissue wound of the heel. EXAM: RIGHT FOOT - 2 VIEW COMPARISON:  06/06/2011 foot and 05/10/2016 ankle radiographs.  FINDINGS: Soft tissue ulceration posterior to the calcaneus with soft tissue ossifications consistent with bony sequestra and new bone destruction involving the posterior calcaneal enthesophyte since prior study. Intact plantar calcaneal enthesopathy. No acute fracture of the foot. IMPRESSION: Findings in keeping with osteomyelitis of the posterior calcaneus with overlying soft tissue ulceration and adjacent bony sequestra. Findings of both acute and chronic components of osteomyelitis. Electronically Signed   By: Tollie Eth M.D.   On: 09/13/17 16:51   Assessment/Plan The patient is an 82 year old female with multiple medical issues including hypertension diabetes hyperlipidemia Parkinson's bipolar depression who presented to the [facility] emergency department with a worsening right heel ulceration.  The patient presented from her nursing home facility after being treated with different antibiotics and local wound care.  Acute on chronic osteomyelitis on the right foot x-ray - Stable 1. PAD: Patient with multiple risk factors for peripheral artery disease including hypertension, diabetes and hyperlipidemia.  The patient has nonpalpable bilateral pedal pulses in the setting of a nonhealing right heel ulceration with necrotic tissue and osteomyelitis on x-ray.  MRI is ordered.  The patient would benefit from undergoing a right lower extremity angiogram with possible intervention to assess the patient's anatomy and contributing peripheral artery disease.  If appropriate, endovascular revascularization of the leg  with take place at that time.  Procedure, risks and benefits explained to the patient and her family friend who was sitting at her bedside.  All questions were answered.  As of now it seems that the patient is willing to proceed.  We will plan on doing this on Monday with Dr. Wyn Quakerew 2. Diabetes: Encouraged good control as its slows the progression of atherosclerotic disease and  directly effects / impedes wound healing. 3. Hyperlipidemia: Encouraged good control as its slows the progression of atherosclerotic disease 4. Hypertension: Encouraged good control as its slows the progression of atherosclerotic disease  Discussed with Dr. Weldon Inchesew  Treson Laura A Sherlyn Ebbert, PA-C  09/02/2017 3:25 PM  This note was created with Dragon medical transcription system.  Any error is purely unintentional.

## 2017-09-02 NOTE — Clinical Social Work Note (Signed)
CSW has deactivated the Legal Guardian flag as the patient does not currently have a legal guardian.  Maureen PonderKaren Martha Cardelia Sassano, MSW, Theresia MajorsLCSWA [phone]

## 2017-09-02 NOTE — Progress Notes (Signed)
Pt is alert but confused. No complaints of pain to right heal. Heals elevated up off the bed. Pt pulled out iv, new iv started and infusing without difficulty. After receiving vancomycin and Zosyn pt became very red all over body. Dr. Katheren ShamsSalary notified and new orders obtained. Pt able to sleep in between care.

## 2017-09-03 ENCOUNTER — Inpatient Hospital Stay: Payer: Medicare Other

## 2017-09-03 LAB — BASIC METABOLIC PANEL
ANION GAP: 12 (ref 5–15)
BUN: 28 mg/dL — ABNORMAL HIGH (ref 6–20)
CALCIUM: 10 mg/dL (ref 8.9–10.3)
CO2: 27 mmol/L (ref 22–32)
Chloride: 103 mmol/L (ref 101–111)
Creatinine, Ser: 1.02 mg/dL — ABNORMAL HIGH (ref 0.44–1.00)
GFR, EST AFRICAN AMERICAN: 59 mL/min — AB (ref >60)
GFR, EST NON AFRICAN AMERICAN: 51 mL/min — AB (ref >60)
Glucose, Bld: 140 mg/dL — ABNORMAL HIGH (ref 65–99)
Potassium: 4.8 mmol/L (ref 3.5–5.1)
Sodium: 142 mmol/L (ref 135–145)

## 2017-09-03 LAB — CBC
HEMATOCRIT: 33.7 % — AB (ref 35.0–47.0)
Hemoglobin: 11.1 g/dL — ABNORMAL LOW (ref 12.0–16.0)
MCH: 29.4 pg (ref 26.0–34.0)
MCHC: 32.9 g/dL (ref 32.0–36.0)
MCV: 89.1 fL (ref 80.0–100.0)
PLATELETS: 491 10*3/uL — AB (ref 150–440)
RBC: 3.78 MIL/uL — AB (ref 3.80–5.20)
RDW: 15.9 % — AB (ref 11.5–14.5)
WBC: 12.2 10*3/uL — ABNORMAL HIGH (ref 3.6–11.0)

## 2017-09-03 LAB — GLUCOSE, CAPILLARY
GLUCOSE-CAPILLARY: 147 mg/dL — AB (ref 65–99)
GLUCOSE-CAPILLARY: 110 mg/dL — AB (ref 65–99)
Glucose-Capillary: 113 mg/dL — ABNORMAL HIGH (ref 65–99)
Glucose-Capillary: 116 mg/dL — ABNORMAL HIGH (ref 65–99)
Glucose-Capillary: 137 mg/dL — ABNORMAL HIGH (ref 65–99)

## 2017-09-03 MED ORDER — IPRATROPIUM-ALBUTEROL 0.5-2.5 (3) MG/3ML IN SOLN
3.0000 mL | Freq: Four times a day (QID) | RESPIRATORY_TRACT | Status: DC
  Administered 2017-09-03 (×2): 3 mL via RESPIRATORY_TRACT
  Filled 2017-09-03 (×2): qty 3

## 2017-09-03 MED ORDER — BUDESONIDE 0.5 MG/2ML IN SUSP
0.5000 mg | Freq: Two times a day (BID) | RESPIRATORY_TRACT | Status: DC
  Administered 2017-09-03 – 2017-09-09 (×9): 0.5 mg via RESPIRATORY_TRACT
  Filled 2017-09-03 (×11): qty 2

## 2017-09-03 MED ORDER — TRAMADOL HCL 50 MG PO TABS
50.0000 mg | ORAL_TABLET | Freq: Four times a day (QID) | ORAL | Status: DC | PRN
  Administered 2017-09-03 – 2017-09-07 (×2): 50 mg via ORAL
  Filled 2017-09-03 (×2): qty 1

## 2017-09-03 NOTE — Progress Notes (Signed)
Patient ID: Maureen MustardJessie B Philbrook, female   DOB: 05/14/1935, 82 y.o.   MRN: [id number]  [facility] PROGRESS NOTE  Maureen Taylor WGN:[id number] DOB: 05/14/1935 DOA: 09/22/2017 PCP: Dorothey BasemanBronstein, David, MD  HPI/Subjective: Patient complains of pain all down her right leg.  Patient with some cough.  She is concerned that she needs a surgical procedure.  Objective: Vitals:   09/03/17 0816 09/03/17 1300  BP: 106/70   Pulse: (!) 109   Resp: 18   Temp: 98.1 F (36.7 C)   SpO2: 95% 97%    Filed Weights   09/12/2017 1556 09/22/2017 1954  Weight: 90.5 kg (199 lb 9.6 oz) 88.2 kg (194 lb 6.4 oz)    ROS: Review of Systems  Constitutional: Negative for chills and fever.  Eyes: Negative for blurred vision.  Respiratory: Positive for cough. Negative for shortness of breath.   Cardiovascular: Negative for chest pain.  Gastrointestinal: Negative for abdominal pain, constipation, diarrhea, nausea and vomiting.  Genitourinary: Negative for dysuria.  Musculoskeletal: Positive for joint pain.  Neurological: Negative for dizziness and headaches.   Exam: Physical Exam  HENT:  Nose: No mucosal edema.  Mouth/Throat: No oropharyngeal exudate or posterior oropharyngeal edema.  Eyes: Conjunctivae, EOM and lids are normal. Pupils are equal, round, and reactive to light.  Neck: No JVD present. Carotid bruit is not present. No edema present. No thyroid mass and no thyromegaly present.  Cardiovascular: S1 normal and S2 normal. Exam reveals no gallop.  No murmur heard. Pulses:      Dorsalis pedis pulses are 2+ on the right side, and 2+ on the left side.  Respiratory: No respiratory distress. She has no wheezes. She has no rhonchi. She has no rales.  GI: Soft. Bowel sounds are normal. There is no tenderness.  Musculoskeletal:       Right ankle: She exhibits swelling.       Left ankle: She exhibits swelling.  Lymphadenopathy:    She has no cervical adenopathy.  Neurological: She is alert. No cranial  nerve deficit.  Skin: Skin is warm. Nails show no clubbing.  Large area of black discoloration right heel and up into the Achilles insertion.  Psychiatric: She has a normal mood and affect.      Data Reviewed: Basic Metabolic Panel: Recent Labs  Lab 09/16/2017 1731 09/02/17 0309 09/03/17 0321  NA 140 140 142  K 5.3* 4.2 4.8  CL 99* 104 103  CO2 27 28 27   GLUCOSE 153* 102* 140*  BUN 35* 32* 28*  CREATININE 1.48* 1.14* 1.02*  CALCIUM 10.2 9.5 10.0   Liver Function Tests: Recent Labs  Lab 09/09/2017 1731  AST 21  ALT <5*  ALKPHOS 132*  BILITOT 0.1*  PROT 7.8  ALBUMIN 3.0*   CBC: Recent Labs  Lab 09/05/2017 1731 09/02/17 0309 09/03/17 0321  WBC 18.5* 13.7* 12.2*  NEUTROABS 16.4*  --   --   HGB 12.3 10.8* 11.1*  HCT 37.7 32.6* 33.7*  MCV 89.5 88.0 89.1  PLT 565* 493* 491*    CBG: Recent Labs  Lab 09/02/17 1655 09/02/17 2136 09/03/17 0017 09/03/17 0751 09/03/17 1227  GLUCAP 92 132* 113* 137* 147*    Recent Results (from the past 240 hour(s))  Blood Culture (routine x 2)     Status: None (Preliminary result)   Collection Time: 09/14/2017  5:37 PM  Result Value Ref Range Status   Specimen Description LEFT ANTECUBITAL  Final   Special Requests   Final    BOTTLES DRAWN  AEROBIC AND ANAEROBIC Blood Culture adequate volume   Culture NO GROWTH 2 DAYS  Final   Report Status PENDING  Incomplete  Blood Culture (routine x 2)     Status: None (Preliminary result)   Collection Time: 09/09/2017  6:31 PM  Result Value Ref Range Status   Specimen Description BLOOD LEFT ARM  Final   Special Requests   Final    BOTTLES DRAWN AEROBIC AND ANAEROBIC Blood Culture adequate volume   Culture NO GROWTH 2 DAYS  Final   Report Status PENDING  Incomplete  MRSA PCR Screening     Status: Abnormal   Collection Time: 09/25/2017 10:10 PM  Result Value Ref Range Status   MRSA by PCR POSITIVE (A) NEGATIVE Final    Comment:        The GeneXpert MRSA Assay (FDA approved for NASAL  specimens only), is one component of a comprehensive MRSA colonization surveillance program. It is not intended to diagnose MRSA infection nor to guide or monitor treatment for MRSA infections. RESULT CALLED TO, READ BACK BY AND VERIFIED WITH: [facility] RAYMOND AT 0014 09/02/17.PMH      Studies: Mr Ankle Right Wo Contrast  Result Date: 09/02/2017 CLINICAL DATA:  Ulceration on the right heel for several months. EXAM: MRI OF THE RIGHT ANKLE WITHOUT CONTRAST TECHNIQUE: Multiplanar, multisequence MR imaging of the ankle was performed. No intravenous contrast was administered. COMPARISON:  Plain films of the right foot 08/31/2017. FINDINGS: The study is degraded by patient motion. TENDONS Peroneal: Intact. Posteromedial: Intact. Anterior: Intact. Achilles: Intact. Plantar Fascia: Intact. LIGAMENTS Lateral: Intact. Medial: Intact. CARTILAGE Ankle Joint: Appears normal. Subtalar Joints/Sinus Tarsi: Appear normal. Bones: There is intense marrow edema in the posterior calcaneus. Centrally and medially, edema extends approximately 2.5 cm into bone. Along the lateral periphery of the calcaneus, edema extends 3 cm anteriorly. Other: No fluid collection is identified. A deep ulceration over the heel is identified. Portions of the calcaneus appear exposed to air. IMPRESSION: Osteomyelitis in the posterior calcaneus at the site of a large skin ulceration. The Achilles tendon is intact and unremarkable. Negative for abscess or septic joint. Electronically Signed   By: Drusilla Kanner M.D.   On: 09/02/2017 16:41   Dg Foot 2 Views Right  Result Date: 09/02/2017 CLINICAL DATA:  Soft tissue wound of the heel. EXAM: RIGHT FOOT - 2 VIEW COMPARISON:  06/06/2011 foot and 05/10/2016 ankle radiographs. FINDINGS: Soft tissue ulceration posterior to the calcaneus with soft tissue ossifications consistent with bony sequestra and new bone destruction involving the posterior calcaneal enthesophyte since prior study. Intact plantar  calcaneal enthesopathy. No acute fracture of the foot. IMPRESSION: Findings in keeping with osteomyelitis of the posterior calcaneus with overlying soft tissue ulceration and adjacent bony sequestra. Findings of both acute and chronic components of osteomyelitis. Electronically Signed   By: Tollie Eth M.D.   On: 09/09/2017 16:51    Scheduled Meds: . ARIPiprazole  2.5 mg Oral BID  . aspirin EC  81 mg Oral Daily  . atorvastatin  20 mg Oral Daily  . budesonide (PULMICORT) nebulizer solution  0.5 mg Nebulization BID  . carbidopa-levodopa  1 tablet Oral BID  . Chlorhexidine Gluconate Cloth  6 each Topical Q0600  . docusate sodium  100 mg Oral BID  . DULoxetine  60 mg Oral Daily  . feeding supplement (ENSURE ENLIVE)  237 mL Oral BID BM  . folic acid  1 mg Oral Once per day on Sun Mon Tue Wed Thu Sat  .  furosemide  40 mg Oral Daily  . gabapentin  200 mg Oral QHS  . heparin  5,000 Units Subcutaneous Q8H  . insulin aspart  0-9 Units Subcutaneous TID WC  . insulin glargine  10 Units Subcutaneous Daily  . ipratropium-albuterol  3 mL Nebulization Q6H  . lamoTRIgine  100 mg Oral BID  . methotrexate  15 mg Oral Q Fri  . mupirocin ointment  1 application Nasal BID  . pantoprazole  40 mg Oral Daily  . polyethylene glycol  17 g Oral BID  . polyvinyl alcohol  1 drop Both Eyes TID  . vitamin C  250 mg Oral BID   Continuous Infusions: . [START ON Jul 24, 2017] sodium chloride    . clindamycin (CLEOCIN) IV Stopped (09/03/17 1345)  . levofloxacin (LEVAQUIN) IV      Assessment/Plan:  1. Acute on chronic osteomyelitis.  The patient was given vancomycin and Zosyn but developed a redness throughout the body.  Sometimes people can develop a red man syndrome with infusion of vancomycin so not real sure if this was an allergy or not.  Antibiotics were then switched to clindamycin and Levaquin.  Podiatry consultation appreciated.  N.p.o. after midnight for angiogram tomorrow.  Patient will need a surgical  procedure either BKA versus partial heel amputation.  Case discussed with daughter on the phone 2. Type 2 diabetes mellitus.  Hold Lantus tonight since patient will be n.p.o. after midnight 3. Diabetic neuropathy on gabapentin 4. Relative hypotension.  Hold blood pressure medications 5. Hyperlipidemia unspecified on statin 6. Parkinson's on Sinemet 7. Bipolar disorder continue psychiatric medication  Code Status:     Code Status Orders  (From admission, onward)        Start     Ordered   09/19/2017 1948  Full code  Continuous     09/19/2017 1947    Code Status History    Date Active Date Inactive Code Status Order ID Comments User Context   06/17/2017 15:52 06/24/2017 19:37 Full Code [id number]  Marguarite ArbourSparks, Jeffrey D, MD Inpatient   10/05/2016 12:17 10/09/2016 18:37 Full Code [id number]  Gwenyth BenderBlack, Karen M, NP ED   08/24/2016 20:19 08/27/2016 19:12 Full Code [id number]  Narda BondsNettey, Ralph A, MD Inpatient    Advance Directive Documentation     Most Recent Value  Type of Advance Directive  Out of facility DNR (pink MOST or yellow form)  Pre-existing out of facility DNR order (yellow form or pink MOST form)  Pink MOST form placed in chart (order not valid for inpatient use)  "MOST" Form in Place?  No data     Family Communication: Case discussed with daughter on the phone Disposition Plan:  to be determined based on clinical course  Consultants:  Podiatry  Vascular surgery  Antibiotics:  Clindamycin  Levaquin  Time spent: 26 minutes  Makira Holleman [employer]  [employer] [facility]

## 2017-09-03 NOTE — Progress Notes (Signed)
Chart evaluated remotely  Pt not evaluated. MRI reviewed. Extensive osteomyelitis of posterior calcaneus. Limb salvage would require extensive debridment of posterior calcaneus leaving large defect with need for long term wound care for attempt at closure.  Given pts overall condition pt will likely benefit from BKA over limb salvage attempts. Will d/w pt and family as soon as I can make it in to hospital (possibly tomorrow.) Pt will need vascular evaluation prior to any procedure. Gwyneth RevelsFowler, Raiza Kiesel A  09/03/2017, 1:35 PM

## 2017-09-04 ENCOUNTER — Encounter: Admission: EM | Disposition: E | Payer: Self-pay | Source: Home / Self Care | Attending: Internal Medicine

## 2017-09-04 DIAGNOSIS — I70238 Atherosclerosis of native arteries of right leg with ulceration of other part of lower right leg: Secondary | ICD-10-CM

## 2017-09-04 HISTORY — PX: LOWER EXTREMITY INTERVENTION: CATH118252

## 2017-09-04 HISTORY — PX: LOWER EXTREMITY ANGIOGRAPHY: CATH118251

## 2017-09-04 LAB — BASIC METABOLIC PANEL
Anion gap: 11 (ref 5–15)
BUN: 22 mg/dL — ABNORMAL HIGH (ref 6–20)
CALCIUM: 10.2 mg/dL (ref 8.9–10.3)
CHLORIDE: 104 mmol/L (ref 101–111)
CO2: 26 mmol/L (ref 22–32)
CREATININE: 0.94 mg/dL (ref 0.44–1.00)
GFR calc non Af Amer: 56 mL/min — ABNORMAL LOW (ref >60)
GLUCOSE: 118 mg/dL — AB (ref 65–99)
Potassium: 5 mmol/L (ref 3.5–5.1)
Sodium: 141 mmol/L (ref 135–145)

## 2017-09-04 LAB — GLUCOSE, CAPILLARY
GLUCOSE-CAPILLARY: 123 mg/dL — AB (ref 65–99)
GLUCOSE-CAPILLARY: 104 mg/dL — AB (ref 65–99)
Glucose-Capillary: 111 mg/dL — ABNORMAL HIGH (ref 65–99)
Glucose-Capillary: 177 mg/dL — ABNORMAL HIGH (ref 65–99)
Glucose-Capillary: 114 mg/dL — ABNORMAL HIGH (ref 65–99)

## 2017-09-04 SURGERY — LOWER EXTREMITY ANGIOGRAPHY
Anesthesia: Moderate Sedation

## 2017-09-04 MED ORDER — CEFAZOLIN SODIUM-DEXTROSE 2-4 GM/100ML-% IV SOLN
INTRAVENOUS | Status: AC
  Filled 2017-09-04: qty 100

## 2017-09-04 MED ORDER — HEPARIN SODIUM (PORCINE) 1000 UNIT/ML IJ SOLN
INTRAMUSCULAR | Status: AC
  Filled 2017-09-04: qty 1

## 2017-09-04 MED ORDER — MIDAZOLAM HCL 2 MG/2ML IJ SOLN
INTRAMUSCULAR | Status: DC | PRN
  Administered 2017-09-04 (×2): 0.5 mg via INTRAVENOUS

## 2017-09-04 MED ORDER — CEFAZOLIN SODIUM-DEXTROSE 2-4 GM/100ML-% IV SOLN
2.0000 g | Freq: Once | INTRAVENOUS | Status: AC
  Administered 2017-09-04: 2 g via INTRAVENOUS

## 2017-09-04 MED ORDER — IOPAMIDOL (ISOVUE-300) INJECTION 61%
INTRAVENOUS | Status: DC | PRN
  Administered 2017-09-04: 50 mL via INTRAVENOUS

## 2017-09-04 MED ORDER — MIDAZOLAM HCL 2 MG/2ML IJ SOLN
INTRAMUSCULAR | Status: AC
  Filled 2017-09-04: qty 2

## 2017-09-04 MED ORDER — LEVOFLOXACIN IN D5W 500 MG/100ML IV SOLN
500.0000 mg | INTRAVENOUS | Status: DC
  Administered 2017-09-04: 500 mg via INTRAVENOUS
  Filled 2017-09-04 (×2): qty 100

## 2017-09-04 MED ORDER — FENTANYL CITRATE (PF) 100 MCG/2ML IJ SOLN
INTRAMUSCULAR | Status: DC | PRN
  Administered 2017-09-04: 12.5 ug via INTRAVENOUS
  Administered 2017-09-04: 25 ug via INTRAVENOUS

## 2017-09-04 MED ORDER — HEPARIN SODIUM (PORCINE) 1000 UNIT/ML IJ SOLN
INTRAMUSCULAR | Status: DC | PRN
  Administered 2017-09-04: 5000 [IU] via INTRAVENOUS

## 2017-09-04 MED ORDER — LIDOCAINE-EPINEPHRINE (PF) 1 %-1:200000 IJ SOLN
INTRAMUSCULAR | Status: AC
  Filled 2017-09-04: qty 30

## 2017-09-04 MED ORDER — IPRATROPIUM-ALBUTEROL 0.5-2.5 (3) MG/3ML IN SOLN: 3.0000 mL | Freq: Four times a day (QID) | RESPIRATORY_TRACT | Status: DC | PRN

## 2017-09-04 MED ORDER — SODIUM CHLORIDE 0.9 % IV SOLN
INTRAVENOUS | Status: DC
  Administered 2017-09-04: 14:00:00 via INTRAVENOUS

## 2017-09-04 MED ORDER — FENTANYL CITRATE (PF) 100 MCG/2ML IJ SOLN
INTRAMUSCULAR | Status: AC
  Filled 2017-09-04: qty 2

## 2017-09-04 MED ORDER — IPRATROPIUM-ALBUTEROL 0.5-2.5 (3) MG/3ML IN SOLN
3.0000 mL | Freq: Two times a day (BID) | RESPIRATORY_TRACT | Status: DC
  Administered 2017-09-04 – 2017-09-09 (×7): 3 mL via RESPIRATORY_TRACT
  Filled 2017-09-04 (×8): qty 3

## 2017-09-04 MED ORDER — HEPARIN (PORCINE) IN NACL 2-0.9 UNIT/ML-% IJ SOLN
INTRAMUSCULAR | Status: AC
  Filled 2017-09-04: qty 1000

## 2017-09-04 SURGICAL SUPPLY — 19 items
BALLN LUTONIX 5X150X130 (BALLOONS) ×4
BALLN ULTRVRSE 3X220X150 (BALLOONS) ×4
BALLOON LUTONIX 5X150X130 (BALLOONS) IMPLANT
BALLOON ULTRVRSE 3X220X150 (BALLOONS) IMPLANT
CATH BEACON 5 .035 65 RIM TIP (CATHETERS) ×2 IMPLANT
CATH BEACON 5 .038 100 VERT TP (CATHETERS) ×2 IMPLANT
CATH PIG 70CM (CATHETERS) ×2 IMPLANT
CATH VS15FR (CATHETERS) ×2 IMPLANT
COVER PROBE U/S 5X48 (MISCELLANEOUS) ×2 IMPLANT
DEVICE PRESTO INFLATION (MISCELLANEOUS) ×2 IMPLANT
DEVICE STARCLOSE SE CLOSURE (Vascular Products) ×2 IMPLANT
GLIDEWIRE ADV .035X180CM (WIRE) ×2 IMPLANT
PACK ANGIOGRAPHY (CUSTOM PROCEDURE TRAY) ×4 IMPLANT
SHEATH BRITE TIP 5FRX11 (SHEATH) ×2 IMPLANT
SHEATH RAABE 6FRX70 (SHEATH) ×2 IMPLANT
SYR MEDRAD MARK V 150ML (SYRINGE) ×2 IMPLANT
TUBING CONTRAST HIGH PRESS 72 (TUBING) ×2 IMPLANT
WIRE G V18X300CM (WIRE) ×2 IMPLANT
WIRE J 3MM .035X145CM (WIRE) ×2 IMPLANT

## 2017-09-04 NOTE — Op Note (Signed)
[facility] Percutaneous Study/Intervention Procedural Note   Date of Surgery: 09/08/2017  Surgeon(s):DEW,JASON   Assistants:none  Pre-operative Diagnosis: PAD with ulceration and osteomyelitis right lower extremity  Post-operative diagnosis: Same  Procedure(s) Performed: 1. Ultrasound guidance for vascular access left femoral artery 2. Catheter placement into right anterior tibial artery and right peroneal artery from left femoral approach 3. Aortogram and selective right lower extremity angiogram 4. Percutaneous transluminal angioplasty of right peroneal artery and tibioperoneal trunk with 3 mm diameter by 22 cm length angioplasty balloon 5. Percutaneous transluminal angioplasty of the right popliteal artery with 5 mm diameter by 15 cm length Lutonix drug-coated angioplasty balloon  6.  Percutaneous transluminal angioplasty of the right anterior tibial artery with 3 mm diameter by 22 cm length angioplasty balloon 7. StarClose closure device left femoral artery  EBL: 10 cc  Contrast: 50 cc  Fluoro Time: 7.9 minutes  Moderate Conscious Sedation Time: approximately 45 minutes using 1 mg of Versed and 37.5 Mcg of Fentanyl  Indications: Patient is a 82 y.o.female with poorly palpable pedal pulses with nonhealing ulcerations and osteomyelitis of the right foot. The patient is brought in for angiography for further evaluation and potential treatment. Risks and benefits are discussed and informed consent is obtained  Procedure: The patient was identified and appropriate procedural time out was performed. The patient was then placed supine on the table and prepped and draped in the usual sterile fashion.Moderate conscious sedation was administered during a face to face encounter with the patient throughout the procedure with my supervision of the RN administering  medicines and monitoring the patient's vital signs, pulse oximetry, telemetry and mental status throughout from the start of the procedure until the patient was taken to the recovery room. Ultrasound was used to evaluate the left common femoral artery. It was patent . A digital ultrasound image was acquired. A Seldinger needle was used to access the left common femoral artery under direct ultrasound guidance and a permanent image was performed. A 0.035 J wire was advanced without resistance and a 5Fr sheath was placed. Pigtail catheter was placed into the aorta and an AP aortogram was performed. This demonstrated normal renal arteries and normal aorta and iliac segments without significant stenosis. I then crossed the aortic bifurcation somewhat tediously with a VS 1 catheter due to the steep aortic bifurcation and advanced to the right femoral head. Selective right lower extremity angiogram was then performed. This demonstrated ormal common femoral artery, profunda femoris artery, and superficial femoral artery.  The popliteal artery occluded just above the knee and visualization was poor due to the knee prosthesis.  There is then reconstitution of an anterior tibial artery which had a high-grade stenosis about 6 cm beyond its origin.  The tibioperoneal trunk and peroneal artery reconstituted with what appeared to be a 70-80% stenosis at the origin of the peroneal artery and this was then a good runoff vessel distally.  The posterior tibial artery was chronically occluded. The patient was systemically heparinized and a 6 French 70 cm sheath was then placed over the Terumo Advantage wire. I then used a Kumpe catheter and the advantage wire to navigate through the popliteal occlusion and the tibioperoneal trunk stenosis and get down into the peroneal artery and confirm intraluminal flow.  I then exchanged for a 0.018 wire.  A 3 mm diameter by 22 cm length angioplasty balloon were inflated the proximal peroneal  artery and tibioperoneal trunk and taken back to pre-dilate the popliteal artery.  This was inflated to 8 atm for 1 minute.  The popliteal lesion was then treated with a 5 mm diameter by 15 cm length Lutonix drug-coated angioplasty balloon inflated to 10 atm for 1 minute from the distal popliteal artery up to the above-knee popliteal artery to encompass the occlusion.  We had to go into almost a lateral projection to see the popliteal artery after the treatment, but there was brisk flow with what appeared to be less than 20% residual stenosis of the popliteal artery, tibioperoneal trunk, and peroneal arteries after treating each vessel.  I then turned my attention to the anterior tibial artery.  Using the V 18 wire and the Kumpe catheter I was able to decannulate the anterior tibial artery without difficulty and cross the proximal stenosis in the anterior tibial artery.  I then treated the proximal and mid anterior tibial artery with the 3 mm diameter by 22 cm length angioplasty balloon inflated to 8 atm for 1 minute.  Completion angiogram showed only about a 10% residual stenosis although there was some spasm distally of the vessel.  The visualization was poor throughout the procedure not only secondary to the patient's knee prosthesis and hip prosthesis, but also the continuous motion.  There now appear to be 2 good vessels distally and I felt we had improved her perfusion is much as possible. I elected to terminate the procedure. The sheath was removed and StarClose closure device was deployed in the left femoral artery with excellent hemostatic result. The patient was taken to the recovery room in stable condition having tolerated the procedure well.  Findings:  Aortogram:Normal renal arteries and mesenteric vessels from the AP projection.  Normal aorta and iliac arteries without significant stenosis Right lower Extremity:Normal common femoral artery, profunda femoris artery, and  superficial femoral artery.  The popliteal artery occluded just above the knee and visualization was poor due to the knee prosthesis.  There is then reconstitution of an anterior tibial artery which had a high-grade stenosis about 6 cm beyond its origin.  The tibioperoneal trunk and peroneal artery reconstituted with what appeared to be a 70-80% stenosis at the origin of the peroneal artery and this was then a good runoff vessel distally.  The posterior tibial artery was chronically occluded.   Disposition: Patient was taken to the recovery room in stable condition having tolerated the procedure well.  Complications: None  Maureen Taylor 09/21/2017 12:24 PM   This note was created with Dragon Medical transcription system. Any errors in dictation are purely unintentional.

## 2017-09-04 NOTE — Progress Notes (Signed)
Patient here today for lower extremity angiogram per Dr Wyn Quakerew. Upon arrival to specials pt does not open eyes which noted per floor nurse this has been baseline. Son at bedside in specials. Attached to mointor and will follow vitals closely during procedure. Dr Wyn Quakerew aware of patient';s baseline mental status. Will be gentle with sedation as needed for procedure.

## 2017-09-04 NOTE — [place] FL2 (Signed)
[place] MEDICAID FL2 LEVEL OF CARE SCREENING TOOL     IDENTIFICATION  Patient Name: Maureen MustardJessie B Desch Birthdate: 1934-09-29 Sex: female Admission Date (Current Location): 09/08/2017  [place]   Facility and Address:  [facility], [address], [place], [place] [address]      Provider Number: [id number]  Attending Physician Name and Address:  Alford HighlandWieting, Richard, MD  Relative Name and Phone Number:       Current Level of Care: Hospital Recommended Level of Care: Skilled Nursing Facility Prior Approval Number:    Date Approved/Denied:   PASRR Number: ([id number])  Discharge Plan: SNF    Current Diagnoses: Patient Active Problem List   Diagnosis Date Noted  . Osteomyelitis (HCC) 08/26/2017  . Pressure injury of skin 06/20/2017  . SIRS (systemic inflammatory response syndrome) (HCC) 06/17/2017  . Sepsis (HCC) 10/05/2016  . Acute encephalopathy 10/05/2016  . Diabetes mellitus with complication (HCC)   . Complicated UTI (urinary tract infection)   . Acute cystitis without hematuria   . Morbid obesity (HCC)   . UTI (urinary tract infection) 08/24/2016  . Altered mental status 08/24/2016  . Constipation 08/24/2016  . Hyperlipidemia 06/05/2015  . Type 2 diabetes mellitus with diabetic polyneuropathy (HCC) 06/05/2015  . Vitamin D deficiency 06/05/2015  . Recurrent UTI 04/17/2015  . Vaginal atrophy 04/17/2015  . Incontinence 04/17/2015  . OAB (overactive bladder) 03/03/2015  . Bipolar depression (HCC) 01/05/2015  . GERD (gastroesophageal reflux disease) 01/05/2015  . Allergic rhinitis 01/05/2015  . Essential hypertension 01/05/2015  . Candida infection 01/05/2015  . Parkinson's disease (HCC) 01/05/2015  . Neuropathy 01/05/2015  . Diabetes mellitus with neuropathy (HCC) 01/05/2015    Orientation RESPIRATION BLADDER Height & Weight     Self, Time, Situation, Place  O2(2 Liters Oxygen. ) Continent Weight: 194  lb 6.4 oz (88.2 kg) Height:     BEHAVIORAL SYMPTOMS/MOOD NEUROLOGICAL BOWEL NUTRITION STATUS      Continent Diet(NPO to be advanced. )  AMBULATORY STATUS COMMUNICATION OF NEEDS Skin   Extensive Assist Verbally PU Stage and Appropriate Care(diabetic ulcer right heel. )                       Personal Care Assistance Level of Assistance  Bathing, Feeding, Dressing Bathing Assistance: Limited assistance Feeding assistance: Independent Dressing Assistance: Limited assistance     Functional Limitations Info  Sight, Hearing, Speech Sight Info: Adequate Hearing Info: Adequate Speech Info: Adequate    SPECIAL CARE FACTORS FREQUENCY  PT (By licensed PT), OT (By licensed OT)     PT Frequency: (5) OT Frequency: (5)            Contractures      Additional Factors Info  Code Status, Allergies  Isolation for MRSA Code Status Info: (Full Code. ) Allergies Info: (Codeine, Morphine And Related, Indocin Indomethacin, Vancomycin, Zosyn Piperacillin Sod-tazobactam So)           Current Medications (09/03/2017):  This is the current hospital active medication list Current Facility-Administered Medications  Medication Dose Route Frequency Provider Last Rate Last Dose  . 0.9 %  sodium chloride infusion   Intravenous Continuous Stegmayer, Kimberly A, PA-C 50 mL/hr at 09/14/2017 0031    . acetaminophen (TYLENOL) tablet 1,000 mg  1,000 mg Oral Q8H PRN Altamese DillingVachhani, Vaibhavkumar, MD   1,000 mg at 09/03/17 0634  . alum & mag hydroxide-simeth (MAALOX/MYLANTA) 200-200-20 MG/5ML suspension 30 mL  30 mL Oral Q6H PRN Elisabeth PigeonVachhani,  Heath GoldVaibhavkumar, MD      . ARIPiprazole (ABILIFY) tablet 2.5 mg  2.5 mg Oral BID Altamese DillingVachhani, Vaibhavkumar, MD   2.5 mg at 09/03/17 2252  . aspirin EC tablet 81 mg  81 mg Oral Daily Altamese DillingVachhani, Vaibhavkumar, MD   81 mg at 09/03/17 0844  . atorvastatin (LIPITOR) tablet 20 mg  20 mg Oral Daily Altamese DillingVachhani, Vaibhavkumar, MD   20 mg at 09/03/17 0844  . bisacodyl (DULCOLAX) suppository 10 mg   10 mg Rectal Daily PRN Altamese DillingVachhani, Vaibhavkumar, MD      . budesonide (PULMICORT) nebulizer solution 0.5 mg  0.5 mg Nebulization BID Alford HighlandWieting, Richard, MD   0.5 mg at 09/03/17 2004  . carbidopa-levodopa (SINEMET IR) 25-100 MG per tablet immediate release 1 tablet  1 tablet Oral BID Altamese DillingVachhani, Vaibhavkumar, MD   1 tablet at 09/03/17 2247  . Chlorhexidine Gluconate Cloth 2 % PADS 6 each  6 each Topical Q0600 Altamese DillingVachhani, Vaibhavkumar, MD   6 each at 09/14/2017 0539  . clindamycin (CLEOCIN) IVPB 600 mg  600 mg Intravenous Q8H Salary, Evelena AsaMontell D, MD   Stopped at 09/14/2017 [phone]  . diphenhydrAMINE (BENADRYL) capsule 25 mg  25 mg Oral Q6H PRN Angelina OkSalary, Montell D, MD   25 mg at 04/15/17 2222  . docusate sodium (COLACE) capsule 100 mg  100 mg Oral BID Altamese DillingVachhani, Vaibhavkumar, MD   100 mg at 09/03/17 2246  . DULoxetine (CYMBALTA) DR capsule 60 mg  60 mg Oral Daily Altamese DillingVachhani, Vaibhavkumar, MD   60 mg at 09/03/17 0844  . feeding supplement (ENSURE ENLIVE) (ENSURE ENLIVE) liquid 237 mL  237 mL Oral BID BM Altamese DillingVachhani, Vaibhavkumar, MD   237 mL at 09/03/17 1313  . folic acid (FOLVITE) tablet 1 mg  1 mg Oral Once per day on Sun Mon Tue Wed Thu Sat Altamese DillingVachhani, Vaibhavkumar, MD   1 mg at 09/03/17 1710  . furosemide (LASIX) tablet 40 mg  40 mg Oral Daily Altamese DillingVachhani, Vaibhavkumar, MD   40 mg at 09/03/17 0845  . gabapentin (NEURONTIN) capsule 200 mg  200 mg Oral QHS Altamese DillingVachhani, Vaibhavkumar, MD   200 mg at 09/03/17 2246  . heparin injection 5,000 Units  5,000 Units Subcutaneous Q8H Altamese DillingVachhani, Vaibhavkumar, MD   5,000 Units at 09/03/17 2247  . insulin aspart (novoLOG) injection 0-9 Units  0-9 Units Subcutaneous TID WC Altamese DillingVachhani, Vaibhavkumar, MD   1 Units at 09/03/17 1302  . ipratropium-albuterol (DUONEB) 0.5-2.5 (3) MG/3ML nebulizer solution 3 mL  3 mL Nebulization BID Wieting, Richard, MD      . ipratropium-albuterol (DUONEB) 0.5-2.5 (3) MG/3ML nebulizer solution 3 mL  3 mL Nebulization Q6H PRN Wieting, Richard, MD      . lamoTRIgine  (LAMICTAL) tablet 100 mg  100 mg Oral BID Altamese DillingVachhani, Vaibhavkumar, MD   100 mg at 09/03/17 2246  . Levofloxacin (LEVAQUIN) IVPB 250 mg  250 mg Intravenous Q24H Bertrum SolSalary, Montell D, MD   Stopped at 08/28/2017 0032  . methotrexate (RHEUMATREX) tablet 15 mg  15 mg Oral Q Sheppard EvensFri Vachhani, Heath GoldVaibhavkumar, MD   15 mg at 04/15/17 2200  . mupirocin ointment (BACTROBAN) 2 % 1 application  1 application Nasal BID Altamese DillingVachhani, Vaibhavkumar, MD   1 application at 09/03/17 2249  . pantoprazole (PROTONIX) EC tablet 40 mg  40 mg Oral Daily Altamese DillingVachhani, Vaibhavkumar, MD   40 mg at 09/03/17 0844  . polyethylene glycol (MIRALAX / GLYCOLAX) packet 17 g  17 g Oral BID Altamese DillingVachhani, Vaibhavkumar, MD   17 g at 09/03/17 0843  . polyvinyl alcohol (  LIQUIFILM TEARS) 1.4 % ophthalmic solution 1 drop  1 drop Both Eyes TID Altamese Dilling, MD   1 drop at 09/03/17 2251  . traMADol (ULTRAM) tablet 50 mg  50 mg Oral Q6H PRN Alford Highland, MD   50 mg at 09/03/17 1103  . vitamin C (ASCORBIC ACID) tablet 250 mg  250 mg Oral BID Altamese Dilling, MD   250 mg at 09/03/17 2247     Discharge Medications: Please see discharge summary for a list of discharge medications.  Relevant Imaging Results:  Relevant Lab Results:   Additional Information (SSN: [id number])  Lakyla Biswas, Darleen Crocker, LCSW

## 2017-09-04 NOTE — Progress Notes (Signed)
Patient back from angiogram. Alert and oriented X1. Denies pain. Family at bedside. Patient is more alert than this morning and easily aroused by touched.   Harvie HeckMelanie Deeann Servidio, RN

## 2017-09-04 NOTE — H&P (Signed)
[facility] History & Physical Update  The patient was interviewed and re-examined.  The patient's previous History and Physical has been reviewed and is unchanged.  There is no change in the plan of care. We plan to proceed with the scheduled procedure.  Nance Mccombs, MD  09/22/2017, 12:23 PM    

## 2017-09-04 NOTE — Progress Notes (Signed)
Patient off the floor for angiogram.  Long discussion with daughter in regards to future plans.  I discussed the large amount of necrotic tissue as well as infection in the posterior aspect of the heel extending deep into the calcaneus.  I discussed the likely outcome with attempted limb salvage is poor.  Discussed below the knee amputation as a viable best option.  That is my recommendation at this point.  Daughter states that family has discussed this as well and agrees that amputation will likely be her best outcome.  Plan to follow-up tomorrow to discuss with patient.

## 2017-09-04 NOTE — Progress Notes (Signed)
Patient ID: Maureen [place], female   DOB: 1935/04/09, 82 y.o.   MRN: [id number]  [facility] PROGRESS NOTE  Maureen Taylor Iden ZHY:[id number] DOB: 1935/04/09 DOA: 09/16/2017 PCP: Dorothey BasemanBronstein, David, MD  HPI/Subjective: Patient seen after procedure.  As per nursing staff was a little lethargic this morning.  Patient still lethargic after procedure.  Objective: Vitals:   09/24/2017 1250 09/24/2017 1300  BP:  127/60  Pulse: (!) 104 (!) 104  Resp: 17 17  Temp:    SpO2: 96% 99%    Filed Weights   09/20/2017 1556 09/06/2017 1954  Weight: 90.5 kg (199 lb 9.6 oz) 88.2 kg (194 lb 6.4 oz)    ROS: Review of Systems  Unable to perform ROS: Mental status change   Exam: Physical Exam  Constitutional: She appears lethargic.  HENT:  Nose: No mucosal edema.  Mouth dry  Eyes: Conjunctivae and lids are normal. Pupils are equal, round, and reactive to light.  Neck: No JVD present. Carotid bruit is not present. No edema present. No thyroid mass and no thyromegaly present.  Cardiovascular: S1 normal and S2 normal. Exam reveals no gallop.  No murmur heard. Pulses:      Dorsalis pedis pulses are 2+ on the right side, and 2+ on the left side.  Respiratory: No respiratory distress. She has decreased breath sounds in the right lower field and the left lower field. She has no wheezes. She has no rhonchi. She has no rales.  GI: Soft. Bowel sounds are normal. There is no tenderness.  Musculoskeletal:       Right ankle: She exhibits swelling.       Left ankle: She exhibits swelling.  Lymphadenopathy:    She has no cervical adenopathy.  Neurological: She appears lethargic.  Skin: Skin is warm. Nails show no clubbing.  Large area of black discoloration right heel and up into the Achilles insertion. Cheeks erythematous  Psychiatric:  Patient did follow some simple commands like squeezing my hand and wiggling her toes.      Data Reviewed: Basic Metabolic Panel: Recent Labs  Lab 09/21/2017 1731  09/02/17 0309 09/03/17 0321 09/11/2017 0440  NA 140 140 142 141  K 5.3* 4.2 4.8 5.0  CL 99* 104 103 104  CO2 27 28 27 26   GLUCOSE 153* 102* 140* 118*  BUN 35* 32* 28* 22*  CREATININE 1.48* 1.14* 1.02* 0.94  CALCIUM 10.2 9.5 10.0 10.2   Liver Function Tests: Recent Labs  Lab 09/09/2017 1731  AST 21  ALT <5*  ALKPHOS 132*  BILITOT 0.1*  PROT 7.8  ALBUMIN 3.0*   CBC: Recent Labs  Lab 09/08/2017 1731 09/02/17 0309 09/03/17 0321  WBC 18.5* 13.7* 12.2*  NEUTROABS 16.4*  --   --   HGB 12.3 10.8* 11.1*  HCT 37.7 32.6* 33.7*  MCV 89.5 88.0 89.1  PLT 565* 493* 491*    CBG: Recent Labs  Lab 09/03/17 1650 09/03/17 2323 09/10/2017 0745 09/05/2017 0751 08/26/2017 1058  GLUCAP 110* 116* 111* 123* 104*    Recent Results (from the past 240 hour(s))  Blood Culture (routine x 2)     Status: None (Preliminary result)   Collection Time: 09/11/2017  5:37 PM  Result Value Ref Range Status   Specimen Description LEFT ANTECUBITAL  Final   Special Requests   Final    BOTTLES DRAWN AEROBIC AND ANAEROBIC Blood Culture adequate volume   Culture NO GROWTH 3 DAYS  Final   Report Status PENDING  Incomplete  Blood Culture (routine  x 2)     Status: None (Preliminary result)   Collection Time: 09/09/2017  6:31 PM  Result Value Ref Range Status   Specimen Description BLOOD LEFT ARM  Final   Special Requests   Final    BOTTLES DRAWN AEROBIC AND ANAEROBIC Blood Culture adequate volume   Culture NO GROWTH 3 DAYS  Final   Report Status PENDING  Incomplete  MRSA PCR Screening     Status: Abnormal   Collection Time: 09/24/2017 10:10 PM  Result Value Ref Range Status   MRSA by PCR POSITIVE (A) NEGATIVE Final    Comment:        The GeneXpert MRSA Assay (FDA approved for NASAL specimens only), is one component of a comprehensive MRSA colonization surveillance program. It is not intended to diagnose MRSA infection nor to guide or monitor treatment for MRSA infections. RESULT CALLED TO, READ BACK BY  AND VERIFIED WITH: [facility] RAYMOND AT 0014 09/02/17.PMH      Studies: Mr Ankle Right Wo Contrast  Result Date: 09/02/2017 CLINICAL DATA:  Ulceration on the right heel for several months. EXAM: MRI OF THE RIGHT ANKLE WITHOUT CONTRAST TECHNIQUE: Multiplanar, multisequence MR imaging of the ankle was performed. No intravenous contrast was administered. COMPARISON:  Plain films of the right foot 09/17/2017. FINDINGS: The study is degraded by patient motion. TENDONS Peroneal: Intact. Posteromedial: Intact. Anterior: Intact. Achilles: Intact. Plantar Fascia: Intact. LIGAMENTS Lateral: Intact. Medial: Intact. CARTILAGE Ankle Joint: Appears normal. Subtalar Joints/Sinus Tarsi: Appear normal. Bones: There is intense marrow edema in the posterior calcaneus. Centrally and medially, edema extends approximately 2.5 cm into bone. Along the lateral periphery of the calcaneus, edema extends 3 cm anteriorly. Other: No fluid collection is identified. A deep ulceration over the heel is identified. Portions of the calcaneus appear exposed to air. IMPRESSION: Osteomyelitis in the posterior calcaneus at the site of a large skin ulceration. The Achilles tendon is intact and unremarkable. Negative for abscess or septic joint. Electronically Signed   By: Drusilla Kannerhomas  Dalessio M.D.   On: 09/02/2017 16:41   [place] Venous Img Lower Unilateral Right  Result Date: 09/03/2017 CLINICAL DATA:  82 year old female with right lower extremity pain for the past 2 weeks EXAM: RIGHT LOWER EXTREMITY VENOUS DOPPLER ULTRASOUND TECHNIQUE: Gray-scale sonography with graded compression, as well as color Doppler and duplex ultrasound were performed to evaluate the lower extremity deep venous systems from the level of the common femoral vein and including the common femoral, femoral, profunda femoral, popliteal and calf veins including the posterior tibial, peroneal and gastrocnemius veins when visible. The superficial great saphenous vein was also interrogated.  Spectral Doppler was utilized to evaluate flow at rest and with distal augmentation maneuvers in the common femoral, femoral and popliteal veins. COMPARISON:  None. FINDINGS: Contralateral Common Femoral Vein: Respiratory phasicity is normal and symmetric with the symptomatic side. No evidence of thrombus. Normal compressibility. Common Femoral Vein: No evidence of thrombus. Normal compressibility, respiratory phasicity and response to augmentation. Saphenofemoral Junction: No evidence of thrombus. Normal compressibility and flow on color Doppler imaging. Profunda Femoral Vein: No evidence of thrombus. Normal compressibility and flow on color Doppler imaging. Femoral Vein: No evidence of thrombus. Normal compressibility, respiratory phasicity and response to augmentation. Popliteal Vein: No evidence of thrombus. Normal compressibility, respiratory phasicity and response to augmentation. Calf Veins: No evidence of thrombus in the posterior tibial vein. The peroneal vein is not well seen. Normal compressibility and flow on color Doppler imaging. Superficial Great Saphenous Vein: No evidence of thrombus.  Normal compressibility. Venous Reflux:  None. Other Findings:  None. IMPRESSION: No evidence of deep venous thrombosis. Electronically Signed   By: Malachy Moan M.D.   On: 09/03/2017 14:35    Scheduled Meds: . ARIPiprazole  2.5 mg Oral BID  . aspirin EC  81 mg Oral Daily  . atorvastatin  20 mg Oral Daily  . budesonide (PULMICORT) nebulizer solution  0.5 mg Nebulization BID  . carbidopa-levodopa  1 tablet Oral BID  . Chlorhexidine Gluconate Cloth  6 each Topical Q0600  . docusate sodium  100 mg Oral BID  . DULoxetine  60 mg Oral Daily  . feeding supplement (ENSURE ENLIVE)  237 mL Oral BID BM  . folic acid  1 mg Oral Once per day on Sun Mon Tue Wed Thu Sat  . furosemide  40 mg Oral Daily  . heparin  5,000 Units Subcutaneous Q8H  . insulin aspart  0-9 Units Subcutaneous TID WC  .  ipratropium-albuterol  3 mL Nebulization BID  . lamoTRIgine  100 mg Oral BID  . methotrexate  15 mg Oral Q Fri  . mupirocin ointment  1 application Nasal BID  . pantoprazole  40 mg Oral Daily  . polyethylene glycol  17 g Oral BID  . polyvinyl alcohol  1 drop Both Eyes TID  . vitamin C  250 mg Oral BID   Continuous Infusions: . ceFAZolin    . sodium chloride 50 mL/hr at 08/30/2017 0031  . clindamycin (CLEOCIN) IV 600 mg (09/24/2017 1401)  . levofloxacin (LEVAQUIN) IV Stopped (09/20/2017 0032)    Assessment/Plan:  1. Acute on chronic osteomyelitis.  Antibiotics were then switched to clindamycin and Levaquin.  Podiatry and vascular surgery recommending amputation.  Family wants [place] to talk with the patient about that also. 2. Lethargy.  If the patient does not wake up in a couple hours I will get a CT scan and ABG and chest x-ray.  Hold gabapentin and Benadryl. 3. Type 2 diabetes mellitus.  Hold Lantus today.  Sliding scale only 4. Diabetic neuropathy.  Hold gabapentin with lethargy 5. Relative hypotension.  Hold blood pressure medications 6. Hyperlipidemia unspecified on statin 7. Parkinson's on Sinemet 8. Bipolar disorder continue psychiatric medication  Code Status:     Code Status Orders  (From admission, onward)        Start     Ordered   08/31/2017 1948  Full code  Continuous     09/02/2017 1947    Code Status History    Date Active Date Inactive Code Status Order ID Comments User Context   06/17/2017 15:52 06/24/2017 19:37 Full Code [id number]  Marguarite Arbour, MD Inpatient   10/05/2016 12:17 10/09/2016 18:37 Full Code [id number]  Gwenyth Bender, NP ED   08/24/2016 20:19 08/27/2016 19:12 Full Code [id number]  Narda Bonds, MD Inpatient    Advance Directive Documentation     Most Recent Value  Type of Advance Directive  Out of facility DNR (pink MOST or yellow form)  Pre-existing out of facility DNR order (yellow form or pink MOST form)  Pink MOST form placed in chart (order not  valid for inpatient use)  "MOST" Form in Place?  No data     Family Communication: Case discussed with family at the bedside Disposition Plan: We will be in the hospital few days after amputation  Consultants:  Podiatry  Vascular surgery  Antibiotics:  Clindamycin  Levaquin  Time spent: 30 minutes  Raeghan Demeter [employer]

## 2017-09-04 NOTE — Clinical Social Work Note (Signed)
Clinical Social Work Assessment  Patient Details  Name: Maureen [facility] MRN: [id number] Date of Birth: 01/16/1935  Date of referral:  09/10/2017               Reason for consult:  Other (Comment Required)(Patient is from [facility]. )                Permission sought to share information with:  Facility Contact Representative Permission granted to share information::  Yes, Verbal Permission Granted  Name::      [facility]  Agency::   Skilled Nursing Facility   Relationship::     Contact Information:     Housing/Transportation Living arrangements for the past 2 months:  Skilled Nursing Facility Source of Information:  Adult Children, Facility Patient Interpreter Needed:  None Criminal Activity/Legal Involvement Pertinent to Current Situation/Hospitalization:  No - Comment as needed Significant Relationships:  Adult Children, Spouse Lives with:  Facility Resident Do you feel safe going back to the place where you live?  Yes Need for family participation in patient care:  Yes (Comment)  Care giving concerns:  Patient is a long term care resident at [facility] in [place].    Social Worker assessment / plan:  Clinical Social Worker (CSW) reviewed chart and noted that patient is from [facility]. Per [facility] liaison patient is a long term care resident and can return when stable. CSW met with patient and her 2 daughters Jonie and Jennifer were at bedside. Patient's husband Bennie was able at bedside. CSW introduced self and explained role of CSW department. Patient did not participate in assessment. Per daughters patient has been at [facility] since June 2018 and they plan for her to return there. Per daughters patient is on chronic oxygen. FL2 complete and faxed out.   Plan is for patient to return to [facility] when stable. CSW will continue to follow and assist as needed.   Employment status:  Disabled (Comment on whether or not currently receiving Disability), Retired Insurance information:   Medicare PT Recommendations:  Not assessed at this time Information / Referral to community resources:  Skilled Nursing Facility  Patient/Family's Response to care:  Patient's 2 daughters are agreeable for patient to return to [facility].   Patient/Family's Understanding of and Emotional Response to Diagnosis, Current Treatment, and Prognosis:  Patient's daughters were very pleasant and thanked CSW for assistance.   Emotional Assessment Appearance:  Appears stated age Attitude/Demeanor/Rapport:  Unable to Assess Affect (typically observed):  Unable to Assess Orientation:  Oriented to Self, Oriented to Place, Fluctuating Orientation (Suspected and/or reported Sundowners) Alcohol / Substance use:  Not Applicable Psych involvement (Current and /or in the community):  No (Comment)  Discharge Needs  Concerns to be addressed:  Discharge Planning Concerns Readmission within the last 30 days:  No Current discharge risk:  Dependent with Mobility, Chronically ill Barriers to Discharge:  Continued Medical Work up   ,  M, LCSW 09/02/2017, 4:25 PM  

## 2017-09-04 NOTE — Progress Notes (Signed)
Patient clinically stable post procedure, left groin without bleeding nor hematoma. Pt's daughter as well as boyfriend at bedside. Dr Wyn Quakerew out to speak with family at length regarding procedure as well as potential need for amputation with multiple questions answered. Pt remains fairly somulent post procedure as remains same from pre procedure. Vitals stable at this time. Report called to care nurse with questions answered, plan reviewed.

## 2017-09-04 NOTE — Care Management Important Message (Signed)
Important Message  Patient Details  Name: Maureen MustardJessie B Versteeg MRN: [id number] Date of Birth: December 11, 1934   Medicare Important Message Given:  Yes    Collie SiadAngela Avier Jech, RN 09/21/2017, 7:17 AM

## 2017-09-05 ENCOUNTER — Encounter: Payer: Self-pay | Admitting: Vascular Surgery

## 2017-09-05 DIAGNOSIS — I70238 Atherosclerosis of native arteries of right leg with ulceration of other part of lower right leg: Secondary | ICD-10-CM

## 2017-09-05 LAB — CBC
HEMATOCRIT: 29.1 % — AB (ref 35.0–47.0)
HEMOGLOBIN: 9.8 g/dL — AB (ref 12.0–16.0)
MCH: 29.6 pg (ref 26.0–34.0)
MCHC: 33.7 g/dL (ref 32.0–36.0)
MCV: 88 fL (ref 80.0–100.0)
Platelets: 411 10*3/uL (ref 150–440)
RBC: 3.31 MIL/uL — ABNORMAL LOW (ref 3.80–5.20)
RDW: 15.6 % — ABNORMAL HIGH (ref 11.5–14.5)
WBC: 9.8 10*3/uL (ref 3.6–11.0)

## 2017-09-05 LAB — BASIC METABOLIC PANEL
ANION GAP: 9 (ref 5–15)
BUN: 15 mg/dL (ref 6–20)
CHLORIDE: 103 mmol/L (ref 101–111)
CO2: 28 mmol/L (ref 22–32)
Calcium: 9.7 mg/dL (ref 8.9–10.3)
Creatinine, Ser: 0.82 mg/dL (ref 0.44–1.00)
GFR calc non Af Amer: >60 mL/min (ref >60)
Glucose, Bld: 133 mg/dL — ABNORMAL HIGH (ref 65–99)
Potassium: 4.1 mmol/L (ref 3.5–5.1)
Sodium: 140 mmol/L (ref 135–145)

## 2017-09-05 LAB — GLUCOSE, CAPILLARY
GLUCOSE-CAPILLARY: 127 mg/dL — AB (ref 65–99)
Glucose-Capillary: 201 mg/dL — ABNORMAL HIGH (ref 65–99)
Glucose-Capillary: 161 mg/dL — ABNORMAL HIGH (ref 65–99)
Glucose-Capillary: 187 mg/dL — ABNORMAL HIGH (ref 65–99)
Glucose-Capillary: 173 mg/dL — ABNORMAL HIGH (ref 65–99)

## 2017-09-05 MED ORDER — METOPROLOL TARTRATE 25 MG PO TABS: 12.5000 mg | ORAL_TABLET | Freq: Two times a day (BID) | ORAL | Status: DC

## 2017-09-05 MED ORDER — INSULIN GLARGINE 100 UNIT/ML ~~LOC~~ SOLN
8.0000 [IU] | Freq: Every day | SUBCUTANEOUS | Status: DC
  Administered 2017-09-05: 8 [IU] via SUBCUTANEOUS
  Filled 2017-09-05 (×2): qty 0.08

## 2017-09-05 MED ORDER — METRONIDAZOLE 0.75 % EX GEL
Freq: Two times a day (BID) | CUTANEOUS | Status: DC
  Administered 2017-09-05: 1 via TOPICAL
  Administered 2017-09-06 – 2017-09-09 (×6): via TOPICAL
  Filled 2017-09-05: qty 45

## 2017-09-05 MED ORDER — MAGNESIUM OXIDE 400 (241.3 MG) MG PO TABS
400.0000 mg | ORAL_TABLET | Freq: Every day | ORAL | Status: DC
  Administered 2017-09-06 – 2017-09-09 (×3): 400 mg via ORAL
  Filled 2017-09-05 (×3): qty 1

## 2017-09-05 MED ORDER — SODIUM CHLORIDE 0.9 % IV BOLUS (SEPSIS)
500.0000 mL | Freq: Once | INTRAVENOUS | Status: AC
  Administered 2017-09-05: 500 mL via INTRAVENOUS

## 2017-09-05 MED ORDER — GERHARDT'S BUTT CREAM
TOPICAL_CREAM | Freq: Two times a day (BID) | CUTANEOUS | Status: AC
  Administered 2017-09-05 – 2017-09-09 (×6): via TOPICAL
  Administered 2017-09-10: 1 via TOPICAL
  Administered 2017-09-10 – 2017-09-11 (×3): via TOPICAL
  Filled 2017-09-05 (×2): qty 1

## 2017-09-05 MED ORDER — VANCOMYCIN HCL 10 G IV SOLR
1250.0000 mg | INTRAVENOUS | Status: DC
  Administered 2017-09-05 – 2017-09-08 (×4): 1250 mg via INTRAVENOUS
  Filled 2017-09-05 (×5): qty 1250

## 2017-09-05 MED ORDER — DEXTROSE 5 % IV SOLN
2.0000 g | Freq: Two times a day (BID) | INTRAVENOUS | Status: DC
  Administered 2017-09-05 – 2017-09-11 (×12): 2 g via INTRAVENOUS
  Filled 2017-09-05 (×15): qty 2

## 2017-09-05 NOTE — Progress Notes (Signed)
Clinical Child psychotherapistocial Worker (CSW) made [name] lia[facility] aware that patient's family is deciding about an AKA per chart. Plan is for patient to D/C back to [facility] when medically stable.   Baker Hughes IncorporatedBailey Ryker [place], LCSW [phone]

## 2017-09-05 NOTE — Progress Notes (Signed)
Patient alert and oriented to self and place this morning. Patient answering questions appropriately. RN turning patient q2hrs. Dressing changed on both heels and barrier cream applied to buttocks. Vitals stable.   Harvie HeckMelanie Mackensie Pilson, RN

## 2017-09-05 NOTE — Progress Notes (Signed)
Patient and family are wanting help with decisions for surgery, family wants Dr. Dew to knowWyn [facility] that they are wanting to have the amputation on Thursday if that is possible. Palliative consult also ordered.   Maureen HeckMelanie Blake Goya, RN

## 2017-09-05 NOTE — Progress Notes (Signed)
BP 84/46 MD notified ordered 500cc bolus.   Harvie HeckMelanie Shakema Surita, RN

## 2017-09-05 NOTE — Consult Note (Signed)
WOC Nurse wound consult note Reason for Consult: Unstageable pressure injury to right heel.  Stage 2 pressure injury to coccyx, present on admission.  Limited bed mobility, osteomyelitis of right heel with PVD.  Will order a mattress with low air loss feature.  Continue to turn and reposition every two hours as needed.  Wound type:pressure to right heel Pressure and moisture to coccyx  Pressure Injury POA: Yes Measurement: coccyx:  1 cm x 0.3 cm x 0.2 cm  Pink and moist Right heel: 4 cm x 3.4 cm 100% slough to wound bed Wound bed: slough to wound bed of heel Coccyx pink and moist Drainage (amount, consistency, odor) moderate serosanguinous to heel.  Foul necrotic odor Scant weeping to coccyx wound Periwound:intact  Blanchable erythema to coccyx periwound.   Dressing procedure/placement/frequency: Awaiting surgical decision regarding right heel.  Debridement vs BKA. Right heel with silicone border foam in place and bilateral offloading heel boots in place. Turn and reposition every two hours.  Gerhardt butt paste to sacrum and coccyx twice daily.  Mattress with low air loss feature to bed.  Will not follow at this time.  Please re-consult if needed.  Maple HudsonKaren Ethelyne Erich RN BSN CWON Pager [phone]

## 2017-09-05 NOTE — Progress Notes (Signed)
Post bolus BP 101/51, IV access compromised and IV team consult in. Patient asymptomatic. MD aware, no new orders at this time.   Harvie HeckMelanie Alyas Creary, RN

## 2017-09-05 NOTE — Progress Notes (Signed)
Pharmacy Antibiotic Note  Maureen Taylor is a 82 y.o. female admitted on 09/06/2017 with Osteomyelitis.  Pharmacy has been consulted for vancomycin and cefepime dosing.  Patient was originally started on Vancomycin and Zosyn - However it was reported that patient had redness through out body. Vanc and Zosyn were discontinued and levofloxacin and clinda were started.   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   Plan: Ke:0.049   T1/2: 14.4   VD: 44  Restart vancomycin at 1250mg  IV every 24 hours over 3 hours. Calculated trough at Css is 17. Trough level ordered prior to 4th dose. Will monitor renal function and adjust dose as needed.  Start cefepime 2g IV every 12 hours based on current CrCl <4760ml/min.    Height: 5' (152.4 cm) Weight: 194 lb (88 kg) IBW/kg (Calculated) : 45.5  Temp (24hrs), Avg:98.3 F (36.8 C), Min:97.7 F (36.5 C), Max:99.7 F (37.6 C)  Recent Labs  Lab 09/14/2017 1731 09/24/2017 1734 09/02/17 0309 09/03/17 0321 2017/06/29 0440 09/05/17 0300  WBC 18.5*  --  13.7* 12.2*  --  9.8  CREATININE 1.48*  --  1.14* 1.02* 0.94 0.82  LATICACIDVEN  --  1.4  --   --   --   --     Estimated Creatinine Clearance: 54 mL/min (by C-G formula based on SCr of 0.82 mg/dL).    Allergies  Allergen Reactions  . Codeine Itching  . Morphine And Related Itching  . Indocin [Indomethacin] Rash  . Vancomycin Rash    Rash  . Zosyn [Piperacillin Sod-Tazobactam So] Rash    Antimicrobials this admission: Anti-infectives (From admission, onward)   Start     Dose/Rate Route Frequency Ordered Stop   09/05/17 1000  ceFEPIme (MAXIPIME) 2 g in dextrose 5 % 50 mL IVPB     2 g 100 mL/hr over 30 Minutes Intravenous Every 12 hours 09/05/17 0829     09/05/17 0900  vancomycin (VANCOCIN) 1,250 mg in sodium chloride 0.9 % 250 mL IVPB     1,250 mg 83.3 mL/hr over 180 Minutes Intravenous Every 24 hours 09/05/17 0829     2017/06/29 1800   levofloxacin (LEVAQUIN) IVPB 500 mg  Status:  Discontinued     500 mg 100 mL/hr over 60 Minutes Intravenous Every 24 hours 2017/06/29 1605 09/05/17 0824   2017/06/29 1337  ceFAZolin (ANCEF) 2-4 GM/100ML-% IVPB    Comments:  Talbert ForestBrogan, Terry   : cabinet override      2017/06/29 1337 09/05/17 0144   2017/06/29 1200  ceFAZolin (ANCEF) IVPB 2g/100 mL premix     2 g 200 mL/hr over 30 Minutes Intravenous  Once 2017/06/29 1146 2017/06/29 1401   09/03/17 2200  Levofloxacin (LEVAQUIN) IVPB 250 mg  Status:  Discontinued     250 mg 50 mL/hr over 60 Minutes Intravenous Every 24 hours 09/02/17 0159 2017/06/29 1605   09/02/17 0600  clindamycin (CLEOCIN) IVPB 600 mg  Status:  Discontinued     600 mg 100 mL/hr over 30 Minutes Intravenous Every 8 hours 09/02/17 0145 09/05/17 0824   09/02/17 0200  vancomycin (VANCOCIN) 1,250 mg in sodium chloride 0.9 % 250 mL IVPB  Status:  Discontinued     1,250 mg 166.7 mL/hr over 90 Minutes Intravenous Every 48 hours 08/31/2017 1910 08/26/2017 2214   09/02/17 0200  levofloxacin (LEVAQUIN) IVPB 500 mg     500 mg 100 mL/hr over 60 Minutes Intravenous  Once 09/02/17 0145 09/02/17 0502  Oct 11, 2016 2200  piperacillin-tazobactam (ZOSYN) IVPB 3.375 g  Status:  Discontinued     3.375 g 12.5 mL/hr over 240 Minutes Intravenous Every 8 hours Oct 11, 2016 1907 Oct 11, 2016 2214   Oct 11, 2016 1630  piperacillin-tazobactam (ZOSYN) IVPB 3.375 g     3.375 g 100 mL/hr over 30 Minutes Intravenous  Once Oct 11, 2016 1628 Oct 11, 2016 2001   Oct 11, 2016 1630  vancomycin (VANCOCIN) IVPB 1000 mg/200 mL premix     1,000 mg 200 mL/hr over 60 Minutes Intravenous  Once Oct 11, 2016 1628 Oct 11, 2016 2038      Microbiology results: Recent Results (from the past 240 hour(s))  Blood Culture (routine x 2)     Status: None (Preliminary result)   Collection Time: Oct 11, 2016  5:37 PM  Result Value Ref Range Status   Specimen Description LEFT ANTECUBITAL  Final   Special Requests   Final    BOTTLES DRAWN AEROBIC AND ANAEROBIC Blood Culture  adequate volume   Culture NO GROWTH 4 DAYS  Final   Report Status PENDING  Incomplete  Blood Culture (routine x 2)     Status: None (Preliminary result)   Collection Time: Oct 11, 2016  6:31 PM  Result Value Ref Range Status   Specimen Description BLOOD LEFT ARM  Final   Special Requests   Final    BOTTLES DRAWN AEROBIC AND ANAEROBIC Blood Culture adequate volume   Culture NO GROWTH 4 DAYS  Final   Report Status PENDING  Incomplete  MRSA PCR Screening     Status: Abnormal   Collection Time: Oct 11, 2016 10:10 PM  Result Value Ref Range Status   MRSA by PCR POSITIVE (A) NEGATIVE Final    Comment:        The GeneXpert MRSA Assay (FDA approved for NASAL specimens only), is one component of a comprehensive MRSA colonization surveillance program. It is not intended to diagnose MRSA infection nor to guide or monitor treatment for MRSA infections. RESULT CALLED TO, READ BACK BY AND VERIFIED WITH: [facility] RAYMOND AT 0014 09/02/17.PMH      Thank you for allowing pharmacy to be a part of this patient's care.  Gardner CandleSheema M Chole Driver, PharmD, BCPS Clinical Pharmacist 09/05/2017 8:35 AM

## 2017-09-05 NOTE — Progress Notes (Signed)
Patient ID: TANEE HENERY, female   DOB: 11-06-1934, 82 y.o.   MRN: [id number]  Patient ID: OTTIE NEGLIA, female   DOB: 16-Apr-1935, 82 y.o.   MRN: [id number]  [employer] PROGRESS NOTE  TERRIL AMARO NWG:[id number] DOB: 01-Feb-1935 DOA: 09/17/2017 PCP: Dorothey Baseman, MD  HPI/Subjective: Patient seen after procedure.  As per nursing staff was a little lethargic this morning.  Patient still lethargic after procedure.  Objective: Vitals:   09/05/17 0458 09/05/17 0740  BP: (!) 91/46 (!) 123/45  Pulse: 96 (!) 103  Resp: 19 18  Temp: 98.1 F (36.7 C) 97.7 F (36.5 C)  SpO2: 95% 96%    Filed Weights   09/20/2017 1556 08/26/2017 1954 09-08-2017 1632  Weight: 90.5 kg (199 lb 9.6 oz) 88.2 kg (194 lb 6.4 oz) 88 kg (194 lb)    ROS: Review of Systems  Constitutional: Negative for chills and fever.  Respiratory: Positive for cough. Negative for shortness of breath.   Cardiovascular: Negative for chest pain.  Gastrointestinal: Negative for abdominal pain, constipation, diarrhea, nausea and vomiting.  Genitourinary: Negative for dysuria.  Musculoskeletal: Positive for joint pain.  Neurological: Negative for headaches.   Exam: Physical Exam  Constitutional: She appears lethargic.  HENT:  Nose: No mucosal edema.  Mouth dry  Eyes: Conjunctivae and lids are normal. Pupils are equal, round, and reactive to light.  Neck: No JVD present. Carotid bruit is not present. No edema present. No thyroid mass and no thyromegaly present.  Cardiovascular: S1 normal and S2 normal. Exam reveals no gallop.  No murmur heard. Pulses:      Dorsalis pedis pulses are 2+ on the right side, and 2+ on the left side.  Respiratory: No respiratory distress. She has decreased breath sounds in the right lower field and the left lower field. She has no wheezes. She has no rhonchi. She has no rales.  GI: Soft. Bowel sounds are normal. There is no tenderness.  Musculoskeletal:       Right ankle: She exhibits  swelling.       Left ankle: She exhibits swelling.  Lymphadenopathy:    She has no cervical adenopathy.  Neurological: She appears lethargic.  Skin: Skin is warm. Nails show no clubbing.  Large area of black discoloration right heel and now with foul-smelling drainage. Cheeks erythematous. As per nursing staff, lots of skin tears and stage II areas on the buttock  Psychiatric:  Patient did follow some simple commands like squeezing my hand and wiggling her toes.      Data Reviewed: Basic Metabolic Panel: Recent Labs  Lab 08/31/2017 1731 09/02/17 0309 09/03/17 0321 09/08/2017 0440 09/05/17 0300  NA 140 140 142 141 140  K 5.3* 4.2 4.8 5.0 4.1  CL 99* 104 103 104 103  CO2 27 28 27 26 28   GLUCOSE 153* 102* 140* 118* 133*  BUN 35* 32* 28* 22* 15  CREATININE 1.48* 1.14* 1.02* 0.94 0.82  CALCIUM 10.2 9.5 10.0 10.2 9.7   Liver Function Tests: Recent Labs  Lab 09/21/2017 1731  AST 21  ALT <5*  ALKPHOS 132*  BILITOT 0.1*  PROT 7.8  ALBUMIN 3.0*   CBC: Recent Labs  Lab 09/25/2017 1731 09/02/17 0309 09/03/17 0321 09/05/17 0300  WBC 18.5* 13.7* 12.2* 9.8  NEUTROABS 16.4*  --   --   --   HGB 12.3 10.8* 11.1* 9.8*  HCT 37.7 32.6* 33.7* 29.1*  MCV 89.5 88.0 89.1 88.0  PLT 565* 493* 491* 411  CBG: Recent Labs  Lab 08/29/2017 1058 09/18/2017 1709 08/31/2017 2102 09/05/17 0752 09/05/17 1139  GLUCAP 104* 177* 114* 127* 201*    Recent Results (from the past 240 hour(s))  Blood Culture (routine x 2)     Status: None (Preliminary result)   Collection Time: 25-Nov-2016  5:37 PM  Result Value Ref Range Status   Specimen Description LEFT ANTECUBITAL  Final   Special Requests   Final    BOTTLES DRAWN AEROBIC AND ANAEROBIC Blood Culture adequate volume   Culture NO GROWTH 4 DAYS  Final   Report Status PENDING  Incomplete  Blood Culture (routine x 2)     Status: None (Preliminary result)   Collection Time: 25-Nov-2016  6:31 PM  Result Value Ref Range Status   Specimen Description  BLOOD LEFT ARM  Final   Special Requests   Final    BOTTLES DRAWN AEROBIC AND ANAEROBIC Blood Culture adequate volume   Culture NO GROWTH 4 DAYS  Final   Report Status PENDING  Incomplete  MRSA PCR Screening     Status: Abnormal   Collection Time: 25-Nov-2016 10:10 PM  Result Value Ref Range Status   MRSA by PCR POSITIVE (A) NEGATIVE Final    Comment:        The GeneXpert MRSA Assay (FDA approved for NASAL specimens only), is one component of a comprehensive MRSA colonization surveillance program. It is not intended to diagnose MRSA infection nor to guide or monitor treatment for MRSA infections. RESULT CALLED TO, READ BACK BY AND VERIFIED WITH: [facility] RAYMOND AT 0014 09/02/17.PMH      Studies: [place] Venous Img Lower Unilateral Right  Result Date: 09/03/2017 CLINICAL DATA:  82 year old female with right lower extremity pain for the past 2 weeks EXAM: RIGHT LOWER EXTREMITY VENOUS DOPPLER ULTRASOUND TECHNIQUE: Gray-scale sonography with graded compression, as well as color Doppler and duplex ultrasound were performed to evaluate the lower extremity deep venous systems from the level of the common femoral vein and including the common femoral, femoral, profunda femoral, popliteal and calf veins including the posterior tibial, peroneal and gastrocnemius veins when visible. The superficial great saphenous vein was also interrogated. Spectral Doppler was utilized to evaluate flow at rest and with distal augmentation maneuvers in the common femoral, femoral and popliteal veins. COMPARISON:  None. FINDINGS: Contralateral Common Femoral Vein: Respiratory phasicity is normal and symmetric with the symptomatic side. No evidence of thrombus. Normal compressibility. Common Femoral Vein: No evidence of thrombus. Normal compressibility, respiratory phasicity and response to augmentation. Saphenofemoral Junction: No evidence of thrombus. Normal compressibility and flow on color Doppler imaging. Profunda Femoral  Vein: No evidence of thrombus. Normal compressibility and flow on color Doppler imaging. Femoral Vein: No evidence of thrombus. Normal compressibility, respiratory phasicity and response to augmentation. Popliteal Vein: No evidence of thrombus. Normal compressibility, respiratory phasicity and response to augmentation. Calf Veins: No evidence of thrombus in the posterior tibial vein. The peroneal vein is not well seen. Normal compressibility and flow on color Doppler imaging. Superficial Great Saphenous Vein: No evidence of thrombus. Normal compressibility. Venous Reflux:  None. Other Findings:  None. IMPRESSION: No evidence of deep venous thrombosis. Electronically Signed   By: Malachy MoanHeath  McCullough M.D.   On: 09/03/2017 14:35    Scheduled Meds: . ARIPiprazole  2.5 mg Oral BID  . aspirin EC  81 mg Oral Daily  . atorvastatin  20 mg Oral Daily  . budesonide (PULMICORT) nebulizer solution  0.5 mg Nebulization BID  . carbidopa-levodopa  1 tablet  Oral BID  . Chlorhexidine Gluconate Cloth  6 each Topical Q0600  . docusate sodium  100 mg Oral BID  . DULoxetine  60 mg Oral Daily  . feeding supplement (ENSURE ENLIVE)  237 mL Oral BID BM  . folic acid  1 mg Oral Once per day on Sun Mon Tue Wed Thu Sat  . furosemide  40 mg Oral Daily  . Gerhardt's butt cream   Topical BID  . heparin  5,000 Units Subcutaneous Q8H  . insulin aspart  0-9 Units Subcutaneous TID WC  . ipratropium-albuterol  3 mL Nebulization BID  . lamoTRIgine  100 mg Oral BID  . methotrexate  15 mg Oral Q Fri  . metroNIDAZOLE   Topical BID  . mupirocin ointment  1 application Nasal BID  . pantoprazole  40 mg Oral Daily  . polyethylene glycol  17 g Oral BID  . polyvinyl alcohol  1 drop Both Eyes TID  . vitamin C  250 mg Oral BID   Continuous Infusions: . ceFEPime (MAXIPIME) IV    . vancomycin 1,250 mg (09/05/17 1001)    Assessment/Plan:  1. Acute on chronic osteomyelitis.  Case discussed with pharmacist.  I do not think the  vancomycin and Zosyn were an allergy.  Restart vancomycin at a slower infusion rate and cefepime.  I spoke with patient earlier about surgical procedure that will be needed.  I am not sure if the patient fully understood.  I came back while family was there.  They were waiting for vascular and podiatry to talk with them. 2. Type 2 diabetes mellitus.  Restart low-dose Lantus tonight.  Sliding scale ordered 3. Diabetic neuropathy.  Hold gabapentin with lethargy 4. Relative hypotension.  Hold most blood pressure medications.  Since blood pressure little bit better and tachycardic we will try to restart low-dose metoprolol this evening 5. Hyperlipidemia unspecified on statin 6. Parkinson's on Sinemet 7. Bipolar disorder continue psychiatric medication  Code Status:     Code Status Orders  (From admission, onward)        Start     Ordered   09/17/2017 1948  Full code  Continuous     09/18/2017 1947    Code Status History    Date Active Date Inactive Code Status Order ID Comments User Context   06/17/2017 15:52 06/24/2017 19:37 Full Code [id number]  Marguarite ArbourSparks, Jeffrey D, MD Inpatient   10/05/2016 12:17 10/09/2016 18:37 Full Code [id number]  Gwenyth BenderBlack, Karen M, NP ED   08/24/2016 20:19 08/27/2016 19:12 Full Code [id number]  Narda BondsNettey, Ralph A, MD Inpatient    Advance Directive Documentation     Most Recent Value  Type of Advance Directive  Out of facility DNR (pink MOST or yellow form)  Pre-existing out of facility DNR order (yellow form or pink MOST form)  Pink MOST form placed in chart (order not valid for inpatient use)  "MOST" Form in Place?  No data     Family Communication: Case discussed with family at the bedside Disposition Plan: We will be in the hospital few days after amputation  Consultants:  Podiatry  Vascular surgery  Antibiotics:  Clindamycin  Levaquin  Time spent: 25 minutes  Terita Hejl [employer]  [employer]

## 2017-09-05 NOTE — Progress Notes (Signed)
Pt is alert, oriented to self. Dsg to right groin dry and intact with no hematoma or bleeding at sited.  Foley and IV patent. Sacral dsg changed. Pt repositioned every 2 hours. No acute distress noted. Will continue to monitor

## 2017-09-05 NOTE — Evaluation (Signed)
Clinical/Bedside Swallow Evaluation Patient Details  Name: Maureen Taylor MRN: [id number] Date of Birth: December 31, 1934  Today's Date: 09/05/2017 Time: SLP Start Time (ACUTE ONLY): 1600 SLP Stop Time (ACUTE ONLY): 1700 SLP Time Calculation (min) (ACUTE ONLY): 60 min  Past Medical History:  Past Medical History:  Diagnosis Date  . Acute encephalopathy   . Allergic rhinitis   . Ascorbic acid deficiency   . Bipolar affective disorder (HCC)   . Candidiasis   . Charcot's joint   . Chronic constipation   . Dysuria   . GERD (gastroesophageal reflux disease)   . History of falling   . HLD (hyperlipidemia)   . HTN (hypertension)   . Magnesium deficiency   . Major depressive disorder   . Mixed incontinence   . Morbid obesity (HCC)   . Muscle weakness   . Neuropathic pain   . Neuropathy   . OAB (overactive bladder)   . Obesity   . Oral pain   . Parkinson disease (HCC)   . Pneumonia   . Protein calorie malnutrition (HCC)   . Rosacea   . Sepsis (HCC) 09/2016  . Slow transit constipation   . Type 2 diabetes mellitus with diabetic polyneuropathy, with long-term current use of insulin (HCC)   . Urinary tract infection without hematuria   . Vaginal atrophy   . Vitamin deficiency    Past Surgical History:  Past Surgical History:  Procedure Laterality Date  . ABDOMINAL HYSTERECTOMY    . CERVICAL CONIZATION W/BX    . CHOLECYSTECTOMY    . LOWER EXTREMITY ANGIOGRAPHY N/A 2016/11/07   Procedure: Lower Extremity Angiography;  Surgeon: Annice Needyew, Jason S, MD;  Location: [facility] INVASIVE CV LAB;  Service: Cardiovascular;  Laterality: N/A;  . LOWER EXTREMITY INTERVENTION  2016/11/07   Procedure: LOWER EXTREMITY INTERVENTION;  Surgeon: Annice Needyew, Jason S, MD;  Location: [facility] INVASIVE CV LAB;  Service: Cardiovascular;;  . REVISION TOTAL KNEE ARTHROPLASTY Right    HPI:      Assessment / Plan / Recommendation Clinical Impression  Pt appears to present w/ an adequate oropharyngeal phase swallow function  w/ no overt s/s of aspiration noted during po trials given today. Pt is missing most dentition(baseline) and would benefit from a mech soft diet, cut/chopped meats and cooked foods to ease the exertion of mastication of solids especially meats(conserve energy).  SLP Visit Diagnosis: Dysphagia, unspecified (R13.10)    Aspiration Risk  (reduced following general aspiration precautions)    Diet Recommendation  Mech Soft diet w/ well-cut and moistened meats/foods; Thin liquids. General aspiration precautions.  Medication Administration: Whole meds with puree    Other  Recommendations Recommended Consults: (Dietician f/u) Oral Care Recommendations: Oral care BID;Patient independent with oral care;Staff/trained caregiver to provide oral care Other Recommendations: (n/a)   Follow up Recommendations None      Frequency and Duration (n/a)  (n/a)       Prognosis Prognosis for Safe Diet Advancement: Good Barriers to Reach Goals: (n/a)      Swallow Study   General Date of Onset: 09/07/2017 Type of Study: Bedside Swallow Evaluation Previous Swallow Assessment: none reported Diet Prior to this Study: Regular;Thin liquids Temperature Spikes Noted: No(wbc 9.8) Respiratory Status: Nasal cannula(2-3 liters) History of Recent Intubation: No Behavior/Cognition: Alert;Cooperative;Pleasant mood(min drowsy but alert) Oral Cavity Assessment: Within Functional Limits Oral Care Completed by SLP: Recent completion by staff Oral Cavity - Dentition: Missing dentition(few bottom dentition only; no upper dentition baseline) Vision: Functional for self-feeding Self-Feeding Abilities: Able to feed self;Needs assist;Needs  set up Patient Positioning: Upright in bed Baseline Vocal Quality: Low vocal intensity Volitional Cough: Congested(min) Volitional Swallow: Able to elicit    Oral/Motor/Sensory Function Overall Oral Motor/Sensory Function: Within functional limits   Ice Chips Ice chips: Not tested Other  Comments: on meals   Thin Liquid Thin Liquid: Within functional limits Presentation: Self Fed;Straw(~4 ozs total)    Nectar Thick Nectar Thick Liquid: Not tested   Honey Thick Honey Thick Liquid: Not tested   Puree Puree: Within functional limits Presentation: Self Fed;Spoon(assisted; 5 trials)   Solid   GO   Solid: Not tested Other Comments: wanted to wait until dinner    Functional Assessment Tool Used: clinical judgement Functional Limitations: Swallowing Swallow Current Status ([id number]): At least 1 percent but less than 20 percent impaired, limited or restricted Swallow Goal Status [phone]([id number]): At least 1 percent but less than 20 percent impaired, limited or restricted Swallow Discharge Status [phone]([id number]): At least 1 percent but less than 20 percent impaired, limited or restricted    Jerilynn SomKatherine Watson, MS, CCC-SLP Watson,Katherine 09/05/2017,4:56 PM

## 2017-09-05 NOTE — Progress Notes (Signed)
[facility]  Daily Progress Note   Subjective  - 1 Day Post-Op  More awake and alert today.  Able to follow commands.  She is able to straighten her knee fairly well today.  No major events overnight.  No fever or chills.  Objective Vitals:   08/27/2017 1946 09/05/2017 2001 09/05/17 0458 09/05/17 0740  BP: 130/61  (!) 91/46 (!) 123/45  Pulse: (!) 105  96 (!) 103  Resp: 18  19 18   Temp: 99.7 F (37.6 C)  98.1 F (36.7 C) 97.7 F (36.5 C)  TempSrc: Axillary  Oral Oral  SpO2: 97% 98% 95% 96%  Weight:      Height:        Intake/Output Summary (Last 24 hours) at 09/05/2017 1130 Last data filed at 09/05/2017 0900 Gross per 24 hour  Intake 1832.5 ml  Output 350 ml  Net 1482.5 ml    PULM  CTAB CV  Regular, tachycardic VASC  foot warm, 1+ AT pulse present.  Wound is dressed  Laboratory CBC    Component Value Date/Time   WBC 9.8 09/05/2017 0300   HGB 9.8 (L) 09/05/2017 0300   HGB 12.8 06/01/2014 0414   HCT 29.1 (L) 09/05/2017 0300   HCT 38.8 06/01/2014 0414   PLT 411 09/05/2017 0300   PLT 235 06/01/2014 0414    BMET    Component Value Date/Time   NA 140 09/05/2017 0300   NA 137 01/10/2017   NA 142 05/31/2014 0455   K 4.1 09/05/2017 0300   K 3.7 05/31/2014 0455   CL 103 09/05/2017 0300   CL 107 05/31/2014 0455   CO2 28 09/05/2017 0300   CO2 27 05/31/2014 0455   GLUCOSE 133 (H) 09/05/2017 0300   GLUCOSE 234 (H) 05/31/2014 0455   BUN 15 09/05/2017 0300   BUN 19 01/10/2017   BUN 12 05/31/2014 0455   CREATININE 0.82 09/05/2017 0300   CREATININE 1.09 05/31/2014 0455   CALCIUM 9.7 09/05/2017 0300   CALCIUM 8.4 (L) 05/31/2014 0455   GFRNONAA >60 09/05/2017 0300   GFRNONAA 49 (L) 05/31/2014 0455   GFRAA >60 09/05/2017 0300   GFRAA 57 (L) 05/31/2014 0455    Assessment/Planning: POD #1 s/p right lower extremity revascularization   More alert and awake today.  Had a long discussion with she, her daughter, and her boyfriend regarding her  right foot.  Podiatry was present as well.  She does not really have enough foot that is salvageable and at this point a amputation at either the below knee or the above-knee level would be recommended.  He is more responsive today and able to straighten her knee so a below-knee amputation would be more reasonable.  It was discussed with she and her family that she would have to participate with physical therapy and work to keep her knee straight because if she gets a contracture, she would likely have to be revised to an above-knee amputation later.  She is a reasonably poor candidate for a prosthesis, but today she has at least some hope of that making a below-knee amputation at least an option to consider  An above-knee amputation would have the highest chance of healing and not requiring secondary surgery but would almost certainly make her wheelchair bound or bedridden going forward.  She and her family are not ready to make a decision at this time.  Should they decide today, consideration for amputation this week would be given.    Festus BarrenJason Trypp Heckmann  09/05/2017, 11:30 AM

## 2017-09-05 NOTE — Progress Notes (Signed)
Pt seen today with vascular surgery.  Discussion involved BKA vs AKA.  Pt understands limb salvage chance is minimal and amputation if likely best option. Daughter present for discussion.  They will make decision and call vascular for final decision.  Will sign off for now.

## 2017-09-06 DIAGNOSIS — I70261 Atherosclerosis of native arteries of extremities with gangrene, right leg: Secondary | ICD-10-CM

## 2017-09-06 DIAGNOSIS — M86171 Other acute osteomyelitis, right ankle and foot: Secondary | ICD-10-CM

## 2017-09-06 DIAGNOSIS — Z7189 Other specified counseling: Secondary | ICD-10-CM

## 2017-09-06 DIAGNOSIS — E11621 Type 2 diabetes mellitus with foot ulcer: Secondary | ICD-10-CM

## 2017-09-06 DIAGNOSIS — L97413 Non-pressure chronic ulcer of right heel and midfoot with necrosis of muscle: Secondary | ICD-10-CM

## 2017-09-06 DIAGNOSIS — Z515 Encounter for palliative care: Secondary | ICD-10-CM

## 2017-09-06 LAB — CULTURE, BLOOD (ROUTINE X 2)
CULTURE: NO GROWTH
Culture: NO GROWTH
SPECIAL REQUESTS: ADEQUATE
SPECIAL REQUESTS: ADEQUATE

## 2017-09-06 LAB — GLUCOSE, CAPILLARY
GLUCOSE-CAPILLARY: 127 mg/dL — AB (ref 65–99)
Glucose-Capillary: 142 mg/dL — ABNORMAL HIGH (ref 65–99)
Glucose-Capillary: 189 mg/dL — ABNORMAL HIGH (ref 65–99)
Glucose-Capillary: 223 mg/dL — ABNORMAL HIGH (ref 65–99)

## 2017-09-06 MED ORDER — GABAPENTIN 100 MG PO CAPS
100.0000 mg | ORAL_CAPSULE | Freq: Every day | ORAL | Status: DC
  Administered 2017-09-06 – 2017-09-07 (×2): 100 mg via ORAL
  Filled 2017-09-06 (×2): qty 1

## 2017-09-06 MED ORDER — SODIUM CHLORIDE 0.9 % IV SOLN
INTRAVENOUS | Status: DC
  Administered 2017-09-06: 14:00:00 via INTRAVENOUS

## 2017-09-06 NOTE — Progress Notes (Signed)
Patient ID: Maureen Taylor, female   DOB: [birth year], 82 y.o.   MRN: [id number]   ACP note.  Patient, significant other and daughter at the bedside.  Diagnosis: Osteomyelitis of the heel, bipolar disorder, protein calorie malnutrition, peripheral vascular disease, Parkinson's disease, obesity, immobility.  CODE STATUS discussed.  Patient would like to remain a full code at this point in time.  Patient and family have now agreed to amputation.  Dr. Alford Highlandichard Jaliana Medellin  Time spent on ACP note 17 minutes

## 2017-09-06 NOTE — Consult Note (Signed)
Consultation Note Date: 09/06/2017   Patient Name: Maureen Taylor  DOB: [birth year]  MRN: [id number]  Age / Sex: 82 y.o., female  PCP: Dorothey BasemanBronstein, David, MD Referring Physician: Alford HighlandWieting, Richard, MD  Reason for Consultation: Establishing goals of care  HPI/Patient Profile: Maureen Taylor  is a 82 y.o. female with a known history of Charcot's joint, GERD, HTn, DM, HLD, Obesity, neuropathy, Osteomyelitis on foot- treated with doxy for 14 days in Sept 2018- sent to [facility], cont to have repeated Inf- also was given Rocephin and Clindamycin for last few days, still no improvement, so sent to ER.    Clinical Assessment and Goals of Care: Patient is resting in bed. Since last hospitalization, she has resided at a facility. She has returned to hospital with osteomyelitis and has decided on amputation.   We discussed diagnosis, prognosis, and GOC.   Values and goals of care important to patient and family were attempted to be elicited. Patient is quiet and says she is unsure of her wishes.  She does state she would want [name], her daughter to make decisions for her if she is unable. Last admission, she requested this writer to call [name] to discuss her care.  Chaplain called for medical POA paperwork as [name] has durable POA.      Patient is Management consultantdecision maker, chaplain and notary working on POA at this time.     SUMMARY OF RECOMMENDATIONS   Patient quiet and and answers that she is unsure when attempting GOC conversation at this time, requests daughter Maureen Taylor as [place].   Code Status/Advance Care Planning:  Full code   Palliative Prophylaxis:   Aspiration and Oral Care   Psycho-social/Spiritual:  Desire for further Chaplaincy support: POA papers  Prognosis:   Unable to determine depending on infection.   Discharge Planning: To Be Determined      Primary Diagnoses: Present on Admission: **None**   I  have reviewed the medical record, interviewed the patient and family, and examined the patient. The following aspects are pertinent.  Past Medical History:  Diagnosis Date  . Acute encephalopathy   . Allergic rhinitis   . Ascorbic acid deficiency   . Bipolar affective disorder (HCC)   . Candidiasis   . Charcot's joint   . Chronic constipation   . Dysuria   . GERD (gastroesophageal reflux disease)   . History of falling   . HLD (hyperlipidemia)   . HTN (hypertension)   . Magnesium deficiency   . Major depressive disorder   . Mixed incontinence   . Morbid obesity (HCC)   . Muscle weakness   . Neuropathic pain   . Neuropathy   . OAB (overactive bladder)   . Obesity   . Oral pain   . Parkinson disease (HCC)   . Pneumonia   . Protein calorie malnutrition (HCC)   . Rosacea   . Sepsis (HCC) 09/2016  . Slow transit constipation   . Type 2 diabetes mellitus with diabetic polyneuropathy, with long-term current use of insulin (HCC)   .  Urinary tract infection without hematuria   . Vaginal atrophy   . Vitamin deficiency    Social History   Socioeconomic History  . Marital status: Widowed    Spouse name: None  . Number of children: None  . Years of education: None  . Highest education level: None  Social Needs  . Financial resource strain: None  . Food insecurity - worry: None  . Food insecurity - inability: None  . Transportation needs - medical: None  . Transportation needs - non-medical: None  Occupational History  . None  Tobacco Use  . Smoking status: Never Smoker  . Smokeless tobacco: Never Used  Substance and Sexual Activity  . Alcohol use: No    Alcohol/week: 0.0 oz  . Drug use: No  . Sexual activity: No  Other Topics Concern  . None  Social History Narrative  . None   Family History  Problem Relation Age of Onset  . Colon cancer Father   . Colon cancer Sister   . Stomach cancer Sister   . Rectal cancer Sister   . Kidney disease Neg Hx   . Bladder  Cancer Neg Hx   . Prostate cancer Neg Hx    Scheduled Meds: . ARIPiprazole  2.5 mg Oral BID  . aspirin EC  81 mg Oral Daily  . atorvastatin  20 mg Oral Daily  . budesonide (PULMICORT) nebulizer solution  0.5 mg Nebulization BID  . carbidopa-levodopa  1 tablet Oral BID  . Chlorhexidine Gluconate Cloth  6 each Topical Q0600  . docusate sodium  100 mg Oral BID  . DULoxetine  60 mg Oral Daily  . feeding supplement (ENSURE ENLIVE)  237 mL Oral BID BM  . folic acid  1 mg Oral Once per day on Sun Mon Tue Wed Thu Sat  . furosemide  40 mg Oral Daily  . Gerhardt's butt cream   Topical BID  . heparin  5,000 Units Subcutaneous Q8H  . insulin aspart  0-9 Units Subcutaneous TID WC  . insulin glargine  8 Units Subcutaneous QHS  . ipratropium-albuterol  3 mL Nebulization BID  . lamoTRIgine  100 mg Oral BID  . magnesium oxide  400 mg Oral Daily  . methotrexate  15 mg Oral Q Fri  . metroNIDAZOLE   Topical BID  . mupirocin ointment  1 application Nasal BID  . pantoprazole  40 mg Oral Daily  . polyethylene glycol  17 g Oral BID  . polyvinyl alcohol  1 drop Both Eyes TID  . vitamin C  250 mg Oral BID   Continuous Infusions: . sodium chloride    . ceFEPime (MAXIPIME) IV 2 g (09/06/17 1236)  . vancomycin Stopped (09/05/17 1326)   PRN Meds:.acetaminophen, alum & mag hydroxide-simeth, bisacodyl, ipratropium-albuterol, traMADol Medications Prior to Admission:  Prior to Admission medications   Medication Sig Start Date End Date Taking? Authorizing Provider  acetaminophen (TYLENOL) 500 MG tablet Take 1,000 mg by mouth every 8 (eight) hours as needed.   Yes [provider]  ARIPiprazole (ABILIFY) 5 MG tablet Take 2.5 mg by mouth 2 (two) times daily.    Yes [provider]  ascorbic acid (VITAMIN C) 250 MG tablet Take 250 mg by mouth 2 (two) times daily.   Yes [provider]  aspirin 81 MG EC tablet Take 81 mg by mouth daily. Swallow whole.   Yes [provider]    atorvastatin (LIPITOR) 20 MG tablet Take 20 mg by mouth daily.  Yes [provider]  carbidopa-levodopa (SINEMET IR) 25-100 MG tablet Take 1 tablet by mouth 2 (two) times daily.    Yes [provider]  carboxymethylcellulose 1 % ophthalmic solution Apply 1 drop to eye 3 (three) times daily.    Yes [provider]  Cranberry 425 MG CAPS Take 1 capsule by mouth daily.    Yes [provider]  docusate sodium (COLACE) 100 MG capsule Take 1 capsule (100 mg total) by mouth 2 (two) times daily. 06/24/17  Yes Altamese Dilling, MD  DULoxetine (CYMBALTA) 60 MG capsule Take 60 mg by mouth daily.   Yes [provider]  folic acid (FOLVITE) 1 MG tablet Take 1 mg by mouth. Saturday through Thursday   Yes [provider]  furosemide (LASIX) 40 MG tablet Take 1 tablet (40 mg total) by mouth daily. 07/12/17 07/12/18 Yes Rockne Menghini, MD  gabapentin (NEURONTIN) 100 MG capsule Take 200 mg by mouth at bedtime.    Yes [provider]  insulin glargine (LANTUS) 100 UNIT/ML injection Inject 0.35 mLs (35 Units total) into the skin 2 (two) times daily. Patient taking differently: Inject 25 Units into the skin daily.  06/24/17  Yes Altamese Dilling, MD  insulin lispro (HUMALOG) 100 UNIT/ML injection Inject 2-12 Units into the skin See admin instructions. Sliding Scale   Yes [provider]  lamoTRIgine (LAMICTAL) 100 MG tablet Take 100 mg by mouth 2 (two) times daily.    Yes [provider]  linaclotide (LINZESS) 145 MCG CAPS capsule Take 145 mcg by mouth daily before breakfast.   Yes [provider]  lisinopril (PRINIVIL,ZESTRIL) 20 MG tablet Take 20 mg by mouth daily.    Yes [provider]  Magnesium 400 MG CAPS Take 1 capsule by mouth daily.   Yes [provider]  methotrexate (RHEUMATREX) 2.5 MG tablet Take 15 mg by mouth every Friday.   Yes [provider]  metoprolol tartrate  (LOPRESSOR) 25 MG tablet Take 25 mg by mouth 2 (two) times daily. Take 25 mg by mouth twice a day for HTN   Yes [provider]  multivitamin-lutein (OCUVITE-LUTEIN) CAPS capsule Take 1 capsule by mouth daily.   Yes [provider]  pantoprazole (PROTONIX) 40 MG tablet Take 40 mg by mouth daily.    Yes [provider]  polyethylene glycol (MIRALAX / GLYCOLAX) packet Take 17 g by mouth 2 (two) times daily.   Yes [provider]  sennosides-docusate sodium (SENOKOT-S) 8.6-50 MG tablet Take 2 tablets by mouth 2 (two) times daily.   Yes [provider]  alum & mag hydroxide-simeth (MAALOX/MYLANTA) 200-200-20 MG/5ML suspension Take 30 mLs by mouth every 6 (six) hours as needed for indigestion or heartburn. 10/09/16   Hongalgi, Maximino Greenland, MD  bisacodyl (DULCOLAX) 10 MG suppository Place 1 suppository (10 mg total) rectally daily as needed for moderate constipation. 06/24/17   Altamese Dilling, MD  conjugated estrogens (PREMARIN) vaginal cream Place 1 Applicatorful vaginally every Monday, Wednesday, and Friday. Apply 0.5mg  (pea-sized amount)  just inside the vaginal introitus with a finger-tip every Monday, Wednesday and Friday nights. 10/10/16   Hongalgi, Maximino Greenland, MD  feeding supplement, ENSURE ENLIVE, (ENSURE ENLIVE) LIQD Take 237 mLs by mouth 2 (two) times daily between meals. 06/24/17   Altamese Dilling, MD  ipratropium-albuterol (DUONEB) 0.5-2.5 (3) MG/3ML SOLN Take 3 mLs by nebulization every 4 (four) hours as needed (wheezing). 06/24/17   Altamese Dilling, MD  predniSONE (STERAPRED UNI-PAK 21 TAB) 10 MG (21)  TBPK tablet Take 6 tabs first day, 5 tab on day 2, then 4 on day 3rd, 3 tabs on day 4th , 2 tab on day 5th, and 1 tab on 6th day. Patient not taking: Reported on 09/10/2017 06/24/17   Altamese DillingVachhani, Vaibhavkumar, MD   Allergies  Allergen Reactions  . Codeine Itching  . Morphine And Related Itching  . Indocin [Indomethacin] Rash  . Vancomycin Rash     Rash  . Zosyn [Piperacillin Sod-Tazobactam So] Rash   Review of Systems  All other systems reviewed and are negative.   Physical Exam  Constitutional: No distress.  HENT:  Head: Normocephalic.  Cardiovascular:  Warm and dry  Pulmonary/Chest: Effort normal.    Vital Signs: BP (!) 122/92 (BP Location: Right Arm)   Pulse 75   Temp (!) 97.5 F (36.4 C) (Oral)   Resp 18   Ht 5' (1.524 m)   Wt 88 kg (194 lb)   SpO2 98%   BMI 37.89 kg/m  Pain Assessment: CPOT POSS *See Group Information*: 2-Acceptable,Slightly drowsy, easily aroused Pain Score: Asleep   SpO2: SpO2: 98 % O2 Device:SpO2: 98 % O2 Flow Rate: .O2 Flow Rate (L/min): 2 L/min  IO: Intake/output summary:   Intake/Output Summary (Last 24 hours) at 09/06/2017 1400 Last data filed at 09/06/2017 0402 Gross per 24 hour  Intake 300 ml  Output 800 ml  Net -500 ml    LBM: Last BM Date: 09/05/17 Baseline Weight: Weight: 90.5 kg (199 lb 9.6 oz) Most recent weight: Weight: 88 kg (194 lb)     Palliative Assessment/Data: 40%     Time In: 1:25 Time Out: 2:15 Time Total: 50 min Greater than 50%  of this time was spent counseling and coordinating care related to the above assessment and plan.  Signed by: Morton Stallrystal Lina Hitch, NP   Please contact Palliative Medicine Team phone at [phone] for questions and concerns.  For individual provider: See Loretha StaplerAmion

## 2017-09-06 NOTE — Consult Note (Signed)
WOC Nurse wound follow up Wound type:Unstageable to right heel.  Family has agreed to proceed with BKA this week.  Has partial thickness pressure injury to left heel as well.  Both feet are in Prevalon offloading boots and patient is on mattress with low air loss feature.  Is being turned and repositioned every two hours. Albumin is low (3.0) and this was discussed with daughter and spouse at bedside.  Explained that protein intake was essential for wound healing.  Has Ensure supplementation ordered.  They agreed to encourage her to drink them. Measurement: let heel:  2 cm x 2 cm x 0.2 cm  Wound AOZ:HYQMbed:pink and moist Drainage (amount, consistency, odor) minimal serosanguinous Periwound:intact Dressing procedure/placement/frequency:Cleanse wound to left heel with NS and pat dry.  Apply Xeroform to wound bed.  Cover with Silicone border foam dressing. Change Monday/Wednesday/Friday. Will not follow at this time.  Please re-consult if needed.  Maple HudsonKaren Sisto Granillo RN BSN CWON Pager [phone]

## 2017-09-06 NOTE — Progress Notes (Signed)
[facility]  Daily Progress Note   Subjective  - 2 Days Post-Op  Patient is more awake and alert again today.  No events overnight.   Patient and family have decided to proceed with right AKA  Objective Vitals:   09/05/17 1747 09/05/17 1901 09/06/17 0358 09/06/17 0802  BP: (!) 84/46 (!) 101/51 129/61 (!) 122/92  Pulse: (!) 103 (!) 103 93 75  Resp:   19 18  Temp:   97.8 F (36.6 C) (!) 97.5 F (36.4 C)  TempSrc:   Oral Oral  SpO2: 90% 93% 98% 98%  Weight:      Height:        Intake/Output Summary (Last 24 hours) at 09/06/2017 1310 Last data filed at 09/06/2017 0402 Gross per 24 hour  Intake 300 ml  Output 800 ml  Net -500 ml    PULM  CTAB CV  RRR VASC  Right foot warm, 1+ pulse present in AT  Laboratory CBC    Component Value Date/Time   WBC 9.8 09/05/2017 0300   HGB 9.8 (L) 09/05/2017 0300   HGB 12.8 06/01/2014 0414   HCT 29.1 (L) 09/05/2017 0300   HCT 38.8 06/01/2014 0414   PLT 411 09/05/2017 0300   PLT 235 06/01/2014 0414    BMET    Component Value Date/Time   NA 140 09/05/2017 0300   NA 137 01/10/2017   NA 142 05/31/2014 0455   K 4.1 09/05/2017 0300   K 3.7 05/31/2014 0455   CL 103 09/05/2017 0300   CL 107 05/31/2014 0455   CO2 28 09/05/2017 0300   CO2 27 05/31/2014 0455   GLUCOSE 133 (H) 09/05/2017 0300   GLUCOSE 234 (H) 05/31/2014 0455   BUN 15 09/05/2017 0300   BUN 19 01/10/2017   BUN 12 05/31/2014 0455   CREATININE 0.82 09/05/2017 0300   CREATININE 1.09 05/31/2014 0455   CALCIUM 9.7 09/05/2017 0300   CALCIUM 8.4 (L) 05/31/2014 0455   GFRNONAA >60 09/05/2017 0300   GFRNONAA 49 (L) 05/31/2014 0455   GFRAA >60 09/05/2017 0300   GFRAA 57 (L) 05/31/2014 0455    Assessment/Planning: POD #2 s/p RLE revascularization   Right foot osteomyelitis is too extensive to salvage the foot at this point.  At high risk of contractures, and she and family have agreed to right AKA  Scheduled for tomorrow  Risks and  benefits have been discussed    Festus BarrenJason Gearld Kerstein  09/06/2017, 1:10 PM

## 2017-09-06 NOTE — Progress Notes (Signed)
CH responded to a PG for AD. CH educated the Pt and family. CH secured notary and two witnesses. CH made copies of original and placed in Pt chart. CH returned original to Daughter.    09/06/17 1500  Clinical Encounter Type  Visited With Patient;Patient and family together;Health care provider  Visit Type Initial;Spiritual support  Referral From Palliative care team  Consult/Referral To Chaplain  Spiritual Encounters  Spiritual Needs Literature;Emotional

## 2017-09-06 NOTE — Progress Notes (Signed)
Patient ID: Maureen Taylor, female   DOB: 1934-11-05, 82 y.o.   MRN: [id number]   [facility] PROGRESS NOTE  Maureen MustardJessie B Taylor AOZ:[id number] DOB: 1934-11-05 DOA: 09/25/2017 PCP: Dorothey BasemanBronstein, David, MD  HPI/Subjective: Patient feels okay.  Offers no complaints.  Some pain in the heel.  Objective: Vitals:   09/06/17 0358 09/06/17 0802  BP: 129/61 (!) 122/92  Pulse: 93 75  Resp: 19 18  Temp: 97.8 F (36.6 C) (!) 97.5 F (36.4 C)  SpO2: 98% 98%    Filed Weights   09/14/2017 1556 09/24/2017 1954 09/23/2017 1632  Weight: 90.5 kg (199 lb 9.6 oz) 88.2 kg (194 lb 6.4 oz) 88 kg (194 lb)    ROS: Review of Systems  Constitutional: Negative for chills and fever.  Respiratory: Positive for cough. Negative for shortness of breath.   Cardiovascular: Negative for chest pain.  Gastrointestinal: Negative for abdominal pain, constipation, diarrhea, nausea and vomiting.  Genitourinary: Negative for dysuria.  Musculoskeletal: Positive for joint pain.  Neurological: Negative for headaches.   Exam: Physical Exam  HENT:  Nose: No mucosal edema.  Mouth dry  Eyes: Conjunctivae and lids are normal. Pupils are equal, round, and reactive to light.  Neck: No JVD present. Carotid bruit is not present. No edema present. No thyroid mass and no thyromegaly present.  Cardiovascular: S1 normal and S2 normal. Exam reveals no gallop.  No murmur heard. Pulses:      Dorsalis pedis pulses are 2+ on the right side, and 2+ on the left side.  Respiratory: No respiratory distress. She has decreased breath sounds in the right lower field and the left lower field. She has no wheezes. She has no rhonchi. She has no rales.  GI: Soft. Bowel sounds are normal. There is no tenderness.  Musculoskeletal:       Right ankle: She exhibits swelling.       Left ankle: She exhibits swelling.  Lymphadenopathy:    She has no cervical adenopathy.  Neurological: She is alert.  Skin: Skin is warm. Nails show no clubbing.  Large  area of black discoloration right heel and now with foul-smelling drainage. Facial erythema much less. As per nursing staff, lots of skin tears and stage II areas on the buttock  Psychiatric: She has a normal mood and affect.      Data Reviewed: Basic Metabolic Panel: Recent Labs  Lab 09/13/2017 1731 09/02/17 0309 09/03/17 0321 08/28/2017 0440 09/05/17 0300  NA 140 140 142 141 140  K 5.3* 4.2 4.8 5.0 4.1  CL 99* 104 103 104 103  CO2 27 28 27 26 28   GLUCOSE 153* 102* 140* 118* 133*  BUN 35* 32* 28* 22* 15  CREATININE 1.48* 1.14* 1.02* 0.94 0.82  CALCIUM 10.2 9.5 10.0 10.2 9.7   Liver Function Tests: Recent Labs  Lab 08/30/2017 1731  AST 21  ALT <5*  ALKPHOS 132*  BILITOT 0.1*  PROT 7.8  ALBUMIN 3.0*   CBC: Recent Labs  Lab 09/16/2017 1731 09/02/17 0309 09/03/17 0321 09/05/17 0300  WBC 18.5* 13.7* 12.2* 9.8  NEUTROABS 16.4*  --   --   --   HGB 12.3 10.8* 11.1* 9.8*  HCT 37.7 32.6* 33.7* 29.1*  MCV 89.5 88.0 89.1 88.0  PLT 565* 493* 491* 411    CBG: Recent Labs  Lab 09/05/17 1716 09/05/17 2101 09/05/17 2307 09/06/17 0740 09/06/17 1158  GLUCAP 161* 187* 173* 127* 142*    Recent Results (from the past 240 hour(s))  Blood Culture (routine  x 2)     Status: None   Collection Time: September 25, 2017  5:37 PM  Result Value Ref Range Status   Specimen Description LEFT ANTECUBITAL  Final   Special Requests   Final    BOTTLES DRAWN AEROBIC AND ANAEROBIC Blood Culture adequate volume   Culture NO GROWTH 5 DAYS  Final   Report Status 09/06/2017 FINAL  Final  Blood Culture (routine x 2)     Status: None   Collection Time: 25-Sep-2017  6:31 PM  Result Value Ref Range Status   Specimen Description BLOOD LEFT ARM  Final   Special Requests   Final    BOTTLES DRAWN AEROBIC AND ANAEROBIC Blood Culture adequate volume   Culture NO GROWTH 5 DAYS  Final   Report Status 09/06/2017 FINAL  Final  MRSA PCR Screening     Status: Abnormal   Collection Time: Sep 25, 2017 10:10 PM  Result  Value Ref Range Status   MRSA by PCR POSITIVE (A) NEGATIVE Final    Comment:        The GeneXpert MRSA Assay (FDA approved for NASAL specimens only), is one component of a comprehensive MRSA colonization surveillance program. It is not intended to diagnose MRSA infection nor to guide or monitor treatment for MRSA infections. RESULT CALLED TO, READ BACK BY AND VERIFIED WITH: [facility] RAYMOND AT 0014 09/02/17.PMH      Studies: No results found.  Scheduled Meds: . ARIPiprazole  2.5 mg Oral BID  . aspirin EC  81 mg Oral Daily  . atorvastatin  20 mg Oral Daily  . budesonide (PULMICORT) nebulizer solution  0.5 mg Nebulization BID  . carbidopa-levodopa  1 tablet Oral BID  . Chlorhexidine Gluconate Cloth  6 each Topical Q0600  . docusate sodium  100 mg Oral BID  . DULoxetine  60 mg Oral Daily  . feeding supplement (ENSURE ENLIVE)  237 mL Oral BID BM  . folic acid  1 mg Oral Once per day on Sun Mon Tue Wed Thu Sat  . furosemide  40 mg Oral Daily  . Gerhardt's butt cream   Topical BID  . heparin  5,000 Units Subcutaneous Q8H  . insulin aspart  0-9 Units Subcutaneous TID WC  . ipratropium-albuterol  3 mL Nebulization BID  . lamoTRIgine  100 mg Oral BID  . magnesium oxide  400 mg Oral Daily  . methotrexate  15 mg Oral Q Fri  . metroNIDAZOLE   Topical BID  . mupirocin ointment  1 application Nasal BID  . pantoprazole  40 mg Oral Daily  . polyethylene glycol  17 g Oral BID  . polyvinyl alcohol  1 drop Both Eyes TID  . vitamin C  250 mg Oral BID   Continuous Infusions: . sodium chloride 20 mL/hr at 09/06/17 1409  . ceFEPime (MAXIPIME) IV Stopped (09/06/17 1409)  . vancomycin 1,250 mg (09/06/17 1408)    Assessment/Plan:  1. Acute on chronic osteomyelitis.  Continue vancomycin at a slower infusion rate and cefepime.  Patient and family now agreeable to surgical procedure by Dr. do for tomorrow.  Will keep n.p.o. after midnight. 2. Type 2 diabetes mellitus.  Hold Lantus tonight with  surgery tomorrow.  Sliding scale ordered. 3. Diabetic neuropathy.  Can restart gabapentin 4. Relative hypotension.   antihypertensive still on hold. 5. Hyperlipidemia unspecified on statin 6. Parkinson's on Sinemet 7. Bipolar disorder continue psychiatric medication  Code Status:     Code Status Orders  (From admission, onward)  Start     Ordered   09/10/2017 1948  Full code  Continuous     09/03/2017 1947    Code Status History    Date Active Date Inactive Code Status Order ID Comments User Context   06/17/2017 15:52 06/24/2017 19:37 Full Code [id number]  Marguarite ArbourSparks, Jeffrey D, MD Inpatient   10/05/2016 12:17 10/09/2016 18:37 Full Code [id number]  Gwenyth BenderBlack, Karen M, NP ED   08/24/2016 20:19 08/27/2016 19:12 Full Code [id number]  Narda BondsNettey, Ralph A, MD Inpatient    Advance Directive Documentation     Most Recent Value  Type of Advance Directive  Out of facility DNR (pink MOST or yellow form)  Pre-existing out of facility DNR order (yellow form or pink MOST form)  Pink MOST form placed in chart (order not valid for inpatient use)  "MOST" Form in Place?  No data     Family Communication: Case discussed with family at the bedside Disposition Plan: We will be in the hospital few days after amputation  Consultants:  Podiatry  Vascular surgery  Antibiotics:  Clindamycin  Levaquin  Time spent: 37 minutes including ACP time  [employer] Corporationichard Patrisia Faeth  [facility]

## 2017-09-07 ENCOUNTER — Inpatient Hospital Stay: Payer: Medicare Other | Admitting: Registered Nurse

## 2017-09-07 ENCOUNTER — Encounter: Admission: EM | Disposition: E | Payer: Self-pay | Source: Home / Self Care | Attending: Internal Medicine

## 2017-09-07 DIAGNOSIS — I70261 Atherosclerosis of native arteries of extremities with gangrene, right leg: Secondary | ICD-10-CM

## 2017-09-07 HISTORY — PX: AMPUTATION: SHX166

## 2017-09-07 LAB — GLUCOSE, CAPILLARY
GLUCOSE-CAPILLARY: 117 mg/dL — AB (ref 65–99)
GLUCOSE-CAPILLARY: 165 mg/dL — AB (ref 65–99)
Glucose-Capillary: 130 mg/dL — ABNORMAL HIGH (ref 65–99)
Glucose-Capillary: 136 mg/dL — ABNORMAL HIGH (ref 65–99)
Glucose-Capillary: 129 mg/dL — ABNORMAL HIGH (ref 65–99)

## 2017-09-07 SURGERY — AMPUTATION, ABOVE KNEE
Anesthesia: General | Laterality: Right

## 2017-09-07 MED ORDER — HYDROMORPHONE HCL 1 MG/ML IJ SOLN
0.5000 mg | INTRAMUSCULAR | Status: DC | PRN
  Administered 2017-09-07 – 2017-09-08 (×3): 0.5 mg via INTRAVENOUS
  Filled 2017-09-07 (×2): qty 1

## 2017-09-07 MED ORDER — FENTANYL CITRATE (PF) 100 MCG/2ML IJ SOLN
INTRAMUSCULAR | Status: AC
  Filled 2017-09-07: qty 2

## 2017-09-07 MED ORDER — ONDANSETRON HCL 4 MG/2ML IJ SOLN
INTRAMUSCULAR | Status: DC | PRN
  Administered 2017-09-07: 4 mg via INTRAVENOUS

## 2017-09-07 MED ORDER — HYDROMORPHONE HCL 1 MG/ML IJ SOLN
INTRAMUSCULAR | Status: AC
  Administered 2017-09-07: 0.5 mg via INTRAVENOUS
  Filled 2017-09-07: qty 1

## 2017-09-07 MED ORDER — LIDOCAINE HCL (CARDIAC) 20 MG/ML IV SOLN
INTRAVENOUS | Status: DC | PRN
  Administered 2017-09-07: 100 mg via INTRAVENOUS

## 2017-09-07 MED ORDER — LIDOCAINE HCL (PF) 2 % IJ SOLN
INTRAMUSCULAR | Status: AC
  Filled 2017-09-07: qty 10

## 2017-09-07 MED ORDER — SODIUM CHLORIDE 0.9 % IV SOLN
INTRAVENOUS | Status: DC
  Administered 2017-09-07 (×3): via INTRAVENOUS

## 2017-09-07 MED ORDER — FENTANYL CITRATE (PF) 100 MCG/2ML IJ SOLN
25.0000 ug | INTRAMUSCULAR | Status: DC | PRN
  Administered 2017-09-07 (×4): 25 ug via INTRAVENOUS

## 2017-09-07 MED ORDER — OXYCODONE HCL 5 MG PO TABS
10.0000 mg | ORAL_TABLET | ORAL | Status: DC | PRN
  Administered 2017-09-07 – 2017-09-08 (×2): 10 mg via ORAL
  Filled 2017-09-07 (×2): qty 2

## 2017-09-07 MED ORDER — ONDANSETRON HCL 4 MG/2ML IJ SOLN: 4.0000 mg | Freq: Once | INTRAMUSCULAR | Status: DC | PRN

## 2017-09-07 MED ORDER — PROPOFOL 10 MG/ML IV BOLUS
INTRAVENOUS | Status: AC
  Filled 2017-09-07: qty 20

## 2017-09-07 MED ORDER — ONDANSETRON HCL 4 MG/2ML IJ SOLN
INTRAMUSCULAR | Status: AC
  Filled 2017-09-07: qty 2

## 2017-09-07 MED ORDER — FENTANYL CITRATE (PF) 100 MCG/2ML IJ SOLN
INTRAMUSCULAR | Status: DC | PRN
  Administered 2017-09-07: 75 ug via INTRAVENOUS
  Administered 2017-09-07 (×2): 50 ug via INTRAVENOUS
  Administered 2017-09-07: 25 ug via INTRAVENOUS

## 2017-09-07 MED ORDER — PROPOFOL 10 MG/ML IV BOLUS
INTRAVENOUS | Status: DC | PRN
  Administered 2017-09-07: 50 mg via INTRAVENOUS
  Administered 2017-09-07: 100 mg via INTRAVENOUS

## 2017-09-07 MED ORDER — SEVOFLURANE IN SOLN
RESPIRATORY_TRACT | Status: AC
  Filled 2017-09-07: qty 250

## 2017-09-07 MED ORDER — FENTANYL CITRATE (PF) 100 MCG/2ML IJ SOLN
INTRAMUSCULAR | Status: AC
  Administered 2017-09-07: 25 ug via INTRAVENOUS
  Filled 2017-09-07: qty 2

## 2017-09-07 SURGICAL SUPPLY — 36 items
BANDAGE ELASTIC 6 LF NS (GAUZE/BANDAGES/DRESSINGS) ×3 IMPLANT
BLADE SAGITTAL WIDE XTHICK NO (BLADE) ×3 IMPLANT
BLADE SAW GIGLI 510 (BLADE) ×2 IMPLANT
BLADE SAW GIGLI 510MM (BLADE) ×1
BNDG CMPR MED 5X6 ELC HKLP NS (GAUZE/BANDAGES/DRESSINGS) ×1
BNDG COHESIVE 4X5 TAN STRL (GAUZE/BANDAGES/DRESSINGS) ×3 IMPLANT
BNDG GAUZE 4.5X4.1 6PLY STRL (MISCELLANEOUS) ×6 IMPLANT
CANISTER SUCT 1200ML W/VALVE (MISCELLANEOUS) ×3 IMPLANT
DRAPE INCISE IOBAN 66X45 STRL (DRAPES) ×3 IMPLANT
DRAPE INCISE IOBAN 66X60 STRL (DRAPES) ×3 IMPLANT
DURAPREP 26ML APPLICATOR (WOUND CARE) ×3 IMPLANT
ELECT CAUTERY BLADE 6.4 (BLADE) ×3 IMPLANT
ELECT REM PT RETURN 9FT ADLT (ELECTROSURGICAL) ×3
ELECTRODE REM PT RTRN 9FT ADLT (ELECTROSURGICAL) ×1 IMPLANT
GAUZE PETRO XEROFOAM 1X8 (MISCELLANEOUS) ×6 IMPLANT
GLOVE BIO SURGEON STRL SZ7 (GLOVE) ×6 IMPLANT
GLOVE INDICATOR 7.5 STRL GRN (GLOVE) ×3 IMPLANT
GOWN STRL REUS W/ TWL LRG LVL3 (GOWN DISPOSABLE) ×3 IMPLANT
GOWN STRL REUS W/ TWL XL LVL3 (GOWN DISPOSABLE) ×1 IMPLANT
GOWN STRL REUS W/TWL LRG LVL3 (GOWN DISPOSABLE) ×9
GOWN STRL REUS W/TWL XL LVL3 (GOWN DISPOSABLE) ×3
HANDLE YANKAUER SUCT BULB TIP (MISCELLANEOUS) ×3 IMPLANT
KIT RM TURNOVER STRD PROC AR (KITS) ×3 IMPLANT
LABEL OR SOLS (LABEL) ×3 IMPLANT
NS IRRIG 1000ML POUR BTL (IV SOLUTION) ×3 IMPLANT
PACK EXTREMITY ARMC (MISCELLANEOUS) ×3 IMPLANT
PAD ABD DERMACEA PRESS 5X9 (GAUZE/BANDAGES/DRESSINGS) ×6 IMPLANT
PAD PREP 24X41 OB/GYN DISP (PERSONAL CARE ITEMS) ×3 IMPLANT
SPONGE LAP 18X18 5 PK (GAUZE/BANDAGES/DRESSINGS) ×3 IMPLANT
STAPLER SKIN PROX 35W (STAPLE) ×3 IMPLANT
STOCKINETTE M/LG 89821 (MISCELLANEOUS) ×3 IMPLANT
SUT SILK 2 0 (SUTURE) ×3
SUT SILK 2 0 SH (SUTURE) ×6 IMPLANT
SUT SILK 2-0 18XBRD TIE 12 (SUTURE) ×1 IMPLANT
SUT VIC AB 0 CT1 36 (SUTURE) ×6 IMPLANT
SUT VIC AB 2-0 CT1 (SUTURE) ×6 IMPLANT

## 2017-09-07 NOTE — Progress Notes (Signed)
Pt has been NPO since 0000, Pts morning BG 130, per MD Wieting hold sliding scale insulin 0800 and 1200.

## 2017-09-07 NOTE — Progress Notes (Signed)
Patient return from surgery s/p R AKA screaming in pain. PRN tramadol (only PRN on file) administered with no relief. On call Surgeon called, awaiting call back. On call hospitalist calls, Dr. Imogene Burnhen, who suggest to wait for Surgeon to call back. Patient is in sever pain and crying out. Family at bedside and is very frustrated that patient is not getting adequate pain relief after a major surgery statin, " Dr. Wyn Quakerew told us she was going to be in a lot of pain and she would have good pain medications".  RN at bedside awaiting call back with family. Will continue to monitor.

## 2017-09-07 NOTE — H&P (Signed)
 [facility] History & Physical Update  The patient was interviewed and re-examined.  The patient's previous History and Physical has been reviewed and is unchanged.  There is no change in the plan of care. We plan to proceed with the scheduled procedure.  Festus BarrenJason Dew, MD  09/23/2017, 3:46 PM

## 2017-09-07 NOTE — Op Note (Signed)
  [facility]   OPERATIVE NOTE   PROCEDURE:  Right above-the-knee amputation  PRE-OPERATIVE DIAGNOSIS: Right foot gangrene  POST-OPERATIVE DIAGNOSIS: same as above  SURGEON:  Festus BarrenJason Alder Murri, MD  ASSISTANT(S): Raul DelKim Stegmayer, PA-C  ANESTHESIA: general  ESTIMATED BLOOD LOSS: 250 cc  FINDING(S): none  SPECIMEN(S):  Right above-the-knee amputation  INDICATIONS:   Maureen MustardJessie B Seebeck is a 82 y.o. female who presents with right foot osteomyelitis too severe to salvage the foot.  The patient is scheduled for a right above-the-knee amputation.  I discussed in depth with the patient the risks, benefits, and alternatives to this procedure.  The patient is aware that the risk of this operation included but are not limited to:  bleeding, infection, myocardial infarction, stroke, death, failure to heal amputation wound, and possible need for more proximal amputation.  The patient is aware of the risks and agrees proceed forward with the procedure.  DESCRIPTION: After full informed written consent was obtained from the patient, the patient was taken to the operating room, and placed supine upon the operating table.  Prior to induction, the patient received IV antibiotics.  The patient was then prepped and draped in the standard fashion for a right above-the-knee amputation.  After obtaining adequate anesthesia, the patient was prepped and draped in the standard fashion for a above-the-knee amputation.  I marked out the anterior and posterior flaps for a fish-mouth type of amputation.  I made the incisions for these flaps, and then dissected through the subcutaneous tissue, fascia, and muscles circumferentially.  I elevated  the periosteal tissue 4-5 cm more proximal than the anterior skin flap.  I then transected the femur with a power saw at this level.  Then I smoothed out the rough edges of the bone.  At this point, the specimen was passed off the field as the above-the-knee  amputation.  At this point, I clamped all visibly bleeding arteries and veins using a combination of suture ligation with silk suture and electrocautery.   Bleeding continued to be controlled with electrocautery and suture ligature.  The stump was washed off with sterile normal saline and no further active bleeding was noted.  I reapproximated the anterior and posterior fascia  with interrupted stitches of 0 Vicryl.  This was completed along the entire length of anterior and posterior fascia until there were no more loose space in the fascial line. The subcutaneous tissue was then approximated with 2-0 vicryl sutures. The skin was then  reapproximated with staples.  The stump was washed off and dried.  The incision was dressed with Xeroform and ABD pads, and  then fluffs were applied.  Kerlix was wrapped around the leg and then gently an ACE wrap was applied.  A large Ioban was then placed over the ACE wrap to secure the dressing. The patient was then awakened and take to the recovery room in stable condition.   COMPLICATIONS: none  CONDITION: stable  Festus BarrenJason Eashan Schipani  2016/12/23, 5:55 PM   This note was created with Dragon Medical transcription system. Any errors in dictation are purely unintentional.

## 2017-09-07 NOTE — Transfer of Care (Signed)
Immediate Anesthesia Transfer of Care Note  Patient: Nadara MustardJessie B Mcconaughy  Procedure(s) Performed: AMPUTATION ABOVE KNEE (Right )  Patient Location: PACU  Anesthesia Type:General  Level of Consciousness: sedated  Airway & Oxygen Therapy: Patient Spontanous Breathing and Patient connected to face mask oxygen  Post-op Assessment: Report given to RN and Post -op Vital signs reviewed and stable  Post vital signs: Reviewed and stable  Last Vitals:  Vitals:   09/18/2017 1502 09/20/2017 1805  BP: (!) 145/72 100/61  Pulse: 97 (!) 106  Resp:  13  Temp: 36.5 C 37.2 C  SpO2: 100% 100%    Last Pain:  Vitals:   08/30/2017 1805  TempSrc:   PainSc: Asleep         Complications: No apparent anesthesia complications

## 2017-09-07 NOTE — Anesthesia Procedure Notes (Signed)
Procedure Name: LMA Insertion Date/Time: 14-Jul-2017 4:56 PM Performed by: Ginger CarneMichelet, Stacia Feazell, CRNA Pre-anesthesia Checklist: Patient identified, Emergency Drugs available, Suction available, Patient being monitored and Timeout performed Patient Re-evaluated:Patient Re-evaluated prior to induction Oxygen Delivery Method: Circle system utilized Preoxygenation: Pre-oxygenation with 100% oxygen Induction Type: IV induction LMA: LMA inserted LMA Size: 4.0 Tube type: Oral Number of attempts: 2 Placement Confirmation: positive ETCO2 and breath sounds checked- equal and bilateral Tube secured with: Tape Dental Injury: Teeth and Oropharynx as per pre-operative assessment

## 2017-09-07 NOTE — Anesthesia Postprocedure Evaluation (Signed)
Anesthesia Post Note  Patient: Maureen Taylor  Procedure(s) Performed: AMPUTATION ABOVE KNEE (Right )  Patient location during evaluation: PACU Anesthesia Type: General Level of consciousness: awake Pain management: pain level controlled Vital Signs Assessment: post-procedure vital signs reviewed and stable Respiratory status: spontaneous breathing, nonlabored ventilation, respiratory function stable and patient connected to nasal cannula oxygen Cardiovascular status: blood pressure returned to baseline and stable Postop Assessment: no apparent nausea or vomiting Anesthetic complications: no     Last Vitals:  Vitals:   02/23/17 1954 02/23/17 2031  BP: 131/69 131/75  Pulse: 99 (!) 117  Resp: 18   Temp: 36.6 C 36.6 C  SpO2: 100% 99%    Last Pain:  Vitals:   02/23/17 2031  TempSrc: Oral  PainSc:                  Lenard SimmerAndrew Evans Levee

## 2017-09-07 NOTE — Progress Notes (Signed)
Patient ID: Maureen Taylor, female   DOB: March 04, 1935, 82 y.o.   MRN: [id number]  Patient ID: Maureen Taylor, female   DOB: March 04, 1935, 82 y.o.   MRN: [id number]   [facility] PROGRESS NOTE  Maureen MustardJessie B Taylor ONG:[id number] DOB: March 04, 1935 DOA: 08/29/2017 PCP: Maureen Taylor  HPI/Subjective: Patient feels okay.  Offers no complaints.  Some pain in the heel.  Objective: Vitals:   09/06/17 2041 09/11/2017 0512  BP:  119/60  Pulse: 96 96  Resp: 18   Temp:  97.7 F (36.5 C)  SpO2: 98% 97%    Filed Weights   2016/11/07 1556 2016/11/07 1954 09/03/2017 1632  Weight: 90.5 kg (199 lb 9.6 oz) 88.2 kg (194 lb 6.4 oz) 88 kg (194 lb)    ROS: Review of Systems  Constitutional: Negative for chills and fever.  Respiratory: Positive for cough. Negative for shortness of breath.   Cardiovascular: Negative for chest pain.  Gastrointestinal: Negative for abdominal pain, constipation, diarrhea, nausea and vomiting.  Genitourinary: Negative for dysuria.  Musculoskeletal: Positive for joint pain.  Neurological: Negative for headaches.   Exam: Physical Exam  HENT:  Nose: No mucosal edema.  Mouth dry  Eyes: Conjunctivae and lids are normal. Pupils are equal, round, and reactive to light.  Neck: No JVD present. Carotid bruit is not present. No edema present. No thyroid mass and no thyromegaly present.  Cardiovascular: S1 normal and S2 normal. Exam reveals no gallop.  No murmur heard. Pulses:      Dorsalis pedis pulses are 2+ on the right side, and 2+ on the left side.  Respiratory: No respiratory distress. She has decreased breath sounds in the right lower field and the left lower field. She has no wheezes. She has no rhonchi. She has no rales.  GI: Soft. Bowel sounds are normal. There is no tenderness.  Musculoskeletal:       Right ankle: She exhibits swelling.       Left ankle: She exhibits swelling.  Lymphadenopathy:    She has no cervical adenopathy.  Neurological: She is alert.   Skin: Skin is warm. Nails show no clubbing.  Large area of black discoloration right heel and now with foul-smelling drainage. Facial erythema much less. As per nursing staff, lots of skin tears and stage II areas on the buttock  Psychiatric: She has a normal mood and affect.      Data Reviewed: Basic Metabolic Panel: Recent Labs  Lab 2016/11/07 1731 09/02/17 0309 09/03/17 0321 08/30/2017 0440 09/05/17 0300  NA 140 140 142 141 140  K 5.3* 4.2 4.8 5.0 4.1  CL 99* 104 103 104 103  CO2 27 28 27 26 28   GLUCOSE 153* 102* 140* 118* 133*  BUN 35* 32* 28* 22* 15  CREATININE 1.48* 1.14* 1.02* 0.94 0.82  CALCIUM 10.2 9.5 10.0 10.2 9.7   Liver Function Tests: Recent Labs  Lab 2016/11/07 1731  AST 21  ALT <5*  ALKPHOS 132*  BILITOT 0.1*  PROT 7.8  ALBUMIN 3.0*   CBC: Recent Labs  Lab 2016/11/07 1731 09/02/17 0309 09/03/17 0321 09/05/17 0300  WBC 18.5* 13.7* 12.2* 9.8  NEUTROABS 16.4*  --   --   --   HGB 12.3 10.8* 11.1* 9.8*  HCT 37.7 32.6* 33.7* 29.1*  MCV 89.5 88.0 89.1 88.0  PLT 565* 493* 491* 411    CBG: Recent Labs  Lab 09/06/17 0740 09/06/17 1158 09/06/17 1618 09/06/17 2116 09/09/2017 0811  GLUCAP 127* 142* 189* 223* 130*  Recent Results (from the past 240 hour(s))  Blood Culture (routine x 2)     Status: None   Collection Time: 09/11/2017  5:37 PM  Result Value Ref Range Status   Specimen Description LEFT ANTECUBITAL  Final   Special Requests   Final    BOTTLES DRAWN AEROBIC AND ANAEROBIC Blood Culture adequate volume   Culture NO GROWTH 5 DAYS  Final   Report Status 09/06/2017 FINAL  Final  Blood Culture (routine x 2)     Status: None   Collection Time: 09/24/2017  6:31 PM  Result Value Ref Range Status   Specimen Description BLOOD LEFT ARM  Final   Special Requests   Final    BOTTLES DRAWN AEROBIC AND ANAEROBIC Blood Culture adequate volume   Culture NO GROWTH 5 DAYS  Final   Report Status 09/06/2017 FINAL  Final  MRSA PCR Screening     Status:  Abnormal   Collection Time: 09/20/2017 10:10 PM  Result Value Ref Range Status   MRSA by PCR POSITIVE (A) NEGATIVE Final    Comment:        The GeneXpert MRSA Assay (FDA approved for NASAL specimens only), is one component of a comprehensive MRSA colonization surveillance program. It is not intended to diagnose MRSA infection nor to guide or monitor treatment for MRSA infections. RESULT CALLED TO, READ BACK BY AND VERIFIED WITH: [facility] HospitalMARY Taylor AT 0014 09/02/17.PMH      Studies: No results found.  Scheduled Meds: . ARIPiprazole  2.5 mg Oral BID  . aspirin EC  81 mg Oral Daily  . atorvastatin  20 mg Oral Daily  . budesonide (PULMICORT) nebulizer solution  0.5 mg Nebulization BID  . carbidopa-levodopa  1 tablet Oral BID  . docusate sodium  100 mg Oral BID  . DULoxetine  60 mg Oral Daily  . feeding supplement (ENSURE ENLIVE)  237 mL Oral BID BM  . folic acid  1 mg Oral Once per day on Sun Mon Tue Wed Thu Sat  . furosemide  40 mg Oral Daily  . gabapentin  100 mg Oral QHS  . Gerhardt's butt cream   Topical BID  . heparin  5,000 Units Subcutaneous Q8H  . insulin aspart  0-9 Units Subcutaneous TID WC  . ipratropium-albuterol  3 mL Nebulization BID  . lamoTRIgine  100 mg Oral BID  . magnesium oxide  400 mg Oral Daily  . methotrexate  15 mg Oral Q Fri  . metroNIDAZOLE   Topical BID  . pantoprazole  40 mg Oral Daily  . polyethylene glycol  17 g Oral BID  . polyvinyl alcohol  1 drop Both Eyes TID  . vitamin C  250 mg Oral BID   Continuous Infusions: . sodium chloride    . ceFEPime (MAXIPIME) IV Stopped (09/06/17 2200)  . vancomycin 1,250 mg (09/06/17 1408)    Assessment/Plan:  1. Preop for amputation.  Surgery must be done to prevent sepsis and death.  EKG ordered.  Asked nursing staff not to cover with sliding scale at this point since will be n.p.o. all day.  Please check sugar prior to procedure to make sure she is not low.   2. Acute on chronic osteomyelitis.  Continue  vancomycin at a slower infusion rate and cefepime.  Hopefully will be able to stop antibiotics soon after surgery. 3. Type 2 diabetes mellitus.  Watch sugars closely while n.p.o. for procedure.  Lantus held last night. 4. Diabetic neuropathy.  On gabapentin. 5. Relative hypotension.  antihypertensive still on hold. 6. Hyperlipidemia unspecified on statin 7. Parkinson's on Sinemet 8. Bipolar disorder continue psychiatric medication  Code Status:     Code Status Orders  (From admission, onward)        Start     Ordered   09/08/2017 1948  Full code  Continuous     09/12/2017 1947    Code Status History    Date Active Date Inactive Code Status Order ID Comments User Context   06/17/2017 15:52 06/24/2017 19:37 Full Code [id number]  Marguarite Arbour, Taylor Inpatient   10/05/2016 12:17 10/09/2016 18:37 Full Code [id number]  Gwenyth Bender, NP ED   08/24/2016 20:19 08/27/2016 19:12 Full Code [id number]  Narda Bonds, Taylor Inpatient    Advance Directive Documentation     Most Recent Value  Type of Advance Directive  Out of facility DNR (pink MOST or yellow form)  Pre-existing out of facility DNR order (yellow form or pink MOST form)  Pink MOST form placed in chart (order not valid for inpatient use)  "MOST" Form in Place?  No data     Family Communication: Case discussed with significant other at bedside Disposition Plan: We will be in the hospital few days after amputation  Consultants:  Podiatry  Vascular surgery  Antibiotics:  Vancomycin  Cefepime  Time spent: 20 minutes  Aidenjames Heckmann [employer]

## 2017-09-07 NOTE — Progress Notes (Signed)
Spoke to Dr. Gilda CreaseSchnier new orders received for Dialuid IV and oxycodeone PRN. Paitent is not resting. Family exited the facility. Will continue to monitor.

## 2017-09-07 NOTE — Anesthesia Preprocedure Evaluation (Addendum)
Anesthesia Evaluation  Patient identified by MRN, date of birth, ID band Patient awake    Reviewed: Allergy & Precautions, H&P , NPO status , Patient's Chart, lab work & pertinent test results, reviewed documented beta blocker date and time   History of Anesthesia Complications Negative for: history of anesthetic complications  Airway Mallampati: III  TM Distance: >3 FB Neck ROM: full    Dental  (+) Missing, Poor Dentition, Dental Advidsory Given   Pulmonary shortness of breath and Long-Term Oxygen Therapy, asthma , sleep apnea and Oxygen sleep apnea , neg COPD, neg recent URI,           Cardiovascular Exercise Tolerance: Good hypertension, (-) angina(-) CAD, (-) Past MI, (-) Cardiac Stents and (-) CABG (-) dysrhythmias (-) Valvular Problems/Murmurs     Neuro/Psych neg Seizures PSYCHIATRIC DISORDERS Depression Bipolar Disorder  Neuromuscular disease (Parkinson's) negative psych ROS   GI/Hepatic Neg liver ROS, GERD  ,  Endo/Other  diabetes, Type 2  Renal/GU   negative genitourinary   Musculoskeletal   Abdominal   Peds  Hematology negative hematology ROS (+)   Anesthesia Other Findings Past Medical History: No date: Acute encephalopathy No date: Allergic rhinitis No date: Ascorbic acid deficiency No date: Bipolar affective disorder (HCC) No date: Candidiasis No date: Charcot's joint No date: Chronic constipation No date: Dysuria No date: GERD (gastroesophageal reflux disease) No date: History of falling No date: HLD (hyperlipidemia) No date: HTN (hypertension) No date: Magnesium deficiency No date: Major depressive disorder No date: Mixed incontinence No date: Morbid obesity (HCC) No date: Muscle weakness No date: Neuropathic pain No date: Neuropathy No date: OAB (overactive bladder) No date: Obesity No date: Oral pain No date: Parkinson disease (HCC) No date: Pneumonia No date: Protein calorie  malnutrition (HCC) No date: Rosacea 09/2016: Sepsis (HCC) No date: Slow transit constipation No date: Type 2 diabetes mellitus with diabetic polyneuropathy, with  long-term current use of insulin (HCC) No date: Urinary tract infection without hematuria No date: Vaginal atrophy No date: Vitamin deficiency   Reproductive/Obstetrics negative OB ROS                            Anesthesia Physical Anesthesia Plan  ASA: III  Anesthesia Plan: General   Post-op Pain Management:    Induction: Intravenous  PONV Risk Score and Plan: 3 and Ondansetron and Dexamethasone  Airway Management Planned: LMA  Additional Equipment:   Intra-op Plan:   Post-operative Plan: Extubation in OR  Informed Consent: I have reviewed the patients History and Physical, chart, labs and discussed the procedure including the risks, benefits and alternatives for the proposed anesthesia with the patient or authorized representative who has indicated his/her understanding and acceptance.   Dental Advisory Given  Plan Discussed with: Anesthesiologist, CRNA and Surgeon  Anesthesia Plan Comments:         Anesthesia Quick Evaluation

## 2017-09-07 NOTE — Anesthesia Post-op Follow-up Note (Signed)
Anesthesia QCDR form completed.        

## 2017-09-08 ENCOUNTER — Encounter (INDEPENDENT_AMBULATORY_CARE_PROVIDER_SITE_OTHER): Payer: Medicare Other | Admitting: Vascular Surgery

## 2017-09-08 ENCOUNTER — Encounter: Payer: Self-pay | Admitting: Vascular Surgery

## 2017-09-08 LAB — BASIC METABOLIC PANEL
Anion gap: 7 (ref 5–15)
BUN: 13 mg/dL (ref 6–20)
CHLORIDE: 109 mmol/L (ref 101–111)
CO2: 25 mmol/L (ref 22–32)
Calcium: 9 mg/dL (ref 8.9–10.3)
Creatinine, Ser: 0.69 mg/dL (ref 0.44–1.00)
GFR calc Af Amer: >60 mL/min (ref >60)
GFR calc non Af Amer: >60 mL/min (ref >60)
Glucose, Bld: 168 mg/dL — ABNORMAL HIGH (ref 65–99)
POTASSIUM: 4.1 mmol/L (ref 3.5–5.1)
SODIUM: 141 mmol/L (ref 135–145)

## 2017-09-08 LAB — GLUCOSE, CAPILLARY
GLUCOSE-CAPILLARY: 163 mg/dL — AB (ref 65–99)
Glucose-Capillary: 153 mg/dL — ABNORMAL HIGH (ref 65–99)
Glucose-Capillary: 136 mg/dL — ABNORMAL HIGH (ref 65–99)
Glucose-Capillary: 121 mg/dL — ABNORMAL HIGH (ref 65–99)

## 2017-09-08 LAB — CBC
HEMATOCRIT: 26.9 % — AB (ref 35.0–47.0)
HEMOGLOBIN: 8.7 g/dL — AB (ref 12.0–16.0)
MCH: 28.6 pg (ref 26.0–34.0)
MCHC: 32.4 g/dL (ref 32.0–36.0)
MCV: 88.3 fL (ref 80.0–100.0)
Platelets: 323 10*3/uL (ref 150–440)
RBC: 3.05 MIL/uL — AB (ref 3.80–5.20)
RDW: 15.4 % — ABNORMAL HIGH (ref 11.5–14.5)
WBC: 15.2 10*3/uL — ABNORMAL HIGH (ref 3.6–11.0)

## 2017-09-08 MED ORDER — HYDROMORPHONE HCL 1 MG/ML IJ SOLN
0.5000 mg | INTRAMUSCULAR | Status: DC | PRN
  Administered 2017-09-09: 0.5 mg via INTRAVENOUS
  Filled 2017-09-08: qty 1

## 2017-09-08 MED ORDER — ACETAMINOPHEN 500 MG PO TABS
1000.0000 mg | ORAL_TABLET | Freq: Four times a day (QID) | ORAL | Status: DC
  Administered 2017-09-08: 1000 mg via ORAL
  Filled 2017-09-08 (×4): qty 2

## 2017-09-08 MED ORDER — CYCLOBENZAPRINE HCL 5 MG PO TABS
5.0000 mg | ORAL_TABLET | Freq: Three times a day (TID) | ORAL | Status: DC | PRN
  Filled 2017-09-08 (×2): qty 1

## 2017-09-08 MED ORDER — OXYCODONE-ACETAMINOPHEN 5-325 MG PO TABS: 1.0000 | ORAL_TABLET | ORAL | Status: DC | PRN

## 2017-09-08 MED ORDER — HYDROMORPHONE HCL 2 MG PO TABS
1.0000 mg | ORAL_TABLET | ORAL | Status: DC | PRN
  Administered 2017-09-09: 1 mg via ORAL
  Filled 2017-09-08: qty 1

## 2017-09-08 MED ORDER — GABAPENTIN 100 MG PO CAPS
100.0000 mg | ORAL_CAPSULE | Freq: Two times a day (BID) | ORAL | Status: DC
  Administered 2017-09-09: 100 mg via ORAL
  Filled 2017-09-08 (×3): qty 1

## 2017-09-08 MED ORDER — KETOROLAC TROMETHAMINE 15 MG/ML IJ SOLN
15.0000 mg | Freq: Three times a day (TID) | INTRAMUSCULAR | Status: DC
  Administered 2017-09-08 – 2017-09-09 (×5): 15 mg via INTRAVENOUS
  Filled 2017-09-08 (×5): qty 1

## 2017-09-08 MED ORDER — HYDROMORPHONE HCL 1 MG/ML IJ SOLN
1.0000 mg | INTRAMUSCULAR | Status: DC | PRN
  Administered 2017-09-08: 1 mg via INTRAVENOUS
  Filled 2017-09-08: qty 1

## 2017-09-08 MED ORDER — HYDROMORPHONE HCL 2 MG PO TABS: 1.0000 mg | ORAL_TABLET | ORAL | Status: DC | PRN

## 2017-09-08 MED ORDER — HYDROMORPHONE HCL 1 MG/ML IJ SOLN: 0.5000 mg | INTRAMUSCULAR | Status: DC | PRN

## 2017-09-08 NOTE — Evaluation (Signed)
Physical Therapy Evaluation Patient Details Name: Maureen Taylor MRN: [id number] DOB: [birth year] Today's Date: 09/08/2017   History of Present Illness  Pt is an 82 y.o. female initially presenting to ED 09/14/2017 to evaluate R foot (chronic diabetic heel ulcer).  Imaging showing osteomyelitis posterior R calcaneous.  Pt s/p angio 09/19/2017.  Pt s/p R AKA 09/02/2017.  PMH includes Parkinson's disease, SIRS, DM, L heel wound.  Clinical Impression  Prior to hospital admission, pt has not walked since September and recently required 1-2 assist with transfers to w/c (pt does not assist with w/c propulsion).  Pt is a long term care resident at [facility].  Currently pt is 2 assist with bed mobility for safety and assist.  Pt only occasionally followed simple 1 step commands during session and tended to grab onto therapists (and maintain hold of therapists arms or clothing) during session complicating treatment.  Pt did not verbalize any words during session.  Pt impulsive sitting edge of bed trying to put R LE (AKA) to ground to stand and requiring assist to maintain safety (deferred further mobility d/t safety concerns).  Currently will see pt 2-6x's/week and will upgrade frequency pending pt's progress and participation.  Pt would benefit from skilled PT to address noted impairments and functional limitations (see below for any additional details).  Upon hospital discharge, recommend pt discharge to [facility].    Follow Up Recommendations SNF    Equipment Recommendations  Other (comment)(TBD at facility)    Recommendations for Other Services       Precautions / Restrictions Precautions Precautions: Fall Precaution Comments: Aspiration; L heel wound Restrictions Weight Bearing Restrictions: Yes RLE Weight Bearing: Non weight bearing Other Position/Activity Restrictions: R AKA      Mobility  Bed Mobility Overal bed mobility: Needs Assistance Bed Mobility: Supine to Sit;Sit to Supine      Supine to sit: Min assist;Mod assist;+2 for physical assistance;HOB elevated Sit to supine: Min assist;Mod assist;+2 for physical assistance;HOB elevated   General bed mobility comments: assist for trunk and B LE's supine to/from sit  Transfers                 General transfer comment: Deferred d/t safety concerns  Ambulation/Gait                Stairs            Wheelchair Mobility    Modified Rankin (Stroke Patients Only)       Balance Overall balance assessment: Needs assistance Sitting-balance support: Bilateral upper extremity supported(single foot supported) Sitting balance-Leahy Scale: Poor Sitting balance - Comments: Requires B UE support for static sitting balance (CGA for safety)                                     Pertinent Vitals/Pain Pain Assessment: Faces Faces Pain Scale: Hurts even more Pain Location: R LE amputation site Pain Descriptors / Indicators: Grimacing Pain Intervention(s): Limited activity within patient's tolerance;Monitored during session;Premedicated before session;Repositioned    Home Living Family/patient expects to be discharged to:: Skilled nursing facility                 Additional Comments: Long term care resident of [facility].    Prior Function Level of Independence: Needs assistance   Gait / Transfers Assistance Needed: Pt has not walked since September and requiring 1-2 assist with transfers to w/c recently (pt does not  propel/assist with propelling w/c).  ADL's / Homemaking Assistance Needed: Assist for dressing, bathing, meals, medications, cleaning.  Pt has had decreased use of R UE and uses R UE brace that holds utensils for feeding.        Hand Dominance        Extremity/Trunk Assessment   Upper Extremity Assessment Upper Extremity Assessment: Difficult to assess due to impaired cognition(Pt actively moving B UE's in bed but not to command)    Lower Extremity  Assessment Lower Extremity Assessment: Difficult to assess due to impaired cognition(Pt actively moving R AKA and L LE in bed but not to command)    Cervical / Trunk Assessment Cervical / Trunk Assessment: Kyphotic  Communication   Communication: No difficulties  Cognition Arousal/Alertness: Awake/alert Behavior During Therapy: Impulsive;Restless Overall Cognitive Status: History of cognitive impairments - at baseline                                 General Comments: Pt's "boyfriend" present and reports that sometimes pt is very talkative and other days doesn't talk much at all.      General Comments General comments (skin integrity, edema, etc.): Foley catheter in place.   Nursing cleared pt for participation in physical therapy.    Exercises     Assessment/Plan    PT Assessment Patient needs continued PT services  PT Problem List Decreased strength;Decreased activity tolerance;Decreased balance;Decreased mobility;Decreased knowledge of use of DME;Decreased knowledge of precautions;Pain       PT Treatment Interventions DME instruction;Functional mobility training;Therapeutic activities;Therapeutic exercise;Balance training;Patient/family education    PT Goals (Current goals can be found in the Care Plan section)  Acute Rehab PT Goals Patient Stated Goal: pt not able to state any goals PT Goal Formulation: Patient unable to participate in goal setting Time For Goal Achievement: 09/22/17 Potential to Achieve Goals: Fair    Frequency Min 2X/week   Barriers to discharge        Co-evaluation               AM-PAC PT "6 Clicks" Daily Activity  Outcome Measure Difficulty turning over in bed (including adjusting bedclothes, sheets and blankets)?: Unable Difficulty moving from lying on back to sitting on the side of the bed? : Unable Difficulty sitting down on and standing up from a chair with arms (e.g., wheelchair, bedside commode, etc,.)?: Unable Help  needed moving to and from a bed to chair (including a wheelchair)?: Total Help needed walking in hospital room?: Total Help needed climbing 3-5 steps with a railing? : Total 6 Click Score: 6    End of Session   Activity Tolerance: Patient limited by pain Patient left: in bed;with call bell/phone within reach;with bed alarm set;with family/visitor present;with SCD's reapplied(L LE SCD in place and activated) Nurse Communication: Mobility status;Precautions;Other (comment)(Pt's "boyfriend" requesting bed not to be in lowest position so he could easily see pt (pt lowered most of the way but left a little raised and pt's "boyfriend" stated he would not leave her side and would notify someone if he had to leave her alone)) PT Visit Diagnosis: Other abnormalities of gait and mobility (R26.89);Muscle weakness (generalized) (M62.81);Pain Pain - Right/Left: Right Pain - part of body: Leg    Time: 1015-1046 PT Time Calculation (min) (ACUTE ONLY): 31 min   Charges:   PT Evaluation $PT Eval Low Complexity: 1 Low PT Treatments $Therapeutic Activity: 8-22 mins   PT G  Codes:   PT G-Codes **NOT FOR INPATIENT CLASS** Functional Assessment Tool Used: AM-PAC 6 Clicks Basic Mobility Functional Limitation: Mobility: Walking and moving around Mobility: Walking and Moving Around Current Status (W1191): 100 percent impaired, limited or restricted Mobility: Walking and Moving Around Goal Status (Y7829): At least 60 percent but less than 80 percent impaired, limited or restricted    Hendricks Limes, PT 09/08/17, 2:33 PM [phone]

## 2017-09-08 NOTE — Progress Notes (Signed)
[facility]  Daily Progress Note   Subjective  - 1 Day Post-Op  Patient seems to be doing well.  Had pain last night but improved today.  Objective Vitals:   09/08/17 0425 09/08/17 0725 09/08/17 0808 09/08/17 1345  BP: (!) 163/58 (!) 144/67    Pulse: 87 (!) 112    Resp: 18 20    Temp: 98.3 F (36.8 C)   97.6 F (36.4 C)  TempSrc: Axillary   Axillary  SpO2: 94% 93% 94%   Weight:      Height:        Intake/Output Summary (Last 24 hours) at 09/08/2017 1522 Last data filed at 09/08/2017 0429 Gross per 24 hour  Intake 1000 ml  Output 650 ml  Net 350 ml    PULM  CTAB CV  tachycardic VASC  dressing is clean, dry, and intact  Laboratory CBC    Component Value Date/Time   WBC 15.2 (H) 09/08/2017 0301   HGB 8.7 (L) 09/08/2017 0301   HGB 12.8 06/01/2014 0414   HCT 26.9 (L) 09/08/2017 0301   HCT 38.8 06/01/2014 0414   PLT 323 09/08/2017 0301   PLT 235 06/01/2014 0414    BMET    Component Value Date/Time   NA 141 09/08/2017 0301   NA 137 01/10/2017   NA 142 05/31/2014 0455   K 4.1 09/08/2017 0301   K 3.7 05/31/2014 0455   CL 109 09/08/2017 0301   CL 107 05/31/2014 0455   CO2 25 09/08/2017 0301   CO2 27 05/31/2014 0455   GLUCOSE 168 (H) 09/08/2017 0301   GLUCOSE 234 (H) 05/31/2014 0455   BUN 13 09/08/2017 0301   BUN 19 01/10/2017   BUN 12 05/31/2014 0455   CREATININE 0.69 09/08/2017 0301   CREATININE 1.09 05/31/2014 0455   CALCIUM 9.0 09/08/2017 0301   CALCIUM 8.4 (L) 05/31/2014 0455   GFRNONAA >60 09/08/2017 0301   GFRNONAA 49 (L) 05/31/2014 0455   GFRAA >60 09/08/2017 0301   GFRAA 57 (L) 05/31/2014 0455    Assessment/Planning: POD #1 s/p right above-knee amputation   Physical therapy to see today.  Leave dressing on until Sunday.  Can likely go back to [facility] on Sunday or Monday    Festus BarrenJason Urian Martenson  09/08/2017, 3:22 PM

## 2017-09-08 NOTE — Progress Notes (Signed)
Patient ID: Maureen Taylor, female   DOB: 02-08-1935, 82 y.o.   MRN: [id number]   [facility] PROGRESS NOTE  Maureen Taylor [id number] DOB: 02-08-1935 DOA: 09/02/2017 PCP: Dorothey BasemanBronstein, David, MD  HPI/Subjective: Patient yelling in pain.  I was unable to get her to focus.  Objective: Vitals:   09/08/17 0808 09/08/17 1345  BP:    Pulse:    Resp:    Temp:  97.6 F (36.4 C)  SpO2: 94%     Filed Weights   08/28/2017 1556 09/10/2017 1954 09/12/2017 1632  Weight: 90.5 kg (199 lb 9.6 oz) 88.2 kg (194 lb 6.4 oz) 88 kg (194 lb)    ROS: Review of Systems  Unable to perform ROS: Severity of pain   Exam: Physical Exam  HENT:  Nose: No mucosal edema.  Mouth dry  Eyes: Conjunctivae and lids are normal. Pupils are equal, round, and reactive to light.  Neck: No JVD present. Carotid bruit is not present. No edema present. No thyroid mass and no thyromegaly present.  Cardiovascular: S1 normal and S2 normal. Exam reveals no gallop.  No murmur heard. Pulses:      Dorsalis pedis pulses are 2+ on the right side, and 2+ on the left side.  Respiratory: No respiratory distress. She has decreased breath sounds in the right lower field and the left lower field. She has no wheezes. She has no rhonchi. She has no rales.  GI: Soft. Bowel sounds are normal. There is no tenderness.  Musculoskeletal:       Right ankle: She exhibits swelling.       Left ankle: She exhibits swelling.  Lymphadenopathy:    She has no cervical adenopathy.  Neurological:  Patient agitated with pain  Skin: Skin is warm. Nails show no clubbing.  Right leg amputation covered with pressure dressing Facial erythema much less. As per nursing staff, lots of skin tears and stage II areas on the buttock Small stage II on left heel  Psychiatric: She is agitated.      Data Reviewed: Basic Metabolic Panel: Recent Labs  Lab 09/02/17 0309 09/03/17 0321 09/14/2017 0440 09/05/17 0300 09/08/17 0301  NA 140 142 141 140  141  K 4.2 4.8 5.0 4.1 4.1  CL 104 103 104 103 109  CO2 28 27 26 28 25   GLUCOSE 102* 140* 118* 133* 168*  BUN 32* 28* 22* 15 13  CREATININE 1.14* 1.02* 0.94 0.82 0.69  CALCIUM 9.5 10.0 10.2 9.7 9.0   Liver Function Tests: Recent Labs  Lab 09/23/2017 1731  AST 21  ALT <5*  ALKPHOS 132*  BILITOT 0.1*  PROT 7.8  ALBUMIN 3.0*   CBC: Recent Labs  Lab 09/09/2017 1731 09/02/17 0309 09/03/17 0321 09/05/17 0300 09/08/17 0301  WBC 18.5* 13.7* 12.2* 9.8 15.2*  NEUTROABS 16.4*  --   --   --   --   HGB 12.3 10.8* 11.1* 9.8* 8.7*  HCT 37.7 32.6* 33.7* 29.1* 26.9*  MCV 89.5 88.0 89.1 88.0 88.3  PLT 565* 493* 491* 411 323    CBG: Recent Labs  Lab 09/11/2017 1459 09/22/2017 1828 09/09/2017 2114 09/08/17 0733 09/08/17 1151  GLUCAP 117* 129* 165* 153* 163*    Recent Results (from the past 240 hour(s))  Blood Culture (routine x 2)     Status: None   Collection Time: 09/09/2017  5:37 PM  Result Value Ref Range Status   Specimen Description LEFT ANTECUBITAL  Final   Special Requests   Final  BOTTLES DRAWN AEROBIC AND ANAEROBIC Blood Culture adequate volume   Culture NO GROWTH 5 DAYS  Final   Report Status 09/06/2017 FINAL  Final  Blood Culture (routine x 2)     Status: None   Collection Time: 05/04/17  6:31 PM  Result Value Ref Range Status   Specimen Description BLOOD LEFT ARM  Final   Special Requests   Final    BOTTLES DRAWN AEROBIC AND ANAEROBIC Blood Culture adequate volume   Culture NO GROWTH 5 DAYS  Final   Report Status 09/06/2017 FINAL  Final  MRSA PCR Screening     Status: Abnormal   Collection Time: 05/04/17 10:10 PM  Result Value Ref Range Status   MRSA by PCR POSITIVE (A) NEGATIVE Final    Comment:        The GeneXpert MRSA Assay (FDA approved for NASAL specimens only), is one component of a comprehensive MRSA colonization surveillance program. It is not intended to diagnose MRSA infection nor to guide or monitor treatment for MRSA infections. RESULT CALLED  TO, READ BACK BY AND VERIFIED WITH: [facility] RAYMOND AT 0014 09/02/17.PMH      Scheduled Meds: . ARIPiprazole  2.5 mg Oral BID  . atorvastatin  20 mg Oral Daily  . budesonide (PULMICORT) nebulizer solution  0.5 mg Nebulization BID  . carbidopa-levodopa  1 tablet Oral BID  . docusate sodium  100 mg Oral BID  . DULoxetine  60 mg Oral Daily  . feeding supplement (ENSURE ENLIVE)  237 mL Oral BID BM  . folic acid  1 mg Oral Once per day on Sun Mon Tue Wed Thu Sat  . furosemide  40 mg Oral Daily  . gabapentin  100 mg Oral QHS  . Gerhardt's butt cream   Topical BID  . heparin  5,000 Units Subcutaneous Q8H  . insulin aspart  0-9 Units Subcutaneous TID WC  . ipratropium-albuterol  3 mL Nebulization BID  . lamoTRIgine  100 mg Oral BID  . magnesium oxide  400 mg Oral Daily  . methotrexate  15 mg Oral Q Fri  . metroNIDAZOLE   Topical BID  . pantoprazole  40 mg Oral Daily  . polyethylene glycol  17 g Oral BID  . polyvinyl alcohol  1 drop Both Eyes TID  . vitamin C  250 mg Oral BID   Continuous Infusions: . sodium chloride 30 mL/hr at 09/03/2017 1920  . ceFEPime (MAXIPIME) IV Stopped (09/08/17 1023)  . vancomycin 1,250 mg (09/08/17 1313)    Assessment/Plan:  1. Severe right leg pain after amputation.  Patient has as needed IV Dilaudid and I also ordered oral Dilaudid.  Need to watch respiratory status closely with the Dilaudid.  Case discussed with nursing staff.  Patient not taking much orally at this point.  Pain will have to be better controlled on oral pain medications prior to disposition. 2. Acute on chronic osteomyelitis.  Continue vancomycin at a slower infusion rate and cefepime.  Should be able to stop antibiotics soon. 3. Type 2 diabetes mellitus.  Since not eating much I will hold Lantus. 4. Diabetic neuropathy.  On gabapentin. 5.  blood pressure up with pain.  Continue to hold antihypertensives 6. Hyperlipidemia unspecified on statin 7. Parkinson's on Sinemet 8. Bipolar disorder  continue psychiatric medication  Code Status:     Code Status Orders  (From admission, onward)        Start     Ordered   05/04/17 1948  Full code  Continuous  09/10/2017 1947    Code Status History    Date Active Date Inactive Code Status Order ID Comments User Context   06/17/2017 15:52 06/24/2017 19:37 Full Code [id number]  Marguarite Arbour, MD Inpatient   10/05/2016 12:17 10/09/2016 18:37 Full Code [id number]  Gwenyth Bender, NP ED   08/24/2016 20:19 08/27/2016 19:12 Full Code [id number]  Narda Bonds, MD Inpatient    Advance Directive Documentation     Most Recent Value  Type of Advance Directive  Out of facility DNR (pink MOST or yellow form)  Pre-existing out of facility DNR order (yellow form or pink MOST form)  Pink MOST form placed in chart (order not valid for inpatient use)  "MOST" Form in Place?  No data     Family Communication: Case discussed with significant other at bedside Disposition Plan: We will be in the hospital few days after amputation  Consultants:  Podiatry  Vascular surgery  Antibiotics:  Vancomycin  Cefepime  Time spent: 20 minutes  Tameya Kuznia [employer]

## 2017-09-08 NOTE — Progress Notes (Signed)
Patient is post op day 1 from a right AKA. Patient will likely D/C back to [facility] Sunday or Monday pending medical clearance. [facility] liaison is aware of above.   Baker Hughes IncorporatedBailey Cayla Wiegand, LCSW [phone]

## 2017-09-08 NOTE — Progress Notes (Signed)
Daily Progress Note   Patient Name: Maureen Taylor       Date: 09/08/2017 DOB: 1935/03/04  Age: 82 y.o. MRN#: [id number] Attending Physician: Alford Highland, MD Primary Care Physician: Dorothey Baseman, MD Admit Date: 08/31/2017  Reason for Consultation/Follow-up: Establishing goals of care  Subjective: Maureen Taylor is resting in bed. She is sleeping, and awakens with moving her leg and stares with eyes wide open but does not speak. Family at bedside. They state she has been sleepy but arousable. Spoke with POA Maureen Taylor who states she believes her mother's reaction to Morphine is itching, she is not sure of any other reaction. IV Dilaudid decreased, oral Dilaudid in place, Flexeril added and Neurontin increased. Per POA, patient wants full code status.   IV Toradol ordered for 3 days 15mg . Spoke with Dr. Renae Gloss about this plan, daily BMP ordered, primary team will need to monitor renal function daily as palliative team is not available over the weekend.  Consider IV Robaxin for muscle spasms.   Recommend palliative to follow outpatient.   Length of Stay: 7  Current Medications: Scheduled Meds:  . acetaminophen  1,000 mg Oral Q6H  . ARIPiprazole  2.5 mg Oral BID  . atorvastatin  20 mg Oral Daily  . budesonide (PULMICORT) nebulizer solution  0.5 mg Nebulization BID  . carbidopa-levodopa  1 tablet Oral BID  . docusate sodium  100 mg Oral BID  . DULoxetine  60 mg Oral Daily  . feeding supplement (ENSURE ENLIVE)  237 mL Oral BID BM  . folic acid  1 mg Oral Once per day on Sun Mon Tue Wed Thu Sat  . furosemide  40 mg Oral Daily  . gabapentin  100 mg Oral BID  . Maureen Taylor's butt cream   Topical BID  . heparin  5,000 Units Subcutaneous Q8H  . insulin aspart  0-9 Units Subcutaneous TID WC  .  ipratropium-albuterol  3 mL Nebulization BID  . lamoTRIgine  100 mg Oral BID  . magnesium oxide  400 mg Oral Daily  . methotrexate  15 mg Oral Q Fri  . metroNIDAZOLE   Topical BID  . pantoprazole  40 mg Oral Daily  . polyethylene glycol  17 g Oral BID  . polyvinyl alcohol  1 drop Both Eyes TID  . vitamin C  250 mg  Oral BID    Continuous Infusions: . sodium chloride 30 mL/hr at 11-16-16 1920  . ceFEPime (MAXIPIME) IV Stopped (09/08/17 1023)  . vancomycin 1,250 mg (09/08/17 1313)    PRN Meds: alum & mag hydroxide-simeth, bisacodyl, cyclobenzaprine, HYDROmorphone (DILAUDID) injection, HYDROmorphone, ipratropium-albuterol  Physical Exam  Constitutional:  Sleepy, awakens wide eyed when moving leg.             Vital Signs: BP (!) 144/67   Pulse (!) 112   Temp 97.6 F (36.4 C) (Axillary)   Resp 20   Ht 5' (1.524 m)   Wt 88 kg (194 lb)   SpO2 94%   BMI 37.89 kg/m  SpO2: SpO2: 94 % O2 Device: O2 Device: Nasal Cannula O2 Flow Rate: O2 Flow Rate (L/min): 2 L/min  Intake/output summary:   Intake/Output Summary (Last 24 hours) at 09/08/2017 1512 Last data filed at 09/08/2017 0429 Gross per 24 hour  Intake 1000 ml  Output 650 ml  Net 350 ml   LBM: Last BM Date: 09/06/17 Baseline Weight: Weight: 90.5 kg (199 lb 9.6 oz) Most recent weight: Weight: 88 kg (194 lb)       Palliative Assessment/Data:    Flowsheet Rows     Most Recent Value  Intake Tab  Referral Department  Hospitalist  Unit at Time of Referral  Med/Surg Unit  Palliative Care Primary Diagnosis  Sepsis/Infectious Disease  Date Notified  09/06/17  Palliative Care Type  Return patient Palliative Care  Reason for referral  Clarify Goals of Care  Date of Admission  09/21/2017  Date first seen by Palliative Care  09/06/17  # of days Palliative referral response time  0 Day(s)  # of days IP prior to Palliative referral  5  Clinical Assessment  Psychosocial & Spiritual Assessment  Palliative Care Outcomes        Patient Active Problem List   Diagnosis Date Noted  . Osteomyelitis (HCC) 09/24/2017  . Pressure injury of skin 06/20/2017  . SIRS (systemic inflammatory response syndrome) (HCC) 06/17/2017  . Sepsis (HCC) 10/05/2016  . Acute encephalopathy 10/05/2016  . Diabetes mellitus with complication (HCC)   . Complicated UTI (urinary tract infection)   . Acute cystitis without hematuria   . Morbid obesity (HCC)   . UTI (urinary tract infection) 08/24/2016  . Altered mental status 08/24/2016  . Constipation 08/24/2016  . Hyperlipidemia 06/05/2015  . Type 2 diabetes mellitus with diabetic polyneuropathy (HCC) 06/05/2015  . Vitamin D deficiency 06/05/2015  . Recurrent UTI 04/17/2015  . Vaginal atrophy 04/17/2015  . Incontinence 04/17/2015  . OAB (overactive bladder) 03/03/2015  . Bipolar depression (HCC) 01/05/2015  . GERD (gastroesophageal reflux disease) 01/05/2015  . Allergic rhinitis 01/05/2015  . Essential hypertension 01/05/2015  . Candida infection 01/05/2015  . Parkinson's disease (HCC) 01/05/2015  . Neuropathy 01/05/2015  . Diabetes mellitus with neuropathy (HCC) 01/05/2015    Palliative Care Assessment & Plan   Patient Profile: [name] a82 y.o.femalewith a known history of Charcot's joint, GERD, HTn, DM, HLD, Obesity, neuropathy, Osteomyelitis on foot- treated with doxy for 14 days in Sept 2018- sent to [facility], cont to have repeated Inf- also was given Rocephin and Clindamycin for last few days, still no improvement, so sent to ER.   Assessment: Patient very sleepy. Discussed with Dr. Renae GlossWieting.   Recommendations/Plan:  Flexeril 5mg  initiated Neurontin increased to home dose. Consider increasing tomorrow for neuropathic pain to 200mg  BID.  Toradol 15mg  for 3 days. Monitor renal fx Oral Dilaudid for moderate  or severe pain.  IV Dilaudid for breakthrough pain.    Code Status:    Code Status Orders  (From admission, onward)        Start     Ordered    2017-09-13 1948  Full code  Continuous     2017-09-13 1947    Code Status History    Date Active Date Inactive Code Status Order ID Comments User Context   06/17/2017 15:52 06/24/2017 19:37 Full Code [id number]  Marguarite ArbourSparks, Jeffrey D, MD Inpatient   10/05/2016 12:17 10/09/2016 18:37 Full Code [id number]  Gwenyth BenderBlack, Karen M, NP ED   08/24/2016 20:19 08/27/2016 19:12 Full Code [id number]  Narda BondsNettey, Ralph A, MD Inpatient    Advance Directive Documentation     Most Recent Value  Type of Advance Directive  Out of facility DNR (pink MOST or yellow form)  Pre-existing out of facility DNR order (yellow form or pink MOST form)  Pink MOST form placed in chart (order not valid for inpatient use)  "MOST" Form in Place?  No data       Prognosis:   Unable to determine  Discharge Planning:  To Be Determined  Care plan was discussed with Rosalin HawkingZeba Anwar MD and Dr. Renae GlossWieting MD.  Thank you for allowing the Palliative Medicine Team to assist in the care of this patient.   Total Time 35 min Prolonged Time Billed No      Greater than 50%  of this time was spent counseling and coordinating care related to the above assessment and plan.  Morton Stallrystal Naiyana Barbian, NP  Please contact Palliative Medicine Team phone at [phone] for questions and concerns.

## 2017-09-08 NOTE — Progress Notes (Signed)
Recommend outpatient palliative medicine consult.      No charge.

## 2017-09-08 NOTE — Progress Notes (Signed)
Pharmacy Antibiotic Note  Maureen Taylor is a 82 y.o. female admitted on 08/31/2017 with Osteomyelitis.  Pharmacy has been consulted for vancomycin and cefepime dosing.  Patient was originally started on Vancomycin and Zosyn - However it was reported that patient had redness through out body. Vanc and Zosyn were discontinued and levofloxacin and clinda were started.   MD believes reaction was Redman's Syndrome and had  vancomycin restarted with slower infusion.   Plan: Ke:0.049   T1/2: 14.4   VD: 44  Vancomycin: Continue vancomycin at 1250mg  IV every 24 hours over 3 hours. Calculated trough at Css is 17. Trough level ordered prior to 4th dose. Will monitor renal function and adjust dose as needed.  Continue cefepime 2g IV every 12 hours based on current CrCl <3660ml/min.    Height: 5' (152.4 cm) Weight: 194 lb (88 kg) IBW/kg (Calculated) : 45.5  Temp (24hrs), Avg:98.2 F (36.8 C), Min:97.7 F (36.5 C), Max:98.9 F (37.2 C)  Recent Labs  Lab 08/28/2017 1731 08/31/2017 1734 09/02/17 0309 09/03/17 0321 09/23/2017 0440 09/05/17 0300 09/08/17 0301  WBC 18.5*  --  13.7* 12.2*  --  9.8 15.2*  CREATININE 1.48*  --  1.14* 1.02* 0.94 0.82 0.69  LATICACIDVEN  --  1.4  --   --   --   --   --     Estimated Creatinine Clearance: 55.3 mL/min (by C-G formula based on SCr of 0.69 mg/dL).    Allergies  Allergen Reactions  . Codeine Itching  . Morphine And Related Itching  . Indocin [Indomethacin] Rash    Antimicrobials this admission: Anti-infectives (From admission, onward)   Start     Dose/Rate Route Frequency Ordered Stop   09/05/17 1000  ceFEPIme (MAXIPIME) 2 g in dextrose 5 % 50 mL IVPB     2 g 100 mL/hr over 30 Minutes Intravenous Every 12 hours 09/05/17 0829     09/05/17 0900  vancomycin (VANCOCIN) 1,250 mg in sodium chloride 0.9 % 250 mL IVPB     1,250 mg 83.3 mL/hr over 180 Minutes Intravenous Every 24 hours 09/05/17 0829     09/25/2017 1800  levofloxacin (LEVAQUIN) IVPB 500 mg   Status:  Discontinued     500 mg 100 mL/hr over 60 Minutes Intravenous Every 24 hours 09/13/2017 1605 09/05/17 0824   09/17/2017 1337  ceFAZolin (ANCEF) 2-4 GM/100ML-% IVPB    Comments:  Talbert ForestBrogan, Terry   : cabinet override      09/03/2017 1337 09/05/17 0144   09/22/2017 1200  ceFAZolin (ANCEF) IVPB 2g/100 mL premix     2 g 200 mL/hr over 30 Minutes Intravenous  Once 09/22/2017 1146 08/28/2017 1401   09/03/17 2200  Levofloxacin (LEVAQUIN) IVPB 250 mg  Status:  Discontinued     250 mg 50 mL/hr over 60 Minutes Intravenous Every 24 hours 09/02/17 0159 08/28/2017 1605   09/02/17 0600  clindamycin (CLEOCIN) IVPB 600 mg  Status:  Discontinued     600 mg 100 mL/hr over 30 Minutes Intravenous Every 8 hours 09/02/17 0145 09/05/17 0824   09/02/17 0200  vancomycin (VANCOCIN) 1,250 mg in sodium chloride 0.9 % 250 mL IVPB  Status:  Discontinued     1,250 mg 166.7 mL/hr over 90 Minutes Intravenous Every 48 hours 09/08/2017 1910 09/11/2017 2214   09/02/17 0200  levofloxacin (LEVAQUIN) IVPB 500 mg     500 mg 100 mL/hr over 60 Minutes Intravenous  Once 09/02/17 0145 09/02/17 0502   08/30/2017 2200  piperacillin-tazobactam (ZOSYN) IVPB 3.375 g  Status:  Discontinued     3.375 g 12.5 mL/hr over 240 Minutes Intravenous Every 8 hours 04-07-17 1907 04-07-17 2214   04-07-17 1630  piperacillin-tazobactam (ZOSYN) IVPB 3.375 g     3.375 g 100 mL/hr over 30 Minutes Intravenous  Once 04-07-17 1628 04-07-17 2001   04-07-17 1630  vancomycin (VANCOCIN) IVPB 1000 mg/200 mL premix     1,000 mg 200 mL/hr over 60 Minutes Intravenous  Once 04-07-17 1628 04-07-17 2038      Microbiology results: Recent Results (from the past 240 hour(s))  Blood Culture (routine x 2)     Status: None   Collection Time: 04-07-17  5:37 PM  Result Value Ref Range Status   Specimen Description LEFT ANTECUBITAL  Final   Special Requests   Final    BOTTLES DRAWN AEROBIC AND ANAEROBIC Blood Culture adequate volume   Culture NO GROWTH 5 DAYS  Final   Report  Status 09/06/2017 FINAL  Final  Blood Culture (routine x 2)     Status: None   Collection Time: 04-07-17  6:31 PM  Result Value Ref Range Status   Specimen Description BLOOD LEFT ARM  Final   Special Requests   Final    BOTTLES DRAWN AEROBIC AND ANAEROBIC Blood Culture adequate volume   Culture NO GROWTH 5 DAYS  Final   Report Status 09/06/2017 FINAL  Final  MRSA PCR Screening     Status: Abnormal   Collection Time: 04-07-17 10:10 PM  Result Value Ref Range Status   MRSA by PCR POSITIVE (A) NEGATIVE Final    Comment:        The GeneXpert MRSA Assay (FDA approved for NASAL specimens only), is one component of a comprehensive MRSA colonization surveillance program. It is not intended to diagnose MRSA infection nor to guide or monitor treatment for MRSA infections. RESULT CALLED TO, READ BACK BY AND VERIFIED WITH: Surgery Alliance LtdMARY RAYMOND AT 0014 09/02/17.PMH      Thank you for allowing pharmacy to be a part of this patient's care.  Demetrius CharityJames,Skylan Gift D, PharmD, BCPS Clinical Pharmacist 09/08/2017 9:38 AM

## 2017-09-09 LAB — BASIC METABOLIC PANEL
Anion gap: 6 (ref 5–15)
BUN: 16 mg/dL (ref 6–20)
CALCIUM: 9.3 mg/dL (ref 8.9–10.3)
CO2: 26 mmol/L (ref 22–32)
CREATININE: 1.13 mg/dL — AB (ref 0.44–1.00)
Chloride: 112 mmol/L — ABNORMAL HIGH (ref 101–111)
GFR calc non Af Amer: 45 mL/min — ABNORMAL LOW (ref >60)
GFR, EST AFRICAN AMERICAN: 52 mL/min — AB (ref >60)
GLUCOSE: 160 mg/dL — AB (ref 65–99)
Potassium: 4.3 mmol/L (ref 3.5–5.1)
Sodium: 144 mmol/L (ref 135–145)

## 2017-09-09 LAB — GLUCOSE, CAPILLARY
GLUCOSE-CAPILLARY: 155 mg/dL — AB (ref 65–99)
GLUCOSE-CAPILLARY: 175 mg/dL — AB (ref 65–99)
GLUCOSE-CAPILLARY: 193 mg/dL — AB (ref 65–99)
GLUCOSE-CAPILLARY: 151 mg/dL — AB (ref 65–99)
Glucose-Capillary: 139 mg/dL — ABNORMAL HIGH (ref 65–99)

## 2017-09-09 LAB — CBC
HEMATOCRIT: 24.5 % — AB (ref 35.0–47.0)
HEMOGLOBIN: 7.8 g/dL — AB (ref 12.0–16.0)
MCH: 28.9 pg (ref 26.0–34.0)
MCHC: 32 g/dL (ref 32.0–36.0)
MCV: 90.3 fL (ref 80.0–100.0)
Platelets: 274 10*3/uL (ref 150–440)
RBC: 2.71 MIL/uL — ABNORMAL LOW (ref 3.80–5.20)
RDW: 15.8 % — AB (ref 11.5–14.5)
WBC: 20.4 10*3/uL — ABNORMAL HIGH (ref 3.6–11.0)

## 2017-09-09 LAB — VANCOMYCIN, TROUGH
VANCOMYCIN TR: 23 ug/mL — AB (ref 15–20)
Vancomycin Tr: 25 ug/mL (ref 15–20)

## 2017-09-09 NOTE — Progress Notes (Signed)
2 Days Post-Op   Subjective/Chief Complaint: Family at bedside. Notes patient had pain, well controlled with current regimen. Otherwise without complaint.   Objective: Vital signs in last 24 hours: Temp:  [97.5 F (36.4 C)-99.1 F (37.3 C)] 97.5 F (36.4 C) (12/15 0732) Pulse Rate:  [94-117] 117 (12/15 0732) Resp:  [18-20] 19 (12/15 0732) BP: (113-153)/(61-76) 116/62 (12/15 0732) SpO2:  [95 %-100 %] 99 % (12/15 0732) Last BM Date: 09/06/17  Intake/Output from previous day: 12/14 0701 - 12/15 0700 In: -  Out: 450 [Urine:450] Intake/Output this shift: Total I/O In: -  Out: 250 [Urine:250]  General appearance: alert and no distress Extremities: Right AKA stump dressing C/D/I  Lab Results:  Recent Labs    09/08/17 0301 09/09/17 0353  WBC 15.2* 20.4*  HGB 8.7* 7.8*  HCT 26.9* 24.5*  PLT 323 274   BMET Recent Labs    09/08/17 0301 09/09/17 0353  NA 141 144  K 4.1 4.3  CL 109 112*  CO2 25 26  GLUCOSE 168* 160*  BUN 13 16  CREATININE 0.69 1.13*  CALCIUM 9.0 9.3   PT/INR No results for input(s): LABPROT, INR in the last 72 hours. ABG No results for input(s): PHART, HCO3 in the last 72 hours.  Invalid input(s): PCO2, PO2  Studies/Results: No results found.  Anti-infectives: Anti-infectives (From admission, onward)   Start     Dose/Rate Route Frequency Ordered Stop   09/05/17 1000  ceFEPIme (MAXIPIME) 2 g in dextrose 5 % 50 mL IVPB     2 g 100 mL/hr over 30 Minutes Intravenous Every 12 hours 09/05/17 0829     09/05/17 0900  vancomycin (VANCOCIN) 1,250 mg in sodium chloride 0.9 % 250 mL IVPB     1,250 mg 83.3 mL/hr over 180 Minutes Intravenous Every 24 hours 09/05/17 0829     09/09/2017 1800  levofloxacin (LEVAQUIN) IVPB 500 mg  Status:  Discontinued     500 mg 100 mL/hr over 60 Minutes Intravenous Every 24 hours 09/20/2017 1605 09/05/17 0824   09/20/2017 1337  ceFAZolin (ANCEF) 2-4 GM/100ML-% IVPB    Comments:  Talbert ForestBrogan, Terry   : cabinet override   09/18/2017 1337 09/05/17 0144   09/17/2017 1200  ceFAZolin (ANCEF) IVPB 2g/100 mL premix     2 g 200 mL/hr over 30 Minutes Intravenous  Once 09/12/2017 1146 09/13/2017 1401   09/03/17 2200  Levofloxacin (LEVAQUIN) IVPB 250 mg  Status:  Discontinued     250 mg 50 mL/hr over 60 Minutes Intravenous Every 24 hours 09/02/17 0159 09/18/2017 1605   09/02/17 0600  clindamycin (CLEOCIN) IVPB 600 mg  Status:  Discontinued     600 mg 100 mL/hr over 30 Minutes Intravenous Every 8 hours 09/02/17 0145 09/05/17 0824   09/02/17 0200  vancomycin (VANCOCIN) 1,250 mg in sodium chloride 0.9 % 250 mL IVPB  Status:  Discontinued     1,250 mg 166.7 mL/hr over 90 Minutes Intravenous Every 48 hours 2017/02/20 1910 2017/02/20 2214   09/02/17 0200  levofloxacin (LEVAQUIN) IVPB 500 mg     500 mg 100 mL/hr over 60 Minutes Intravenous  Once 09/02/17 0145 09/02/17 0502   2017/02/20 2200  piperacillin-tazobactam (ZOSYN) IVPB 3.375 g  Status:  Discontinued     3.375 g 12.5 mL/hr over 240 Minutes Intravenous Every 8 hours 2017/02/20 1907 2017/02/20 2214   2017/02/20 1630  piperacillin-tazobactam (ZOSYN) IVPB 3.375 g     3.375 g 100 mL/hr over 30 Minutes Intravenous  Once 2017/02/20 1628 2017/02/20 2001  09/11/2017 1630  vancomycin (VANCOCIN) IVPB 1000 mg/200 mL premix     1,000 mg 200 mL/hr over 60 Minutes Intravenous  Once 09/25/2017 1628 09/14/2017 2038      Assessment/Plan: s/p Procedure(s): AMPUTATION ABOVE KNEE (Right) Will change dressing tomorrow morning.  Likely [facility] Sunday/Monday  LOS: 8 days    Maureen Taylor, Seraphim Affinito A 09/09/2017

## 2017-09-09 NOTE — Progress Notes (Addendum)
[facility] [place] [facility]   PATIENT NAME: Maureen PieriniJessie Taylor    MR#:  [id number]  DATE OF BIRTH:  [birth year]  SUBJECTIVE:   Boyfriend is at bedside. No acute events overnight. She was having pain earlier this morning and received pain medications  REVIEW OF SYSTEMS:    Review of Systems  Constitutional: Negative for fever, chills weight loss HENT: Negative for ear pain, nosebleeds, congestion, facial swelling, rhinorrhea, neck pain, neck stiffness and ear discharge.   Respiratory: Negative for cough, shortness of breath, wheezing  Cardiovascular: Negative for chest pain, palpitations and leg swelling.  Gastrointestinal: Negative for heartburn, abdominal pain, vomiting, diarrhea or consitpation Genitourinary: Negative for dysuria, urgency, frequency, hematuria Musculoskeletal: Negative for back pain or joint pain Neurological: Negative for dizziness, seizures, syncope, focal weakness,  numbness and headaches.  Hematological: Does not bruise/bleed easily.  Psychiatric/Behavioral: Negative for hallucinations, confusion, dysphoric mood    Tolerating Diet: yes      DRUG ALLERGIES:   Allergies  Allergen Reactions  . Codeine Itching  . Morphine And Related Itching  . Indocin [Indomethacin] Rash    VITALS:  Blood pressure 116/62, pulse (!) 117, temperature (!) 97.5 F (36.4 C), temperature source Oral, resp. rate 19, height 5' (1.524 m), weight 88 kg (194 lb), SpO2 99 %.  PHYSICAL EXAMINATION:  Constitutional: Appears well-developed and well-nourished. No distress. HENT: Normocephalic. Marland Kitchen. Oropharynx is clear and moist.  Eyes: Conjunctivae and EOM are normal. PERRLA, no scleral icterus.  Neck: Normal ROM. Neck supple. No JVD. No tracheal deviation. CVS: RRR, S1/S2 +, no murmurs, no gallops, no carotid bruit.  Pulmonary: Effort and breath sounds normal, no stridor, rhonchi, wheezes, rales.  Abdominal: Soft. BS +,  no distension, tenderness, rebound or  guarding.  Musculoskeletal: Normal range of motion. No edema and no tenderness.  Neuro: Alert. CN 2-12 grossly intact. No focal deficits. Skin: left foot in cushion and right AKA dressed Psychiatric: FLAT affect.      LABORATORY PANEL:   CBC Recent Labs  Lab 09/09/17 0353  WBC 20.4*  HGB 7.8*  HCT 24.5*  PLT 274   ------------------------------------------------------------------------------------------------------------------  Chemistries  Recent Labs  Lab 09/09/17 0353  NA 144  K 4.3  CL 112*  CO2 26  GLUCOSE 160*  BUN 16  CREATININE 1.13*  CALCIUM 9.3   ------------------------------------------------------------------------------------------------------------------  Cardiac Enzymes No results for input(s): TROPONINI in the last 168 hours. ------------------------------------------------------------------------------------------------------------------  RADIOLOGY:  No results found.   ASSESSMENT AND PLAN:   82 year old female with a history of Charcot's joint, diabetes and obesity who presented due to concerns of osteomyelitis.  1. Acute on chronic Osteomyelitis in the posterior calcaneus at the site of a large skin ulceration: Patient is status post right AKA Dressing change planned for tomorrow by Maureen JamaicaVasquez surgery Continue cefepime and vancomycin Probably will discharge on Augmentin and ciprofloxacin when stable for discharge Continue. Pain medications  2. Diabetes: Continue ADA diet with sliding scale  3. Depression: Continue Cymbalta and LAMICTAL  4. Parkinson's disease: Continue Sinemet 5. Acute on chronic anemia: Hemoglobin did drop after surgery. Follow CBC and transfuse hemoglobin less than 7.  Management plans discussed with the patient and she is in agreement.  CODE STATUS: full  TOTAL TIME TAKING CARE OF THIS PATIENT: 30 minutes.     POSSIBLE D/C Sunday or Monday to SNF, DEPENDING ON CLINICAL CONDITION.   Maureen Taylor M.D on  09/09/2017 at 11:30 AM  Between 7am to 6pm - Pager - [phone] After 6pm  go to [email or web address] - password [email or web address]  [facility]  Office  [phone]  CC: Primary care physician; Maureen BasemanBronstein, David, MD  Note: This dictation was prepared with Dragon dictation along with smaller phrase technology. Any transcriptional errors that result from this process are unintentional.

## 2017-09-09 NOTE — Progress Notes (Addendum)
Pharmacy Antibiotic Note  Maureen Taylor is a 82 y.o. female admitted on 09/03/2017 with Osteomyelitis.  Pharmacy has been consulted for vancomycin and cefepime dosing.  Patient was originally started on Vancomycin and Zosyn - However it was reported that patient had redness through out body. Vanc and Zosyn were discontinued and levofloxacin and clinda were started.   MD believes reaction was Redman's Syndrome and had  vancomycin restarted with slower infusion.   Plan: Ke:0.049   T1/2: 14.4   VD: 44  Vancomycin: Continue vancomycin at 1250mg  IV every 24 hours over 3 hours. Calculated trough at Css is 17. Trough level ordered prior to 4th dose. Will monitor renal function and adjust dose as needed.  Continue cefepime 2g IV every 12 hours based on current CrCl <9060ml/min.  12/15 1430: Vancomycin trough= 25 12/15 2230 vanc level 23 Calculated kei 0.01, t1/2 69 hours. Will dose based on serial levels. Next level with AM labs. With trough above goal and elevated creatinine, there is concern for accumulation in this patient. Will check another level in 8 hours and adjust dosing based on calculated kinetic parameters.     Height: 5' (152.4 cm) Weight: 194 lb (88 kg) IBW/kg (Calculated) : 45.5  Temp (24hrs), Avg:98.3 F (36.8 C), Min:97.5 F (36.4 C), Max:99.1 F (37.3 C)  Recent Labs  Lab 09/03/17 0321 09/01/2017 0440 09/05/17 0300 09/08/17 0301 09/09/17 0353 09/09/17 1438  WBC 12.2*  --  9.8 15.2* 20.4*  --   CREATININE 1.02* 0.94 0.82 0.69 1.13*  --   VANCOTROUGH  --   --   --   --   --  25*    Estimated Creatinine Clearance: 39.2 mL/min (A) (by C-G formula based on SCr of 1.13 mg/dL (H)).    Allergies  Allergen Reactions  . Codeine Itching  . Morphine And Related Itching  . Indocin [Indomethacin] Rash    Antimicrobials this admission: Anti-infectives (From admission, onward)   Start     Dose/Rate Route Frequency Ordered Stop   09/05/17 1000  ceFEPIme (MAXIPIME) 2 g in  dextrose 5 % 50 mL IVPB     2 g 100 mL/hr over 30 Minutes Intravenous Every 12 hours 09/05/17 0829     09/05/17 0900  vancomycin (VANCOCIN) 1,250 mg in sodium chloride 0.9 % 250 mL IVPB     1,250 mg 83.3 mL/hr over 180 Minutes Intravenous Every 24 hours 09/05/17 0829     08/28/2017 1800  levofloxacin (LEVAQUIN) IVPB 500 mg  Status:  Discontinued     500 mg 100 mL/hr over 60 Minutes Intravenous Every 24 hours 09/12/2017 1605 09/05/17 0824   09/14/2017 1337  ceFAZolin (ANCEF) 2-4 GM/100ML-% IVPB    Comments:  Talbert ForestBrogan, Terry   : cabinet override      09/14/2017 1337 09/05/17 0144   09/11/2017 1200  ceFAZolin (ANCEF) IVPB 2g/100 mL premix     2 g 200 mL/hr over 30 Minutes Intravenous  Once 09/09/2017 1146 08/29/2017 1401   09/03/17 2200  Levofloxacin (LEVAQUIN) IVPB 250 mg  Status:  Discontinued     250 mg 50 mL/hr over 60 Minutes Intravenous Every 24 hours 09/02/17 0159 09/17/2017 1605   09/02/17 0600  clindamycin (CLEOCIN) IVPB 600 mg  Status:  Discontinued     600 mg 100 mL/hr over 30 Minutes Intravenous Every 8 hours 09/02/17 0145 09/05/17 0824   09/02/17 0200  vancomycin (VANCOCIN) 1,250 mg in sodium chloride 0.9 % 250 mL IVPB  Status:  Discontinued  1,250 mg 166.7 mL/hr over 90 Minutes Intravenous Every 48 hours 08/31/2017 1910 09/13/2017 2214   09/02/17 0200  levofloxacin (LEVAQUIN) IVPB 500 mg     500 mg 100 mL/hr over 60 Minutes Intravenous  Once 09/02/17 0145 09/02/17 0502   09/13/2017 2200  piperacillin-tazobactam (ZOSYN) IVPB 3.375 g  Status:  Discontinued     3.375 g 12.5 mL/hr over 240 Minutes Intravenous Every 8 hours 09/08/2017 1907 09/13/2017 2214   08/31/2017 1630  piperacillin-tazobactam (ZOSYN) IVPB 3.375 g     3.375 g 100 mL/hr over 30 Minutes Intravenous  Once 09/08/2017 1628 08/31/2017 2001   09/14/2017 1630  vancomycin (VANCOCIN) IVPB 1000 mg/200 mL premix     1,000 mg 200 mL/hr over 60 Minutes Intravenous  Once 09/22/2017 1628 08/28/2017 2038      Microbiology results: Recent Results (from  the past 240 hour(s))  Blood Culture (routine x 2)     Status: None   Collection Time: 08/27/2017  5:37 PM  Result Value Ref Range Status   Specimen Description LEFT ANTECUBITAL  Final   Special Requests   Final    BOTTLES DRAWN AEROBIC AND ANAEROBIC Blood Culture adequate volume   Culture NO GROWTH 5 DAYS  Final   Report Status 09/06/2017 FINAL  Final  Blood Culture (routine x 2)     Status: None   Collection Time: 09/13/2017  6:31 PM  Result Value Ref Range Status   Specimen Description BLOOD LEFT ARM  Final   Special Requests   Final    BOTTLES DRAWN AEROBIC AND ANAEROBIC Blood Culture adequate volume   Culture NO GROWTH 5 DAYS  Final   Report Status 09/06/2017 FINAL  Final  MRSA PCR Screening     Status: Abnormal   Collection Time: 08/27/2017 10:10 PM  Result Value Ref Range Status   MRSA by PCR POSITIVE (A) NEGATIVE Final    Comment:        The GeneXpert MRSA Assay (FDA approved for NASAL specimens only), is one component of a comprehensive MRSA colonization surveillance program. It is not intended to diagnose MRSA infection nor to guide or monitor treatment for MRSA infections. RESULT CALLED TO, READ BACK BY AND VERIFIED WITH: [facility] RAYMOND AT 0014 09/02/17.PMH      Thank you for allowing pharmacy to be a part of this patient's care.  Valentina Guhristy, Scott D, PharmD, BCPS Clinical Pharmacist 09/09/2017 3:17 PM

## 2017-09-09 NOTE — Clinical Social Work Note (Signed)
CSW received consult for SNF placement. The patient is an LTC resident at [facility] and has already been assessed and can return. CSW is already following.  Maureen PonderKaren Martha Amias Taylor, MSW, Theresia MajorsLCSWA [phone]

## 2017-09-10 ENCOUNTER — Inpatient Hospital Stay: Payer: Medicare Other

## 2017-09-10 DIAGNOSIS — I959 Hypotension, unspecified: Secondary | ICD-10-CM

## 2017-09-10 DIAGNOSIS — I469 Cardiac arrest, cause unspecified: Secondary | ICD-10-CM

## 2017-09-10 DIAGNOSIS — R092 Respiratory arrest: Secondary | ICD-10-CM

## 2017-09-10 DIAGNOSIS — M869 Osteomyelitis, unspecified: Secondary | ICD-10-CM

## 2017-09-10 LAB — BASIC METABOLIC PANEL
ANION GAP: 6 (ref 5–15)
Anion gap: 7 (ref 5–15)
BUN: 24 mg/dL — AB (ref 6–20)
BUN: 31 mg/dL — ABNORMAL HIGH (ref 6–20)
CALCIUM: 9.2 mg/dL (ref 8.9–10.3)
CALCIUM: 8.3 mg/dL — AB (ref 8.9–10.3)
CO2: 24 mmol/L (ref 22–32)
CO2: 23 mmol/L (ref 22–32)
CREATININE: 1.37 mg/dL — AB (ref 0.44–1.00)
CREATININE: 1.6 mg/dL — AB (ref 0.44–1.00)
Chloride: 115 mmol/L — ABNORMAL HIGH (ref 101–111)
Chloride: 116 mmol/L — ABNORMAL HIGH (ref 101–111)
GFR calc Af Amer: 41 mL/min — ABNORMAL LOW (ref >60)
GFR, EST AFRICAN AMERICAN: 34 mL/min — AB (ref >60)
GFR, EST NON AFRICAN AMERICAN: 35 mL/min — AB (ref >60)
GFR, EST NON AFRICAN AMERICAN: 29 mL/min — AB (ref >60)
GLUCOSE: 158 mg/dL — AB (ref 65–99)
GLUCOSE: 187 mg/dL — AB (ref 65–99)
Potassium: 4.1 mmol/L (ref 3.5–5.1)
Potassium: 3.4 mmol/L — ABNORMAL LOW (ref 3.5–5.1)
Sodium: 146 mmol/L — ABNORMAL HIGH (ref 135–145)
Sodium: 145 mmol/L (ref 135–145)

## 2017-09-10 LAB — TROPONIN I
TROPONIN I: 1.52 ng/mL — AB (ref <0.03)
TROPONIN I: 3.19 ng/mL — AB (ref <0.03)
Troponin I: 2.42 ng/mL (ref <0.03)

## 2017-09-10 LAB — GLUCOSE, CAPILLARY
GLUCOSE-CAPILLARY: 138 mg/dL — AB (ref 65–99)
GLUCOSE-CAPILLARY: 238 mg/dL — AB (ref 65–99)
GLUCOSE-CAPILLARY: 177 mg/dL — AB (ref 65–99)
Glucose-Capillary: 183 mg/dL — ABNORMAL HIGH (ref 65–99)

## 2017-09-10 LAB — CBC
HCT: 22.8 % — ABNORMAL LOW (ref 35.0–47.0)
HCT: 22.7 % — ABNORMAL LOW (ref 35.0–47.0)
Hemoglobin: 7.3 g/dL — ABNORMAL LOW (ref 12.0–16.0)
Hemoglobin: 7.2 g/dL — ABNORMAL LOW (ref 12.0–16.0)
MCH: 28.5 pg (ref 26.0–34.0)
MCH: 28.5 pg (ref 26.0–34.0)
MCHC: 31.8 g/dL — AB (ref 32.0–36.0)
MCHC: 31.6 g/dL — ABNORMAL LOW (ref 32.0–36.0)
MCV: 89.6 fL (ref 80.0–100.0)
MCV: 90 fL (ref 80.0–100.0)
PLATELETS: 380 10*3/uL (ref 150–440)
Platelets: 300 10*3/uL (ref 150–440)
RBC: 2.55 MIL/uL — ABNORMAL LOW (ref 3.80–5.20)
RBC: 2.52 MIL/uL — ABNORMAL LOW (ref 3.80–5.20)
RDW: 16.5 % — AB (ref 11.5–14.5)
RDW: 16.6 % — AB (ref 11.5–14.5)
WBC: 17 10*3/uL — ABNORMAL HIGH (ref 3.6–11.0)
WBC: 15.9 10*3/uL — AB (ref 3.6–11.0)

## 2017-09-10 LAB — VANCOMYCIN, TROUGH: VANCOMYCIN TR: 20 ug/mL (ref 15–20)

## 2017-09-10 LAB — PHOSPHORUS: Phosphorus: 3.8 mg/dL (ref 2.5–4.6)

## 2017-09-10 LAB — MRSA PCR SCREENING: MRSA by PCR: POSITIVE — AB

## 2017-09-10 LAB — MAGNESIUM: MAGNESIUM: 2.2 mg/dL (ref 1.7–2.4)

## 2017-09-10 LAB — ABO/RH: ABO/RH(D): O POS

## 2017-09-10 LAB — PREPARE RBC (CROSSMATCH)

## 2017-09-10 MED ORDER — POTASSIUM CHLORIDE 10 MEQ/50ML IV SOLN
10.0000 meq | INTRAVENOUS | Status: AC
  Administered 2017-09-10 – 2017-09-11 (×3): 10 meq via INTRAVENOUS
  Filled 2017-09-10 (×3): qty 50

## 2017-09-10 MED ORDER — VANCOMYCIN HCL IN DEXTROSE 1-5 GM/200ML-% IV SOLN
1000.0000 mg | INTRAVENOUS | Status: DC
  Filled 2017-09-10: qty 200

## 2017-09-10 MED ORDER — FENTANYL BOLUS VIA INFUSION
25.0000 ug | INTRAVENOUS | Status: DC | PRN
  Administered 2017-09-12 (×5): 25 ug via INTRAVENOUS
  Filled 2017-09-10: qty 25

## 2017-09-10 MED ORDER — ORAL CARE MOUTH RINSE
15.0000 mL | Freq: Four times a day (QID) | OROMUCOSAL | Status: DC
  Administered 2017-09-10 (×2): 15 mL via OROMUCOSAL

## 2017-09-10 MED ORDER — FENTANYL CITRATE (PF) 100 MCG/2ML IJ SOLN
50.0000 ug | INTRAMUSCULAR | Status: DC | PRN
  Administered 2017-09-10: 50 ug via INTRAVENOUS
  Filled 2017-09-10: qty 2

## 2017-09-10 MED ORDER — VANCOMYCIN HCL IN DEXTROSE 1-5 GM/200ML-% IV SOLN
1000.0000 mg | INTRAVENOUS | Status: DC
  Filled 2017-09-10 (×2): qty 200

## 2017-09-10 MED ORDER — FENTANYL 2500MCG IN NS 250ML (10MCG/ML) PREMIX INFUSION
INTRAVENOUS | Status: AC
  Administered 2017-09-10: 200 ug/h via INTRAVENOUS
  Filled 2017-09-10: qty 250

## 2017-09-10 MED ORDER — INSULIN ASPART 100 UNIT/ML ~~LOC~~ SOLN
0.0000 [IU] | SUBCUTANEOUS | Status: DC
  Administered 2017-09-10: 3 [IU] via SUBCUTANEOUS
  Administered 2017-09-10 – 2017-09-11 (×2): 2 [IU] via SUBCUTANEOUS
  Administered 2017-09-11: 1 [IU] via SUBCUTANEOUS
  Administered 2017-09-11: 2 [IU] via SUBCUTANEOUS
  Administered 2017-09-11: 3 [IU] via SUBCUTANEOUS
  Administered 2017-09-11: 2 [IU] via SUBCUTANEOUS
  Administered 2017-09-11: 3 [IU] via SUBCUTANEOUS
  Administered 2017-09-12 (×2): 2 [IU] via SUBCUTANEOUS
  Administered 2017-09-12: 1 [IU] via SUBCUTANEOUS
  Filled 2017-09-10 (×11): qty 1

## 2017-09-10 MED ORDER — IPRATROPIUM-ALBUTEROL 0.5-2.5 (3) MG/3ML IN SOLN: 3.0000 mL | RESPIRATORY_TRACT | Status: DC

## 2017-09-10 MED ORDER — ACETAMINOPHEN 325 MG PO TABS: 650.0000 mg | ORAL_TABLET | ORAL | Status: DC | PRN

## 2017-09-10 MED ORDER — FENTANYL CITRATE (PF) 100 MCG/2ML IJ SOLN
50.0000 ug | Freq: Once | INTRAMUSCULAR | Status: DC
  Filled 2017-09-10: qty 2

## 2017-09-10 MED ORDER — MIDAZOLAM HCL 2 MG/2ML IJ SOLN
1.0000 mg | INTRAMUSCULAR | Status: DC | PRN
  Administered 2017-09-11 (×2): 1 mg via INTRAVENOUS

## 2017-09-10 MED ORDER — SODIUM CHLORIDE 0.45 % IV SOLN
INTRAVENOUS | Status: DC
  Administered 2017-09-10 – 2017-09-11 (×2): via INTRAVENOUS

## 2017-09-10 MED ORDER — NOREPINEPHRINE BITARTRATE 1 MG/ML IV SOLN
0.0000 ug/min | INTRAVENOUS | Status: DC
  Administered 2017-09-10: 10 ug/min via INTRAVENOUS
  Filled 2017-09-10: qty 16

## 2017-09-10 MED ORDER — SODIUM CHLORIDE 0.9 % IV SOLN
Freq: Once | INTRAVENOUS | Status: AC
  Administered 2017-09-10: 22:00:00 via INTRAVENOUS

## 2017-09-10 MED ORDER — MIDAZOLAM HCL 2 MG/2ML IJ SOLN
INTRAMUSCULAR | Status: AC
  Administered 2017-09-10: 2 mg
  Filled 2017-09-10: qty 2

## 2017-09-10 MED ORDER — FENTANYL CITRATE (PF) 100 MCG/2ML IJ SOLN
INTRAMUSCULAR | Status: AC
  Administered 2017-09-10: 100 ug
  Filled 2017-09-10: qty 2

## 2017-09-10 MED ORDER — IPRATROPIUM-ALBUTEROL 0.5-2.5 (3) MG/3ML IN SOLN
3.0000 mL | Freq: Four times a day (QID) | RESPIRATORY_TRACT | Status: DC
  Administered 2017-09-10 – 2017-09-12 (×9): 3 mL via RESPIRATORY_TRACT
  Filled 2017-09-10 (×9): qty 3

## 2017-09-10 MED ORDER — SODIUM CHLORIDE 0.9 % IV BOLUS (SEPSIS)
1000.0000 mL | Freq: Once | INTRAVENOUS | Status: AC
  Administered 2017-09-10: 1000 mL via INTRAVENOUS

## 2017-09-10 MED ORDER — PRO-STAT SUGAR FREE PO LIQD
30.0000 mL | Freq: Two times a day (BID) | ORAL | Status: DC
  Administered 2017-09-10 – 2017-09-12 (×5): 30 mL

## 2017-09-10 MED ORDER — ORAL CARE MOUTH RINSE
15.0000 mL | OROMUCOSAL | Status: DC
  Administered 2017-09-10 – 2017-09-15 (×38): 15 mL via OROMUCOSAL

## 2017-09-10 MED ORDER — ADULT MULTIVITAMIN LIQUID CH
15.0000 mL | Freq: Every day | ORAL | Status: DC
  Administered 2017-09-10 – 2017-09-12 (×3): 15 mL
  Filled 2017-09-10 (×6): qty 15

## 2017-09-10 MED ORDER — POLYETHYLENE GLYCOL 3350 17 G PO PACK: 17.0000 g | PACK | Freq: Every day | ORAL | Status: DC | PRN

## 2017-09-10 MED ORDER — FENTANYL 2500MCG IN NS 250ML (10MCG/ML) PREMIX INFUSION
25.0000 ug/h | INTRAVENOUS | Status: DC
  Administered 2017-09-10 – 2017-09-11 (×2): 200 ug/h via INTRAVENOUS
  Administered 2017-09-11: 30 ug/h via INTRAVENOUS
  Administered 2017-09-11 – 2017-09-12 (×2): 300 ug/h via INTRAVENOUS
  Filled 2017-09-10 (×4): qty 250

## 2017-09-10 MED ORDER — FENTANYL CITRATE (PF) 100 MCG/2ML IJ SOLN: 50.0000 ug | INTRAMUSCULAR | Status: DC | PRN

## 2017-09-10 MED ORDER — PANTOPRAZOLE SODIUM 40 MG IV SOLR
40.0000 mg | INTRAVENOUS | Status: DC
  Administered 2017-09-10 – 2017-09-11 (×2): 40 mg via INTRAVENOUS
  Filled 2017-09-10 (×2): qty 40

## 2017-09-10 MED ORDER — VITAMIN C 500 MG PO TABS
250.0000 mg | ORAL_TABLET | Freq: Two times a day (BID) | ORAL | Status: DC
  Administered 2017-09-10 – 2017-09-12 (×4): 250 mg
  Filled 2017-09-10 (×5): qty 0.5

## 2017-09-10 MED ORDER — CARBIDOPA-LEVODOPA 25-100 MG PO TABS
1.0000 | ORAL_TABLET | Freq: Two times a day (BID) | ORAL | Status: DC
Start: 2017-09-10 — End: 2017-09-12
  Administered 2017-09-10 – 2017-09-12 (×4): 1
  Filled 2017-09-10 (×5): qty 1

## 2017-09-10 MED ORDER — VITAL HIGH PROTEIN PO LIQD
1000.0000 mL | ORAL | Status: DC
  Administered 2017-09-10 – 2017-09-12 (×4): 1000 mL

## 2017-09-10 MED ORDER — SODIUM CHLORIDE 0.9 % IV BOLUS (SEPSIS)
500.0000 mL | Freq: Once | INTRAVENOUS | Status: AC
  Administered 2017-09-10: 500 mL via INTRAVENOUS

## 2017-09-10 MED ORDER — MIDAZOLAM HCL 2 MG/2ML IJ SOLN
1.0000 mg | INTRAMUSCULAR | Status: DC | PRN
  Filled 2017-09-10: qty 2

## 2017-09-10 MED ORDER — CHLORHEXIDINE GLUCONATE 0.12% ORAL RINSE (MEDLINE KIT)
15.0000 mL | Freq: Two times a day (BID) | OROMUCOSAL | Status: DC
  Administered 2017-09-10 – 2017-09-13 (×5): 15 mL via OROMUCOSAL

## 2017-09-10 MED ORDER — STERILE WATER FOR INJECTION IJ SOLN
INTRAMUSCULAR | Status: AC
  Administered 2017-09-10: 10 mL
  Filled 2017-09-10: qty 10

## 2017-09-10 MED ORDER — FOLIC ACID 1 MG PO TABS
1.0000 mg | ORAL_TABLET | Freq: Every day | ORAL | Status: DC
  Administered 2017-09-11 – 2017-09-12 (×2): 1 mg
  Filled 2017-09-10 (×2): qty 1

## 2017-09-10 MED ORDER — MIDAZOLAM HCL 2 MG/2ML IJ SOLN
1.0000 mg | INTRAMUSCULAR | Status: DC | PRN
  Administered 2017-09-10: 1 mg via INTRAVENOUS

## 2017-09-10 MED ORDER — CHLORHEXIDINE GLUCONATE 0.12% ORAL RINSE (MEDLINE KIT)
15.0000 mL | Freq: Two times a day (BID) | OROMUCOSAL | Status: DC
  Administered 2017-09-10: 15 mL via OROMUCOSAL

## 2017-09-10 MED ORDER — MIDAZOLAM HCL 2 MG/2ML IJ SOLN
1.0000 mg | INTRAMUSCULAR | Status: DC | PRN
  Filled 2017-09-10 (×2): qty 2

## 2017-09-10 MED ORDER — FUROSEMIDE 10 MG/ML IJ SOLN: 20.0000 mg | Freq: Once | INTRAMUSCULAR | Status: DC

## 2017-09-10 NOTE — Progress Notes (Signed)
Initial Nutrition Assessment  DOCUMENTATION CODES:   Obesity unspecified  INTERVENTION:  Recommend Vital High Protein at 40 mL/hr + Pro-Stat 30 mL BID per OGT. Provides 1160 kcal, 114 grams of protein, 806 mL H2O daily.  Provide liquid MVI daily per tube as goal tube feed rate does not meet 100% RDIs for vitamins/minerals.  NUTRITION DIAGNOSIS:   Inadequate oral intake related to inability to eat as evidenced by NPO status.  GOAL:   Provide needs based on ASPEN/SCCM guidelines  MONITOR:   Vent status, Labs, Weight trends, TF tolerance, Skin, I & O's  REASON FOR ASSESSMENT:   Ventilator, Consult Enteral/tube feeding initiation and management  ASSESSMENT:   82 year old female with PMHx of magnesium deficiency, ascorbic acid deficiency, slow transit constipation, GERD, MDD, Charcot's joint, DM type 2, neuropathy, HTN, who is bed/wheel-chair bound. Patient was admitted on 12/7 with acute on chronic osteomyelitis of right heel with PVD s/p right lower extremity revascularization 12/10 and right AKA on 12/13 for right foot gangrene. Code Blue was called 12/16 for agonal breathing/respiratory distress and patient subsequently suffered cardiac arrest, underwent 10 minutes of chest compressions, and was emergently intubated.   No family members present at time of RD assessment. Per weight history in chart patient previously weighed 236 lbs mst of this past year. She was 236.8 lbs on 02/08/2017, 211.2 lbs on 07/20/2017, 199.6 lbs on admission on 09/18/2017, and is currently 186.95 lbs on 09/10/2017 (first weight s/p right AKA). From 5/6 to 12/7 patient lost 37.2 lbs (15.7% body weight) over 7 months, which is significant for time frame.  Access: OGT placed 12/16; terminates in abdomen per chest x-ray from 12/16; details of tube placement not yet documented so unsure of french size or cm marking at corner of mouth  MAP: 50-73 mmHg; 50 mmHg was taken shortly after code blue; patient appears to  have stabilized now but will continue to monitor  Patient is currently intubated on ventilator support MV: 7.4 L/min Temp (24hrs), Avg:99.6 F (37.6 C), Min:98.3 F (36.8 C), Max:100.4 F (38 C)  Propofol: N/A  Medications reviewed and include: folic acid 1 mg daily per tube, Novolog 0-9 units Q4hrs, pantoprazole, vitamin C 250 mg BID, 1/2 NS at 100 mL/hr, cefepime, Levophed (currently at 7.5 mL/hr), vancomycin.  Labs reviewed: CBG 138-183, Sodium 146, Chloride 115, BUN 24, Creatinine 1.37.  Patient is at risk for malnutrition in setting of significant weight loss PTA. Will continue to monitor.  Discussed with RN.  NUTRITION - FOCUSED PHYSICAL EXAM:    Most Recent Value  Orbital Region  No depletion  Upper Arm Region  No depletion  Thoracic and Lumbar Region  No depletion  Buccal Region  Unable to assess  Temple Region  No depletion  Clavicle Bone Region  No depletion  Clavicle and Acromion Bone Region  No depletion  Scapular Bone Region  Unable to assess  Dorsal Hand  No depletion  Patellar Region  Mild depletion  Anterior Thigh Region  Mild depletion  Posterior Calf Region  Moderate depletion  Edema (RD Assessment)  None  Hair  Reviewed  Eyes  Unable to assess  Mouth  Unable to assess  Skin  Reviewed [ecchymosis]  Nails  Reviewed     Diet Order:  No diet orders on file  EDUCATION NEEDS:   No education needs have been identified at this time  Skin:  Skin Assessment: Skin Integrity Issues: Skin Integrity Issues:: Stage III, Incisions, Other (Comment) Stage III: left heel  Incisions: closed incision right leg Other: MSAD buttocks, open wound buttocks, scattered ecchymosis  Last BM:  09/23/2017 - small type 3  Height:   Ht Readings from Last 1 Encounters:  09/10/17 5\' 5"  (1.651 m)    Weight:   Wt Readings from Last 1 Encounters:  09/10/17 186 lb 15.2 oz (84.8 kg)    Ideal Body Weight:  56.8 kg  BMI:  Body mass index is 31.11 kg/m.  Estimated  Nutritional Needs:   Kcal:  [phone] (11-14 kcal/kg)  Protein:  >/= 114 grams (>/= 2 grams/kg IBW)  Fluid:  1.4 L/day (25 mL/kg IBW)  Helane RimaLeanne Dreyden Rohrman, MS, RD, LDN Office: [phone] Pager: [phone] After Hours/Weekend Pager: [phone]

## 2017-09-10 NOTE — Progress Notes (Signed)
Patieny BP 87/43.. MD paged.

## 2017-09-10 NOTE — Consult Note (Signed)
PULMONARY / CRITICAL CARE MEDICINE   Name: Maureen Taylor MRN: [id number] DOB: 1935-08-08    ADMISSION DATE:  09/03/2017 CONSULTATION DATE: 12/16  REFERRING MD:  Juliene Pina  PT PROFILE: 63 F adm from SNF Sep 15, 2017 to initiate IV abx for acute worsening of chronic RLE osteomyelitis after failing treatment with oral abx. Underwent R AKA 09/12/2017. Suffered respiratory > cardiac arrest AM 09/10/17 with approx 10 mins chest compressions and requiring intubation, 2 amps epinephrine. Initial rhythm was sinus tachycardia but was obtained after epinephrine and pulse was restored. Made DNR in event of recurrent cardiac arrest after transfer to ICU  MAJOR EVENTS/TEST RESULTS: 12/07 Admission as documented above 12/08 Podiatry consultation: rec MRI and vascular surgery consultation 12/08 Vascular Surgery consultation: "The patient would benefit from undergoing a right lower extremity angiogram with possible intervention to assess the patient's anatomy and contributing peripheral artery disease." 12/08 MRI R foot/ankle: Osteomyelitis in the posterior calcaneus at the site of a large skin ulceration. The Achilles tendon is intact and unremarkable. Negative for abscess or septic joint 12/09 RLE venous US: No evidence of deep venous thrombosis 12/10 Aortogram and selective RLE angiogram. Aortogram and selective right lower extremity angiogram. Percutaneous transluminal angioplasty of right peroneal artery and tibioperoneal trunk, right popliteal artery, right anterior tibial artery  12/13 R AKA 12/16 initially noted to be in respiratory distress. Subsequently suffered cardiac arrest as documented above.    INDWELLING DEVICES:: ETT 12/16 >>  R [place] CVL 12/16 >>   MICRO DATA: MRSA PCR 12/07 >> POS Blood 12/07 >> NEG Resp 12/16 >>    ANTIMICROBIALS:  Vancomycin 12/07 >>  Pip-tazo 12/07 >> 12/08 Levofloxacin 12/08 >> 12/11 Cefepime 12/11 >>   PAST MEDICAL HISTORY :  She  has a past medical history of  Acute encephalopathy, Allergic rhinitis, Ascorbic acid deficiency, Bipolar affective disorder (HCC), Candidiasis, Charcot's joint, Chronic constipation, Dysuria, GERD (gastroesophageal reflux disease), History of falling, HLD (hyperlipidemia), HTN (hypertension), Magnesium deficiency, Major depressive disorder, Mixed incontinence, Morbid obesity (HCC), Muscle weakness, Neuropathic pain, Neuropathy, OAB (overactive bladder), Obesity, Oral pain, Parkinson disease (HCC), Pneumonia, Protein calorie malnutrition (HCC), Rosacea, Sepsis (HCC) (09/2016), Slow transit constipation, Type 2 diabetes mellitus with diabetic polyneuropathy, with long-term current use of insulin (HCC), Urinary tract infection without hematuria, Vaginal atrophy, and Vitamin deficiency.  PAST SURGICAL HISTORY: She  has a past surgical history that includes Cholecystectomy; Abdominal hysterectomy; Revision total knee arthroplasty (Right); Cervical conization w/bx; Lower Extremity Angiography (N/A, 09/14/2017); LOWER EXTREMITY INTERVENTION (09/25/2017); and Amputation (Right, 09/02/2017).  Allergies  Allergen Reactions  . Codeine Itching  . Morphine And Related Itching  . Indocin [Indomethacin] Rash    No current facility-administered medications on file prior to encounter.    Current Outpatient Medications on File Prior to Encounter  Medication Sig  . acetaminophen (TYLENOL) 500 MG tablet Take 1,000 mg by mouth every 8 (eight) hours as needed.  . ARIPiprazole (ABILIFY) 5 MG tablet Take 2.5 mg by mouth 2 (two) times daily.   Marland Kitchen ascorbic acid (VITAMIN C) 250 MG tablet Take 250 mg by mouth 2 (two) times daily.  Marland Kitchen aspirin 81 MG EC tablet Take 81 mg by mouth daily. Swallow whole.  Marland Kitchen atorvastatin (LIPITOR) 20 MG tablet Take 20 mg by mouth daily.   . carbidopa-levodopa (SINEMET IR) 25-100 MG tablet Take 1 tablet by mouth 2 (two) times daily.   . carboxymethylcellulose 1 % ophthalmic solution Apply 1 drop to eye 3 (three) times daily.    . Cranberry 425 MG  CAPS Take 1 capsule by mouth daily.   Marland Kitchen. docusate sodium (COLACE) 100 MG capsule Take 1 capsule (100 mg total) by mouth 2 (two) times daily.  . DULoxetine (CYMBALTA) 60 MG capsule Take 60 mg by mouth daily.  . folic acid (FOLVITE) 1 MG tablet Take 1 mg by mouth. Saturday through Thursday  . furosemide (LASIX) 40 MG tablet Take 1 tablet (40 mg total) by mouth daily.  Marland Kitchen. gabapentin (NEURONTIN) 100 MG capsule Take 200 mg by mouth at bedtime.   . insulin glargine (LANTUS) 100 UNIT/ML injection Inject 0.35 mLs (35 Units total) into the skin 2 (two) times daily. (Patient taking differently: Inject 25 Units into the skin daily. )  . insulin lispro (HUMALOG) 100 UNIT/ML injection Inject 2-12 Units into the skin See admin instructions. Sliding Scale  . lamoTRIgine (LAMICTAL) 100 MG tablet Take 100 mg by mouth 2 (two) times daily.   Marland Kitchen. linaclotide (LINZESS) 145 MCG CAPS capsule Take 145 mcg by mouth daily before breakfast.  . lisinopril (PRINIVIL,ZESTRIL) 20 MG tablet Take 20 mg by mouth daily.   . Magnesium 400 MG CAPS Take 1 capsule by mouth daily.  . methotrexate (RHEUMATREX) 2.5 MG tablet Take 15 mg by mouth every Friday.  . metoprolol tartrate (LOPRESSOR) 25 MG tablet Take 25 mg by mouth 2 (two) times daily. Take 25 mg by mouth twice a day for HTN  . multivitamin-lutein (OCUVITE-LUTEIN) CAPS capsule Take 1 capsule by mouth daily.  . pantoprazole (PROTONIX) 40 MG tablet Take 40 mg by mouth daily.   . polyethylene glycol (MIRALAX / GLYCOLAX) packet Take 17 g by mouth 2 (two) times daily.  . sennosides-docusate sodium (SENOKOT-S) 8.6-50 MG tablet Take 2 tablets by mouth 2 (two) times daily.  Marland Kitchen. alum & mag hydroxide-simeth (MAALOX/MYLANTA) 200-200-20 MG/5ML suspension Take 30 mLs by mouth every 6 (six) hours as needed for indigestion or heartburn.  . bisacodyl (DULCOLAX) 10 MG suppository Place 1 suppository (10 mg total) rectally daily as needed for moderate constipation.  . conjugated  estrogens (PREMARIN) vaginal cream Place 1 Applicatorful vaginally every Monday, Wednesday, and Friday. Apply 0.5mg  (pea-sized amount)  just inside the vaginal introitus with a finger-tip every Monday, Wednesday and Friday nights.  . feeding supplement, ENSURE ENLIVE, (ENSURE ENLIVE) LIQD Take 237 mLs by mouth 2 (two) times daily between meals.  Marland Kitchen. ipratropium-albuterol (DUONEB) 0.5-2.5 (3) MG/3ML SOLN Take 3 mLs by nebulization every 4 (four) hours as needed (wheezing).  . predniSONE (STERAPRED UNI-PAK 21 TAB) 10 MG (21) TBPK tablet Take 6 tabs first day, 5 tab on day 2, then 4 on day 3rd, 3 tabs on day 4th , 2 tab on day 5th, and 1 tab on 6th day. (Patient not taking: Reported on 09/13/2017)    FAMILY HISTORY:  Her indicated that the status of her father is unknown. She indicated that the status of her sister is unknown. She indicated that the status of her neg hx is unknown.   SOCIAL HISTORY: She  reports that  has never smoked. she has never used smokeless tobacco. She reports that she does not drink alcohol or use drugs.  REVIEW OF SYSTEMS:   Level 5 caveat  SUBJECTIVE:    VITAL SIGNS: BP (!) 73/40   Pulse (!) 127   Temp 99.7 F (37.6 C) (Axillary)   Resp (!) 25   Ht 5\' 5"  (1.651 m)   Wt 84.8 kg (186 lb 15.2 oz)   SpO2 97%   BMI 31.11 kg/m  HEMODYNAMICS:    VENTILATOR SETTINGS: Vent Mode: PRVC FiO2 (%):  [40 %-60 %] 40 % Set Rate:  [15 bmp] 15 bmp Vt Set:  [500 mL] 500 mL PEEP:  [5 cmH20] 5 cmH20  INTAKE / OUTPUT: I/O last 3 completed shifts: In: -  Out: 250 [Urine:250]  PHYSICAL EXAMINATION: General: Unresponsive, intubated Neuro: Minimal withdrawal of all 4 extremities, CNs intact HEENT: NCAT, sclerae white Cardiovascular: Tachycardic, regular with extrasystoles, no M Lungs: R >L rhonchi Abdomen: Obese, soft, diminished BS Ext: S/P R AKA with surgical dressing, no LLE edema, nonpalpable pulses, cool Skin: unstaged sacral decub ulcer  LABS:  BMET Recent  Labs  Lab 09/08/17 0301 09/09/17 0353 09/10/17 0638  NA 141 144 146*  K 4.1 4.3 4.1  CL 109 112* 115*  CO2 25 26 24   BUN 13 16 24*  CREATININE 0.69 1.13* 1.37*  GLUCOSE 168* 160* 158*    Electrolytes Recent Labs  Lab 09/08/17 0301 09/09/17 0353 09/10/17 0638  CALCIUM 9.0 9.3 9.2    CBC Recent Labs  Lab 09/08/17 0301 09/09/17 0353 09/10/17 0638  WBC 15.2* 20.4* 17.0*  HGB 8.7* 7.8* 7.3*  HCT 26.9* 24.5* 22.8*  PLT 323 274 300    Coag's No results for input(s): APTT, INR in the last 168 hours.  Sepsis Markers No results for input(s): LATICACIDVEN, PROCALCITON, O2SATVEN in the last 168 hours.  ABG Recent Labs  Lab 09/10/17 1030  PHART 7.35  PCO2ART 35  PO2ART 121*    Liver Enzymes No results for input(s): AST, ALT, ALKPHOS, BILITOT, ALBUMIN in the last 168 hours.  Cardiac Enzymes No results for input(s): TROPONINI, PROBNP in the last 168 hours.  Glucose Recent Labs  Lab 09/09/17 1122 09/09/17 1409 09/09/17 1634 09/09/17 2144 09/10/17 0718 09/10/17 1014  GLUCAP 175* 193* 151* 139* 138* 183*    CXR: Ill-defined, patchy R > L opacities EKG 12/16: sinus tach with freq PVCs, NSST changes   ASSESSMENT / PLAN:  PULMONARY A: Acute respiratory failure Concern for aspiration pneumonia P:   Vent settings established Vent bundle implemented Daily SBT as indicated  CARDIOVASCULAR A:  S/P cardiac arrest 12/16 -initial rhythm unknown, 10 mins CPR Hypotension, sepsis and/or hypovolemia Elevated Trop I - NSTEMI vs demand ischemia P:  NS bolus ordered NE infusion ordered as needed to maintain MAP > 65 mmHg Continue to cycle cardiac markers  RENAL A:   AKI Hypernatremia P:   Monitor BMET intermittently Monitor I/Os Correct electrolytes as indicated  IVF's adjusted Per GOC discussion 12/16, not a candidate for HD  GASTROINTESTINAL A:   Recent protein-calorie malnutrition P:   SUP: IV PPI TF protocol initiated  12/16  HEMATOLOGIC A:   Hospital acquired anemia without evidence of acute blood loss P:  DVT px: SQ heparin Monitor CBC intermittently Transfuse per usual guidelines   INFECTIOUS A:   Severe sepsis Chronic osteomyelitis, RLE Suspected aspiration pneumonia P:   Monitor temp, WBC count Micro and abx as above   ENDOCRINE A:   DM 2, controlled P:   SSI changed to every 4 hours  NEUROLOGIC A:   Acute encephalopathy ICU/ventilator associated discomfort P:   RASS goal: -1, -2 PAD protocol - intermittent fentanyl, midazolam   FAMILY  - Updates: 2 daughters and significant other updated in detail.  We discussed goals of care.  The patient had recently expressed a desire "just die in peace", however, also had indicated that she would wish to undergo intubation for a short time if  needed to survive this illness.  I conveyed a very guarded prognosis given the critical nature of her illness.  We all agreed to DNR in event of recurrent cardiac arrest.  We agreed that she is not a candidate for dialysis of any form or duration in the event of progressive renal failure.  We agreed that the duration of our support would be relatively brief (not more than 5-7 days) with a daily reassessment of status and response to our therapies.  She is not a candidate for tracheostomy tube or long-term mechanical ventilation.  We agreed that we will not reintubate if she fails extubation when that time comes.   CCM time: 60 mins  The above time includes time spent in consultation with patient and/or family members and reviewing care plan on multidisciplinary rounds  Billy Fischeravid Morine Kohlman, MD PCCM service Mobile [phone] Pager [phone]      09/10/2017, 12:48 PM

## 2017-09-10 NOTE — Progress Notes (Signed)
Per Dr. Sung AmabileSimonds, OG ok to use

## 2017-09-10 NOTE — Progress Notes (Signed)
MD returned page. Order received to give a bolus

## 2017-09-10 NOTE — Progress Notes (Signed)
Pharmacy Antibiotic Note  Maureen Taylor is a 82 y.o. female admitted on 08/31/2017 with Osteomyelitis.  Pharmacy has been consulted for vancomycin and cefepime dosing.  Patient was originally started on Vancomycin and Zosyn - However it was reported that patient had redness through out body. Vanc and Zosyn were discontinued and levofloxacin and clinda were started.  MD believes reaction was Redman's Syndrome and had  vancomycin restarted with slower infusion.  S/p R AKA  Plan: Ke:0.049   T1/2: 14.4   VD: 44  -Vancomycin: Continue vancomycin at 1250mg  IV every 24 hours over 3 hours. Calculated trough at Css is 17. Trough level ordered prior to 4th dose. Will monitor renal function and adjust dose as needed.  -Continue cefepime 2g IV every 12 hours based on current CrCl <260ml/min.  12/15 1430: Vancomycin trough= 25 12/15 2230 vanc level 23 Calculated kei 0.01, t1/2 69 hours. Will dose based on serial levels. Next level with AM labs. With trough above goal and elevated creatinine, there is concern for accumulation in this patient. Will check another level in 8 hours and adjust dosing based on calculated kinetic parameters.   12/16 Vanc level @ 0638 = 20 mcg/ml. Scr 1.37.  Goal trough 15-20 mcg/ml. KE 0.024  T1/2 28.8  Vd 43.8    AdjBW= 62.5 kg Will order Vancomycin 1000mg  IV q36h.  Vanc trough scheduled for 12/19 at 0930. Obese patient - watch renal fxn and possibility of accumulation.     Height: 5' (152.4 cm) Weight: 194 lb (88 kg) IBW/kg (Calculated) : 45.5  Temp (24hrs), Avg:99 F (37.2 C), Min:98 F (36.7 C), Max:100.2 F (37.9 C)  Recent Labs  Lab 09/06/2017 0440 09/05/17 0300 09/08/17 0301 09/09/17 0353  09/09/17 2216 09/10/17 0638  WBC  --  9.8 15.2* 20.4*  --   --  17.0*  CREATININE 0.94 0.82 0.69 1.13*  --   --  1.37*  VANCOTROUGH  --   --   --   --    < > 23* 20   < > = values in this interval not displayed.    Estimated Creatinine Clearance: 32.3 mL/min (A)  (by C-G formula based on SCr of 1.37 mg/dL (H)).    Allergies  Allergen Reactions  . Codeine Itching  . Morphine And Related Itching  . Indocin [Indomethacin] Rash    Antimicrobials this admission: Anti-infectives (From admission, onward)   Start     Dose/Rate Route Frequency Ordered Stop   09/10/17 1000  vancomycin (VANCOCIN) IVPB 1000 mg/200 mL premix     1,000 mg 200 mL/hr over 60 Minutes Intravenous Every 36 hours 09/10/17 0927     09/05/17 1000  ceFEPIme (MAXIPIME) 2 g in dextrose 5 % 50 mL IVPB     2 g 100 mL/hr over 30 Minutes Intravenous Every 12 hours 09/05/17 0829     09/05/17 0900  vancomycin (VANCOCIN) 1,250 mg in sodium chloride 0.9 % 250 mL IVPB  Status:  Discontinued     1,250 mg 83.3 mL/hr over 180 Minutes Intravenous Every 24 hours 09/05/17 0829 09/09/17 1518   09/14/2017 1800  levofloxacin (LEVAQUIN) IVPB 500 mg  Status:  Discontinued     500 mg 100 mL/hr over 60 Minutes Intravenous Every 24 hours 09/24/2017 1605 09/05/17 0824   08/27/2017 1337  ceFAZolin (ANCEF) 2-4 GM/100ML-% IVPB    Comments:  Talbert ForestBrogan, Terry   : cabinet override      09/11/2017 1337 09/05/17 0144   09/24/2017 1200  ceFAZolin (ANCEF)  IVPB 2g/100 mL premix     2 g 200 mL/hr over 30 Minutes Intravenous  Once 09/17/2017 1146 09/22/2017 1401   09/03/17 2200  Levofloxacin (LEVAQUIN) IVPB 250 mg  Status:  Discontinued     250 mg 50 mL/hr over 60 Minutes Intravenous Every 24 hours 09/02/17 0159 09/08/2017 1605   09/02/17 0600  clindamycin (CLEOCIN) IVPB 600 mg  Status:  Discontinued     600 mg 100 mL/hr over 30 Minutes Intravenous Every 8 hours 09/02/17 0145 09/05/17 0824   09/02/17 0200  vancomycin (VANCOCIN) 1,250 mg in sodium chloride 0.9 % 250 mL IVPB  Status:  Discontinued     1,250 mg 166.7 mL/hr over 90 Minutes Intravenous Every 48 hours 08/31/2017 1910 09/10/2017 2214   09/02/17 0200  levofloxacin (LEVAQUIN) IVPB 500 mg     500 mg 100 mL/hr over 60 Minutes Intravenous  Once 09/02/17 0145 09/02/17 0502    09/12/2017 2200  piperacillin-tazobactam (ZOSYN) IVPB 3.375 g  Status:  Discontinued     3.375 g 12.5 mL/hr over 240 Minutes Intravenous Every 8 hours 09/14/2017 1907 08/29/2017 2214   08/28/2017 1630  piperacillin-tazobactam (ZOSYN) IVPB 3.375 g     3.375 g 100 mL/hr over 30 Minutes Intravenous  Once 09/14/2017 1628 08/29/2017 2001   09/17/2017 1630  vancomycin (VANCOCIN) IVPB 1000 mg/200 mL premix     1,000 mg 200 mL/hr over 60 Minutes Intravenous  Once 09/09/2017 1628 09/12/2017 2038      Microbiology results: Recent Results (from the past 240 hour(s))  Blood Culture (routine x 2)     Status: None   Collection Time: 09/16/2017  5:37 PM  Result Value Ref Range Status   Specimen Description LEFT ANTECUBITAL  Final   Special Requests   Final    BOTTLES DRAWN AEROBIC AND ANAEROBIC Blood Culture adequate volume   Culture NO GROWTH 5 DAYS  Final   Report Status 09/06/2017 FINAL  Final  Blood Culture (routine x 2)     Status: None   Collection Time: 09/18/2017  6:31 PM  Result Value Ref Range Status   Specimen Description BLOOD LEFT ARM  Final   Special Requests   Final    BOTTLES DRAWN AEROBIC AND ANAEROBIC Blood Culture adequate volume   Culture NO GROWTH 5 DAYS  Final   Report Status 09/06/2017 FINAL  Final  MRSA PCR Screening     Status: Abnormal   Collection Time: 08/29/2017 10:10 PM  Result Value Ref Range Status   MRSA by PCR POSITIVE (A) NEGATIVE Final    Comment:        The GeneXpert MRSA Assay (FDA approved for NASAL specimens only), is one component of a comprehensive MRSA colonization surveillance program. It is not intended to diagnose MRSA infection nor to guide or monitor treatment for MRSA infections. RESULT CALLED TO, READ BACK BY AND VERIFIED WITH: [facility] RAYMOND AT 0014 09/02/17.PMH      Thank you for allowing pharmacy to be a part of this patient's care.  Angelique BlonderMerrill,Talyn Eddie A, PharmD, BCPS Clinical Pharmacist 09/10/2017 9:27 AM

## 2017-09-10 NOTE — Progress Notes (Signed)
Patient coded- cardiac arrest this morning and transferred to ICU in guarded condition prior to my arrival.  Patient being stabilized. Will change AKA dressing tomorrow.

## 2017-09-10 NOTE — Progress Notes (Addendum)
Was called to patient's room by boyfriend, BurtonBen. Patient's breathing was labored. Advised Dr. Juliene PinaMody, who was still on the floor. She asked this nurse to call RT for STAT ABG which she did.  Dr. Juliene PinaMody ordered Lasix, STAT chest xray and CT scan however remained incomplete at that time d/t patient's rapid decline. Last BP that this nurse observed was 70/54 and pulse 21 and patients condition rapidly declined from there. Patient had agonal breathing and no pulse. CPR Immediately started and Code Blue called at 0940. Patient transferred to CCU at 1000.

## 2017-09-10 NOTE — Progress Notes (Signed)
[facility] [place] [facility]   PATIENT NAME: Maureen Taylor    MR#:  [id number]  DATE OF BIRTH:  [birth year]  SUBJECTIVE:   Patient with agonal respirations this morning. Patient appeared very pale. CODE BLUE called.  REVIEW OF SYSTEMS:    Unable to obtain   Tolerating Diet: not eating much per nurse report  DRUG ALLERGIES:   Allergies  Allergen Reactions  . Codeine Itching  . Morphine And Related Itching  . Indocin [Indomethacin] Rash    VITALS:  Blood pressure (!) 106/46, pulse (!) 139, temperature 99.7 F (37.6 C), temperature source Axillary, resp. rate (!) 21, height 5\' 5"  (1.651 m), weight 84.8 kg (186 lb 15.2 oz), SpO2 100 %.  PHYSICAL EXAMINATION:  Constitutional: Appears ill appearing Agonal breathing , pale Eyes: Conjunctivae and EOM are normal. PERRLA, no scleral icterus.  Neck:  Neck supple. No JVD. No tracheal deviation. CVS: tachy S1/S2 +, no murmurs, no gallops, no carotid bruit.  Pulmonary: agonal breathing with bilateral rhonchi  Abdominal: Soft. BS +,  no distension, tenderness, rebound or guarding.  .  Neuro: Not following commands lethargic Skin: left foot in cushion and right AKA dressed Psychiatric: Lethargic    LABORATORY PANEL:   CBC Recent Labs  Lab 09/10/17 0638  WBC 17.0*  HGB 7.3*  HCT 22.8*  PLT 300   ------------------------------------------------------------------------------------------------------------------  Chemistries  Recent Labs  Lab 09/10/17 0638  NA 146*  K 4.1  CL 115*  CO2 24  GLUCOSE 158*  BUN 24*  CREATININE 1.37*  CALCIUM 9.2   ------------------------------------------------------------------------------------------------------------------  Cardiac Enzymes No results for input(s): TROPONINI in the last 168 hours. ------------------------------------------------------------------------------------------------------------------  RADIOLOGY:  No results  found.   ASSESSMENT AND PLAN:   82 year old female with a history of Charcot's joint, diabetes and obesity who presented due to concerns of osteomyelitis.  1. CODE BLUE status post respiratory/cardiac arrest (no pulses): I was present for initial CODE BLUE then Dr Sung AmabileSimonds ran CODE blue She received EPI and was intubated and transferred to ICU. Likely aspiration component to resp arrest  Management as per intensivist.  2. Acute on chronic Osteomyelitis in the posterior calcaneus at the site of a large skin ulceration: Patient is status post right AKA Dressing change planned for tomorrow by vascular  Continue cefepime and vancomycin  3. Diabetes: Continue sliding scale  4. Depression:  Cymbalta and LAMICTAL  5. Parkinson's disease:  Sinemet 6. Acute on chronic anemia: Aortic lumen 7.3 postoperative acute blood loss. Recommend transfusing 1 unit today  Management plans discussed with the patient's daughter and Dr. Sung AmabileSimonds  Overall poor prognosis Consider palliative care consult  CODE STATUS: full  CRITICAL CARE TOTAL TIME TAKING CARE OF THIS PATIENT: 38 minutes.     POSSIBLE D/C ??    Maureen Taylor M.D on 09/10/2017 at 11:17 AM  Between 7am to 6pm - Pager - [phone] After 6pm go to [email or web address] - password [email or web address]  [facility]  Office  [phone]  CC: Primary care physician; Dorothey BasemanBronstein, David, MD  Note: This dictation was prepared with Dragon dictation along with smaller phrase technology. Any transcriptional errors that result from this process are unintentional.

## 2017-09-10 NOTE — Progress Notes (Signed)
Patient intubated by Dr. Owens Shark, ER physician, with a #4 mac blade and #7.5 subglottic oral ET tube on the first attempt. Positive color change noted on capnometer, Bilateral breath sounds auscultated by MD, and bilateral chest rise noted. Tube secured at 22cm at the lip with commercial tube holder.

## 2017-09-11 ENCOUNTER — Inpatient Hospital Stay: Payer: Medicare Other

## 2017-09-11 DIAGNOSIS — J9601 Acute respiratory failure with hypoxia: Secondary | ICD-10-CM

## 2017-09-11 LAB — GLUCOSE, CAPILLARY
GLUCOSE-CAPILLARY: 153 mg/dL — AB (ref 65–99)
GLUCOSE-CAPILLARY: 213 mg/dL — AB (ref 65–99)
Glucose-Capillary: 137 mg/dL — ABNORMAL HIGH (ref 65–99)
Glucose-Capillary: 159 mg/dL — ABNORMAL HIGH (ref 65–99)
Glucose-Capillary: 198 mg/dL — ABNORMAL HIGH (ref 65–99)
Glucose-Capillary: 228 mg/dL — ABNORMAL HIGH (ref 65–99)
Glucose-Capillary: 241 mg/dL — ABNORMAL HIGH (ref 65–99)

## 2017-09-11 LAB — SURGICAL PATHOLOGY

## 2017-09-11 LAB — MAGNESIUM: Magnesium: 2 mg/dL (ref 1.7–2.4)

## 2017-09-11 LAB — BASIC METABOLIC PANEL
ANION GAP: 6 (ref 5–15)
BUN: 34 mg/dL — ABNORMAL HIGH (ref 6–20)
CO2: 23 mmol/L (ref 22–32)
Calcium: 8.2 mg/dL — ABNORMAL LOW (ref 8.9–10.3)
Chloride: 117 mmol/L — ABNORMAL HIGH (ref 101–111)
Creatinine, Ser: 1.59 mg/dL — ABNORMAL HIGH (ref 0.44–1.00)
GFR calc Af Amer: 34 mL/min — ABNORMAL LOW (ref >60)
GFR, EST NON AFRICAN AMERICAN: 30 mL/min — AB (ref >60)
Glucose, Bld: 169 mg/dL — ABNORMAL HIGH (ref 65–99)
POTASSIUM: 3.4 mmol/L — AB (ref 3.5–5.1)
SODIUM: 146 mmol/L — AB (ref 135–145)

## 2017-09-11 LAB — CBC
HEMATOCRIT: 24.3 % — AB (ref 35.0–47.0)
HEMOGLOBIN: 7.9 g/dL — AB (ref 12.0–16.0)
MCH: 29.5 pg (ref 26.0–34.0)
MCHC: 32.5 g/dL (ref 32.0–36.0)
MCV: 90.7 fL (ref 80.0–100.0)
Platelets: 322 10*3/uL (ref 150–440)
RBC: 2.68 MIL/uL — AB (ref 3.80–5.20)
RDW: 16.1 % — ABNORMAL HIGH (ref 11.5–14.5)
WBC: 15.7 10*3/uL — AB (ref 3.6–11.0)

## 2017-09-11 LAB — PHOSPHORUS: PHOSPHORUS: 3.1 mg/dL (ref 2.5–4.6)

## 2017-09-11 LAB — TROPONIN I: TROPONIN I: 1.48 ng/mL — AB (ref <0.03)

## 2017-09-11 MED ORDER — POTASSIUM CHLORIDE 20 MEQ PO PACK
40.0000 meq | PACK | Freq: Two times a day (BID) | ORAL | Status: AC
  Administered 2017-09-11 (×2): 40 meq via ORAL
  Filled 2017-09-11 (×2): qty 2

## 2017-09-11 MED ORDER — SODIUM CHLORIDE 0.9 % IV SOLN
0.0000 ug/min | INTRAVENOUS | Status: DC
  Administered 2017-09-11: 20 ug/min via INTRAVENOUS
  Administered 2017-09-12 (×2): 120 ug/min via INTRAVENOUS
  Filled 2017-09-11 (×2): qty 40
  Filled 2017-09-11 (×2): qty 4

## 2017-09-11 MED ORDER — PANTOPRAZOLE SODIUM 40 MG PO PACK
40.0000 mg | PACK | Freq: Every day | ORAL | Status: DC
  Administered 2017-09-12: 40 mg
  Filled 2017-09-11: qty 20

## 2017-09-11 MED ORDER — DULOXETINE HCL 30 MG PO CPEP
60.0000 mg | ORAL_CAPSULE | Freq: Every day | ORAL | Status: DC
  Administered 2017-09-12: 60 mg via ORAL
  Filled 2017-09-11: qty 2

## 2017-09-11 MED ORDER — METOPROLOL TARTRATE 5 MG/5ML IV SOLN
2.5000 mg | INTRAVENOUS | Status: AC
Start: 2017-09-11 — End: 2017-09-11
  Administered 2017-09-11: 2.5 mg via INTRAVENOUS
  Filled 2017-09-11: qty 5

## 2017-09-11 MED ORDER — ARIPIPRAZOLE 5 MG PO TABS: 2.5000 mg | ORAL_TABLET | Freq: Two times a day (BID) | ORAL | Status: DC

## 2017-09-11 MED ORDER — ARIPIPRAZOLE 5 MG PO TABS
2.5000 mg | ORAL_TABLET | Freq: Two times a day (BID) | ORAL | Status: DC
  Administered 2017-09-11 – 2017-09-12 (×2): 2.5 mg
  Filled 2017-09-11 (×3): qty 1

## 2017-09-11 NOTE — Progress Notes (Signed)
Wake up assessment and SBT done. Patient was unable to make eye contact and not following commands. Extubation deferred per MD. Patient continues to be tachycardic. MD aware. Patient bathed and foot ulcer dressing changed. AKA dressing assessed and removed by vascular.

## 2017-09-11 NOTE — Progress Notes (Signed)
PT Cancellation Note  Patient Details Name: Nadara MustardJessie B Koppen MRN: [id number] DOB: [birth year]   Cancelled Treatment:    Reason Eval/Treat Not Completed: Medical issues which prohibited therapy.  Per chart, code blue called 12/16 d/t cardiac arrest (10 minutes CPR performed), pt intubated, and transferred to CCU.  D/t pt transferring to higher level of care, per PT protocol require new PT consult in order to continue therapy (will discontinue current PT order d/t this).  Please re-consult PT when pt is medically appropriate to participate in PT.  Hendricks LimesEmily Finlay Godbee, PT 09/11/17, 8:19 AM [phone]

## 2017-09-11 NOTE — Consult Note (Signed)
PULMONARY / CRITICAL CARE MEDICINE   Name: Maureen Taylor MRN: [id number] DOB: 01/23/1935    ADMISSION DATE:  09/23/2017 CONSULTATION DATE: 12/16  REFERRING MD:  Juliene PinaMody  PT PROFILE: [age] F adm from [facility] 09/[facility]/2017 to initiate IV abx for acute worsening of chronic RLE osteomyelitis after failing treatment with oral abx. Underwent R AKA 09/14/17. Suffered respiratory > cardiac arrest AM 09/10/17 with approx 10 mins chest compressions and requiring intubation, 2 amps epinephrine. Initial rhythm was sinus tachycardia but was obtained after epinephrine and pulse was restored. Made DNR in event of recurrent cardiac arrest after transfer to ICU  MAJOR EVENTS/TEST RESULTS: 12/07 Admission as documented above 12/08 Podiatry consultation: rec MRI and vascular surgery consultation 12/08 Vascular Surgery consultation: "The patient would benefit from undergoing a right lower extremity angiogram with possible intervention to assess the patient's anatomy and contributing peripheral artery disease." 12/08 MRI R foot/ankle: Osteomyelitis in the posterior calcaneus at the site of a large skin ulceration. The Achilles tendon is intact and unremarkable. Negative for abscess or septic joint 12/09 RLE venous US: No evidence of deep venous thrombosis 12/10 Aortogram and selective RLE angiogram. Aortogram and selective right lower extremity angiogram. Percutaneous transluminal angioplasty of right peroneal artery and tibioperoneal trunk, right popliteal artery, right anterior tibial artery  12/13 R AKA 12/16 initially noted to be in respiratory distress. Subsequently suffered cardiac arrest as documented above.  12/17 remains intubated, plan for SAT/SBT  INDWELLING DEVICES:: ETT 12/16 >>  R [place] CVL 12/16 >>   MICRO DATA: MRSA PCR 12/07 >> POS Blood 12/07 >> NEG Resp 12/16 >>    ANTIMICROBIALS:  Vancomycin 12/07 >>  Pip-tazo 12/07 >> 12/08 Levofloxacin 12/08 >> 12/11 Cefepime 12/11 >>  REVIEW OF SYSTEMS:    Unobtainable due to critical illness  SUBJECTIVE:  Patient remains intubated sedated on ventilatory support Patient with a diagnosis of CPR cardiac arrest We will plan for spontaneous awake and breathing trial this morning  VITAL SIGNS: BP (!) 96/51   Pulse (!) 121   Temp 99.2 F (37.3 C) (Axillary)   Resp (!) 8   Ht 5\' 5"  (1.651 m)   Wt 186 lb 15.2 oz (84.8 kg)   SpO2 96%   BMI 31.11 kg/m   HEMODYNAMICS:    VENTILATOR SETTINGS: Vent Mode: PRVC FiO2 (%):  [30 %-60 %] 30 % Set Rate:  [15 bmp] 15 bmp Vt Set:  [500 mL] 500 mL PEEP:  [5 cmH20] 5 cmH20  INTAKE / OUTPUT: I/O last 3 completed shifts: In: 2547.7 [I.V.:1922.2; Blood:625.5] Out: 925 [Urine:925]  PHYSICAL EXAMINATION: General: Unresponsive, intubated Neuro: Minimal withdrawal of all 4 extremities, CNs intact HEENT: NCAT, sclerae white Cardiovascular: Tachycardic, regular with extrasystoles, no M Lungs: R >L rhonchi Abdomen: Obese, soft, diminished BS Ext: S/P R AKA with surgical dressing, no LLE edema, nonpalpable pulses, cool Skin: unstaged sacral decub ulcer  LABS:  BMET Recent Labs  Lab 09/10/17 0638 09/10/17 2048 09/11/17 0437  NA 146* 145 146*  K 4.1 3.4* 3.4*  CL 115* 116* 117*  CO2 24 23 23   BUN 24* 31* 34*  CREATININE 1.37* 1.60* 1.59*  GLUCOSE 158* 187* 169*    Electrolytes Recent Labs  Lab 09/10/17 0638 09/10/17 2034 09/10/17 2048 09/11/17 0437  CALCIUM 9.2  --  8.3* 8.2*  MG  --  2.2  --  2.0  PHOS  --  3.8  --  3.1    CBC Recent Labs  Lab 09/10/17 [id number] 09/10/17 2048 09/11/17 0437  WBC 17.0* 15.9* 15.7*  HGB 7.3* 7.2* 7.9*  HCT 22.8* 22.7* 24.3*  PLT 300 380 322    Coag's No results for input(s): APTT, INR in the last 168 hours.  Sepsis Markers No results for input(s): LATICACIDVEN, PROCALCITON, O2SATVEN in the last 168 hours.  ABG Recent Labs  Lab 09/10/17 1030  PHART 7.35  PCO2ART 35  PO2ART 121*    Liver Enzymes No results for input(s): AST,  ALT, ALKPHOS, BILITOT, ALBUMIN in the last 168 hours.  Cardiac Enzymes Recent Labs  Lab 09/10/17 1207 09/10/17 1641 09/10/17 2034  TROPONINI 1.52* 2.42* 3.19*    Glucose Recent Labs  Lab 09/10/17 0718 09/10/17 1014 09/10/17 1605 09/10/17 1932 09/11/17 0040 09/11/17 0418  GLUCAP 138* 183* 238* 177* 159* 137*    CXR: Ill-defined, patchy R > L opacities EKG 12/16: sinus tach with freq PVCs, NSST changes   ASSESSMENT / PLAN:  82 year old white female with significant history of right leg osteomyelitis status post right AKA complicated by acute cardiac arrest likely from acute NSTEMI with CPR now with severe respiratory failure on ventilatory support  PULMONARY A: Acute respiratory failure Concern for aspiration pneumonia P:   Vent settings established Vent bundle implemented  CARDIOVASCULAR A:  S/P cardiac arrest 12/16 -initial rhythm unknown, 10 mins CPR Hypotension, sepsis and/or hypovolemia Elevated Trop I - NSTEMI vs demand ischemia P:  NS bolus ordered NE infusion ordered as needed to maintain MAP > 65 mmHg Echo pending  RENAL A:   AKI Hypernatremia P:   Monitor BMET intermittently Monitor I/Os Correct electrolytes as indicated  IVF's adjusted Per GOC discussion 12/16, not a candidate for HD  GASTROINTESTINAL A:   Recent protein-calorie malnutrition P:   SUP: IV PPI TF protocol initiated 12/16  HEMATOLOGIC A:   Hospital acquired anemia without evidence of acute blood loss P:  DVT px: SQ heparin Monitor CBC intermittently Transfuse per usual guidelines   INFECTIOUS A:   Severe sepsis Chronic osteomyelitis, RLE Suspected aspiration pneumonia P:   Monitor temp, WBC count Micro and abx as above   ENDOCRINE A:   DM 2, controlled P:   SSI changed to every 4 hours  NEUROLOGIC A:   Acute encephalopathy ICU/ventilator associated discomfort P:   RASS goal: -1, -2 PAD protocol - intermittent fentanyl, midazolam   FAMILY  -  Updates: 2 daughters and significant other updated in detail.  We discussed goals of care.  The patient had recently expressed a desire "just die in peace", however, also had indicated that she would wish to undergo intubation for a short time if needed to survive this illness.  I conveyed a very guarded prognosis given the critical nature of her illness.  We all agreed to DNR in event of recurrent cardiac arrest.  We agreed that she is not a candidate for dialysis of any form or duration in the event of progressive renal failure.  We agreed that the duration of our support would be relatively brief (not more than 5-7 days) with a daily reassessment of status and response to our therapies.  She is not a candidate for tracheostomy tube or long-term mechanical ventilation.  We agreed that we will not reintubate if she fails extubation when that time comes.    Critical Care Time devoted to patient care services described in this note is 45 minutes.   Overall, patient is critically ill, prognosis is guarded.  Patient with Multiorgan failure and at high risk for cardiac arrest and death.  Lucie Leather, M.D.  [facility]  Medical Director [facility] Medical Director [facility] Cardio-Pulmonary Department

## 2017-09-11 NOTE — Progress Notes (Signed)
MEDICATION RELATED CONSULT NOTE   Pharmacy Consult for electrolyte management  Indication: hypokalemia.   Pharmacy consulted for electrolyte management for 82 yo female admitted with osteomyelitis. Patient is s/p AKA and currently requiring mechanical ventilation. Patient received potassium 10mEq IV Q1hr x 3 dose overnight.   Plan:  Potassium 40mEq VT BID x 2 doses. Will recheck electrolytes with am labs. Goal potassium = 4 and magnesium = 2.    Allergies  Allergen Reactions  . Codeine Itching  . Morphine And Related Itching  . Indocin [Indomethacin] Rash    Patient Measurements: Height: 5\' 5"  (165.1 cm) Weight: 186 lb 15.2 oz (84.8 kg) IBW/kg (Calculated) : 57  Vital Signs: Temp: 100 F (37.8 C) (12/17 1200) Temp Source: Oral (12/17 1200) BP: 95/56 (12/17 1300) Pulse Rate: 124 (12/17 1300) Intake/Output from previous day: 12/16 0701 - 12/17 0700 In: 2547.7 [I.V.:1922.2; Blood:625.5] Out: 925 [Urine:925] Intake/Output from this shift: Total I/O In: 1881.2 [I.V.:1060.5; NG/GT:820.7] Out: 200 [Urine:200]  Labs: Recent Labs    09/10/17 0638 09/10/17 2034 09/10/17 2048 09/11/17 0437  WBC 17.0*  --  15.9* 15.7*  HGB 7.3*  --  7.2* 7.9*  HCT 22.8*  --  22.7* 24.3*  PLT 300  --  380 322  CREATININE 1.37*  --  1.60* 1.59*  MG  --  2.2  --  2.0  PHOS  --  3.8  --  3.1   Estimated Creatinine Clearance: 30.3 mL/min (A) (by C-G formula based on SCr of 1.59 mg/dL (H)).   Pharmacy will continue to monitor and adjust per consult.   Sarrinah Gardin L 09/11/2017,2:50 PM

## 2017-09-11 NOTE — Progress Notes (Signed)
[facility]  Daily Progress Note   Subjective  - 4 Days Post-Op  Patient experienced a CODE BLUE event yesterday and remains in the intensive care unit on the ventilator, tachycardic, and hypotensive despite pressors.  No family currently at bedside to obtain history and she is unable to provide history.  Have discussed the case with the critical care service and the cause of the event is not entirely clear, although stroke is among the possibilities.  Objective Vitals:   09/11/17 1300 09/11/17 1400 09/11/17 1500 09/11/17 1553  BP: (!) 95/56  (!) 84/44   Pulse: (!) 124 (!) 132 (!) 129   Resp: 13  16   Temp: 99 F (37.2 C)     TempSrc: Oral     SpO2: 97% 97% 100% 100%  Weight:      Height:        Intake/Output Summary (Last 24 hours) at 09/11/2017 1618 Last data filed at 09/11/2017 1529 Gross per 24 hour  Intake 3904.53 ml  Output 300 ml  Net 3604.53 ml    PULM  Coarse BS CV  Regular but tachycardic VASC  dressing taken down from the right above-knee amputation site and the incision is clean, dry, and intact.  No erythema or drainage   Laboratory CBC    Component Value Date/Time   WBC 15.7 (H) 09/11/2017 0437   HGB 7.9 (L) 09/11/2017 0437   HGB 12.8 06/01/2014 0414   HCT 24.3 (L) 09/11/2017 0437   HCT 38.8 06/01/2014 0414   PLT 322 09/11/2017 0437   PLT 235 06/01/2014 0414    BMET    Component Value Date/Time   NA 146 (H) 09/11/2017 0437   NA 137 01/10/2017   NA 142 05/31/2014 0455   K 3.4 (L) 09/11/2017 0437   K 3.7 05/31/2014 0455   CL 117 (H) 09/11/2017 0437   CL 107 05/31/2014 0455   CO2 23 09/11/2017 0437   CO2 27 05/31/2014 0455   GLUCOSE 169 (H) 09/11/2017 0437   GLUCOSE 234 (H) 05/31/2014 0455   BUN 34 (H) 09/11/2017 0437   BUN 19 01/10/2017   BUN 12 05/31/2014 0455   CREATININE 1.59 (H) 09/11/2017 0437   CREATININE 1.09 05/31/2014 0455   CALCIUM 8.2 (L) 09/11/2017 0437   CALCIUM 8.4 (L) 05/31/2014 0455   GFRNONAA 30  (L) 09/11/2017 0437   GFRNONAA 49 (L) 05/31/2014 0455   GFRAA 34 (L) 09/11/2017 0437   GFRAA 57 (L) 05/31/2014 0455    Assessment/Planning: POD #4 s/p right AKA   CODE BLUE event yesterday with deterioration of renal function, hypotension requiring pressors, and respiratory failure requiring ventilation.  Prognosis would appear to be very poor at this point.  No family currently at bedside, but the critical care service has discussed goals of care with the patient's family earlier today and she is now a DNR which is appropriate.  No other recs from a vascular point of view at this time.    Festus BarrenJason Velva Molinari  09/11/2017, 4:18 PM

## 2017-09-11 NOTE — Progress Notes (Signed)
[facility] [facility]   PATIENT NAME: Maureen PieriniJessie Taylor  [facility]  MR#:  [id number]  DATE OF BIRTH:  1935/06/03  SUBJECTIVE:   Patient currently intubated  REVIEW OF SYSTEMS:    Unable to obtain   Intubated  DRUG ALLERGIES:   Allergies  Allergen Reactions  . Codeine Itching  . Morphine And Related Itching  . Indocin [Indomethacin] Rash    VITALS:  Blood pressure 107/68, pulse (!) 130, temperature 99.4 F (37.4 C), temperature source Oral, resp. rate 15, height 5\' 5"  (1.651 m), weight 186 lb 15.2 oz (84.8 kg), SpO2 99 %.  PHYSICAL EXAMINATION:  Constitutional: Critically ill-appearing Eyes: Conjunctivae not pale or no icterus. PERRLA,   Neck:  Neck supple. No JVD. No tracheal deviation. CVS:  S1/S2 +, no murmurs, no gallops, no carotid bruit.  Pulmonary: Remains intubated decreased breath sounds Abdominal: Soft. BS +,  no distension, tenderness, rebound or guarding.   Neuro: Sedated and intubated Skin: left foot in cushion and right AKA dressed Psychiatric: Sedated    LABORATORY PANEL:   CBC Recent Labs  Lab 09/11/17 0437  WBC 15.7*  HGB 7.9*  HCT 24.3*  PLT 322   ------------------------------------------------------------------------------------------------------------------  Chemistries  Recent Labs  Lab 09/11/17 0437  NA 146*  K 3.4*  CL 117*  CO2 23  GLUCOSE 169*  BUN 34*  CREATININE 1.59*  CALCIUM 8.2*  MG 2.0   ------------------------------------------------------------------------------------------------------------------  Cardiac Enzymes Recent Labs  Lab 09/10/17 1207 09/10/17 1641 09/10/17 2034  TROPONINI 1.52* 2.42* 3.19*   ------------------------------------------------------------------------------------------------------------------  RADIOLOGY:  Dg Chest Port 1 View  Result Date: 09/11/2017 CLINICAL DATA:  82 year old female with respiratory failure. Subsequent encounter. EXAM: PORTABLE CHEST 1  VIEW COMPARISON:  09/10/2017 FINDINGS: Endotracheal tube tip 3 cm above the carina. Right central line tip mid to distal superior vena cava level. Mild pulmonary vascular congestion. Cannot exclude a tiny right-sided pleural effusion. No segmental consolidation or pneumothorax. Cardiomegaly. Calcified tortuous aorta. IMPRESSION: Mild pulmonary vascular congestion. Cardiomegaly. Aortic Atherosclerosis (ICD10-I70.0). Electronically Signed   By: Lacy DuverneySteven  Olson M.D.   On: 09/11/2017 07:05   Dg Chest Port 1 View  Result Date: 09/10/2017 CLINICAL DATA:  Post intubation. EXAM: PORTABLE CHEST 1 VIEW COMPARISON:  07/12/2017 FINDINGS: Post intubation. Endotracheal tube 4.5 cm above the carina. Enteric catheter descends to the abdomen, tip collimated off the image. Right subclavian central venous catheter terminates at the expected location of the cavoatrial junction. Cardiomediastinal silhouette is normal. Mediastinal contours appear intact. There is no evidence of focal airspace consolidation, pleural effusion or pneumothorax. Low lung volumes. Osseous structures are without acute abnormality. Soft tissues are grossly normal. IMPRESSION: Status post intubation and central line placement. Low lung volumes.  No evidence of pneumothorax. Electronically Signed   By: Ted Mcalpineobrinka  Dimitrova M.D.   On: 09/10/2017 12:13     ASSESSMENT AND PLAN:   82 year old female with a history of Charcot's joint, diabetes and obesity who presented due to concerns of osteomyelitis.  1.  Acute respiratory arrest Continue ventilator Appreciate intensivist input   2. Acute on chronic Osteomyelitis in the posterior calcaneus at the site of a large skin ulceration: Patient is status post right AKA Vascular following Status post treatment with antibiotics  3. Diabetes: Continue sliding scale  4. Depression:  Cymbalta and LAMICTAL  5. Parkinson's disease:  Sinemet 6. Acute on chronic anemia:  Monitor CBC 7.  CODE STATUS DNR  now     Overall poor prognosis Consider palliative care consult  CODE STATUS: full  CRITICAL CARE TOTAL TIME TAKING CARE OF THIS PATIENT: 35 minutes.     POSSIBLE D/C ??    Auburn BilberryPATEL, Dalayah Deahl M.D on 09/11/2017 at 7:05 PM  Between 7am to 6pm - Pager - [phone] After 6pm go to [email or web address] - password [email or web address]  [facility] [place] [facility]  Office  [phone]  CC: Primary care physician; Dorothey BasemanBronstein, David, MD  Note: This dictation was prepared with Dragon dictation along with smaller phrase technology. Any transcriptional errors that result from this process are unintentional.

## 2017-09-11 NOTE — Progress Notes (Signed)
Clinical Child psychotherapistocial Worker (CSW) updated [facility] liaison that patient is in the ICU.   Baker Hughes IncorporatedBailey Edit Ricciardelli, LCSW [phone]

## 2017-09-11 NOTE — Plan of Care (Signed)
Patient continues on vent.  Medications tolerated. Veteran agitated with movement, turning, and repositioning.  Clenches teeth and tightens arms. Continues on Levophed. Coverage given for blood glucose control.  On unit RBCs given. Will continue to monitor.

## 2017-09-12 ENCOUNTER — Inpatient Hospital Stay: Payer: Medicare Other

## 2017-09-12 DIAGNOSIS — Z01818 Encounter for other preprocedural examination: Secondary | ICD-10-CM

## 2017-09-12 LAB — BPAM RBC
BLOOD PRODUCT EXPIRATION DATE: 201901072359
ISSUE DATE / TIME: 201812162238
UNIT TYPE AND RH: 5100

## 2017-09-12 LAB — BASIC METABOLIC PANEL
ANION GAP: 6 (ref 5–15)
BUN: 38 mg/dL — ABNORMAL HIGH (ref 6–20)
CO2: 23 mmol/L (ref 22–32)
CREATININE: 1.57 mg/dL — AB (ref 0.44–1.00)
Calcium: 8.4 mg/dL — ABNORMAL LOW (ref 8.9–10.3)
Chloride: 119 mmol/L — ABNORMAL HIGH (ref 101–111)
GFR calc Af Amer: 35 mL/min — ABNORMAL LOW (ref >60)
GFR calc non Af Amer: 30 mL/min — ABNORMAL LOW (ref >60)
GLUCOSE: 190 mg/dL — AB (ref 65–99)
Potassium: 4.4 mmol/L (ref 3.5–5.1)
Sodium: 148 mmol/L — ABNORMAL HIGH (ref 135–145)

## 2017-09-12 LAB — BLOOD GAS, ARTERIAL
ACID-BASE DEFICIT: 1.7 mmol/L (ref 0.0–2.0)
BICARBONATE: 23.7 mmol/L (ref 20.0–28.0)
FIO2: 0.3
LHR: 15 {breaths}/min
MECHVT: 500 mL
Mechanical Rate: 15
O2 SAT: 94.5 %
PEEP/CPAP: 5 cmH2O
PH ART: 7.36 (ref 7.350–7.450)
PO2 ART: 76 mmHg — AB (ref 83.0–108.0)
Patient temperature: 37
pCO2 arterial: 42 mmHg (ref 32.0–48.0)

## 2017-09-12 LAB — TYPE AND SCREEN
ABO/RH(D): O POS
Antibody Screen: NEGATIVE
UNIT DIVISION: 0

## 2017-09-12 LAB — GLUCOSE, CAPILLARY
GLUCOSE-CAPILLARY: 140 mg/dL — AB (ref 65–99)
Glucose-Capillary: 157 mg/dL — ABNORMAL HIGH (ref 65–99)
Glucose-Capillary: 181 mg/dL — ABNORMAL HIGH (ref 65–99)
Glucose-Capillary: 190 mg/dL — ABNORMAL HIGH (ref 65–99)

## 2017-09-12 LAB — CBC
HEMATOCRIT: 23.8 % — AB (ref 35.0–47.0)
HEMOGLOBIN: 7.6 g/dL — AB (ref 12.0–16.0)
MCH: 29 pg (ref 26.0–34.0)
MCHC: 31.8 g/dL — ABNORMAL LOW (ref 32.0–36.0)
MCV: 91.1 fL (ref 80.0–100.0)
Platelets: 341 10*3/uL (ref 150–440)
RBC: 2.62 MIL/uL — AB (ref 3.80–5.20)
RDW: 16.5 % — AB (ref 11.5–14.5)
WBC: 13.1 10*3/uL — AB (ref 3.6–11.0)

## 2017-09-12 LAB — PHOSPHORUS: PHOSPHORUS: 2.1 mg/dL — AB (ref 2.5–4.6)

## 2017-09-12 LAB — MAGNESIUM: MAGNESIUM: 2 mg/dL (ref 1.7–2.4)

## 2017-09-12 MED ORDER — SCOPOLAMINE 1 MG/3DAYS TD PT72
1.0000 | MEDICATED_PATCH | TRANSDERMAL | Status: DC
  Administered 2017-09-12: 1.5 mg via TRANSDERMAL
  Filled 2017-09-12: qty 1

## 2017-09-12 MED ORDER — SODIUM CHLORIDE 0.9 % IV SOLN
5.0000 mg/h | INTRAVENOUS | Status: DC
  Administered 2017-09-12: 4 mg/h via INTRAVENOUS
  Administered 2017-09-13 – 2017-09-14 (×4): 20 mg/h via INTRAVENOUS
  Filled 2017-09-12 (×5): qty 10

## 2017-09-12 MED ORDER — INSULIN ASPART 100 UNIT/ML ~~LOC~~ SOLN
2.0000 [IU] | SUBCUTANEOUS | Status: DC
  Administered 2017-09-12: 4 [IU] via SUBCUTANEOUS
  Filled 2017-09-12: qty 1

## 2017-09-12 MED ORDER — GLYCOPYRROLATE 0.2 MG/ML IJ SOLN
0.3000 mg | INTRAMUSCULAR | Status: DC | PRN
  Administered 2017-09-12 – 2017-09-14 (×6): 0.3 mg via INTRAVENOUS
  Filled 2017-09-12 (×3): qty 1.5
  Filled 2017-09-12: qty 2
  Filled 2017-09-12 (×5): qty 1.5

## 2017-09-12 MED ORDER — ACETAMINOPHEN 650 MG RE SUPP
650.0000 mg | Freq: Four times a day (QID) | RECTAL | Status: DC | PRN
  Administered 2017-09-14 (×2): 650 mg via RECTAL
  Filled 2017-09-12 (×2): qty 1

## 2017-09-12 NOTE — Progress Notes (Signed)
Pt medicate with scopolamine and Robinul as ordered.  Comfort care measures in effect.

## 2017-09-12 NOTE — Consult Note (Signed)
PULMONARY / CRITICAL CARE MEDICINE   Name: Nadara MustardJessie B Raker MRN: [id number] DOB: 1935/02/24    ADMISSION DATE:  09/16/2017 CONSULTATION DATE: 12/16  REFERRING MD:  Juliene PinaMody  PT PROFILE: [age] F adm from SNF 09/23/2017 to initiate IV abx for acute worsening of chronic RLE osteomyelitis after failing treatment with oral abx. Underwent R AKA 09/09/2017. Suffered respiratory > cardiac arrest AM 09/10/17 with approx 10 mins chest compressions and requiring intubation, 2 amps epinephrine. Initial rhythm was sinus tachycardia but was obtained after epinephrine and pulse was restored. Made DNR in event of recurrent cardiac arrest after transfer to ICU  MAJOR EVENTS/TEST RESULTS: 12/07 Admission as documented above 12/08 Podiatry consultation: rec MRI and vascular surgery consultation 12/08 Vascular Surgery consultation: "The patient would benefit from undergoing a right lower extremity angiogram with possible intervention to assess the patient's anatomy and contributing peripheral artery disease." 12/08 MRI R foot/ankle: Osteomyelitis in the posterior calcaneus at the site of a large skin ulceration. The Achilles tendon is intact and unremarkable. Negative for abscess or septic joint 12/09 RLE venous US: No evidence of deep venous thrombosis 12/10 Aortogram and selective RLE angiogram. Aortogram and selective right lower extremity angiogram. Percutaneous transluminal angioplasty of right peroneal artery and tibioperoneal trunk, right popliteal artery, right anterior tibial artery  12/13 R AKA 12/16 initially noted to be in respiratory distress. Subsequently suffered cardiac arrest as documented above.  12/17 remains intubated, plan for SAT/SBT 12/18 remains encephalopathic  INDWELLING DEVICES:: ETT 12/16 >>  R [place] CVL 12/16 >>   MICRO DATA: MRSA PCR 12/07 >> POS Blood 12/07 >> NEG Resp 12/16 >>    ANTIMICROBIALS:  Vancomycin 12/07 >>  Pip-tazo 12/07 >> 12/08 Levofloxacin 12/08 >> 12/11 Cefepime  12/11 >>  REVIEW OF SYSTEMS:   Unobtainable due to critical illness  SUBJECTIVE:  Patient remains intubated sedated on ventilatory support Patient with a diagnosis of CPR cardiac arrest We will plan for spontaneous awake and breathing trial this morning  VITAL SIGNS: BP (!) 113/54   Pulse 96   Temp 98.8 F (37.1 C) (Axillary)   Resp 15   Ht 5\' 5"  (1.651 m)   Wt 186 lb 15.2 oz (84.8 kg)   SpO2 100%   BMI 31.11 kg/m   HEMODYNAMICS:    VENTILATOR SETTINGS: Vent Mode: PRVC FiO2 (%):  [30 %] 30 % Set Rate:  [15 bmp] 15 bmp Vt Set:  [500 mL] 500 mL PEEP:  [5 cmH20] 5 cmH20 Pressure Support:  [5 cmH20] 5 cmH20  INTAKE / OUTPUT: I/O last 3 completed shifts: In: 4923.9 [I.V.:2804.4; Blood:625.5; NG/GT:1494] Out: 725 [Urine:725]  PHYSICAL EXAMINATION: General: Unresponsive, intubated Neuro: Minimal withdrawal of all 4 extremities, CNs intact HEENT: NCAT, sclerae white Cardiovascular: Tachycardic, regular with extrasystoles, no M Lungs: R >L rhonchi Abdomen: Obese, soft, diminished BS Ext: S/P R AKA with surgical dressing, no LLE edema, nonpalpable pulses, cool Skin: unstaged sacral decub ulcer  LABS:  BMET Recent Labs  Lab 09/10/17 2048 09/11/17 0437 09/12/17 0426  NA 145 146* 148*  K 3.4* 3.4* 4.4  CL 116* 117* 119*  CO2 23 23 23   BUN 31* 34* 38*  CREATININE 1.60* 1.59* 1.57*  GLUCOSE 187* 169* 190*    Electrolytes Recent Labs  Lab 09/10/17 2034 09/10/17 2048 09/11/17 0437 09/12/17 0426  CALCIUM  --  8.3* 8.2* 8.4*  MG 2.2  --  2.0 2.0  PHOS 3.8  --  3.1 2.1*    CBC Recent Labs  Lab 09/10/17 [phone]  09/10/17 2048 09/11/17 0437  WBC 17.0* 15.9* 15.7*  HGB 7.3* 7.2* 7.9*  HCT 22.8* 22.7* 24.3*  PLT 300 380 322    Coag's No results for input(s): APTT, INR in the last 168 hours.  Sepsis Markers No results for input(s): LATICACIDVEN, PROCALCITON, O2SATVEN in the last 168 hours.  ABG Recent Labs  Lab 09/10/17 1030  PHART 7.35  PCO2ART  35  PO2ART 121*    Liver Enzymes No results for input(s): AST, ALT, ALKPHOS, BILITOT, ALBUMIN in the last 168 hours.  Cardiac Enzymes Recent Labs  Lab 09/10/17 1641 09/10/17 2034 09/11/17 2106  TROPONINI 2.42* 3.19* 1.48*    Glucose Recent Labs  Lab 09/11/17 1154 09/11/17 1653 09/11/17 1945 09/11/17 2115 09/12/17 0037 09/12/17 0411  GLUCAP 198* 228* 213* 241* 190* 157*    CXR: Ill-defined, patchy R > L opacities EKG 12/16: sinus tach with freq PVCs, NSST changes   ASSESSMENT / PLAN:  82 year old white female with significant history of right leg osteomyelitis status post right AKA complicated by acute cardiac arrest likely from acute NSTEMI with CPR now with severe respiratory failure on ventilatory support  PULMONARY A: Acute respiratory failure Concern for aspiration pneumonia P:   Vent settings established Vent bundle implemented  CARDIOVASCULAR A:  S/P cardiac arrest 12/16 -initial rhythm unknown, 10 mins CPR Hypotension, sepsis and/or hypovolemia Elevated Trop I - NSTEMI vs demand ischemia P:  NS bolus ordered NE infusion ordered as needed to maintain MAP > 65 mmHg Echo pending  RENAL A:   AKI Hypernatremia P:   Monitor BMET intermittently Monitor I/Os Correct electrolytes as indicated  IVF's adjusted Per GOC discussion 12/16, not a candidate for HD  GASTROINTESTINAL A:   Recent protein-calorie malnutrition P:   SUP: IV PPI TF protocol initiated 12/16  HEMATOLOGIC A:   Hospital acquired anemia without evidence of acute blood loss P:  DVT px: SQ heparin Monitor CBC intermittently Transfuse per usual guidelines   INFECTIOUS A:   Severe sepsis Chronic osteomyelitis, RLE Suspected aspiration pneumonia P:   Monitor temp, WBC count Micro and abx as above   ENDOCRINE A:   DM 2, controlled P:   SSI changed to every 4 hours  NEUROLOGIC A:   Acute encephalopathy ICU/ventilator associated discomfort P:   RASS goal: -1,  -2 PAD protocol - intermittent fentanyl, midazolam Will obtain CT head  FAMILY  - Updates: 2 daughters and significant other updated in detail.  We discussed goals of care.  The patient had recently expressed a desire "just die in peace", however, also had indicated that she would wish to undergo intubation for a short time if needed to survive this illness.  I conveyed a very guarded prognosis given the critical nature of her illness.  We all agreed to DNR in event of recurrent cardiac arrest.  We agreed that she is not a candidate for dialysis of any form or duration in the event of progressive renal failure.  We agreed that the duration of our support would be relatively brief (not more than 5-7 days) with a daily reassessment of status and response to our therapies.  She is not a candidate for tracheostomy tube or long-term mechanical ventilation.  We agreed that we will not reintubate if she fails extubation when that time comes.    Critical Care Time devoted to patient care services described in this note is 38 minutes.   Overall, patient is critically ill, prognosis is guarded.  Patient with Multiorgan failure and at  high risk for cardiac arrest and death.    Lucie LeatherKurian David Amare Kontos, M.D.  [facility]  Medical Director [facility] Medical Director [facility]io[facility]

## 2017-09-12 NOTE — Progress Notes (Signed)
Family discussion held this afternoon with daughters I have explained to them that patient does not having any acute findings on the CT scan of her head I have also explained that patient still requires ventilatory support and that patient remains a DNR and remains encephalopathic  At this time family has conveyed to me and has expressed that patient would not want to survive on ventilator at this time and  they would like to proceed with removing the ventilator and to pursue comfort care measures.  I have explained to them that the plan of action should be to wean her sedation and to perform a one-way extubation and assess her respiratory status and if she decompensates or progressively declines with her respiratory failure we will make her comfortable and proceed with comfort care measures.   Family are very satisfied with Plan of action and management. All questions answered  Lucie LeatherKurian David Aundria Bitterman, M.D.  [facility]  Medical Director [facility] Medical Director [facility] Cardio[facility]Pulmonary [facility]

## 2017-09-12 NOTE — Care Management (Signed)
Updated insurance with [employer] effective 08/26/17; ID [id number]

## 2017-09-12 NOTE — Progress Notes (Signed)
Placed on high fowlers position, cuff deflated, suctioned orally and endotracheally and then extubated to 4 lpm O2 [place] 

## 2017-09-12 NOTE — Progress Notes (Signed)
Daily Progress Note   Patient Name: Maureen Taylor       Date: 09/12/2017 DOB: [birth year]  Age: 82 y.o. MRN#: [id number] Attending Physician: Dustin Flock, MD Primary Care Physician: Juluis Pitch, MD Admit Date: 09/16/2017  Reason for Consultation/Follow-up: Establishing goals of care  Subjective: Patient moved to ICU following cardiopulmonary arrest.  Ms. Sorci is resting in bed on ventilator. Family at bedside. Support offered. They state patient is going to CT for a scan. They have anxiety about their mother's current situation and what decisions to make. Per notes, plan of care in place, current duration of support for no more than 5-7 days. Will follow tomorrow.    Length of Stay: 11  Current Medications: Scheduled Meds:  . ARIPiprazole  2.5 mg Per Tube BID  . carbidopa-levodopa  1 tablet Per Tube BID  . chlorhexidine gluconate (MEDLINE KIT)  15 mL Mouth Rinse BID  . DULoxetine  60 mg Oral Daily  . feeding supplement (PRO-STAT SUGAR FREE 64)  30 mL Per Tube BID  . feeding supplement (VITAL HIGH PROTEIN)  1,000 mL Per Tube Q24H  . folic acid  1 mg Per Tube Daily  . heparin  5,000 Units Subcutaneous Q8H  . insulin aspart  2-6 Units Subcutaneous Q4H  . ipratropium-albuterol  3 mL Nebulization Q6H  . mouth rinse  15 mL Mouth Rinse 10 times per day  . multivitamin  15 mL Per Tube Daily  . pantoprazole sodium  40 mg Per Tube Daily  . polyvinyl alcohol  1 drop Both Eyes TID  . vitamin C  250 mg Per Tube BID    Continuous Infusions: . fentaNYL infusion INTRAVENOUS Stopped (09/12/17 0756)  . phenylephrine (NEO-SYNEPHRINE) Adult infusion 120 mcg/min (09/12/17 1116)    PRN Meds: acetaminophen, bisacodyl, fentaNYL, midazolam, midazolam, polyethylene glycol  Physical Exam    Constitutional: No distress.  Pulmonary/Chest:  Ventilator            Vital Signs: BP (!) 107/58   Pulse (!) 104   Temp 99.7 F (37.6 C) (Oral)   Resp 15   Ht _0  (1.651 m)   Wt 84.8 kg (186 lb 15.2 oz)   SpO2 99%   BMI 31.11 kg/m  SpO2: SpO2: 99 % O2 Device: O2 Device: Ventilator O2 Flow Rate: O2 Flow Rate (  L/min): 2 L/min  Intake/output summary:   Intake/Output Summary (Last 24 hours) at 09/12/2017 1152 Last data filed at 09/12/2017 1100 Gross per 24 hour  Intake 3640.61 ml  Output 700 ml  Net 2940.61 ml   LBM: Last BM Date: 09/09/17 Baseline Weight: Weight: 90.5 kg (199 lb 9.6 oz) Most recent weight: Weight: 84.8 kg (186 lb 15.2 oz)       Palliative Assessment/Data: 10%    Flowsheet Rows     Most Recent Value  Intake Tab  Referral Department  Hospitalist  Unit at Time of Referral  Med/Surg Unit  Palliative Care Primary Diagnosis  Sepsis/Infectious Disease  Date Notified  09/06/17  Palliative Care Type  Return patient Palliative Care  Reason for referral  Clarify Goals of Care  Date of Admission  08/27/2017  Date first seen by Palliative Care  09/06/17  # of days Palliative referral response time  0 Day(s)  # of days IP prior to Palliative referral  5  Clinical Assessment  Psychosocial & Spiritual Assessment  Palliative Care Outcomes      Patient Active Problem List   Diagnosis Date Noted  . Osteomyelitis ([place]) 08/30/2017  . Pressure injury of skin 06/20/2017  . SIRS (systemic inflammatory response syndrome) ([place]) 06/17/2017  . Sepsis ([place]) 10/05/2016  . Acute encephalopathy 10/05/2016  . Diabetes mellitus with complication ([place])   . Complicated UTI (urinary tract infection)   . Acute cystitis without hematuria   . Morbid obesity ([place])   . UTI (urinary tract infection) 08/24/2016  . Altered mental status 08/24/2016  . Constipation 08/24/2016  . Hyperlipidemia 06/05/2015  . Type 2 diabetes mellitus with diabetic polyneuropathy ([place]) 06/05/2015   . Vitamin D deficiency 06/05/2015  . Recurrent UTI 04/17/2015  . Vaginal atrophy 04/17/2015  . Incontinence 04/17/2015  . OAB (overactive bladder) 03/03/2015  . Bipolar depression ([place]) 01/05/2015  . GERD (gastroesophageal reflux disease) 01/05/2015  . Allergic rhinitis 01/05/2015  . Essential hypertension 01/05/2015  . Candida infection 01/05/2015  . Parkinson's disease ([place]) 01/05/2015  . Neuropathy 01/05/2015  . Diabetes mellitus with neuropathy ([place]) 01/05/2015    Palliative Care Assessment & Plan   Patient Profile: [name] a82 y.o.femalewith a known history of Charcot's joint, GERD, HTn, DM, HLD, Obesity, neuropathy, Osteomyelitis on foot- treated with doxy for 14 days in Sept 2018- sent to [facility], cont to have repeated Inf- also was given Rocephin and Clindamycin for last few days, still no improvement, so sent to ER.     Assessment: Patient resting in bed on vent.   Recommendations/Plan:  Reevaluate tomorrow. Continue current level of care.   Goals of Care and Additional Recommendations:  Limitations on Scope of Treatment: No Hemodialysis and No Tracheostomy  Code Status:    Code Status Orders  (From admission, onward)        Start     Ordered   09/10/17 1248  Do not attempt resuscitation (DNR)  Continuous    Question Answer Comment  In the event of cardiac or respiratory ARREST Do not call a "code blue"   In the event of cardiac or respiratory ARREST Do not perform Intubation, CPR, defibrillation or ACLS   In the event of cardiac or respiratory ARREST Use medication by any route, position, wound care, and other measures to relive pain and suffering. May use oxygen, suction and manual treatment of airway obstruction as needed for comfort.   Comments full medical support short of ACLS. Also no dialysis of any form or  any duration      09/10/17 1248    Code Status History    Date Active Date Inactive Code Status Order ID Comments User Context    09/14/2017 19:48 09/10/2017 12:48 Full Code [id number]  Vaughan Basta, MD Inpatient   06/17/2017 15:52 06/24/2017 19:37 Full Code [id number]  Idelle Crouch, MD Inpatient   10/05/2016 12:17 10/09/2016 18:37 Full Code [id number]  Radene Gunning, NP ED   08/24/2016 20:19 08/27/2016 19:12 Full Code [id number]  Mariel Aloe, MD Inpatient    Advance Directive Documentation     Most Recent Value  Type of Advance Directive  Out of facility DNR (pink MOST or yellow form)  Pre-existing out of facility DNR order (yellow form or pink MOST form)  Pink MOST form placed in chart (order not valid for inpatient use)  "MOST" Form in Place?  No data       Prognosis:   Unable to determine  Discharge Planning:  To Be Determined  Care plan was discussed with Dr. Mortimer Fries  Thank you for allowing the Palliative Medicine Team to assist in the care of this patient.   Total Time 35 min Prolonged Time Billed  No       Greater than 50%  of this time was spent counseling and coordinating care related to the above assessment and plan.  Asencion Gowda, NP  Please contact Palliative Medicine Team phone at [phone] for questions and concerns.

## 2017-09-12 NOTE — Progress Notes (Signed)
[facility] - [place] [facility]   PATIENT NAME: Maureen Taylor    MR#:  [id number]  DATE OF BIRTH:  1934-10-23  SUBJECTIVE:  Patient  remains intubated and on the vent  REVIEW OF SYSTEMS:    Unable to obtain   Intubated  DRUG ALLERGIES:   Allergies  Allergen Reactions  . Codeine Itching  . Morphine And Related Itching  . Indocin [Indomethacin] Rash    VITALS:  Blood pressure (!) 122/59, pulse (!) 36, temperature 99.7 F (37.6 C), temperature source Oral, resp. rate 15, height 5\' 5"  (1.651 m), weight 186 lb 15.2 oz (84.8 kg), SpO2 100 %.  PHYSICAL EXAMINATION:  Constitutional: Critically ill-appearing Eyes: Conjunctivae not pale or no icterus. PERRLA,   Neck:  Neck supple. No JVD. No tracheal deviation. CVS:  S1/S2 +, no murmurs, no gallops, no carotid bruit.  Pulmonary: Remains intubated decreased breath sounds Abdominal: Soft. BS +,  no distension, tenderness, rebound or guarding.   Neuro: Sedated and intubated Skin: left foot in cushion and right AKA dressed Psychiatric: Sedated    LABORATORY PANEL:   CBC Recent Labs  Lab 09/12/17 1044  WBC 13.1*  HGB 7.6*  HCT 23.8*  PLT 341   ------------------------------------------------------------------------------------------------------------------  Chemistries  Recent Labs  Lab 09/12/17 0426  NA 148*  K 4.4  CL 119*  CO2 23  GLUCOSE 190*  BUN 38*  CREATININE 1.57*  CALCIUM 8.4*  MG 2.0   ------------------------------------------------------------------------------------------------------------------  Cardiac Enzymes Recent Labs  Lab 09/10/17 1641 09/10/17 2034 09/11/17 2106  TROPONINI 2.42* 3.19* 1.48*   ------------------------------------------------------------------------------------------------------------------  RADIOLOGY:  Ct Head Wo Contrast  Result Date: 09/12/2017 CLINICAL DATA:  Patient status post cardiac arrest 09/10/2017. EXAM: CT HEAD WITHOUT CONTRAST  TECHNIQUE: Contiguous axial images were obtained from the base of the skull through the vertex without intravenous contrast. COMPARISON:  Head CT scan 07/12/2017. FINDINGS: Brain: Atrophy and chronic microvascular ischemic change are again seen. Remote lacunar infarction right basal ganglia is noted. No evidence of acute abnormality including infarct, hemorrhage, mass lesion, mass effect, midline shift or abnormal extra-axial fluid collection. No hydrocephalus or pneumocephalus. Vascular: Atherosclerosis noted. Skull: Intact. Sinuses/Orbits: Negative. Other: None. IMPRESSION: No acute abnormality. Atrophy and chronic microvascular ischemic change. Atherosclerosis. Electronically Signed   By: Drusilla Kannerhomas  Dalessio M.D.   On: 09/12/2017 12:27   Dg Chest Port 1 View  Result Date: 09/11/2017 CLINICAL DATA:  82 year old female with respiratory failure. Subsequent encounter. EXAM: PORTABLE CHEST 1 VIEW COMPARISON:  09/10/2017 FINDINGS: Endotracheal tube tip 3 cm above the carina. Right central line tip mid to distal superior vena cava level. Mild pulmonary vascular congestion. Cannot exclude a tiny right-sided pleural effusion. No segmental consolidation or pneumothorax. Cardiomegaly. Calcified tortuous aorta. IMPRESSION: Mild pulmonary vascular congestion. Cardiomegaly. Aortic Atherosclerosis (ICD10-I70.0). Electronically Signed   By: Lacy DuverneySteven  Olson M.D.   On: 09/11/2017 07:05     ASSESSMENT AND PLAN:   82 year old female with a history of Charcot's joint, diabetes and obesity who presented due to concerns of osteomyelitis.  1.  Acute respiratory arrest Continue ventilator Appreciate intensivist input  2. Acute on chronic Osteomyelitis in the posterior calcaneus at the site of a large skin ulceration: Patient is status post right AKA Vascular following Status post treatment with antibiotics  3. Diabetes: Continue sliding scale  4. Depression:  Cymbalta and LAMICTAL  5. Parkinson's disease:   Sinemet 6. Acute on chronic anemia:  Monitor CBC  7.  CODE STATUS DNR now     Overall  poor prognosis Consider palliative care consult  CODE STATUS: full  CRITICAL CARE TOTAL TIME TAKING CARE OF THIS PATIENT: 35 minutes.         Auburn BilberryPATEL, Leelyn Jasinski M.D on 09/12/2017 at 3:55 PM  Between 7am to 6pm - Pager - [phone] After 6pm go to [email or web address] - password [email or web address]  [facility]  Office  [phone]  CC: Primary care physician; Dorothey BasemanBronstein, David, MD  Note: This dictation was prepared with Dragon dictation along with smaller phrase technology. Any transcriptional errors that result from this process are unintentional.

## 2017-09-12 NOTE — Progress Notes (Signed)
1940 Pt with thick copious secretions.  Attempted to orally suction.  Discussed with Bin[place], NP robinal and scopalamine for comfort.

## 2017-09-13 LAB — BLOOD GAS, ARTERIAL
Acid-base deficit: 5.6 mmol/L — ABNORMAL HIGH (ref 0.0–2.0)
BICARBONATE: 19.3 mmol/L — AB (ref 20.0–28.0)
FIO2: 0.6
LHR: 15 {breaths}/min
O2 SAT: 98.5 %
PATIENT TEMPERATURE: 37
PCO2 ART: 35 mmHg (ref 32.0–48.0)
PEEP/CPAP: 5 cmH2O
PH ART: 7.35 (ref 7.350–7.450)
VT: 500 mL
pO2, Arterial: 121 mmHg — ABNORMAL HIGH (ref 83.0–108.0)

## 2017-09-13 LAB — CULTURE, RESPIRATORY

## 2017-09-13 LAB — CULTURE, RESPIRATORY W GRAM STAIN

## 2017-09-13 NOTE — Consult Note (Signed)
WOC Nurse wound follow up Patient is comfort care at this time following cardiac arrest. In to assess wounds today.  No family at bedside at this time.  Patient does not arouse to my voice.   Wound type:Right  AKA.  Xeroform gauze in place.  Asked bedside RN to please wrap the stump with kerlix and tape.  Left heel with stage 3 to wound bed.  Xeroform and gauze in place.  Covered with silicone foam and offloading heel boot in place.  Measurement: 2 cm x 2 cm x 0.2 cm  Wound bed: pink and moist Drainage (amount, consistency, odor) minimal serosanguinous  Periwound:intact No changes to wound care.  Comfort care Will not follow at this time.  Please re-consult if needed.  Maple HudsonKaren Druscilla Petsch RN BSN CWON Pager [phone]

## 2017-09-13 NOTE — Progress Notes (Signed)
Notified [name] via voicemail and Rudean HaskellJennifer K verbally of patient transfer to RM 128.

## 2017-09-13 NOTE — Clinical Social Work Note (Signed)
CSW has been notified that patient has now transitioned to comfort care. Will updated Maureen Taylor at [facility]. York SpanielMonica Courtlyn Taylor MSW,LCSW [phone]

## 2017-09-13 NOTE — Progress Notes (Signed)
Daily Progress Note   Patient Name: Maureen Taylor       Date: 09/13/2017 DOB: 1934-12-18  Age: 82 y.o. MRN#: [id number] Attending Physician: Dustin Flock, MD Primary Care Physician: Juluis Pitch, MD Admit Date: 08/30/2017  Reason for Consultation/Follow-up: Establishing goals of care  Subjective: Ms. Savoia is resting in bed. Family is at bedside. Patient made comfort care overnight and Morphine drip and Rubinol in place per their orders. Patient is resting with no signs of distress. Irregular respirations. Will continue to monitor. Offered support for family.   Length of Stay: 12  Current Medications: Scheduled Meds:  . chlorhexidine gluconate (MEDLINE KIT)  15 mL Mouth Rinse BID  . mouth rinse  15 mL Mouth Rinse 10 times per day  . multivitamin  15 mL Per Tube Daily  . polyvinyl alcohol  1 drop Both Eyes TID  . scopolamine  1 patch Transdermal Q72H    Continuous Infusions: . morphine 20 mg/hr (09/13/17 0538)    PRN Meds: acetaminophen, bisacodyl, glycopyrrolate  Physical Exam  Constitutional:  Resting in bed. No signs of distress. Irregular respirations.             Vital Signs: BP 112/60 (BP Location: Left Arm)   Pulse (!) 140   Temp (!) 101.2 F (38.4 C) (Oral)   Resp 12   Ht _0  (1.651 m)   Wt 90.7 kg (200 lb)   SpO2 (!) 87%   BMI 33.28 kg/m  SpO2: SpO2: (!) 87 % O2 Device: O2 Device: Nasal Cannula O2 Flow Rate: O2 Flow Rate (L/min): 3 L/min  Intake/output summary:   Intake/Output Summary (Last 24 hours) at 09/13/2017 1248 Last data filed at 09/13/2017 0400 Gross per 24 hour  Intake 307.08 ml  Output 1000 ml  Net -692.92 ml   LBM: Last BM Date: 09/09/17 Baseline Weight: Weight: 90.5 kg (199 lb 9.6 oz) Most recent weight: Weight: 90.7 kg  (200 lb)       Palliative Assessment/Data: 10%    Flowsheet Rows     Most Recent Value  Intake Tab  Referral Department  Hospitalist  Unit at Time of Referral  Med/Surg Unit  Palliative Care Primary Diagnosis  Sepsis/Infectious Disease  Date Notified  09/06/17  Palliative Care Type  Return patient Palliative Care  Reason for referral  Clarify  Goals of Care  Date of Admission  09/08/2017  Date first seen by Palliative Care  09/06/17  # of days Palliative referral response time  0 Day(s)  # of days IP prior to Palliative referral  5  Clinical Assessment  Psychosocial & Spiritual Assessment  Palliative Care Outcomes      Patient Active Problem List   Diagnosis Date Noted  . Osteomyelitis ([place]) 08/26/2017  . Pressure injury of skin 06/20/2017  . SIRS (systemic inflammatory response syndrome) ([place]) 06/17/2017  . Sepsis ([place]) 10/05/2016  . Acute encephalopathy 10/05/2016  . Diabetes mellitus with complication ([place])   . Complicated UTI (urinary tract infection)   . Acute cystitis without hematuria   . Morbid obesity ([place])   . UTI (urinary tract infection) 08/24/2016  . Altered mental status 08/24/2016  . Constipation 08/24/2016  . Hyperlipidemia 06/05/2015  . Type 2 diabetes mellitus with diabetic polyneuropathy ([place]) 06/05/2015  . Vitamin D deficiency 06/05/2015  . Recurrent UTI 04/17/2015  . Vaginal atrophy 04/17/2015  . Incontinence 04/17/2015  . OAB (overactive bladder) 03/03/2015  . Bipolar depression ([place]) 01/05/2015  . GERD (gastroesophageal reflux disease) 01/05/2015  . Allergic rhinitis 01/05/2015  . Essential hypertension 01/05/2015  . Candida infection 01/05/2015  . Parkinson's disease ([place]) 01/05/2015  . Neuropathy 01/05/2015  . Diabetes mellitus with neuropathy ([place]) 01/05/2015    Palliative Care Assessment & Plan   Patient Profile: [name] a82 y.o.femalewith a known history of Charcot's joint, GERD, HTn, DM, HLD, Obesity, neuropathy,  Osteomyelitis on foot- treated with doxy for 14 days in Sept 2018- sent to [facility], cont to have repeated Inf- also was given Rocephin and Clindamycin for last few days, still no improvement, so sent to ER.     Assessment: Resting in bed on [place].   Recommendations/Plan:  Anticipated hospital death.   Goals of Care and Additional Recommendations:  Limitations on Scope of Treatment: Full Comfort Care  Code Status:    Code Status Orders  (From admission, onward)        Start     Ordered   09/12/17 1620  DNR (Do not attempt resuscitation)  Continuous    Question Answer Comment  In the event of cardiac or respiratory ARREST Do not call a "code blue"   In the event of cardiac or respiratory ARREST Do not perform Intubation, CPR, defibrillation or ACLS   In the event of cardiac or respiratory ARREST Use medication by any route, position, wound care, and other measures to relive pain and suffering. May use oxygen, suction and manual treatment of airway obstruction as needed for comfort.   Comments full medical support short of ACLS. Also no dialysis of any form or any duration      09/12/17 1620    Code Status History    Date Active Date Inactive Code Status Order ID Comments User Context   09/10/2017 12:48 09/12/2017 16:20 DNR [id number]  Wilhelmina Mcardle, MD Inpatient   09/16/2017 19:48 09/10/2017 12:48 Full Code [id number]  Vaughan Basta, MD Inpatient   06/17/2017 15:52 06/24/2017 19:37 Full Code [id number]  Idelle Crouch, MD Inpatient   10/05/2016 12:17 10/09/2016 18:37 Full Code [id number]  Radene Gunning, NP ED   08/24/2016 20:19 08/27/2016 19:12 Full Code [id number]  Mariel Aloe, MD Inpatient    Advance Directive Documentation     Most Recent Value  Type of Advance Directive  Out of facility DNR (pink MOST or yellow form)  Pre-existing out of facility DNR order (  yellow form or pink MOST form)  Pink MOST form placed in chart (order not valid for inpatient use)  "MOST"  Form in Place?  No data       Prognosis:   Hours - Days  Discharge Planning:  Anticipated Hospital Death   Thank you for allowing the Palliative Medicine Team to assist in the care of this patient.   Total Time 35 min Prolonged Time Billed  No      Greater than 50%  of this time was spent counseling and coordinating care related to the above assessment and plan.  Asencion Gowda, NP  Please contact Palliative Medicine Team phone at [phone] for questions and concerns.

## 2017-09-13 NOTE — Progress Notes (Signed)
Nutrition Brief Note  Chart reviewed. Patient now transitioning to comfort care.   No further nutrition interventions warranted at this time. Please re-consult RD as needed.   Maureen Dyches, MS, RD, LDN Office: [phone] Pager: [phone] After Hours/Weekend Pager: [phone]  

## 2017-09-13 NOTE — Progress Notes (Signed)
[facility] - [place] at [facility]   PATIENT NAME: Joseph PieriniJessie Gilles    MR#:  [id number]  DATE OF BIRTH:  [birth year]  SUBJECTIVE:  Patient extubated yesterday and is now comfort care remains comfortable on a morphine drip  REVIEW OF SYSTEMS:    Unable to obtain   Sedated  DRUG ALLERGIES:   Allergies  Allergen Reactions  . Codeine Itching  . Morphine And Related Itching  . Indocin [Indomethacin] Rash    VITALS:  Blood pressure 112/60, pulse (!) 140, temperature (!) 101.2 F (38.4 C), temperature source Oral, resp. rate 12, height 5\' 5"  (1.651 m), weight 200 lb (90.7 kg), SpO2 (!) 87 %.  PHYSICAL EXAMINATION:  Constitutional: Patient comfortable Eyes: Conjunctivae not pale or no icterus. PERRLA,   Neck:  Neck supple. No JVD. No tracheal deviation. CVS:  S1/S2 +, no murmurs, no gallops, no carotid bruit.  Pulmonary: Remains intubated decreased breath sounds Abdominal: Soft. BS +,  no distension, tenderness, rebound or guarding.   Neuro: Sedated  Skin: left foot in cushion and right AKA dressed Psychiatric: Sedated    LABORATORY PANEL:   CBC Recent Labs  Lab 09/12/17 1044  WBC 13.1*  HGB 7.6*  HCT 23.8*  PLT 341   ------------------------------------------------------------------------------------------------------------------  Chemistries  Recent Labs  Lab 09/12/17 0426  NA 148*  K 4.4  CL 119*  CO2 23  GLUCOSE 190*  BUN 38*  CREATININE 1.57*  CALCIUM 8.4*  MG 2.0   ------------------------------------------------------------------------------------------------------------------  Cardiac Enzymes Recent Labs  Lab 09/10/17 1641 09/10/17 2034 09/11/17 2106  TROPONINI 2.42* 3.19* 1.48*   ------------------------------------------------------------------------------------------------------------------  RADIOLOGY:  Ct Head Wo Contrast  Result Date: 09/12/2017 CLINICAL DATA:  Patient status post cardiac arrest 09/10/2017. EXAM:  CT HEAD WITHOUT CONTRAST TECHNIQUE: Contiguous axial images were obtained from the base of the skull through the vertex without intravenous contrast. COMPARISON:  Head CT scan 07/12/2017. FINDINGS: Brain: Atrophy and chronic microvascular ischemic change are again seen. Remote lacunar infarction right basal ganglia is noted. No evidence of acute abnormality including infarct, hemorrhage, mass lesion, mass effect, midline shift or abnormal extra-axial fluid collection. No hydrocephalus or pneumocephalus. Vascular: Atherosclerosis noted. Skull: Intact. Sinuses/Orbits: Negative. Other: None. IMPRESSION: No acute abnormality. Atrophy and chronic microvascular ischemic change. Atherosclerosis. Electronically Signed   By: Drusilla Kannerhomas  Dalessio M.D.   On: 09/12/2017 12:27     ASSESSMENT AND PLAN:   82 year old female with a history of Charcot's joint, diabetes and obesity who presented due to concerns of osteomyelitis.  1.  Acute respiratory arrest Continue morphine drip for comfort care  2. Acute on chronic Osteomyelitis in the posterior calcaneus at the site of a large skin ulceration: Patient is status post right AKA Comfort care  3. Diabetes: Comfort care  4. Depression:  Comfort care  5. Parkinson's disease:  Comfort care   6. Acute on chronic anemia:  Comfort care  7.  CODE STATUS DNR comfort care    Overall poor prognosis Consider palliative care consult  CODE STATUS: full  CRITICAL CARE TOTAL TIME TAKING CARE OF THIS PATIENT: 35 minutes.         Auburn BilberryPATEL, Kristina Bertone M.D on 09/13/2017 at 3:35 PM  Between 7am to 6pm - Pager - [phone] After 6pm go to [email or web address] - password [email or web address]  [facility] [place] [facility]  Office  [phone]  CC: Primary care physician; Dorothey BasemanBronstein, David, MD  Note: This dictation was prepared with Dragon dictation along with smaller phrase technology. Any transcriptional errors that result from  this process are unintentional.

## 2017-09-14 DIAGNOSIS — J9690 Respiratory failure, unspecified, unspecified whether with hypoxia or hypercapnia: Secondary | ICD-10-CM

## 2017-09-14 MED ORDER — SODIUM CHLORIDE 0.9% FLUSH: 10.0000 mL | INTRAVENOUS | Status: DC | PRN

## 2017-09-14 MED ORDER — CHLORHEXIDINE GLUCONATE 0.12 % MT SOLN
15.0000 mL | Freq: Two times a day (BID) | OROMUCOSAL | Status: DC
  Administered 2017-09-14: 15 mL via OROMUCOSAL

## 2017-09-14 NOTE — Plan of Care (Signed)
  Progressing Pain Management: Satisfaction with pain management regimen will improve 09/14/2017 0911 - Progressing by Luretha MurphyMiles, Philemon Riedesel, RN

## 2017-09-14 NOTE — Progress Notes (Signed)
[facility] - [place] [facility]   PATIENT NAME: Maureen PieriniJessie Taylor    MR#:  [id number]  DATE OF BIRTH:  08-19-1935  SUBJECTIVE:  Patient with some shallow breathing today Continues to be on morphine 20 mg/h  REVIEW OF SYSTEMS:    Unable to obtain   Sedated  DRUG ALLERGIES:   Allergies  Allergen Reactions  . Codeine Itching  . Morphine And Related Itching  . Indocin [Indomethacin] Rash    VITALS:  Blood pressure (!) 97/41, pulse (!) 135, temperature (!) 103.5 F (39.7 C), temperature source Axillary, resp. rate 12, height 5\' 5"  (1.651 m), weight 200 lb (90.7 kg), SpO2 96 %.  PHYSICAL EXAMINATION:  Constitutional: Patient comfortable Eyes: Conjunctivae not pale or no icterus. PERRLA,   Neck:  Neck supple. No JVD. No tracheal deviation. CVS:  S1/S2 +, no murmurs, no gallops, no carotid bruit.  Pulmonary: Remains intubated decreased breath sounds Abdominal: Soft. BS +,  no distension, tenderness, rebound or guarding.   Neuro: Sedated  Skin: left foot in cushion and right AKA dressed Psychiatric: Sedated    LABORATORY PANEL:   CBC Recent Labs  Lab 09/12/17 1044  WBC 13.1*  HGB 7.6*  HCT 23.8*  PLT 341   ------------------------------------------------------------------------------------------------------------------  Chemistries  Recent Labs  Lab 09/12/17 0426  NA 148*  K 4.4  CL 119*  CO2 23  GLUCOSE 190*  BUN 38*  CREATININE 1.57*  CALCIUM 8.4*  MG 2.0   ------------------------------------------------------------------------------------------------------------------  Cardiac Enzymes Recent Labs  Lab 09/10/17 1641 09/10/17 2034 09/11/17 2106  TROPONINI 2.42* 3.19* 1.48*   ------------------------------------------------------------------------------------------------------------------  RADIOLOGY:  No results found.   ASSESSMENT AND PLAN:   82 year old female with a history of Charcot's joint, diabetes and obesity  who presented due to concerns of osteomyelitis.  1.  Acute respiratory arrest Continue morphine drip for comfort care  2. Acute on chronic Osteomyelitis in the posterior calcaneus at the site of a large skin ulceration: Patient is status post right AKA Comfort care  3. Diabetes: Comfort care  4. Depression:  Comfort care  5. Parkinson's disease:  Comfort care   6. Acute on chronic anemia:  Comfort care  7.  CODE STATUS DNR comfort care    Overall poor prognosis Consider palliative care consult  CODE STATUS: full  CRITICAL CARE TOTAL TIME TAKING CARE OF THIS PATIENT: 35 minutes.         Auburn BilberryPATEL, Sade Mehlhoff M.D on 09/14/2017 at 2:54 PM  Between 7am to 6pm - Pager - [phone] After 6pm go to [email or web address] - password [facility]  [facility] [place] [facility]  Office  [phone]  CC: Primary care physician; Dorothey BasemanBronstein, David, MD  Note: This dictation was prepared with Dragon dictation along with smaller phrase technology. Any transcriptional errors that result from this process are unintentional.

## 2017-09-14 NOTE — Progress Notes (Signed)
Daily Progress Note   Patient Name: Maureen Taylor       Date: 09/14/2017 DOB: 1935/05/24  Age: 82 y.o. MRN#: [id number] Attending Physician: Auburn BilberryPatel, Shreyang, MD Primary Care Physician: Dorothey BasemanBronstein, David, MD Admit Date: 08/29/2017  Reason for Consultation/Follow-up: Establishing goals of care  Subjective: Maureen Taylor is resting in bed, eyes closed. Irregular, shallow respirations. No distress noted. Support offered to family at bedside. No medication changes at this time.   Length of Stay: 13  Current Medications: Scheduled Meds:  . chlorhexidine  15 mL Mouth/Throat BID  . mouth rinse  15 mL Mouth Rinse 10 times per day  . multivitamin  15 mL Per Tube Daily  . polyvinyl alcohol  1 drop Both Eyes TID  . scopolamine  1 patch Transdermal Q72H    Continuous Infusions: . morphine 20 mg/hr (09/14/17 0857)    PRN Meds: acetaminophen, bisacodyl, glycopyrrolate  Physical Exam  Constitutional: No distress.  Pulmonary/Chest:  Shallow and irregular.            Vital Signs: BP (!) 97/41 (BP Location: Right Arm)   Pulse (!) 135   Temp (!) 103.5 F (39.7 C) (Axillary)   Resp 12   Ht 5\' 5"  (1.651 m)   Wt 90.7 kg (200 lb)   SpO2 96%   BMI 33.28 kg/m  SpO2: SpO2: 96 % O2 Device: O2 Device: Not Delivered O2 Flow Rate: O2 Flow Rate (L/min): 3 L/min  Intake/output summary:   Intake/Output Summary (Last 24 hours) at 09/14/2017 1351 Last data filed at 09/14/2017 [id number] Gross per 24 hour  Intake -  Output 300 ml  Net -300 ml   LBM: Last BM Date: 09/09/17 Baseline Weight: Weight: 90.5 kg (199 lb 9.6 oz) Most recent weight: Weight: 90.7 kg (200 lb)       Palliative Assessment/Data: 10%    Flowsheet Rows     Most Recent Value  Intake Tab  Referral Department  Hospitalist    Unit at Time of Referral  Med/Surg Unit  Palliative Care Primary Diagnosis  Sepsis/Infectious Disease  Date Notified  09/06/17  Palliative Care Type  Return patient Palliative Care  Reason for referral  Clarify Goals of Care  Date of Admission  08/31/2017  Date first seen by Palliative Care  09/06/17  # of days Palliative referral response  time  0 Day(s)  # of days IP prior to Palliative referral  5  Clinical Assessment  Psychosocial & Spiritual Assessment  Palliative Care Outcomes      Patient Active Problem List   Diagnosis Date Noted  . Osteomyelitis (HCC) 09/18/2017  . Pressure injury of skin 06/20/2017  . SIRS (systemic inflammatory response syndrome) (HCC) 06/17/2017  . Sepsis (HCC) 10/05/2016  . Acute encephalopathy 10/05/2016  . Diabetes mellitus with complication (HCC)   . Complicated UTI (urinary tract infection)   . Acute cystitis without hematuria   . Morbid obesity (HCC)   . UTI (urinary tract infection) 08/24/2016  . Altered mental status 08/24/2016  . Constipation 08/24/2016  . Hyperlipidemia 06/05/2015  . Type 2 diabetes mellitus with diabetic polyneuropathy (HCC) 06/05/2015  . Vitamin D deficiency 06/05/2015  . Recurrent UTI 04/17/2015  . Vaginal atrophy 04/17/2015  . Incontinence 04/17/2015  . OAB (overactive bladder) 03/03/2015  . Bipolar depression (HCC) 01/05/2015  . GERD (gastroesophageal reflux disease) 01/05/2015  . Allergic rhinitis 01/05/2015  . Essential hypertension 01/05/2015  . Candida infection 01/05/2015  . Parkinson's disease (HCC) 01/05/2015  . Neuropathy 01/05/2015  . Diabetes mellitus with neuropathy (HCC) 01/05/2015    Palliative Care Assessment & Plan   Patient Profile: Maureen Taylor a82 y.o.femalewith a known history of Charcot's joint, GERD, HTn, DM, HLD, Obesity, neuropathy, Osteomyelitis on foot- treated with doxy for 14 days in Sept 2018- sent to [facility], cont to have repeated Inf- also was given Rocephin and  Clindamycin for last few days, still no improvement, so sent to ER.     Assessment: Resting in bed. Family at bedside.   Recommendations/Plan:  Continue current treatment plan.   Goals of Care and Additional Recommendations:  Limitations on Scope of Treatment: Full Comfort Care  Code Status:    Code Status Orders  (From admission, onward)        Start     Ordered   09/12/17 1620  DNR (Do not attempt resuscitation)  Continuous    Question Answer Comment  In the event of cardiac or respiratory ARREST Do not call a "code blue"   In the event of cardiac or respiratory ARREST Do not perform Intubation, CPR, defibrillation or ACLS   In the event of cardiac or respiratory ARREST Use medication by any route, position, wound care, and other measures to relive pain and suffering. May use oxygen, suction and manual treatment of airway obstruction as needed for comfort.   Comments full medical support short of ACLS. Also no dialysis of any form or any duration      09/12/17 1620    Code Status History    Date Active Date Inactive Code Status Order ID Comments User Context   09/10/2017 12:48 09/12/2017 16:20 DNR [id number]  Merwyn Katos, MD Inpatient   09/11/2017 19:48 09/10/2017 12:48 Full Code [id number]  Altamese Dilling, MD Inpatient   06/17/2017 15:52 06/24/2017 19:37 Full Code [id number]  Marguarite Arbour, MD Inpatient   10/05/2016 12:17 10/09/2016 18:37 Full Code [id number]  Gwenyth Bender, NP ED   08/24/2016 20:19 08/27/2016 19:12 Full Code [id number]  Narda Bonds, MD Inpatient    Advance Directive Documentation     Most Recent Value  Type of Advance Directive  Out of facility DNR (pink MOST or yellow form)  Pre-existing out of facility DNR order (yellow form or pink MOST form)  Pink MOST form placed in chart (order not valid for inpatient use)  "MOST" Form in  Place?  No data       Prognosis:   Hours - Days  Discharge Planning:  Anticipated Hospital  Death    Thank you for allowing the Palliative Medicine Team to assist in the care of this patient.   Total Time 25 min Prolonged Time Billed  no      Greater than 50%  of this time was spent counseling and coordinating care related to the above assessment and plan.  Morton Stallrystal Jennise Both, NP  Please contact Palliative Medicine Team phone at [phone] for questions and concerns.

## 2017-09-26 NOTE — Plan of Care (Signed)
Discussed with family plan of care. Pt. Seemed comfortable and pain free during assessment through out the evening, relaxed and intense in body.  Family notified following pronouncement of death. Answered families questions on process of care following death. Once family questions resolved; Family informed to take as much time as needed with deceased.

## 2017-09-26 NOTE — Death Summary Note (Signed)
[facility] Date of Admission: 09/13/2017  3:43 PM  Date of death:   Admitting diagnosis:  osteomyeltis    Diagnosis at time of death 1.  Acute hypoxic respiratory failure 2.  Acute on chronic osteomyelitis status post amputation 3.  Diabetes type 2 4.  Relative hypotension 5.  Hyperlipidemia unspecified 6.  Parkinson's disease 7.  Bipolar disorder     Hospital course Patient was a 83 year old female who presented with right foot osteomyelitis too severe to salvage the foot.  She was initially treated with antibiotics.  Subsequently decided by vascular surgery for a right above-the-knee amputation.  Patient underwent surgery.  Post surgery patient developed acute respiratory failure requiring intubation.  Patient was in the ICU on the ventilator she did not improve.  Further discussions were held with the family and she was made comfort care and terminally extubated.  Patient was kept on morphine drip for comfort and she passed away on the morning of 02/26/17.          TOTAL TIME TAKING CARE OF THIS PATIENT: 35 minutes.    Auburn BilberryPATEL, Truman Aceituno M.D on 02/26/17 at 8:13 AM  Between 7am to 6pm - Pager - [phone]  After 6pm go to [email or web address] - password [email or web address] [facility]  [facility]  Office  [phone]  CC: Primary care physician; Dorothey BasemanBronstein, David, MD

## 2017-09-26 DEATH — deceased

## 2018-07-20 ENCOUNTER — Ambulatory Visit: Payer: Medicare Other

## 2018-09-26 IMAGING — CT CT HEAD W/O CM
6 of 11 series · 15 of 47 positions shown, 17 images · non-contrast
Comparison: CT head and cervical spine 08/19/2016

CLINICAL DATA: Status post fall with right-sided headache

EXAM:
CT HEAD WITHOUT CONTRAST
CT MAXILLOFACIAL WITHOUT CONTRAST
CT CERVICAL SPINE WITHOUT CONTRAST
TECHNIQUE: Multidetector CT imaging of the head, cervical spine, and
maxillofacial structures were performed using the standard protocol
without intravenous contrast. Multiplanar CT image reconstructions
of the cervical spine and maxillofacial structures were also
generated.

[Series 3: head wo · axial · 0.43mm/px · z∈[+1559,+1609]mm · 2 of 32 slices shown]
[im 11/32  brain]
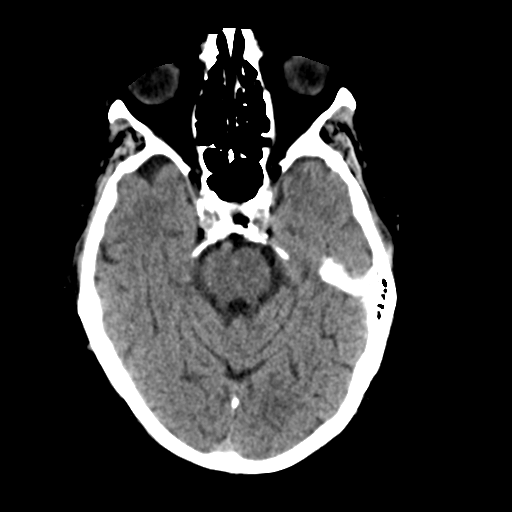
[im 21/32  brain]
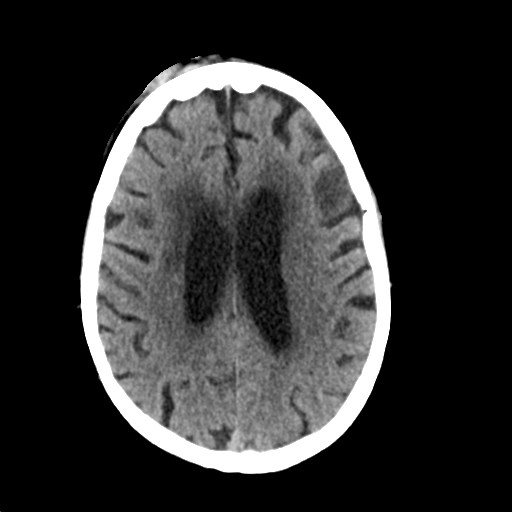

[Series 4: head bone · axial · 0.43mm/px · z∈[+1539,+1571]mm · 2 of 79 slices shown]
[im 16/79  bone]
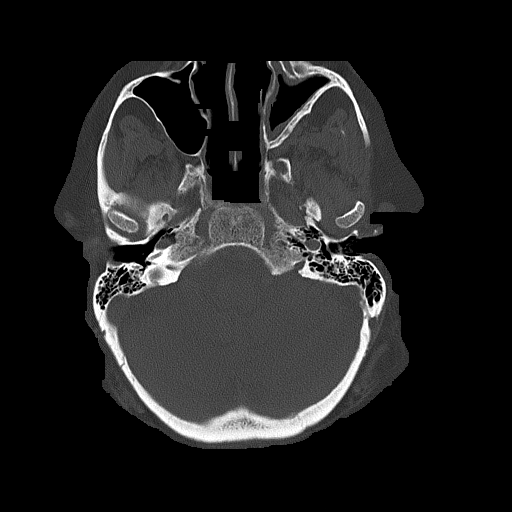
[im 32/79  bone]
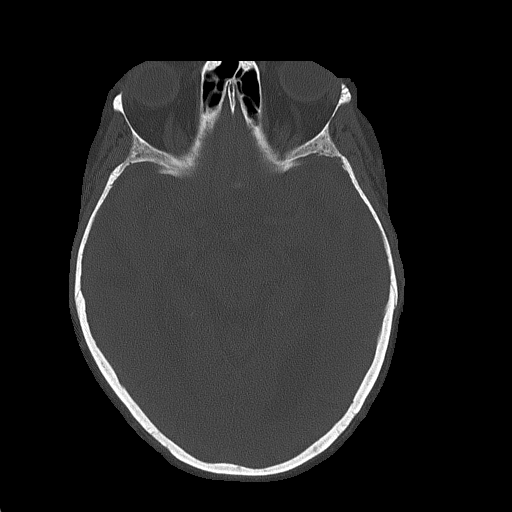

[Series 5: coronal soft tissue · coronal · 0.27mm/px · 1 of 67 slices shown]
[im 34/67  brain]
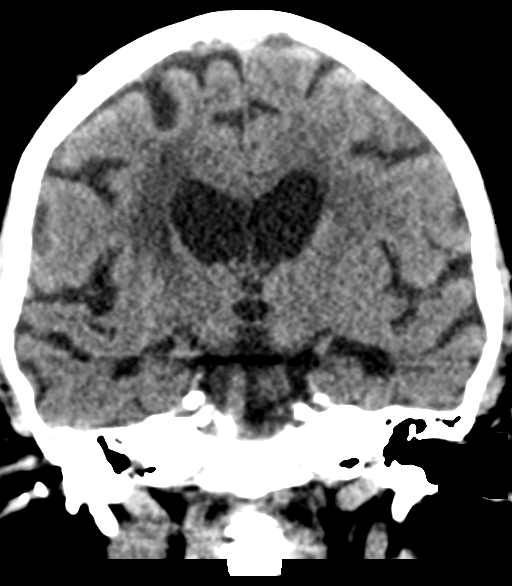

[Series 11: orthogonal axials · axial · 0.20mm/px · z∈[+1367,+1481]mm · 5 of 91 slices shown, 7 images]
[im 16/91  brain]
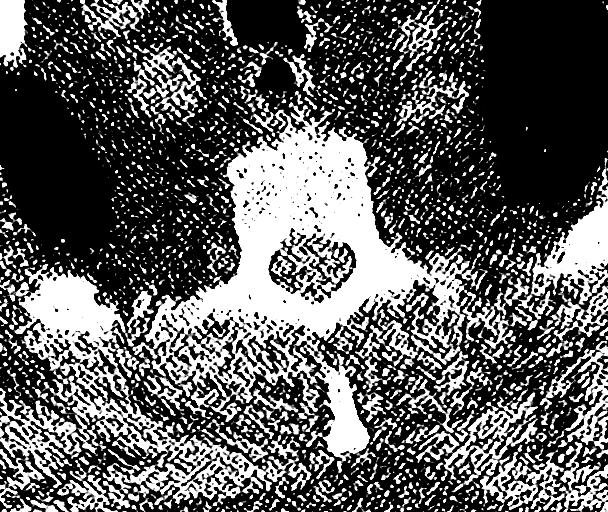
[im 16/91  bone]
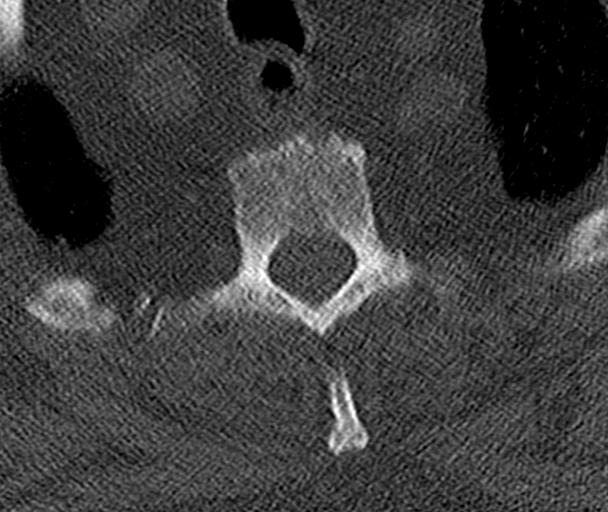
[im 31/91  brain]
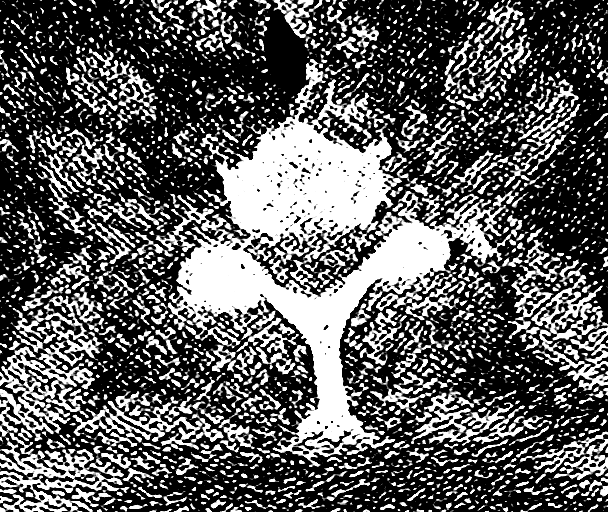
[im 46/91  brain]
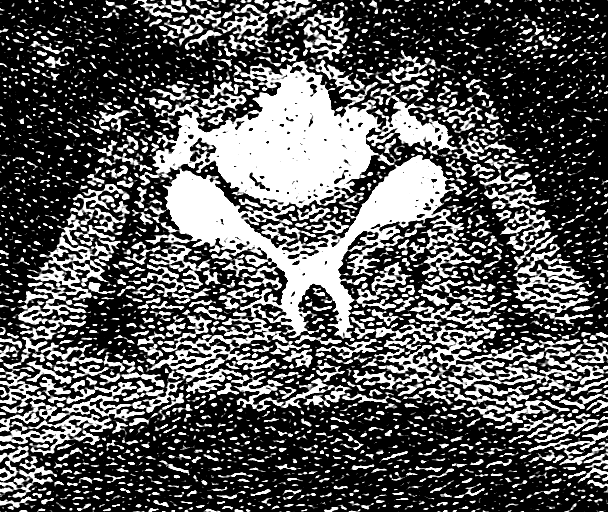
[im 61/91  brain]
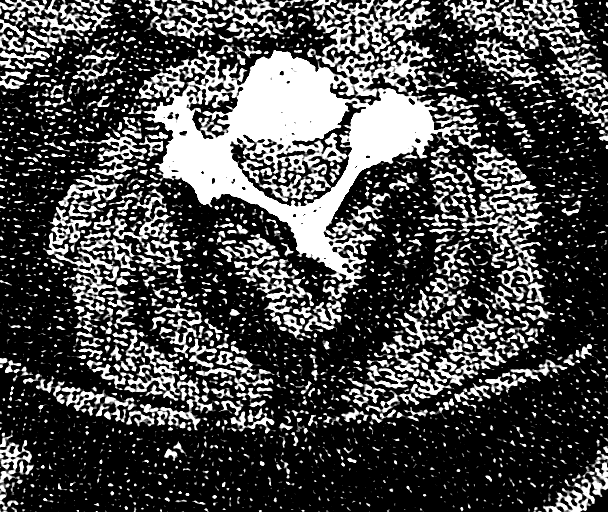
[im 76/91  brain]
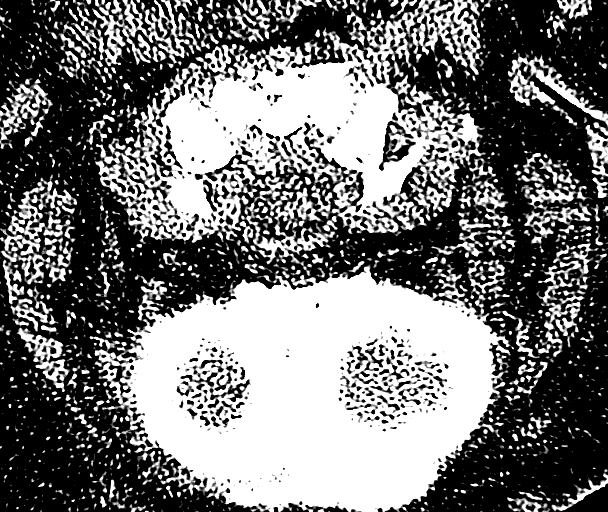
[im 76/91  bone]
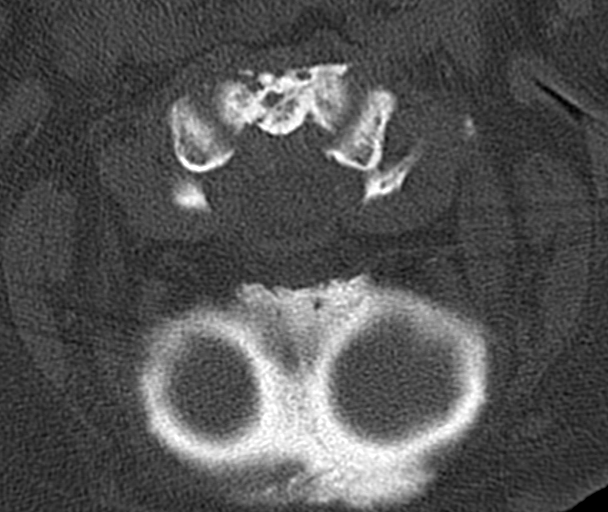

[Series 12: max soft · axial · 0.35mm/px · z∈[+1478,+1572]mm · 4 of 79 slices shown]
[im 16/79  brain]
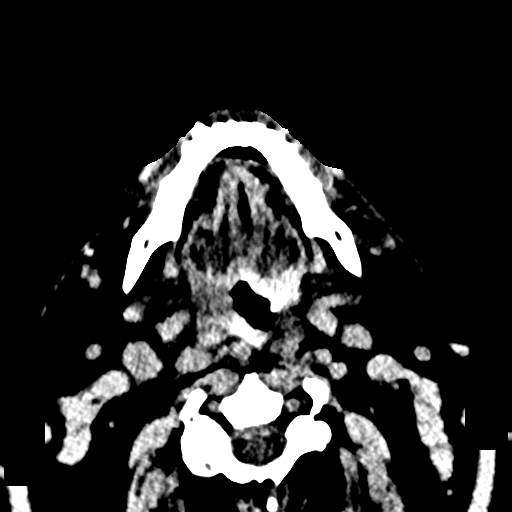
[im 32/79  brain]
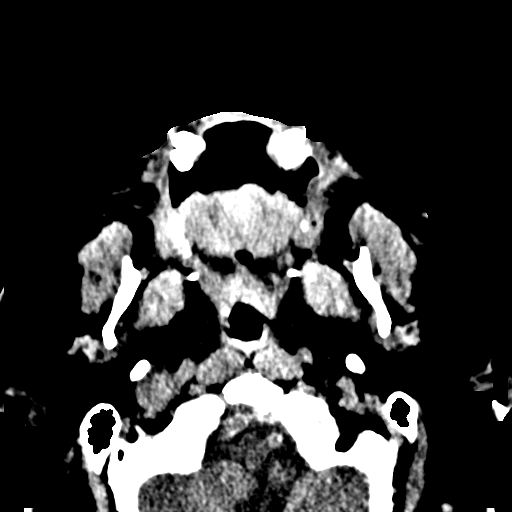
[im 47/79  brain]
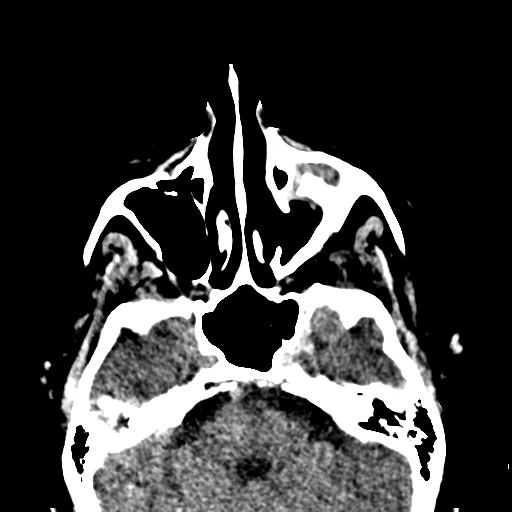
[im 63/79  brain]
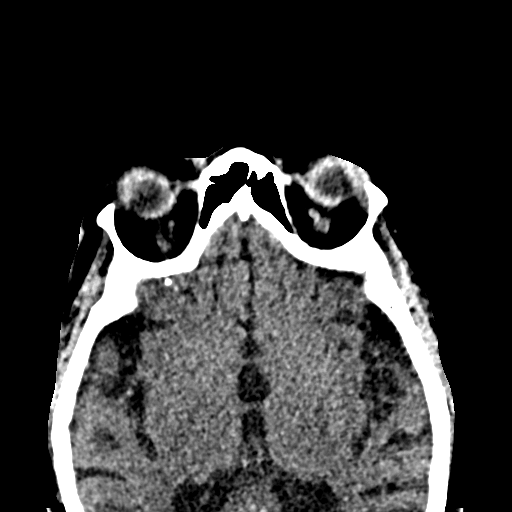

[Series 15: sagittal soft · sagittal · 0.31mm/px · 1 of 83 slices shown]
[im 42/83  brain]
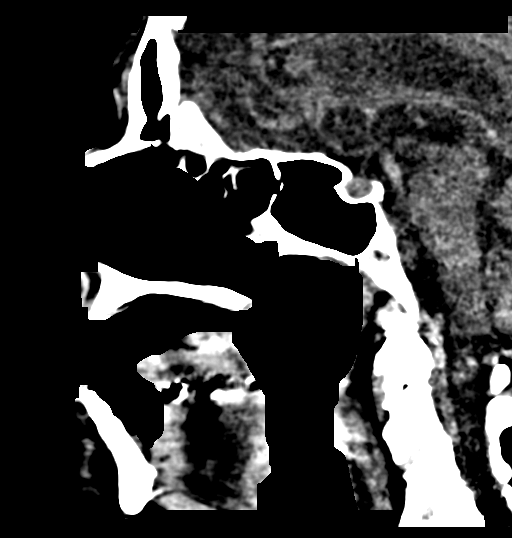

[15 of 47 positions shown; findings below may reference images not displayed]

FINDINGS: CT HEAD FINDINGS

Brain: No mass lesion, intraparenchymal hemorrhage or extra-axial
collection. No evidence of acute cortical infarct. There is
periventricular hypoattenuation compatible with chronic
microvascular disease. Old right centrum semiovale lacunar infarct.

Vascular: Mild carotid and vertebral artery calcification at the
skullbase.

Skull: No skull fracture identified. Medium-sized right frontal
scalp hematoma/laceration.

CT MAXILLOFACIAL FINDINGS

Osseous:

--Complex facial fracture types: No [place], zygomaticomaxillary
complex or nasoorbitoethmoidal fracture.

--Simple fracture types: None.

--Mandible: No fracture or dislocation.

Orbits: The globes appear intact. Normal appearance of the intra-
and extraconal fat. Symmetric extraocular muscles.

Sinuses: No fluid levels or advanced mucosal thickening.

Soft tissues: Normal visualized extracranial facial soft tissues.

CT CERVICAL SPINE FINDINGS

Alignment: No acute static subluxation. Facets are aligned.
Occipital condyles are normally positioned. Unchanged grade 1 C4-5
anterolisthesis.

Skull base and vertebrae: No acute fracture.

Soft tissues and spinal canal: No prevertebral fluid or swelling. No
visible canal hematoma.

Disc levels: Multilevel facet hypertrophy and fusion at the C5-T1
levels. No osseous spinal canal stenosis.

Upper chest: No pneumothorax, pulmonary nodule or pleural effusion.

Other: There is a left level 3 lymph node measuring 1.1 cm, new from
the prior study (series 8, image 42). Enlarged lymph nodes are also
noted right level 2.
IMPRESSION: 1. No acute intracranial abnormality.
2. Medium-sized right frontal scalp laceration/hematoma. No
underlying skull fracture or facial fracture.
3. No acute fracture or static subluxation of the cervical spine.
4. New cervical lymphadenopathy compared to 08/19/2016, of uncertain
significance.

## 2018-09-27 IMAGING — CR DG CHEST 2V
2 series · 2 of 2 positions shown · non-contrast
Comparison: Chest radiograph from 05/30/2014

CLINICAL DATA: Status post fall, with decreased O2 saturation.
Initial encounter.

EXAM:
CHEST  2 VIEW

[w chest lat]
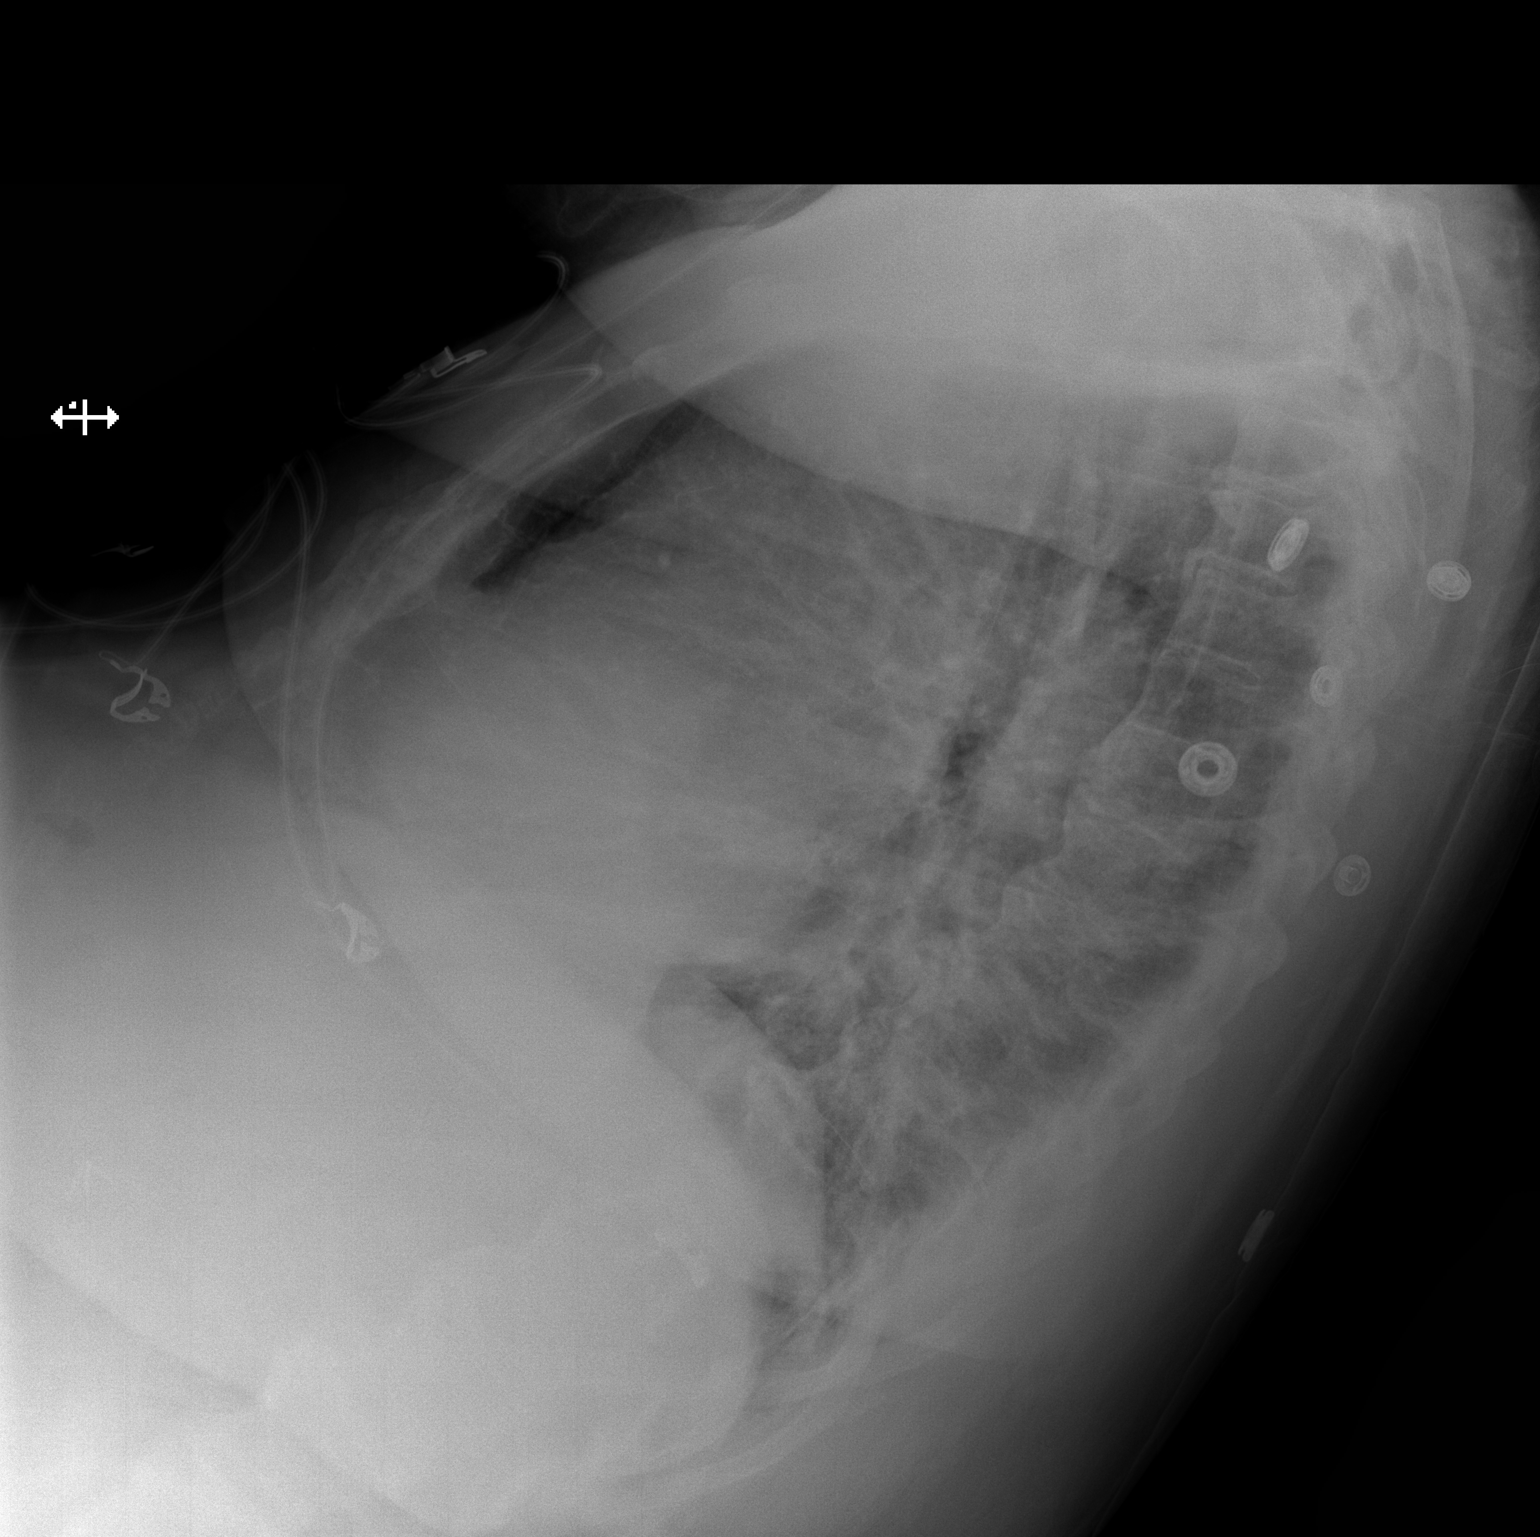

[x chest ap]
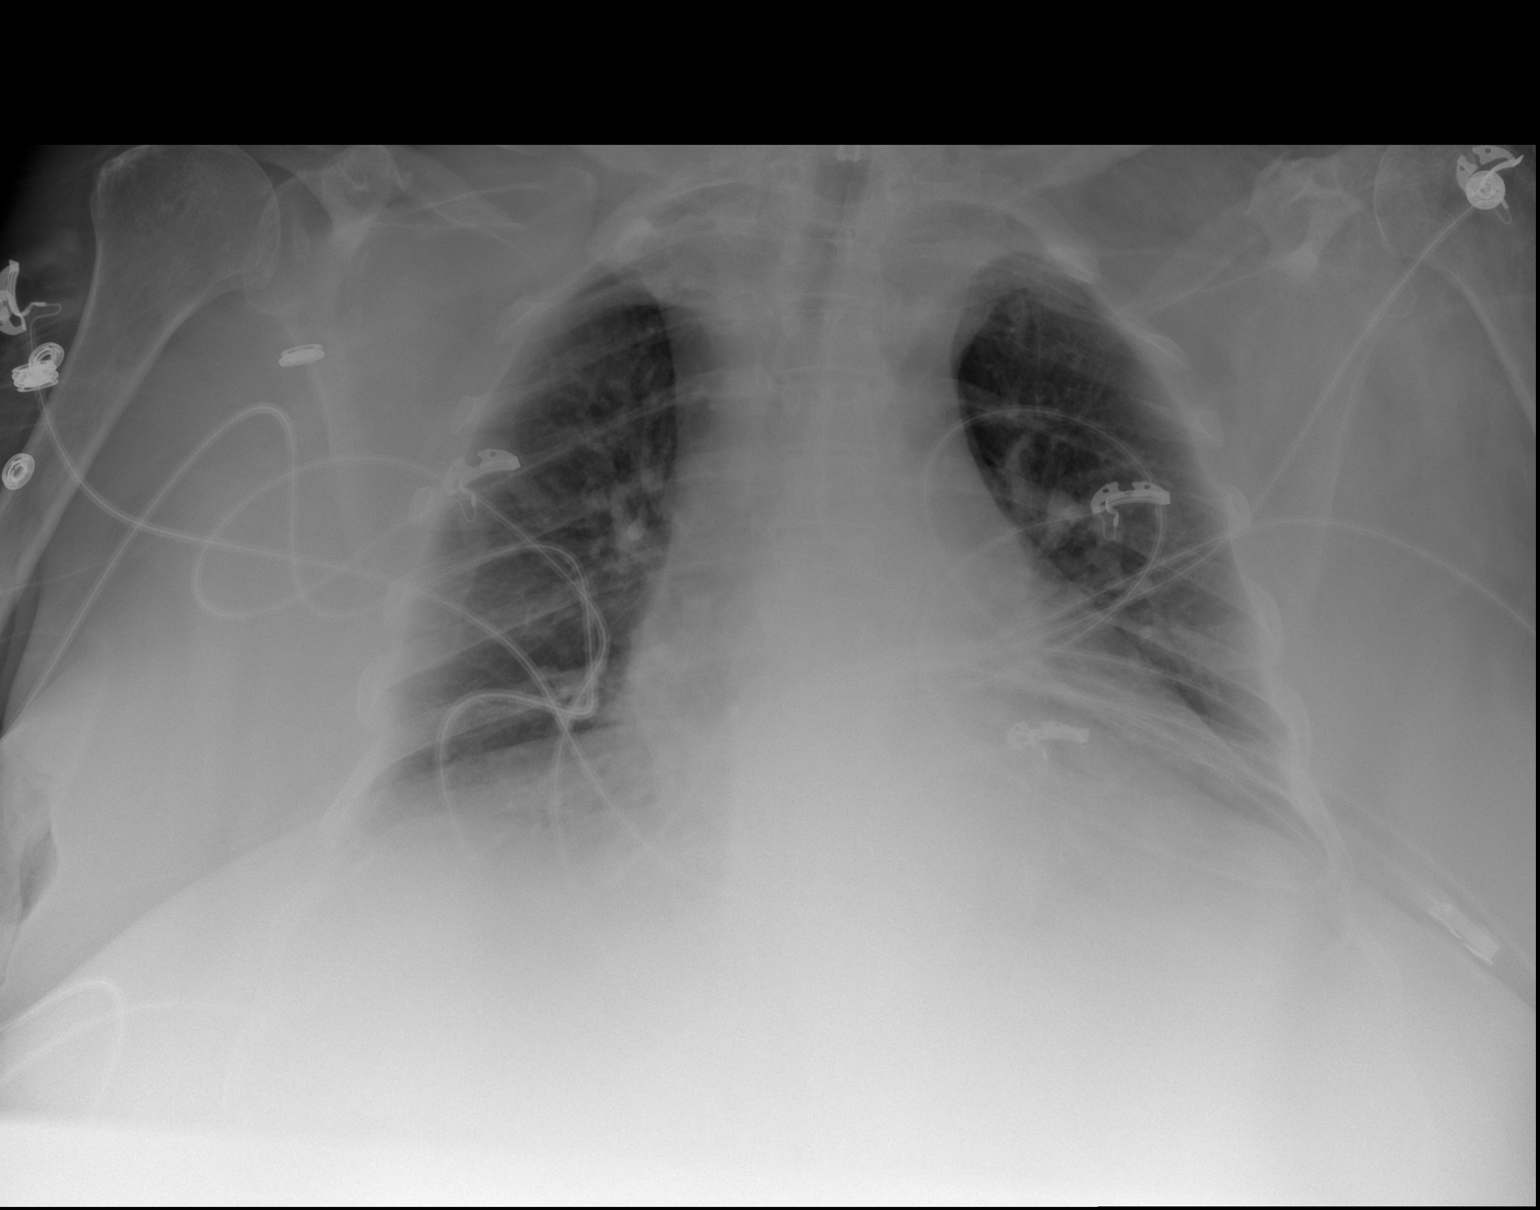

[2 of 2 positions shown; findings below may reference images not displayed]

FINDINGS: The lungs are hypoexpanded. Vascular congestion is noted. There is
no evidence of focal opacification, pleural effusion or
pneumothorax.

The heart is mildly enlarged. No acute osseous abnormalities are
seen.
IMPRESSION: Lungs hypoexpanded. Vascular congestion and mild cardiomegaly. No
displaced rib fractures identified.

## 2018-09-27 IMAGING — DX DG CHEST 1V PORT
1 series · 1 of 1 positions shown · non-contrast
Comparison: 08/24/2016

CLINICAL DATA: Fever with multiple falls last week.

EXAM:
PORTABLE CHEST 1 VIEW

[chest ap]
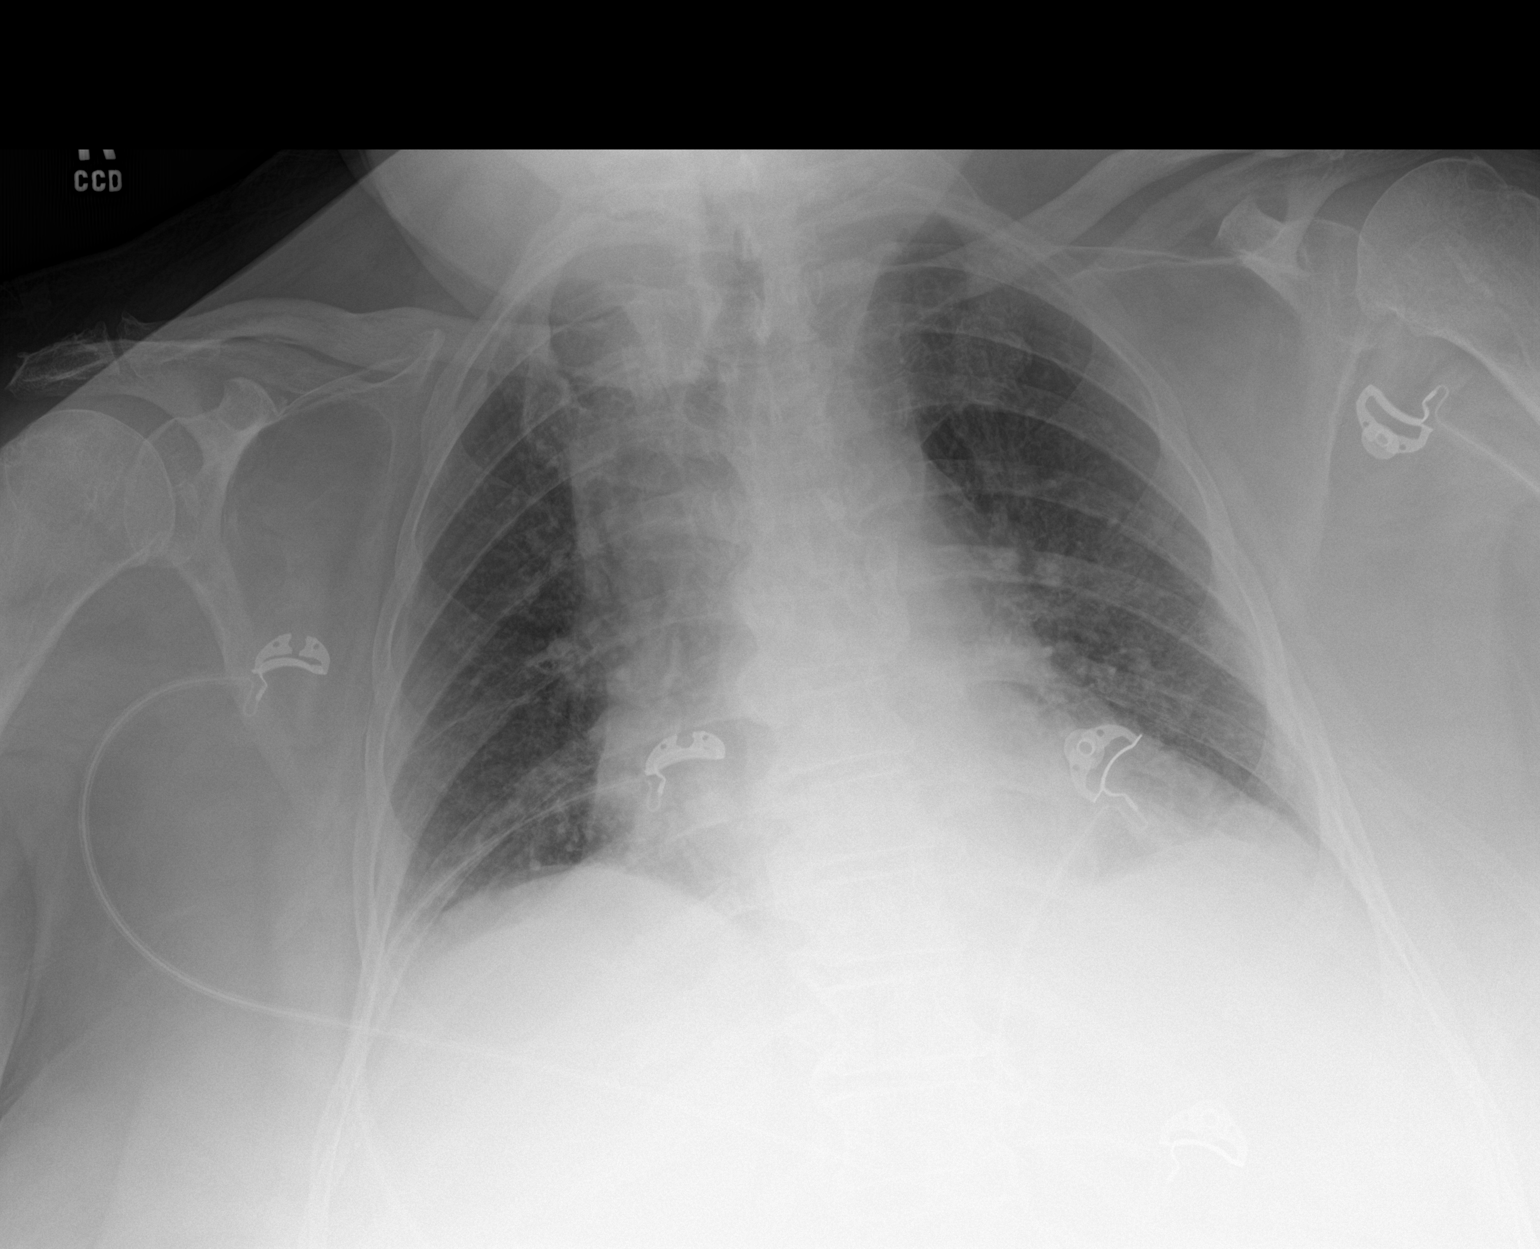

[1 of 1 positions shown; findings below may reference images not displayed]

FINDINGS: Cardiomegaly. Low lung volumes with bibasilar atelectasis. No
visible effusions or acute bony abnormality.
IMPRESSION: Cardiomegaly.  Bibasilar atelectasis.
